# Patient Record
Sex: Male | Born: 1955 | Race: Black or African American | Hispanic: No | Smoking: Former smoker
Health system: Southern US, Community
[De-identification: ages and names within clinical notes are randomized; demographics above are authoritative.]

## PROBLEM LIST (undated history)

## (undated) DIAGNOSIS — I10 Essential (primary) hypertension: Secondary | ICD-10-CM

## (undated) DIAGNOSIS — J449 Chronic obstructive pulmonary disease, unspecified: Secondary | ICD-10-CM

## (undated) DIAGNOSIS — I720 Aneurysm of carotid artery: Secondary | ICD-10-CM

## (undated) DIAGNOSIS — I509 Heart failure, unspecified: Secondary | ICD-10-CM

## (undated) DIAGNOSIS — S0592XA Unspecified injury of left eye and orbit, initial encounter: Secondary | ICD-10-CM

## (undated) DIAGNOSIS — N182 Chronic kidney disease, stage 2 (mild): Secondary | ICD-10-CM

## (undated) DIAGNOSIS — L409 Psoriasis, unspecified: Secondary | ICD-10-CM

## (undated) DIAGNOSIS — R0600 Dyspnea, unspecified: Secondary | ICD-10-CM

## (undated) DIAGNOSIS — G4730 Sleep apnea, unspecified: Secondary | ICD-10-CM

## (undated) DIAGNOSIS — M4807 Spinal stenosis, lumbosacral region: Secondary | ICD-10-CM

## (undated) DIAGNOSIS — R51 Headache: Secondary | ICD-10-CM

## (undated) DIAGNOSIS — N529 Male erectile dysfunction, unspecified: Secondary | ICD-10-CM

## (undated) DIAGNOSIS — E669 Obesity, unspecified: Secondary | ICD-10-CM

## (undated) DIAGNOSIS — N289 Disorder of kidney and ureter, unspecified: Secondary | ICD-10-CM

## (undated) DIAGNOSIS — J45909 Unspecified asthma, uncomplicated: Secondary | ICD-10-CM

## (undated) HISTORY — DX: Male erectile dysfunction, unspecified: N52.9

## (undated) HISTORY — DX: Unspecified injury of left eye and orbit, initial encounter: S05.92XA

## (undated) HISTORY — DX: Headache: R51

## (undated) HISTORY — DX: Chronic kidney disease, stage 2 (mild): N18.2

## (undated) HISTORY — DX: Psoriasis, unspecified: L40.9

## (undated) HISTORY — DX: Dyspnea, unspecified: R06.00

## (undated) HISTORY — DX: Spinal stenosis, lumbosacral region: M48.07

## (undated) HISTORY — DX: Obesity, unspecified: E66.9

## (undated) HISTORY — DX: Chronic obstructive pulmonary disease, unspecified: J44.9

## (undated) HISTORY — DX: Essential (primary) hypertension: I10

---
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
## 2002-08-16 ENCOUNTER — Emergency Department (HOSPITAL_COMMUNITY): Admission: EM | Admit: 2002-08-16 | Discharge: 2002-08-17 | Payer: Self-pay | Admitting: Emergency Medicine

## 2003-12-23 ENCOUNTER — Emergency Department (HOSPITAL_COMMUNITY): Admission: EM | Admit: 2003-12-23 | Discharge: 2003-12-23 | Payer: Self-pay | Admitting: Emergency Medicine

## 2004-06-15 ENCOUNTER — Emergency Department (HOSPITAL_COMMUNITY): Admission: EM | Admit: 2004-06-15 | Discharge: 2004-06-15 | Payer: Self-pay | Admitting: Emergency Medicine

## 2005-12-11 ENCOUNTER — Emergency Department (HOSPITAL_COMMUNITY): Admission: EM | Admit: 2005-12-11 | Discharge: 2005-12-11 | Payer: Self-pay | Admitting: Emergency Medicine

## 2005-12-12 ENCOUNTER — Ambulatory Visit: Payer: Self-pay | Admitting: Orthopedic Surgery

## 2005-12-12 ENCOUNTER — Inpatient Hospital Stay (HOSPITAL_COMMUNITY): Admission: AD | Admit: 2005-12-12 | Discharge: 2005-12-15 | Payer: Self-pay | Admitting: Orthopedic Surgery

## 2005-12-14 ENCOUNTER — Encounter: Payer: Self-pay | Admitting: Orthopedic Surgery

## 2005-12-14 HISTORY — PX: ORIF PATELLA: SHX5033

## 2005-12-15 ENCOUNTER — Ambulatory Visit: Payer: Self-pay | Admitting: Orthopedic Surgery

## 2005-12-23 ENCOUNTER — Ambulatory Visit: Payer: Self-pay | Admitting: Orthopedic Surgery

## 2006-01-20 ENCOUNTER — Ambulatory Visit: Payer: Self-pay | Admitting: Orthopedic Surgery

## 2006-05-01 ENCOUNTER — Ambulatory Visit: Payer: Self-pay | Admitting: Orthopedic Surgery

## 2006-08-14 ENCOUNTER — Ambulatory Visit: Payer: Self-pay | Admitting: Orthopedic Surgery

## 2006-11-17 ENCOUNTER — Ambulatory Visit: Payer: Self-pay | Admitting: Orthopedic Surgery

## 2007-09-07 ENCOUNTER — Ambulatory Visit: Payer: Self-pay | Admitting: Orthopedic Surgery

## 2007-09-07 DIAGNOSIS — M23302 Other meniscus derangements, unspecified lateral meniscus, unspecified knee: Secondary | ICD-10-CM | POA: Insufficient documentation

## 2007-09-07 DIAGNOSIS — M545 Low back pain: Secondary | ICD-10-CM

## 2007-09-16 ENCOUNTER — Ambulatory Visit (HOSPITAL_COMMUNITY): Admission: RE | Admit: 2007-09-16 | Discharge: 2007-09-16 | Payer: Self-pay | Admitting: Orthopedic Surgery

## 2007-09-25 ENCOUNTER — Emergency Department (HOSPITAL_COMMUNITY): Admission: EM | Admit: 2007-09-25 | Discharge: 2007-09-25 | Payer: Self-pay | Admitting: Emergency Medicine

## 2007-09-28 ENCOUNTER — Ambulatory Visit: Payer: Self-pay | Admitting: Orthopedic Surgery

## 2007-11-29 ENCOUNTER — Inpatient Hospital Stay (HOSPITAL_COMMUNITY): Admission: EM | Admit: 2007-11-29 | Discharge: 2007-12-01 | Payer: Self-pay | Admitting: Emergency Medicine

## 2008-09-05 ENCOUNTER — Encounter (INDEPENDENT_AMBULATORY_CARE_PROVIDER_SITE_OTHER): Payer: Self-pay | Admitting: *Deleted

## 2008-09-05 ENCOUNTER — Observation Stay (HOSPITAL_COMMUNITY): Admission: EM | Admit: 2008-09-05 | Discharge: 2008-09-06 | Payer: Self-pay | Admitting: Emergency Medicine

## 2008-09-06 ENCOUNTER — Ambulatory Visit: Payer: Self-pay | Admitting: Gastroenterology

## 2009-09-06 LAB — CONVERTED CEMR LAB
BUN: 17 mg/dL
Creatinine, Ser: 1.5 mg/dL
TSH: 0.84 microintl units/mL

## 2009-09-12 ENCOUNTER — Ambulatory Visit: Payer: Self-pay | Admitting: Specialist

## 2010-01-25 LAB — CONVERTED CEMR LAB
ALT: 18 units/L
AST: 19 units/L
Albumin: 4 g/dL
Alkaline Phosphatase: 68 units/L
BUN: 13 mg/dL
CO2: 24 meq/L
Calcium: 9.4 mg/dL
Chloride: 102 meq/L
Cholesterol: 172 mg/dL
Creatinine, Ser: 1.27 mg/dL
GFR calc non Af Amer: 63 mL/min
Glomerular Filtration Rate, Af Am: 73 mL/min/{1.73_m2}
Glucose, Bld: 95 mg/dL
HDL: 51 mg/dL
LDL Cholesterol: 101 mg/dL
Potassium: 3.7 meq/L
Sodium: 141 meq/L
Total Protein: 6.7 g/dL
Triglycerides: 101 mg/dL

## 2010-01-26 ENCOUNTER — Ambulatory Visit (HOSPITAL_COMMUNITY)
Admission: RE | Admit: 2010-01-26 | Discharge: 2010-01-26 | Payer: Self-pay | Source: Home / Self Care | Admitting: Nurse Practitioner

## 2010-03-06 LAB — CONVERTED CEMR LAB
Basophils Absolute: 0 10*3/uL
Basophils Relative: 1 %
Eosinophils Absolute: 0.3 10*3/uL
Eosinophils Relative: 6 %
HCT: 42.2 %
Hemoglobin: 14.1 g/dL
Lymphocytes Relative: 32 %
Lymphs Abs: 1.8 10*3/uL
MCHC: 33.4 g/dL
MCV: 91 fL
Monocytes Absolute: 0.4 10*3/uL
Monocytes Relative: 7 %
Neutro Abs: 3 10*3/uL
Platelets: 273 10*3/uL
RBC: 4.62 M/uL
RDW: 13.3 %
WBC: 5.6 10*3/uL

## 2010-03-07 DIAGNOSIS — F17200 Nicotine dependence, unspecified, uncomplicated: Secondary | ICD-10-CM | POA: Insufficient documentation

## 2010-03-07 DIAGNOSIS — J4489 Other specified chronic obstructive pulmonary disease: Secondary | ICD-10-CM | POA: Insufficient documentation

## 2010-03-07 DIAGNOSIS — L408 Other psoriasis: Secondary | ICD-10-CM

## 2010-03-07 DIAGNOSIS — N529 Male erectile dysfunction, unspecified: Secondary | ICD-10-CM

## 2010-03-07 DIAGNOSIS — J449 Chronic obstructive pulmonary disease, unspecified: Secondary | ICD-10-CM

## 2010-03-07 LAB — CONVERTED CEMR LAB: Brain Natriuretic Peptide: 195

## 2010-03-16 ENCOUNTER — Encounter (INDEPENDENT_AMBULATORY_CARE_PROVIDER_SITE_OTHER): Payer: Self-pay | Admitting: *Deleted

## 2010-03-16 ENCOUNTER — Ambulatory Visit
Admission: RE | Admit: 2010-03-16 | Discharge: 2010-03-16 | Payer: Self-pay | Source: Home / Self Care | Attending: Cardiology | Admitting: Cardiology

## 2010-03-19 ENCOUNTER — Encounter: Payer: Self-pay | Admitting: Cardiology

## 2010-03-22 ENCOUNTER — Ambulatory Visit (HOSPITAL_COMMUNITY): Admission: RE | Admit: 2010-03-22 | Payer: Self-pay | Source: Home / Self Care | Admitting: Cardiology

## 2010-03-30 ENCOUNTER — Encounter (HOSPITAL_COMMUNITY): Admission: RE | Admit: 2010-03-30 | Payer: Self-pay | Source: Home / Self Care | Admitting: Cardiology

## 2010-04-04 DIAGNOSIS — I720 Aneurysm of carotid artery: Secondary | ICD-10-CM | POA: Insufficient documentation

## 2010-04-11 ENCOUNTER — Encounter: Payer: Self-pay | Admitting: Cardiology

## 2010-04-12 NOTE — Miscellaneous (Signed)
Summary: Appointment rescheduled.   Primary [name]:  Ninfa Linden, FNP-C: [facility]   History of Present Illness: Appointment was rescheduled.  Chart was updated.  PFT  Procedure date:  09/06/2009  Findings:      FVC-2.83 FEV1-1.07 (64%) FEF 25-75%: 15% of predicted  -  Date:  01/25/2010    BUN: 13    Creatinine: 1.27  Date:  09/06/2009    TSH: 0.84    BUN: 17    Creatinine: 1.5   Allergies: No Known Drug Allergies  Past History:  Past Medical History: Hypertension Dyspnea Aneurysm-left internal carotid artery Tobacco abuse-30 pack years Chronic kidney disease-stage II Chronic obstructive pulmonary disease Obesity Psoriasis Left eye trauma with loss of vision Erectile dysfunction Lumbosacral spine disease-chronic low back pain Headache Sinusitis  Past Surgical History: ORIF-left patella fracture -Dr. 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

## 2010-04-12 NOTE — Letter (Signed)
Summary: [name] (Nuc Med Stress)  [facility]  [address], [place] [address]   Phone: [phone]  Fax: [phone]    Nuclear Medicine 1-Day Stress Test Information Sheet  Re:     [name]   DOB:     1955/05/26 MRN:     [id number] Weight:  Appointment Date: Register at: Appointment Time: Referring MD:  _x__Exercise Stress  __Adenosine   __Dobutamine  __Lexiscan  __Persantine   __Thallium  Urgency: ____1 (next day)   ____2 (one week)    ____3 (PRN)  Patient will receive Follow Up call with results: Patient needs follow-up appointment:  Instructions regarding medication:  How to prepare for your stress test: 1. DO NOT eat or dring 6 hours prior to your arrival time. This includes no caffeine (coffee, tea, sodas, chocolate) if you were instructed to take your medications, drink water with it. 2. DO NOT use any tobacco products for at leaset 8 hours prior to arrival. 3. DO NOT wear dresses or any clothing that may have metal clasps or buttons. 4. Wear short sleeve shirts, loose clothing, and comfortalbe walking shoes. 5. DO NOT use lotions, oils or powder on your chest before the test. 6. The test will take approximately 3-4 hours from the time you arrive until completion. 7. To register the day of the test, go to the Short Stay entrance at [facility]. 8. If you must cancel your test, call [phone] as soon as you are aware. 9. DO NOT EAT OR DRINK AFTER MIDNIGHT THE EVENING BEFORE THE PROCEDURE, AND DO NOT TAKE YOUR AM MEDICATIONS THE DAY OF THE PROCEDURE. After you arrive for test:   When you arrive at [facility], you will go to Short Stay to be registered. They will then send you to Radiology to check in. The Nuclear Medicine Tech will get you and start an IV in your arm or hand. A small amount of a radioactive tracer will then be injected into your IV. This tracer will then have to circulate for 30-45 minutes. During  this time you will wait in the waiting room and you will be able to drink something without caffeine. A series of pictures will be taken of your heart follwoing this waiting period. After the 1st set of pictures you will go to the stress lab to get ready for your stress test. During the stress test, another small amount of a radioactive tracer will be injected through your IV. When the stress test is complete, there is a short rest period while your heart rate and blood pressure will be monitored. When this monitoring period is complete you will have another set of pictrues taken. (The same as the 1st set of pictures). These pictures are taken between 15 minutes and 1 hour after the stress test. The time depends on the type of stress test you had. Your doctor will inform you of your test results within 7 days after test.    The possibilities of certain changes are possible during the test. They include abnormal blood pressure and disorders of the heart. Side effects of persantine or adenosine can include flushing, chest pain, shortness of breath, stomach tightness, headache and light-headedness. These side effects usually do not last long and are self-resolving. Every effort will be made to keep you comfortable and to minimize complications by obtaining a medical history and by close observation during the test. Emergency equipment, medications, and trained personnel are available to  deal with any unusual situation which may arise.  Please notify office at least 48 hours in advance if you are unable to keep this appt.

## 2010-04-12 NOTE — Assessment & Plan Note (Addendum)
Summary: ***per Dr.Kathy Jarold Motto for uncontrolled HTN worsening dysn...   Visit Type:  Initial Consult Primary Provider:  Ninfa Linden, FNP-C: [facility]   History of Present Illness: [name] was evaluated in the office today at the kind request of Ms. Ninfa Linden for class 3+ dyspnea on exertion and for what is described as resistant hypertension.  Patient has a long history of high blood pressure, but has never been admitted to hospital for same.  He was recently referred to a neurosurgeon at [facility] for evaluation and treatment of an intracranial aneurysm.  He has been seen by a pulmonologist at [facility].  Prior medical records are being sought.  He has long-standing dyspnea on mild-to-moderate exertion, but has had no resting symptoms.  He typically notes no wheezing.  He does snore at night, but has no chronic sputum production nor has he been admitted to hospital for bronchitis or pneumonia.  Unfortunately, he continues smoke1/3 pack per day of cigarettes with a 40 pack year consumption overall.  Current Medications (verified): 1)  Lisinopril-Hydrochlorothiazide 20-12.5 Mg Tabs (Lisinopril-Hydrochlorothiazide) .... Take 2 Tabs Daily 2)  Amoxicillin-Pot Clavulanate 875-125 Mg Tabs (Amoxicillin-Pot Clavulanate) .... Take 1 Tab Two Times A Day 3)  Etodolac 500 Mg Tabs (Etodolac) .... Take 1 Tab Two Times A Day 4)  Orphenadrine Citrate Cr 100 Mg Xr12h-Tab (Orphenadrine Citrate) .... Take 1 Tab Two Times A Day 5)  Advair Diskus 250-50 Mcg/dose Aepb (Fluticasone-Salmeterol) .... Take 1 Puff Two Times A Day 6)  Ventolin Hfa 108 (90 Base) Mcg/act Aers (Albuterol Sulfate) .... Use As Needed 7)  Amlodipine Besylate 5 Mg Tabs (Amlodipine Besylate) .... Take One Tablet By Mouth Daily  Allergies (verified): No Known Drug Allergies  Comments:  Nurse/Medical Assistant: [facility]s  Past History:  Past Surgical History: Last  updated: 03/07/2010 ORIF-left patella fracture -Dr. 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; seeking disability for back injury Marital-divorced; resides with fianc; no children Tobacco Use - Yes:30 pack years  Alcohol Use - denies excessive use Regular Exercise - no Drug Use - no  Past Medical History: Hypertension Dyspnea Aneurysm-left internal carotid artery Tobacco abuse-40 pack years Chronic kidney disease-stage II Chronic obstructive pulmonary disease Obesity Psoriasis Left eye trauma with loss of vision Erectile dysfunction Lumbosacral spine disease-chronic low back pain Headache Sinusitis  Family History:  Mother-cardiac disease; died following noncardiac surgery Siblings: 2 of 3 brothers are deceased as a result of neoplastic disease; one of 4 sisters died due to trauma from an MVC Positive family history-neoplastic disease; hypertension; cirrhosis  EKG  Procedure date:  03/16/2010  Findings:      Sinus tachycardia at a rate of 102 BPM Borderline left and right atrial enlargement Nondiagnostic inferior Q waves ST-T wave abnormalities consistent with LVH or inferolateral ischemia No previous tracing for comparison.  -  Date:  03/07/2010    BNP: 195   Review of Systems       See HPI.  Vital Signs:  Patient profile:   55 year old male Height:      69 inches Weight:      214 pounds  BMI:     31.72 O2 Sat:      95 % on Room air Pulse rate:   175 / minute BP sitting:   142 / 83  (right arm)  Vitals Entered By:  Dreama Saa, CNA (March 16, 2010 1:54 PM)  O2 Flow:  Room air  Physical Exam  General:  Moderately overweight; well-developed; no acute distress: HEENT-[place]/AT; PERRL; EOM intact; conjunctiva and lids nl:  Neck-No JVD; no carotid bruits: Endocrine-No thyromegaly: Lungs-No tachypnea, clear without rales, rhonchi or wheezes; prolonged expiratory phase; increased A-P diameter CV-normal PMI; normal S1 and S2:;  Abdomen-BS normal; soft and non-tender without masses or organomegaly: MS-No deformities, cyanosis or clubbing: Neurologic-Nl cranial nerves; symmetric strength and tone: Skin- Warm, no sig. lesions: Extremities-Nl distal pulses; no edema    Impression & Recommendations:  Problem # 1:  HYPERTENSION (ICD-401.1) Blood pressure control is marginal with values reported by the patient as having been obtained at home with a wrist cuff.  Amlodipine 5 mg q.d. will be added to his antihypertensive regime.  Problem # 2:  CHRONIC OBSTRUCTIVE PULMONARY DISEASE (ICD-496) Chronic obstructive pulmonary disease is the likely explanation for his exertional dyspnea.  Certainly, cardiac problems, if any, could be contributing.  Echocardiogram and nuclear stress test will be performed.  Her records will be obtained from his previous healthcare providers.  I will reassess this nice gentleman in one month.  In the interim, he will be asked to monitor blood pressure at home.  Other Orders: Nuclear Stress Test (Nuc Stress Test) 2-D Echocardiogram (2D Echo)  Patient Instructions: 1)  Your physician recommends that you schedule a follow-up appointment in: 1 month 2)  Your physician has requested that you have an echocardiogram.  Echocardiography is a painless test that uses sound waves to create images of your heart. It provides your doctor with information about the size and shape of your heart and how well your heart's chambers and valves are working.  This procedure takes approximately one hour.  There are no restrictions for this procedure. 3)  Your physician has requested that you have an exercise stress myoview.  For further information please visit [email or web address]  Please follow instruction sheet, as given. 4)  Your physician has requested that you regularly monitor and record your blood pressure readings at home.  Please use the same machine at the same time of day to check your readings and record them to bring to your follow-up visit. Prescriptions: AMLODIPINE BESYLATE 5 MG TABS (AMLODIPINE BESYLATE) Take one tablet by mouth daily  #30 x 3   Entered by:   Teressa Lower RN   Authorized by:   Kathlen Brunswick, MD, [facility]   Signed by:   Teressa Lower RN on 03/16/2010   Method used:   Electronically to        [facility], [facility] (retail)       [address]       [place], [place]  [address]       Ph: [phone]       Fax: [phone]   RxID:   [id number]   Appended Document: [facility] Neurosurgery Records    Clinical Lists Changes  Problems: Changed problem from ANEURYSM - LICA (ICD-442.81) to ANEURYSM - LICA (ICD-442.81) - 4 mm; supraclinoid; angiography planned. 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 exam; cerebral angio planned Tobacco abuse-40 pack  years Chronic kidney disease-stage II Chronic obstructive pulmonary disease Obesity Psoriasis Left eye trauma with loss of vision Erectile dysfunction Lumbosacral spine disease-chronic low back pain Headache Sinusitis (04/04/2010 18:58) Added new observation of REFERRING MD: Neurosurgery-Dr. Marliss Czar, [facility] (04/04/2010 18:58) Added new observation of PRIMARY MD: Ninfa Linden, FNP-C: [facility] (04/04/2010 18:58)        Past History:  Past Medical History: Hypertension Dyspnea Aneurysm-01/2010: 4 mm left supraclinoid internal carotid  artery; diplopia, nl. 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 exam; cerebral angio planned Tobacco abuse-40 pack years Chronic kidney disease-stage II Chronic obstructive pulmonary disease: Moderate by spirometry in 08/2009; FEV1 of 1.1; nl alpha-1 antitrypsin Obesity-snores Psoriasis Left eye trauma with loss of vision Erectile dysfunction Lumbosacral spine disease-chronic low back pain Headache Sinusitis (04/04/2010 21:27) Added new observation of REFERRING MD: Neurosurgery-Dr. 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 exam; cerebral angio planned Tobacco abuse-40 pack years Chronic kidney disease-stage II Chronic obstructive pulmonary disease: Moderate by spirometry in 08/2009; FEV1 of 1.1; nl alpha-1 antitrypsin Obesity-snores Psoriasis Left eye trauma with loss of vision Erectile dysfunction Lumbosacral spine disease-chronic low back pain Headache Sinusitis

## 2010-04-13 ENCOUNTER — Encounter: Payer: Self-pay | Admitting: Cardiology

## 2010-04-13 ENCOUNTER — Telehealth (INDEPENDENT_AMBULATORY_CARE_PROVIDER_SITE_OTHER): Payer: Self-pay

## 2010-04-13 ENCOUNTER — Encounter (INDEPENDENT_AMBULATORY_CARE_PROVIDER_SITE_OTHER): Payer: Self-pay | Admitting: *Deleted

## 2010-04-14 ENCOUNTER — Emergency Department (HOSPITAL_COMMUNITY)
Admission: EM | Admit: 2010-04-14 | Discharge: 2010-04-14 | Disposition: A | Discharge status: Nursing Home (Includes ICF) | Payer: Medicare Other | Source: Home / Self Care | Attending: Emergency Medicine | Admitting: Emergency Medicine

## 2010-04-14 ENCOUNTER — Inpatient Hospital Stay (HOSPITAL_COMMUNITY)
Admission: EM | Admit: 2010-04-14 | Discharge: 2010-04-17 | DRG: 313 | Disposition: A | Discharge status: Home / Self Care | Payer: Medicare Other | Source: Other Acute Inpatient Hospital | Attending: Cardiology | Admitting: Cardiology

## 2010-04-14 ENCOUNTER — Emergency Department (HOSPITAL_COMMUNITY): Admit: 2010-04-14 | Discharge: 2010-04-14 | Disposition: A | Discharge status: Home / Self Care | Payer: Medicare Other

## 2010-04-14 ENCOUNTER — Inpatient Hospital Stay: Admit: 2010-04-14 | Payer: Self-pay

## 2010-04-14 DIAGNOSIS — F17200 Nicotine dependence, unspecified, uncomplicated: Secondary | ICD-10-CM | POA: Insufficient documentation

## 2010-04-14 DIAGNOSIS — R05 Cough: Secondary | ICD-10-CM | POA: Insufficient documentation

## 2010-04-14 DIAGNOSIS — R059 Cough, unspecified: Secondary | ICD-10-CM | POA: Insufficient documentation

## 2010-04-14 DIAGNOSIS — H5440 Blindness, one eye, unspecified eye: Secondary | ICD-10-CM | POA: Diagnosis present

## 2010-04-14 DIAGNOSIS — J4489 Other specified chronic obstructive pulmonary disease: Secondary | ICD-10-CM | POA: Diagnosis present

## 2010-04-14 DIAGNOSIS — G8929 Other chronic pain: Secondary | ICD-10-CM | POA: Diagnosis present

## 2010-04-14 DIAGNOSIS — L408 Other psoriasis: Secondary | ICD-10-CM | POA: Diagnosis present

## 2010-04-14 DIAGNOSIS — R0609 Other forms of dyspnea: Secondary | ICD-10-CM | POA: Insufficient documentation

## 2010-04-14 DIAGNOSIS — R0602 Shortness of breath: Secondary | ICD-10-CM | POA: Insufficient documentation

## 2010-04-14 DIAGNOSIS — N289 Disorder of kidney and ureter, unspecified: Secondary | ICD-10-CM | POA: Diagnosis present

## 2010-04-14 DIAGNOSIS — I10 Essential (primary) hypertension: Secondary | ICD-10-CM | POA: Insufficient documentation

## 2010-04-14 DIAGNOSIS — M545 Low back pain, unspecified: Secondary | ICD-10-CM | POA: Diagnosis present

## 2010-04-14 DIAGNOSIS — I129 Hypertensive chronic kidney disease with stage 1 through stage 4 chronic kidney disease, or unspecified chronic kidney disease: Secondary | ICD-10-CM | POA: Diagnosis present

## 2010-04-14 DIAGNOSIS — N182 Chronic kidney disease, stage 2 (mild): Secondary | ICD-10-CM | POA: Diagnosis present

## 2010-04-14 DIAGNOSIS — R61 Generalized hyperhidrosis: Secondary | ICD-10-CM | POA: Insufficient documentation

## 2010-04-14 DIAGNOSIS — R0789 Other chest pain: Principal | ICD-10-CM | POA: Diagnosis present

## 2010-04-14 DIAGNOSIS — Z79899 Other long term (current) drug therapy: Secondary | ICD-10-CM

## 2010-04-14 DIAGNOSIS — N529 Male erectile dysfunction, unspecified: Secondary | ICD-10-CM | POA: Diagnosis present

## 2010-04-14 DIAGNOSIS — M79609 Pain in unspecified limb: Secondary | ICD-10-CM | POA: Insufficient documentation

## 2010-04-14 DIAGNOSIS — I720 Aneurysm of carotid artery: Secondary | ICD-10-CM | POA: Diagnosis present

## 2010-04-14 DIAGNOSIS — R079 Chest pain, unspecified: Secondary | ICD-10-CM

## 2010-04-14 DIAGNOSIS — J449 Chronic obstructive pulmonary disease, unspecified: Secondary | ICD-10-CM | POA: Insufficient documentation

## 2010-04-14 DIAGNOSIS — R0989 Other specified symptoms and signs involving the circulatory and respiratory systems: Secondary | ICD-10-CM | POA: Insufficient documentation

## 2010-04-14 LAB — CBC
HCT: 42.1 % (ref 39.0–52.0)
HCT: 41 % (ref 39.0–52.0)
Hemoglobin: 14.8 g/dL (ref 13.0–17.0)
Hemoglobin: 13.9 g/dL (ref 13.0–17.0)
MCH: 30.8 pg (ref 26.0–34.0)
MCH: 30.5 pg (ref 26.0–34.0)
MCHC: 35.2 g/dL (ref 30.0–36.0)
MCHC: 33.9 g/dL (ref 30.0–36.0)
MCV: 87.5 fL (ref 78.0–100.0)
MCV: 90.1 fL (ref 78.0–100.0)
Platelets: 260 10*3/uL (ref 150–400)
RBC: 4.81 MIL/uL (ref 4.22–5.81)
RBC: 4.55 MIL/uL (ref 4.22–5.81)
RDW: 12.7 % (ref 11.5–15.5)
WBC: 6.1 10*3/uL (ref 4.0–10.5)

## 2010-04-14 LAB — BASIC METABOLIC PANEL
BUN: 13 mg/dL (ref 6–23)
BUN: 13 mg/dL (ref 6–23)
CO2: 27 mEq/L (ref 19–32)
Calcium: 8.9 mg/dL (ref 8.4–10.5)
Chloride: 101 mEq/L (ref 96–112)
GFR calc non Af Amer: 44 mL/min — ABNORMAL LOW (ref >60)
GFR calc non Af Amer: 51 mL/min — ABNORMAL LOW (ref >60)
Glucose, Bld: 86 mg/dL (ref 70–99)
Glucose, Bld: 107 mg/dL — ABNORMAL HIGH (ref 70–99)
Potassium: 3.3 mEq/L — ABNORMAL LOW (ref 3.5–5.1)
Potassium: 3.6 mEq/L (ref 3.5–5.1)
Sodium: 137 mEq/L (ref 135–145)
Sodium: 136 mEq/L (ref 135–145)

## 2010-04-14 LAB — POCT CARDIAC MARKERS
CKMB, poc: 2.9 ng/mL (ref 1.0–8.0)
Myoglobin, poc: 134 ng/mL (ref 12–200)
Troponin i, poc: <0.05 ng/mL (ref 0.00–0.09)

## 2010-04-14 LAB — DIFFERENTIAL
Basophils Absolute: 0 10*3/uL (ref 0.0–0.1)
Basophils Relative: 1 % (ref 0–1)
Eosinophils Absolute: 0.3 10*3/uL (ref 0.0–0.7)
Eosinophils Relative: 6 % — ABNORMAL HIGH (ref 0–5)
Lymphocytes Relative: 32 % (ref 12–46)
Lymphs Abs: 2 10*3/uL (ref 0.7–4.0)
Monocytes Absolute: 0.5 10*3/uL (ref 0.1–1.0)
Monocytes Relative: 8 % (ref 3–12)
Neutro Abs: 3.3 10*3/uL (ref 1.7–7.7)
Neutrophils Relative %: 53 % (ref 43–77)

## 2010-04-14 LAB — TROPONIN I: Troponin I: 0.02 ng/mL (ref 0.00–0.06)

## 2010-04-14 LAB — BRAIN NATRIURETIC PEPTIDE: Pro B Natriuretic peptide (BNP): 80 pg/mL (ref 0.0–100.0)

## 2010-04-14 LAB — CK TOTAL AND CKMB (NOT AT ARMC): Relative Index: 2.5 (ref 0.0–2.5)

## 2010-04-15 LAB — LIPID PANEL
Cholesterol: 152 mg/dL (ref 0–200)
HDL: 42 mg/dL (ref >39)
LDL Cholesterol: 91 mg/dL (ref 0–99)

## 2010-04-15 LAB — CARDIAC PANEL(CRET KIN+CKTOT+MB+TROPI)
CK, MB: 3.6 ng/mL (ref 0.3–4.0)
Relative Index: 2.4 (ref 0.0–2.5)
Total CK: 143 U/L (ref 7–232)
Total CK: 131 U/L (ref 7–232)
Troponin I: 0.02 ng/mL (ref 0.00–0.06)

## 2010-04-15 LAB — HEPARIN LEVEL (UNFRACTIONATED)
Heparin Unfractionated: 0.1 IU/mL — ABNORMAL LOW (ref 0.30–0.70)
Heparin Unfractionated: <0.1 IU/mL — ABNORMAL LOW (ref 0.30–0.70)

## 2010-04-15 LAB — TSH: TSH: 0.673 u[IU]/mL (ref 0.350–4.500)

## 2010-04-15 LAB — CBC
Hemoglobin: 12.9 g/dL — ABNORMAL LOW (ref 13.0–17.0)
MCHC: 33.6 g/dL (ref 30.0–36.0)
WBC: 5.6 10*3/uL (ref 4.0–10.5)

## 2010-04-15 LAB — DIFFERENTIAL
Basophils Absolute: 0 10*3/uL (ref 0.0–0.1)
Basophils Relative: 1 % (ref 0–1)
Lymphocytes Relative: 34 % (ref 12–46)
Monocytes Absolute: 0.3 10*3/uL (ref 0.1–1.0)
Neutro Abs: 3 10*3/uL (ref 1.7–7.7)
Neutrophils Relative %: 53 % (ref 43–77)

## 2010-04-16 DIAGNOSIS — R079 Chest pain, unspecified: Secondary | ICD-10-CM

## 2010-04-16 LAB — POCT I-STAT 3, VENOUS BLOOD GAS (G3P V)
O2 Saturation: 53 %
TCO2: 27 mmol/L (ref 0–100)
pCO2, Ven: 42.6 mmHg — ABNORMAL LOW (ref 45.0–50.0)

## 2010-04-16 LAB — DIFFERENTIAL
Lymphocytes Relative: 31 % (ref 12–46)
Monocytes Absolute: 0.5 10*3/uL (ref 0.1–1.0)
Monocytes Relative: 8 % (ref 3–12)
Neutro Abs: 3.4 10*3/uL (ref 1.7–7.7)

## 2010-04-16 LAB — CBC
HCT: 37 % — ABNORMAL LOW (ref 39.0–52.0)
Hemoglobin: 12.3 g/dL — ABNORMAL LOW (ref 13.0–17.0)
MCH: 30 pg (ref 26.0–34.0)
MCHC: 33.2 g/dL (ref 30.0–36.0)
MCV: 90.2 fL (ref 78.0–100.0)

## 2010-04-16 LAB — BASIC METABOLIC PANEL
BUN: 8 mg/dL (ref 6–23)
CO2: 27 mEq/L (ref 19–32)
Chloride: 104 mEq/L (ref 96–112)
Creatinine, Ser: 1.32 mg/dL (ref 0.4–1.5)
Glucose, Bld: 104 mg/dL — ABNORMAL HIGH (ref 70–99)

## 2010-04-16 LAB — POCT ACTIVATED CLOTTING TIME: Activated Clotting Time: 134 seconds

## 2010-04-17 ENCOUNTER — Ambulatory Visit: Payer: Self-pay | Admitting: Cardiology

## 2010-04-17 LAB — BASIC METABOLIC PANEL
BUN: 4 mg/dL — ABNORMAL LOW (ref 6–23)
Chloride: 105 mEq/L (ref 96–112)
GFR calc non Af Amer: >60 mL/min (ref >60)
Potassium: 3.5 mEq/L (ref 3.5–5.1)
Sodium: 139 mEq/L (ref 135–145)

## 2010-04-17 LAB — POCT I-STAT 3, VENOUS BLOOD GAS (G3P V)
Bicarbonate: 26.7 mEq/L — ABNORMAL HIGH (ref 20.0–24.0)
TCO2: 28 mmol/L (ref 0–100)
pH, Ven: 7.43 — ABNORMAL HIGH (ref 7.250–7.300)
pO2, Ven: 32 mmHg (ref 30.0–45.0)

## 2010-04-17 LAB — POCT I-STAT 3, ART BLOOD GAS (G3+)
Acid-Base Excess: 1 mmol/L (ref 0.0–2.0)
Bicarbonate: 24.8 mEq/L — ABNORMAL HIGH (ref 20.0–24.0)
O2 Saturation: 94 %
TCO2: 26 mmol/L (ref 0–100)
pO2, Arterial: 68 mmHg — ABNORMAL LOW (ref 80.0–100.0)

## 2010-04-17 LAB — CBC
Hemoglobin: 13.8 g/dL (ref 13.0–17.0)
MCH: 30.2 pg (ref 26.0–34.0)
MCHC: 33.7 g/dL (ref 30.0–36.0)

## 2010-04-18 NOTE — Letter (Signed)
Summary: [name]  [facility]   Imported By: Faythe Ghee 04/13/2010 11:07:48  _____________________________________________________________________  External Attachment:    Type:   Image     Comment:   External Document

## 2010-04-18 NOTE — Miscellaneous (Signed)
Summary: LABS TSH,A1C,09/05/2008  Clinical Lists Changes  Observations: Added new observation of TSH: 0.555 microintl units/mL (09/05/2008 10:30) Added new observation of HGBA1C: 5.6 % (09/05/2008 10:30)

## 2010-04-23 NOTE — Discharge Summary (Signed)
NAMENEAL, [name]           ACCOUNT NO.:  [id number]  MEDICAL RECORD NO.:  [id number]           PATIENT TYPE:  I  LOCATION:  2005                         FACILITY:  [facility]  PHYSICIAN:  Verne Carrow, MDDATE OF BIRTH:  May 07, 1955  DATE OF ADMISSION:  04/14/2010 DATE OF DISCHARGE:  04/17/2010                              DISCHARGE SUMMARY   PRIMARY CARDIOLOGIST:  Gerrit Friends. Dietrich Pates, MD, [facility].  PRIMARY CARE PROVIDER:  Ninfa Linden, Family Nurse Practitioner in [place].  DISCHARGE DIAGNOSIS:  Chest pain without objective evidence of ischemia.  SECONDARY DIAGNOSES: 1. Hypertension. 2. Tobacco abuse. 3. Acute-on-chronic stage II kidney disease. 4. Chronic obstructive pulmonary disease. 5. Erectile dysfunction. 6. Chronic low back pain. 7. History of left eye trauma and blindness. 8. Psoriasis. 9. 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 10.Status post open reduction and internal fixation for the left     patella fracture.  ALLERGIES:  No known drug allergies.  PROCEDURES:  Left heart cardiac catheterization performed on April 16, 2010, showing normal coronary arteries.  EF of 55% to 60%.  HISTORY OF PRESENT ILLNESS:  This is a 55 year old African American male with the above problem list, who was in his usual state of health until the night prior to admission when he had sudden onset of substernal chest discomfort that was sharp in nature and left-sided lasting several minutes at a time and resolving spontaneously.  On the morning of admission, he had recurrent discomfort which was more moderate in intensity and associated with left arm discomfort prompting to present [facility].  There, he had lateral T-wave inversions which were unchanged from prior ECGs and point-of-care markers were negative.  He was transferred to [facility] for further evaluation.  Of note, his chest pain resolved with sublingual nitrates.  HOSPITAL COURSE:  The patient  ruled out for MI and had no further chest discomfort.  He was noted to have an elevation of his creatinine to 1.62; and secondary to this, we held his lisinopril as well as his hydrochlorothiazide  The patient had no further chest pain and it was felt that he would require cardiac catheterization given long-standing risk factors.  He was hydrated pre-catheterization and diagnostic catheterization was performed on April 16, 2010, showing normal coronary arteries with normal LV function.  His creatinine this morning has remained stable at 1.24.  His D-dimer is normal at less than 0.22. He has been ambulating without difficulty and has had no further chest pain.  Mr. Crocket has been hypertensive throughout his admission.  He was hypertensive on admission despite his home doses of lisinopril and hydrochlorothiazide, which have since been discontinued.  We have initiated carvedilol and Norvasc therapy and these may require outpatient titration.  With his history of renal disease or chronic kidney disease, he would likely benefit from reinitiation of ACE inhibitor in an outpatient setting.  Outpatient setting provided to ability of creatinine.  The patient will be discharged home today in good condition.  DISCHARGE LABS:  Hemoglobin 13.8, hematocrit 41.0, WBC 5.5, platelets 244,000  Sodium 139, potassium 3.5, chloride 105, CO2 27, BUN 4, creatinine 1.24, glucose 92, calcium 8.9, CK 131, MB  3.2, troponin-I 0.02, total cholesterol 152, triglycerides 95, HDL 42, LDL 91.  DISPOSITION:  The patient will be discharged home today in good condition.  FOLLOWUP PLANS AND APPOINTMENTS:  The patient should follow up with primary care provider, Ninfa Linden in [place] in the next 1 to 2 weeks.  DISCHARGE MEDICATIONS: 1. Amlodipine 10 mg daily. 2. Carvedilol 12.5 mg b.i.d. 3. Advair Diskus 250/50, 1 puff b.i.d. 4. Augmentin 875 mg b.i.d. which was started prior to hospitalization      and the patient has completed outpatient course. 5. Etodolac 500 mg b.i.d. 6. Orphenadrine citrate 100 mg b.i.d. 7. Ventolin inhaler 1 to 2 puffs q.4 h. p.r.n.  OUTSTANDING LABORATORY STUDIES:  None.  DURATION OF DISCHARGE/ENCOUNTER:  Sixty minutes including physician time.     Nicolasa Ducking, ANP   ______________________________ Verne Carrow, MD    CB/MEDQ  D:  04/17/2010  T:  04/18/2010  Job:  [id number]  cc:   Ninfa Linden, FNP  Electronically Signed by Nicolasa Ducking ANP on 04/23/2010 12:04:50 PM Electronically Signed by Verne Carrow MD on 04/23/2010 03:40:32 PM

## 2010-04-26 NOTE — Progress Notes (Signed)
**Note De-Identified Renie Stelmach Obfuscation** Summary: Echo and stress test  Phone Note Outgoing Call   Call placed by: Larita Fife Kela Baccari LPN,  April 13, 2010 10:29 AM Summary of Call: [facility]. Pt. is scheduled for OV with Dr. Dietrich Pates on Tuesday 04-17-10 but canceled Echo and stress test (was to reschedule but did not), trying to reach pt. to reschedule test and OV.  Initial call taken by: Larita Fife Alyra Patty LPN,  April 13, 2010 10:33 AM  Follow-up for Phone Call        Echo results received from [facility] and scanned into chart. Awaiting stress test results or for pt. to reschedule. Follow-up by: Larita Fife Lailoni Baquera LPN,  April 13, 2010 10:52 AM  Additional Follow-up for Phone Call Additional follow up Details #1::        Pt. admitted to [facility] hosp on 04-14-2010 with sob.     Appended Document: Echo and stress test sent [facility] desktop a note to r/c stress test

## 2010-05-02 NOTE — Procedures (Signed)
[name], [name]           ACCOUNT NO.:  [id number]  MEDICAL RECORD NO.:  [id number]           PATIENT TYPE:  I  LOCATION:  2005                         FACILITY:  [facility]  PHYSICIAN:  Arturo Morton. Riley Kill, MD, FACCDATE OF BIRTH:  Aug 21, 1955  DATE OF PROCEDURE: DATE OF DISCHARGE:  04/17/2010                           CARDIAC CATHETERIZATION   The patient is a 55 year old gentleman presenting with shortness of breath and chest pain.  The patient was seen in consultation and subsequently set up for right and left heart catheterization.  The patient was agreeable to proceed.  DESCRIPTION OF PROCEDURE:  The procedure was performed from the right femoral artery and right femoral vein.  A 7-French sheath was used for a femoral vein entry and a thermodilution Swan-Ganz catheter was utilized, 5-French sheath was used for the femoral artery and standard Judkins catheters utilized.  There were no major complications.  He was taken to the holding area in satisfactory clinical condition after completion of the procedure.  HEMODYNAMIC DATA: 1. Right atrial pressure 8. 2. RV 32/10. 3. Pulmonary artery 28/13, mean 22. 4. Pulmonary capillary wedge 13. 5. Aortic 142/89, mean 110. 6. LV 145/14. 7. No gradient pullback across aortic valve. 8. Superior vena cava saturation 53%. 9. Pulmonary artery saturation 63%. 10.Aortic saturation 94%. 11.Thermodilution cardiac output 4.7 L per minute. 12.Thermodilution cardiac index 2.2 L per minute per meter squared.  ANGIOGRAPHIC DATA: 1. Ventriculography was done in the RAO projection.  Overall systolic     function appeared preserved, but because of ventricular ectopy it     was difficult to calculate ejection fraction, and it was difficult     to analyze wall motion per se because of the ventricular ectopy,     however, overall systolic function appeared preserved. 2. The coronary arteries were large and patulous. 3. The left main is really  quite large, free of critical disease. 4. The LAD courses to the apex.  There are multiple diagonal branches.     The first of which has some septal branches.  The LAD is an     extremely large caliber vessel coursing down to the apex, neither     the LAD nor the diagonals have significant focal disease.  There     may be some 30% mild plaquing in the distal LAD but given the large     caliber of the vessel, it is not felt to be hemodynamically     significant. 5. The AV circumflex is really quite small supplies small     posterolateral territory. 6. The right coronary artery is a giant coronary vessel supplying a     posterior descending and posterolateral system, all of which are     without significant focal obstruction.  IMPRESSION: 1. Relatively normal right heart pressures with mild increase in     systemic pressure. 2. Preserved overall left ventricular function. 3. No evidence of high-grade coronary artery obstruction. 4. Current right and left heart pressures that are within the normal     range.  DISPOSITION:  The patient was transported back to 2000 in satisfactory clinical condition.     Arturo Morton.  Riley Kill, MD, [facility]     TDS/MEDQ  D:  04/25/2010  T:  04/26/2010  Job:  [id number]  cc:   Rollene Rotunda, MD, [facility] CV Laboratory  Electronically Signed by Shawnie Pons MD [facility] on 05/02/2010 09:10:02 PM

## 2010-05-24 NOTE — H&P (Signed)
[name], [name]           ACCOUNT NO.:  [id number]  MEDICAL RECORD NO.:  [id number]           PATIENT TYPE:  LOCATION:                                 FACILITY:  PHYSICIAN:  Rollene Rotunda, MD, FACCDATE OF BIRTH:  [birth year]  DATE OF ADMISSION: DATE OF DISCHARGE:                             HISTORY & PHYSICAL   PRIMARY CARE PHYSICIAN:  Ninfa Linden, family nurse practitioner.  CARDIOLOGIST:  Gerrit Friends. Dietrich Pates, MD, [facility]  REASON FOR PRESENTATION:  Evaluate the patient for chest pain.  HISTORY OF PRESENT ILLNESS:  The patient is a pleasant 55 year old African American gentleman without a prior cardiac history.  He has had apparently difficult to control hypertension.  He recently saw Dr. Dietrich Pates for evaluation of an abnormal EKG and dyspnea.  He has longstanding smoking and apparent chronic lung disease though he has not seen a lung doctor.  However, it was thought there might be a cardiac etiology to his dyspnea.  Dr. Dietrich Pates did order an echocardiogram and a stress perfusion study, which have not yet been done.  He also started the patient on Norvasc though he has not yet had this prescription filled.  The patient was in his usual state of health which includes dyspnea, walking a short distance on level ground.  However last night, he had chest discomfort.  It was left-sided.  It was sharp.  It lasted for several minutes coming spontaneously and going away spontaneously.  He had some increasing dyspnea with this, but no other associated symptoms. This morning, the discomfort returned in similar fashion and was moderate in intensity.  He had not had this before.  There was some left arm discomfort.  He apparently had when he went to [facility]. His EKG demonstrated chronic lateral T-wave inversions unchanged from previous.  Point of care markers x2 were negative.  However, he did have relief with nitroglycerin sublingual.  He was also given  IV nitroglycerin, morphine, and Vicodin.  He is currently pain free.  Otherwise, he has not been particularly active because of increasing dyspnea.  He was walking a couple of miles per his report daily up until the last few weeks.  He has had no recent fevers, chills, or cough.  He has had no PND or orthopnea.  He denies any palpitations, presyncope, or syncope.  PAST MEDICAL HISTORY: 1. 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 2. Tobacco abuse. 3. Renal insufficiency (the patient was not aware of this). 4. Chronic longstanding lung disease. 5. Erectile dysfunction. 6. Chronic low back pain. 7. Left eye trauma and blindness. 8. Psoriasis.  PAST SURGICAL HISTORY: 1. ORIF, left patellar fracture. 2. Eye surgery.  ALLERGIES:  None.  MEDICATIONS:  (The patient does not recall.  I reviewed these from the office records). 1. Lisinopril HCT 40/25 daily. 2. Albuterol. 3. Advair. 4. Amlodipine 5 mg daily (not yet started).5. Orphenadrine 100 mg b.i.d. 6. Etodolac.  FAMILY HISTORY:  Noncontributory for early coronary artery disease.  SOCIAL HISTORY:  The patient is unemployed.  He  is divorced.  He lives with his girlfriend.  He has been smoking for greater than 30 pack years.  REVIEW OF SYSTEMS:  As stated in the HPI, negative for all other systems.  PHYSICAL EXAMINATION:  GENERAL:  The patient is pleasant, and in no distress. VITAL SIGNS:  Blood pressure 157/105, heart rate 89 and regular, respiratory rate 16, and afebrile. HEENT:  Eyelids unremarkable, slightly disconjugate gaze, opacification of his cornea, right pupil is round and reactive, oral mucosa unremarkable, edentulous with upper and lower dentures. NECK:  No jugular venous distention at 45 degrees, carotid upstroke brisk and symmetric.  No bruits.  No thyromegaly. LYMPHATIC:  No cervical, axillary, or  inguinal adenopathy. LUNGS:  Decreased breath sounds bilaterally without wheezing or crackles. BACK:  No costovertebral angle tenderness. CHEST:  Unremarkable. HEART:  PMI not displaced or sustained.  S1 and S2 within normal limits. No S3, no S4.  No clicks.  No rubs.  No murmurs. ABDOMEN:  Flat.  Positive bowel sounds.  Normal frequency and pitch.  No bruits, rebound, guarding, or midline pulsatile mass.  No hepatomegaly or splenomegaly. SKIN:  There is a psoriatic rash at his umbilicus. NEUROLOGIC:  Oriented to person, place, and time.  Cranial nerves II through XII grossly intact.  Motor grossly intact.  IMAGING STUDIES:  EKG, sinus tachycardia, rate 109, QTc prolonged, anterolateral ST-segment changes consistent with repolarization and unchanged from previous.  LABORATORY DATA:  Sodium 137, potassium 3.3, BUN 13, and creatinine 1.62.  WBC 6.1, hemoglobin 14.8, platelets 260.  Point of care markers negative x2.  BNP 80.  Chest x-ray, emphysema with apical bullous changes.  ASSESSMENT AND PLAN: 1. Chest discomfort.  The patient's chest discomfort is typical,     greater than atypical features.  He has significant cardiovascular     risk factors.  Given this, pretest probability of obstructive     coronary artery disease is at least moderately high.  Therefore,     cardiac catheterization is indicated.  I have discussed with him at     length the risks and benefits and he agrees to proceed.  We will     have to decide whether we do an LV gram based on his creatinine     repeat.  He should also get right heart catheterization to evaluate     his dyspnea. 2. Tobacco.  I discussed this with him.  We will get a stop smoking     consult. 3. Hypertension, this is not well controlled.  I will start Norvasc.     I am going to hold his ACE inhibitor and ARB given his renal     insufficiency in upcoming catheterization.  I am going to start a     low-dose beta blockers.  He is not  wheezing.  I will also use     hydralazine for the interim. 4. Renal insufficiency.  He will be hydrated.  I will hold the ACE and     hydrochlorothiazide as above.  We will follow up chemistries. 5. Risk reduction.  I will check a TSH and lipid profile.     Rollene Rotunda, MD, [facility]     JH/MEDQ  D:  04/14/2010  T:  04/14/2010  Job:  [id number]  cc:   Cameron Ali. Dietrich Pates, MD, [facility]  Electronically Signed by Rollene Rotunda MD [facility] on 05/24/2010 09:54:33 AM

## 2010-06-06 ENCOUNTER — Ambulatory Visit: Payer: Medicare Other | Admitting: Physician Assistant

## 2010-06-12 ENCOUNTER — Encounter: Payer: Self-pay | Admitting: Adult Health

## 2010-06-12 ENCOUNTER — Ambulatory Visit: Payer: Medicare Other | Admitting: Adult Health

## 2010-06-18 LAB — BASIC METABOLIC PANEL
BUN: 12 mg/dL (ref 6–23)
Calcium: 9.2 mg/dL (ref 8.4–10.5)
Chloride: 104 mEq/L (ref 96–112)
Creatinine, Ser: 1.42 mg/dL (ref 0.4–1.5)
GFR calc non Af Amer: 52 mL/min — ABNORMAL LOW (ref >60)
GFR calc non Af Amer: 54 mL/min — ABNORMAL LOW (ref >60)
Glucose, Bld: 102 mg/dL — ABNORMAL HIGH (ref 70–99)
Sodium: 136 mEq/L (ref 135–145)

## 2010-06-18 LAB — URINALYSIS, ROUTINE W REFLEX MICROSCOPIC
Bilirubin Urine: NEGATIVE
Glucose, UA: NEGATIVE mg/dL
Hgb urine dipstick: NEGATIVE
Ketones, ur: NEGATIVE mg/dL
Protein, ur: NEGATIVE mg/dL
pH: 6 (ref 5.0–8.0)

## 2010-06-18 LAB — DIFFERENTIAL
Basophils Absolute: 0 10*3/uL (ref 0.0–0.1)
Basophils Relative: 0 % (ref 0–1)
Lymphocytes Relative: 6 % — ABNORMAL LOW (ref 12–46)
Lymphs Abs: 0.4 10*3/uL — ABNORMAL LOW (ref 0.7–4.0)
Lymphs Abs: 1 10*3/uL (ref 0.7–4.0)
Monocytes Absolute: 0.2 10*3/uL (ref 0.1–1.0)
Monocytes Absolute: 0.3 10*3/uL (ref 0.1–1.0)
Monocytes Relative: 5 % (ref 3–12)
Neutro Abs: 5.1 10*3/uL (ref 1.7–7.7)
Neutro Abs: 5.5 10*3/uL (ref 1.7–7.7)

## 2010-06-18 LAB — LIPID PANEL
HDL: 39 mg/dL — ABNORMAL LOW (ref >39)
Total CHOL/HDL Ratio: 4.6 RATIO
Triglycerides: 72 mg/dL (ref <150)
VLDL: 14 mg/dL (ref 0–40)

## 2010-06-18 LAB — CBC
Hemoglobin: 13.8 g/dL (ref 13.0–17.0)
Hemoglobin: 14.7 g/dL (ref 13.0–17.0)
MCHC: 34.8 g/dL (ref 30.0–36.0)
Platelets: 216 10*3/uL (ref 150–400)
RBC: 4.33 MIL/uL (ref 4.22–5.81)
RDW: 13.3 % (ref 11.5–15.5)
RDW: 13.4 % (ref 11.5–15.5)
WBC: 6.1 10*3/uL (ref 4.0–10.5)
WBC: 6.7 10*3/uL (ref 4.0–10.5)

## 2010-06-18 LAB — HEPATIC FUNCTION PANEL
AST: 21 U/L (ref 0–37)
Alkaline Phosphatase: 61 U/L (ref 39–117)
Bilirubin, Direct: 0.2 mg/dL (ref 0.0–0.3)
Total Bilirubin: 0.7 mg/dL (ref 0.3–1.2)

## 2010-06-18 LAB — CK TOTAL AND CKMB (NOT AT ARMC)
CK, MB: 2.5 ng/mL (ref 0.3–4.0)
Relative Index: 1.3 (ref 0.0–2.5)
Relative Index: 1.4 (ref 0.0–2.5)

## 2010-06-18 LAB — TROPONIN I: Troponin I: 0.03 ng/mL (ref 0.00–0.06)

## 2010-06-18 LAB — CARDIAC PANEL(CRET KIN+CKTOT+MB+TROPI)
CK, MB: 2.5 ng/mL (ref 0.3–4.0)
Relative Index: 1.4 (ref 0.0–2.5)
Total CK: 176 U/L (ref 7–232)
Troponin I: 0.03 ng/mL (ref 0.00–0.06)
Troponin I: 0.04 ng/mL (ref 0.00–0.06)

## 2010-06-18 LAB — GLUCOSE, CAPILLARY: Glucose-Capillary: 75 mg/dL (ref 70–99)

## 2010-06-18 LAB — URINE CULTURE: Colony Count: >=100000

## 2010-06-18 LAB — CULTURE, BLOOD (ROUTINE X 2)
Culture: NO GROWTH
Report Status: 7032010

## 2010-06-18 LAB — MAGNESIUM: Magnesium: 1.9 mg/dL (ref 1.5–2.5)

## 2010-07-24 ENCOUNTER — Inpatient Hospital Stay (HOSPITAL_COMMUNITY)
Admission: EM | Admit: 2010-07-24 | Discharge: 2010-07-25 | DRG: 189 | Disposition: A | Discharge status: Home / Self Care | Payer: Medicare Other | Attending: Internal Medicine | Admitting: Internal Medicine

## 2010-07-24 ENCOUNTER — Emergency Department (HOSPITAL_COMMUNITY): Payer: Medicare Other

## 2010-07-24 DIAGNOSIS — F17200 Nicotine dependence, unspecified, uncomplicated: Secondary | ICD-10-CM | POA: Diagnosis present

## 2010-07-24 DIAGNOSIS — J9600 Acute respiratory failure, unspecified whether with hypoxia or hypercapnia: Principal | ICD-10-CM | POA: Diagnosis present

## 2010-07-24 DIAGNOSIS — N182 Chronic kidney disease, stage 2 (mild): Secondary | ICD-10-CM | POA: Diagnosis present

## 2010-07-24 DIAGNOSIS — I129 Hypertensive chronic kidney disease with stage 1 through stage 4 chronic kidney disease, or unspecified chronic kidney disease: Secondary | ICD-10-CM | POA: Diagnosis present

## 2010-07-24 DIAGNOSIS — J441 Chronic obstructive pulmonary disease with (acute) exacerbation: Secondary | ICD-10-CM | POA: Diagnosis present

## 2010-07-24 LAB — BLOOD GAS, ARTERIAL
Delivery systems: POSITIVE
TCO2: 24.3 mmol/L (ref 0–100)
pCO2 arterial: 46.7 mmHg — ABNORMAL HIGH (ref 35.0–45.0)
pH, Arterial: 7.396 (ref 7.350–7.450)

## 2010-07-24 LAB — DIFFERENTIAL
Basophils Absolute: 0 10*3/uL (ref 0.0–0.1)
Basophils Relative: 1 % (ref 0–1)
Eosinophils Absolute: 0.3 10*3/uL (ref 0.0–0.7)
Eosinophils Relative: 6 % — ABNORMAL HIGH (ref 0–5)
Monocytes Absolute: 0.3 10*3/uL (ref 0.1–1.0)

## 2010-07-24 LAB — CARDIAC PANEL(CRET KIN+CKTOT+MB+TROPI)
CK, MB: 5.3 ng/mL — ABNORMAL HIGH (ref 0.3–4.0)
Relative Index: 4.4 — ABNORMAL HIGH (ref 0.0–2.5)
Relative Index: 3.8 — ABNORMAL HIGH (ref 0.0–2.5)
Troponin I: <0.3 ng/mL (ref <0.30)

## 2010-07-24 LAB — BASIC METABOLIC PANEL
BUN: 9 mg/dL (ref 6–23)
CO2: 30 mEq/L (ref 19–32)
Chloride: 101 mEq/L (ref 96–112)
Creatinine, Ser: 1.13 mg/dL (ref 0.4–1.5)
GFR calc Af Amer: >60 mL/min (ref >60)

## 2010-07-24 LAB — CBC
MCHC: 33.3 g/dL (ref 30.0–36.0)
WBC: 5.4 10*3/uL (ref 4.0–10.5)

## 2010-07-24 NOTE — Group Therapy Note (Signed)
[name], [name]           ACCOUNT NO.:  [id number]   MEDICAL RECORD NO.:  [id number]          PATIENT TYPE:  INP   LOCATION:  A328                          FACILITY:  [facility]   PHYSICIAN:  Edward L. Juanetta Gosling, M.D.DATE OF BIRTH:  07/08/1955   DATE OF PROCEDURE:  DATE OF DISCHARGE:                                 PROGRESS NOTE   The patient of the [facility] Hospitalist Team.   SUBJECTIVE:  [name] says he feels well and would like to be able  to go home.  He has no new complaints.   His exam shows pulse 102, temp is 98.8, respirations 20, blood pressure  111/64, and O2 sats 95% on room air.  His chest is much clear and he  looks comfortable.   ASSESSMENT:  He does seem to be better.   PLAN:  If he is able to be discharged, then I will plan to follow him up  in my office, and he will follow with Ninfa Linden, nurse  practitioner.  My plan then would be to send him home on both Spiriva  and Advair or Symbicort.  With either one there has been evidence  recently of an allergy, so that these two together provide a mortality  benefit in COPD.      Edward L. Juanetta Gosling, M.D.  Electronically Signed     ELH/MEDQ  D:  11/30/2007  T:  11/30/2007  Job:  [id number]

## 2010-07-24 NOTE — H&P (Signed)
[name], [name]           ACCOUNT NO.:  [id number]   MEDICAL RECORD NO.:  [id number]          PATIENT TYPE:  INP   LOCATION:  A328                          FACILITY:  [facility]   PHYSICIAN:  Margaretmary Dys, M.D.DATE OF BIRTH:  06/06/1955   DATE OF ADMISSION:  11/28/2007  DATE OF DISCHARGE:  LH                              HISTORY & PHYSICAL   PRIMARY CARE PHYSICIAN:  The patient is unassigned.   ADMISSION DIAGNOSES:  1. Acute exacerbation of COPD.  2. Probably previously undiagnosed COPD.  3. Poorly controlled hypertension.  4. Poor compliance to medications.  5. Nicotine abuse.   CHIEF COMPLAINT:  Increasing shortness of breath over the past 3 days.   HISTORY OF PRESENT ILLNESS:  [name] is a 55 year old male who has  no prior diagnosis of COPD, but was told by his brother that he probably  needs some inhalers because he has been wheezing and having cough for  several months now.  However, over the past 3 days the patient reports  that his breathing has actually worsened and his sputum changed from  whitish to greenish in color.  He denies any fevers or chills.  He has  no pleuritic chest pain.  The patient was evaluated in the emergency  room and was found to have significantly decreased air entry bilaterally  with wheezing.  The patient has subsequently been admitted.  The patient  does not have any formal diagnosis of COPD.  He continues to smoke about  a pack of cigarettes every 2-3 days.   The patient was also noted to have significantly elevated blood pressure  when he presented to the emergency room.   He denies any weight loss.  He denies any hemoptysis.  He denies any  fevers, chills or leg swelling.   REVIEW OF SYSTEMS:  A 10-point review of systems otherwise negative  except as mentioned in history of present illness.   PAST MEDICAL HISTORY:  1. Hypertension.  2. 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  3. History of avulsion of the  inferior pole of the left patella.   MEDICATIONS:  The patient reports poor compliance to his medication,  but he is supposed to be on:  1. Verapamil e extended release 120 mg p.o. once daily.  2. Prednisone 10 mg p.o. once daily.  3. Lisinopril/hydrochlorothiazide 20/12.5 mg 2 tablets p.o. once      daily.   ALLERGIES:  She denies any known drug allergies.   SOCIAL HISTORY:  The patient is single, has no children and lives by  himself.  He drinks alcohol occasionally.  He smokes about a pack of  cigarettes every 2-3 days.  He is currently employed.   FAMILY HISTORY:  Is positive for hypertension only.   PHYSICAL EXAM:  The patient was conscious, alert, comfortable, was not  in acute distress.  Well oriented in time, place and person.  VITAL SIGNS: Blood pressure was 131/78 with a pulse of 96, respirations  20, temperature 98.2 degrees Fahrenheit.  Oxygen saturation was 95% on  room air.  HEENT EXAM:  Normocephalic, atraumatic.  Oral  mucosa was moist with no  exudates.  NECK: Supple.  No JVD, lymphadenopathy.  LUNGS: Markedly reduced air entry bilaterally with diffuse rhonchi and  wheezing.  HEART: S1,S2 regular.  No murmurs, gallops or rubs.  ABDOMEN: Soft, nontender.  Bowel sounds positive.  No masses palpable.  EXTREMITIES:  Bilateral pitting pedal edema with no calf induration or  tenderness noted.  CNS EXAM:  Was grossly intact with no focal neurological deficits.   LABORATORY/DIAGNOSTIC DATA:  White blood count 6.0, hemoglobin of 14.8,  hematocrit 43.5, platelet count was 236 with 42% neutrophils.  Blood gas  pH was 7.403, pCO2 was 42.2, pO2 was 64.8 with 93% saturation.  This was  on room air at rest.  Sodium 140, potassium 3.6, chloride 106, CO2 is  29, glucose 106, BUN of 13, creatinine was 1.22, calcium is 9.0.  Cardiac enzymes were negative.  Chest x-ray shows evidence of chronic  obstructive pulmonary disease with no acute findings.  12-lead EKG  normal sinus  rhythm.   ASSESSMENT/PLAN:  55 year-old male presenting with increased  shortness of breath especially on activity, increased cough with  purulence.  Physical exam reveals diffuse wheezing and rhonchi.  Symptoms are consistent with likely an acute exacerbation of chronic  obstructive pulmonary disease.  The patient's blood pressure is also  poorly controlled.  Systolic blood pressure was 183/110 when he arrived.   PLAN:  1. We will admit the patient to the medical floor.  We will put him on      telemetry for now.  2. We will initiate IV antibiotics with Levaquin 750 mg once daily.  3. We will continue on Solu-Medrol 125 mg IV every 6 hours for now.  4. I will put the patient on Atrovent and albuterol nebulizers.  5. We will request Pulmonary, Dr. Juanetta Gosling, to evaluate the patient for      long-term followup.  6. I also spent an extended amount of time discussing with the patient      smoking cessation.  7. We will resume the patient's home blood pressure medications.  I      will not make any changes now as his elevated blood pressure may be      due to multiple compliance issues rather than  ineffective      medication or control.  8. Deep vein prophylaxis with Lovenox.  9. Gastrointestinal  prophylaxis with Protonix.      Margaretmary Dys, M.D.  Electronically Signed    AM/MEDQ  D:  11/29/2007  T:  11/29/2007  Job:  [id number]

## 2010-07-24 NOTE — H&P (Signed)
[name], [name]           ACCOUNT NO.:  [id number]   MEDICAL RECORD NO.:  [id number]          PATIENT TYPE:  EMS   LOCATION:  ED                            FACILITY:  [facility]   PHYSICIAN:  Osvaldo Shipper, MD     DATE OF BIRTH:  1955-11-21   DATE OF ADMISSION:  09/05/2008  DATE OF DISCHARGE:  LH                              HISTORY & PHYSICAL   Patient goes to a Dr. Jarold Motto in [place], [place].   ADMISSION DIAGNOSES:  1. Likely viral syndrome.  2. Hypoglycemia, resolved.  3. Fever, unclear etiology, likely viral.  4. History of emphysema.  5. History of hypertension, not on treatment.  6. Abnormal electrocardiogram.  7. Mild Hematochezia   CHIEF COMPLAINT:  Hot, flushed.   HISTORY OF PRESENT ILLNESS:  Patient is a 55 year old African American  male who has a history of emphysema and hypertension, although he is not  taking any medicines because of financial reasons, who was in his usual  state of health until actually last night when he was feeling very hot  and sweaty.  He did not have any other symptoms.  He did not check his  temperature.  He felt well otherwise.  He slept through the night, did  okay.  This morning, he got up and had a sausage biscuit.  He then went  out to work in the garden.  He worked out for about 20 minutes, but it  was not that hot.  He came in back into the house and helped his  girlfriend with his laundry.  Then he suddenly felt hot, flushed.  He  did not feel well.  He was sweating quite a bit, so his fiancee called  EMS.  Apparently, his blood sugar was in the 60's, and blood pressure  was 160/98.  They gave him some orange juice and brought him to the  hospital.  He denies any chest pain, fever, cough, shortness of breath.  He denies any nausea, vomiting, or diarrhea.  He denies any weight loss.  He said he usually remains constipated, and his last bowel movement was  2 days ago.  He mentioned that he strains quite a bit when  he has to go  to the bathroom and has noticed blood when he wipes himself.  He had  mentioned that he has never had a colonoscopy.  He denies any weight  loss.  He says he has been urinating quite a lot and has been drinking a  lot of fluids.  He denies any abdominal pain.  He denies any syncopal  episode.  He denies any lightheadedness or dizziness.  He is complaining  of a psoriatic rash in his hands which seems to be getting worse.   HOME MEDICATIONS:  1. Advair Discus twice daily.  2. Extra Strength Tylenol for joint pain as needed.  3. He was supposed to be on 2 different blood pressure pills but has      not been taking them for 2 months because of financial reasons.  4. He is not on any home O2.   No allergies.  SURGICAL HISTORY:  Surgery for his left knee cap.  Underwent treatment  of left patellar fracture in 2007.   Other medical problems include chronic back pain, emphysema,  hypertension, psoriasis.   SOCIAL HISTORY:  Lives in [place] with his girlfriend.  Currently on  disability.  Smokes cigarettes per day.  Occasional alcohol use.  Last  use was a couple of weeks ago when he had a couple of beers.  No illicit  drug use.   FAMILY HISTORY:  Positive for colon cancer in one of his brothers who  was 55, and he passed away.  He has another brother who has brain  cancer.  There is also a history of stroke and hypertension in the  family.   REVIEW OF SYSTEMS:  GENERAL:  Positive for weakness and malaise.  HEENT:  Unremarkable.  CARDIOVASCULAR:  Unremarkable.  RESPIRATORY:  Unremarkable.  GI:  As in HPI.  GU:  Unremarkable.  NEUROLOGIC:  Unremarkable.  PSYCHIATRIC:  Unremarkable. Other systems unremarkable.  DERMATOLOGICAL:  As in HPI.  Positive for psoriasis.   PHYSICAL EXAMINATION:  VITAL SIGNS:  Temperature recorded here at 5 p.m.  was 100.6.  Blood pressure initially was 151/100.  Last reading was  121/80.  Heart rate initially was 127.  Currently 96.   Respiratory rate  was 26.  Currently 22.  Saturation 97% on room air.  GENERAL:  This is a well-developed, well-nourished, maybe slightly  overweight black male in no distress.  HEENT:  There is no pallor, no icterus.  Oral mucous membranes are  slightly dry.  No oral lesions are noted.  NECK:  Soft and supple.  No thyromegaly is appreciated.  No cervical  lymphadenopathy.  LUNGS:  Clear to auscultation bilaterally.  No wheezes, rales or  rhonchi.  CARDIOVASCULAR:  S1 and S2 is normal.  Regular.  No S3 or S4.  No rubs  or bruits.  No JVD.  No pedal edema.  Peripheral pulses are palpable.  ABDOMEN:  Obese, nontender, nondistended.  Bowel sounds are present.  No  masses are appreciated.  No organomegaly is appreciated.  RECTAL:  Will be done.  MUSCULOSKELETAL:  Unremarkable.  He has a scar on his left knee from  previous surgery.  NEUROLOGIC:  He is alert and oriented x3.  No focal neurological  deficits are present.  SKIN:  Psoriatic rash in both his hands and some in his lower extremity  as well.   LAB DATA:  White count is normal.  His neutrophils are 90%.  Hemoglobin  is 14.7, MCV is 90.  Platelet count is 216.  Glucose is 102.  BUN and  creatinine are normal.  Calcium is 9.2.  Cardiac enzymes are negative.  UA is unremarkable.   Chest x-ray showed emphysematous changes.  Scarring at the bases.  No  acute process is identified.   He had an EKG done which showed a sinus rhythm with a normal axis.  Intervals appeared to be in the normal range.  No definite Q waves are  identified.  He does have some T inversion in the lateral leads and some  evidence of ST depression is also noted, especially in V5, V6.  I think  this is an LV strain pattern.  There is also some evidence for LVH.  No  other changes are noted.  I am trying to obtain old EKGs, which I do not  have at this time.   ASSESSMENT:  This is a 55 year old [place] American male  who has a past  medical history, as stated  earlier, who presents with feeling unwell,  sweaty, hot, found to be hypoglycemic and running a low grade fever.  Etiology for his symptomatology is not very clear.  I think he is having  some viral syndrome, although he denies any sick contacts.  He does have  abnormal EKG, which could be a strain pattern because of high blood  pressure, although his blood pressure did come down eventually in the  hospital.  He admits to having some blood when he wipes himself after a  bowel movement, and he admits to constipation. Along with a family  history of colon cancer, this is concerning and needs further workup.   PLAN:  1. Generalized malaise with hypoglycemia, etiology unclear, likely      viral syndrome.  Will give him IV fluids, monitor him overnight,      recheck his CBGs.  I will give him IV fluids.  At this time, I will      hold off on antibiotics unless he really starts spiking fevers.  2. Untreated hypertension:  He may benefit from a [employer] $4      prescription, but I would like to watch his blood pressure      overnight, since it has come down to 121/80.  3. Mild hematochezia.  A rectal exam will be done after this      dictation.  I will also get stools for occult blood, but this      gentleman eventually needs a colonoscopy.  4. Abnormal electrocardiogram:  I just got another EKG that was done      in 2009, and that shows similar findings to the one from today, so      these changes appear to be old.  We will put him on a baby aspirin.      He may require an outpatient cardiology evaluation.  5. Tobacco abuse:  Counseling was provided.  He will be on a nicotine      patch.   Further management decision will be based on the results of initial  testing and patient's response to treatment.   ADDENDUM: Rectal examination did not reveal any external hemorrhoids.  However he has brown stool which was heme positive.      Osvaldo Shipper, MD  Electronically Signed      GK/MEDQ  D:  09/05/2008  T:  09/05/2008  Job:  [id number]   cc:   Dr. Jarold Motto in [place]

## 2010-07-24 NOTE — Consult Note (Signed)
[name], [name]           ACCOUNT NO.:  [id number]   MEDICAL RECORD NO.:  [id number]          PATIENT TYPE:  INP   LOCATION:  A328                          FACILITY:  [facility]   PHYSICIAN:  Edward L. Juanetta Gosling, M.D.DATE OF BIRTH:  November 11, 1955   DATE OF CONSULTATION:  DATE OF DISCHARGE:                                 CONSULTATION   REASON FOR CONSULTATION:  Chronic obstructive pulmonary disease.   HISTORY:  [name] is a 55 year old who came to the emergency room  complaining of shortness of breath that he said he has had for about 3  days.  He has been somewhat short of breath at home as well saying that  he cannot get up a flight of steps.  He noticed that if he tries to  mount, he gets pretty short of breath.  He has had some cough, but he  has not had any fever or chills.  He has not had any chest pain.  He  says his only medical problem is hypertension.  He obtains primary care  at [facility] with Ninfa Linden, NP.   PAST MEDICAL HISTORY:  Other than his hypertension, he does have back  pain.  He has also had some problem with his knees and had a patellar  fracture.   SOCIAL HISTORY:  He says he smokes about a third of a package of  cigarettes daily, has for many years.  He has not had any industrial  exposures as far as he knows.  His other social history is for that he  is mostly retired.  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  His smoking history is as above.  He does not use alcohol,  does not use any illicit drugs.   FAMILY HISTORY:  Negative for any sort of lung problem at all that he  knows of.  No history of asthma, emphysema, COPD, or lung cancer.   PHYSICAL EXAMINATION:  GENERAL:  He is awake and alert.  He looks fairly  comfortable.  HEENT:  His pupils are reactive.  Nose and throat are clear.  VITAL SIGNS:  Temperature is 98.4, pulse 110, respirations 20, blood  pressure 146/82, and O2 sats 98% on room air.  NECK:.  Supple  without masses.  CHEST:  Rhonchi bilaterally.  Minimal end-expiratory wheezes.  HEART:  Regular without gallop.  ABDOMEN:  Soft.  No masses are felt.  EXTREMITIES:  No edema.  NEUROLOGIC:  CNS is grossly intact.   Chest x-ray shows mild hyperinflation.  BMET shows his potassium is 2.8.  CBC, white count 4700, hemoglobin 13.2, and platelets 206.  Blood gas pH  7.40, pCO2 of 42, and pO2 of 64.   ASSESSMENT:  He has chronic obstructive pulmonary disease exacerbation  and has pretty severe chronic obstructive pulmonary disease in general.   PLAN:  Continue with albuterol.  He is on Atrovent as well and I think I  will switch him to Spiriva.  I am also going to put him on Advair.  He  is on Levaquin, which I think is appropriate.  He is on steroids, which  I think is appropriate.   Thanks for allowing me to see him with you.      Edward L. Juanetta Gosling, M.D.  Electronically Signed     ELH/MEDQ  D:  11/29/2007  T:  11/29/2007  Job:  [id number]   cc:   Olegario Messier L. Jarold Motto, NP  [place], [place]

## 2010-07-24 NOTE — Group Therapy Note (Signed)
[name], [name]           ACCOUNT NO.:  [id number]   MEDICAL RECORD NO.:  [id number]          PATIENT TYPE:  INP   LOCATION:  A328                          FACILITY:  [facility]   PHYSICIAN:  Margaretmary Dys, M.D.DATE OF BIRTH:  Apr 30, 1955   DATE OF PROCEDURE:  11/29/2007  DATE OF DISCHARGE:                                 PROGRESS NOTE   SUBJECTIVE:  The patient feels slightly better today, has less shortness  of breath, less wheezing, continues to cough up some brownish sputum.  He denies any fevers or chills.  No headaches.  He denies any  hemoptysis.   OBJECTIVE:  GENERAL:  Conscious, alert, comfortable, not in acute  distress.  Well oriented to time, place and person.  VITAL SIGNS:  Blood pressure was 134/73 with a pulse of 94, respirations  20, temperature 98.7, oxygen saturation 98% on room air.  HEENT:  Normocephalic, atraumatic.  Oral mucosa was moist with no  exudates.  NECK:  Supple.  No JVD or lymphadenopathy.  LUNGS:  Reduced air entry bilaterally with bilateral diffuse wheezing  and rhonchi.  HEART:  S1, S2 regular.  No S3, S4, gallops or rubs.  ABDOMEN:  Soft, nontender.  Bowel sounds positive.  No masses palpable.  EXTREMITIES:  No edema.  NEUROLOGICAL:  The patient's exam was grossly intact with no focal  neurological deficits.   LABORATORY/DIAGNOSTIC DATA:  White blood cell count 4.7, hemoglobin of  13.2, hematocrit 38.5, platelet count 206 with 92% neutrophils.  Sodium  is 139, potassium 2.8, chloride of 107, CO2 of 23, glucose 140, BUN of  12, creatinine 1.29, calcium 8.7.   ASSESSMENT/PLAN:  1. Acute exacerbation of chronic obstructive pulmonary disease.  The      patient was previously undiagnosed but doing much better, remains      on IV antibiotics, steroid therapy and nebulizers.  2. We appreciate Dr. Juanetta Gosling' input.  He has recommended Advair and      Spiriva.  3. The patient's blood pressure is also much better on resumption of      his  regular home doses.  4. Would discontinue intravenous fluids for now.  5. Continue deep venous thrombosis prophylaxis with Lovenox and      gastrointestinal prophylaxis with Protonix.   DISPOSITION:  The patient will likely go home in the next 24-48 hours.      Margaretmary Dys, M.D.  Electronically Signed    AM/MEDQ  D:  11/29/2007  T:  11/29/2007  Job:  [id number]

## 2010-07-24 NOTE — Discharge Summary (Signed)
[name], [name]           ACCOUNT NO.:  [id number]   MEDICAL RECORD NO.:  [id number]          PATIENT TYPE:  INP   LOCATION:  A328                          FACILITY:  [facility]   PHYSICIAN:  Dorris Singh, DO    DATE OF BIRTH:  August 24, 1955   DATE OF ADMISSION:  11/29/2007  DATE OF DISCHARGE:  09/22/2009LH                               DISCHARGE SUMMARY   ADMISSION DIAGNOSES:  1. Exacerbation of chronic obstructive pulmonary disease.  2. Probably previously undiagnosed chronic obstructive pulmonary      disease.  3. Poorly controlled hypertension.  4. Poor compliance of medication.  5. Nicotine abuse.   DISCHARGE DIAGNOSES:  1. Acute exacerbation of chronic obstructive pulmonary disease,      resolved.  2. Hypertension.  3. Tobacco abuse.   CONSULTATIONS:  Dr. Juanetta Gosling, Pulmonology.   PROCEDURES:  1. Radiology tests done on September 19 include pneumonia Chest which      showed mild COPD, no changes, no acute findings.  2. On September 22, portable chest x-ray showed COPD with minimal      bibasilar atelectasis.   HISTORY OF PRESENT ILLNESS:  His H&P was done by Dr. Sherle Poe.  Please  refer to history of presenting illness.  The patient was admitted with  the above diagnoses with also hypertension, systolic blood pressure  183/110 when he arrived.  The patient was admitted to the medical floor  and put on telemetry.  He was started on IV antibiotics of Levaquin 750  once a day as well as Solu-Medrol.  Dr. Juanetta Gosling was also consulted.  The  patient was placed back on his blood pressure medications which seemed  to be effective and did not have to be changed, so to review his  admitting diagnosis:  1.  Exacerbation of COPD.  The patient was placed  on steroids, nebulizer treatments and Levaquin.  This seemed to improve  his condition greatly.  He did not see pneumonia, so he will not be sent  home on any type of antibiotics at this point in time.  He will not need  breathing treatments as well.  It is recommended that he stop smoking on  several occasions.  The patient is admitted that he will try.  1. Hypertension.  Currently, the patient is on several medications for      his blood pressure.  He seems to be well controlled since he has      been here.  We will recommend that he stays compliant to that as      well.  2. Tobacco abuse.  The patient states that he is interested in      stopping smoking.  I have addressed that as well with him, and have      spoken to him on several occasions.  He will speak to his primary      care physician for any help that he may have for him.   DISCHARGE MEDICATIONS:  1. Prednisone 5 mg p.o. daily.  2. Verapamil 120 mg p.o. daily.  3. Lisinopril HCTZ 20/12.5 mg two p.o. daily.  4. Levaquin 750 mg p.o.  daily times 7 days.  5. Solu-Medrol Dosepak as well to take as directed.   DISCHARGE INSTRUCTIONS:  1. Increase his activity slowly.  2. A low-sodium heart-healthy diet.  3. Follow up with Dr. Eloise Harman in 1-3 days, which we will set up an      appointment.  4. Stop smoking.   PHYSICAL EXAMINATION:  VITAL SIGNS:  The patient was seen and his blood  pressure and everything was stable, and also vitals were stable.  HEART:  Regular rate and rhythm.  LUNGS:  Decreased breath sounds, but clear to auscultation bilaterally.  ABDOMEN:  Soft, nontender, nondistended.  EXTREMITIES:  Positive pulses.   LABORATORY DATA:  White count today was 17.2, platelet count 270.  His  BMP was within normal limits and his condition is stable to be  discharged.   CONDITION:  Stable.   DISPOSITION:  To home.   I spent more than 30 minutes on this discharge.      Dorris Singh, DO  Electronically Signed     CB/MEDQ  D:  12/01/2007  T:  12/01/2007  Job:  [id number]

## 2010-07-24 NOTE — Discharge Summary (Signed)
[name], [name]           ACCOUNT NO.:  [id number]   MEDICAL RECORD NO.:  [id number]          PATIENT TYPE:  OBV   LOCATION:  A326                          FACILITY:  [facility]   PHYSICIAN:  Melissa L. Ladona Ridgel, MD  DATE OF BIRTH:  02-24-1956   DATE OF ADMISSION:  09/05/2008  DATE OF DISCHARGE:  06/29/2010LH                               DISCHARGE SUMMARY   DISCHARGE DIAGNOSES:  1. Fever without a bacterial or infectious source that is      identifiable, a viral source is suspected.  2. Hypertension.  The patient has been off of his blood pressure      medications now for 2 months secondary to inability to purchase      them.  At this time, the patient does not even know what      medications he is suppose to be on.  In hospital, his blood      pressures have been 135/87, at the highest it was 152.  His pulse      has been in the 80s and he is completely asymptomatic without any      flushing.  3. Hypoglycemia that is resolved.  4. Emphysema and chronic obstructive pulmonary disease, currently      compensated.  5. Abnormal EKG.  The patient had previously changes in EKG with no      acute findings during this admission.  Currently, his cardiac      markers have been completely negative.  He has no evidence for any      chest pain.  The feeling was that this patient had been out in the      garden working in the heat and then perhaps there may been a      prodrome of a viral like illness, however, at this time I have no      support for bacterial infection.   MEDICATIONS AT TIME OF DISCHARGE:  1. Lexidus cream 0.05% applied to the skin rash three times daily.  2. Advair 250/50 inhaled twice daily.  3. Spiriva 1 capsule inhaled daily.  4. He does not know what either of the blood pressure medications that      he is supposed to be on.  He does, however, state that he has      access to Dr. Jarold Motto in the morning and I would support a visit      to his physician in the morning  to try to obtain the appropriate      blood pressure medication that he was on.  Back in 2009, he had      been on verapamil and an ACE inhibitor.  So with his ability to      access his physician, I am going to defer to his primary care      physician for his blood pressure medications and recommend that he      have prescriptions that can be ordered from [facility] on the 4      dollar program.   HOSPITAL COURSE:  The patient is a 55 year old gentleman who was  admitted last evening with  sort of a vague elevation in his temperature,  sweating, hypoglycemia - all after being outside in the heat for an  extended period time working in his garden.  The patient does have an  abnormal EKG, but is no different than a previous EKG noted in his  chart.   Overnight, the patient was hydrated.  He was evaluated using serial  cardiac markers which were all within normal limits.  He has no further  complaints of sweating or chest pain.  He never did have chest pain.  His urinalysis is negative.  His chest x-ray shows hyperinflation, but  no evidence for pneumonia.   At this time, the patient is deemed stable for discharge to home to  follow up with his primary care physician to obtain his blood pressure  medications which is at his recommendation.  He said he can access Dr.  Jarold Motto tomorrow because he does not know what medications he is  supposed to be taking.  At this time, I feel that this is a safe plan as  his blood pressures are reasonably controlled.   With regard to his hemoglobin A1c it is actually 5.6, which is pretty  well controlled.  His TSH was 0.555, which is within normal limits and  his lipid panel shows a slightly elevated LDH at 126 with a low HDL at  39, but a total cholesterol of 179.  Currently, blood cultures have no  growth x1 day.  I will request that Dr. Jarold Motto please follow up the  completion on his blood cultures, but again the patient has been  afebrile for me  here, so I suspect there is an element of exposure to  the environment with heat exhaustion as being a primary reason for some  of his symptoms.  So at this time the patient is comfortable with  following up with his primary care physician.   PHYSICAL EXAMINATION:  HEENT:  Pupils are equal, round and reactive to  light.  He has a little bit of a strabismus in the left eye.  He has  anicteric sclerae.  Examination of the nose and septum midline without  discharge.  Examination of the mouth reveals no oral lesions or lip  lesions.  NECK:  Supple.  There is no JVD, no lymph nodes, no carotid bruits.  CHEST:  Decreased, but clear to auscultation.  There is no rhonchi,  rales or wheezes.  CARDIOVASCULAR:  Regular rate rhythm.  Positive S1-S2.  No S3-S4.  No  murmurs, rubs or gallops.  ABDOMEN:  Obese, nontender, nondistended with positive bowel sounds.  No  hepatosplenomegaly, no guarding, no rebound.  EXTREMITIES:  He has no peripheral edema.  He has 2+ pulses.  NEUROLOGIC:  Cranial nerves II-XII are intact with the exception of the  left strabismus.   As stated, clinically he is stable for discharge to home to follow up in  the morning with his primary care physician.  I have provided him a  prescription for the Lexidus, but he needs to speak with Dr. Jarold Motto  regarding his blood pressure medications.  This is a less than 30 minute  discharge.      Melissa L. Ladona Ridgel, MD  Electronically Signed     MLT/MEDQ  D:  09/06/2008  T:  09/06/2008  Job:  [id number]

## 2010-07-24 NOTE — Group Therapy Note (Signed)
NAMEAMUN, STEMM           ACCOUNT NO.:  [id number]   MEDICAL RECORD NO.:  [id number]          PATIENT TYPE:  INP   LOCATION:  A328                          FACILITY:  [facility]   PHYSICIAN:  Edward L. Juanetta Gosling, M.D.DATE OF BIRTH:  [birth year]   DATE OF PROCEDURE:  DATE OF DISCHARGE:  12/01/2007                                 PROGRESS NOTE   PATIENT OF:  [facility]ist Team.   Mr. Dusek states that he is doing okay and has no new complaints.  He feels better.  He is not short of breath.   PHYSICAL EXAMINATION:  GENERAL:  Shows that he is awake and alert.  He  looks comfortable.  VITAL SIGNS:  His temperature is 98.6, pulse 104, respirations 20, blood  pressure 126/73, and O2 sat is 94% on room air.   ASSESSMENT:  He does have chronic obstructive pulmonary disease, but he  does seem a great deal better.   PLAN:  Continue with his current treatment.  I expect he will be able to  go home soon.  Then, I will be glad to follow him at his pulmonary  physician as an outpatient.  Thanks for allowing me to see him with you.      Edward L. Juanetta Gosling, M.D.  Electronically Signed     ELH/MEDQ  D:  12/01/2007  T:  12/02/2007  Job:  [id number]

## 2010-07-25 LAB — BASIC METABOLIC PANEL
CO2: 28 mEq/L (ref 19–32)
Glucose, Bld: 137 mg/dL — ABNORMAL HIGH (ref 70–99)
Potassium: 3.5 mEq/L (ref 3.5–5.1)
Sodium: 137 mEq/L (ref 135–145)

## 2010-07-25 LAB — CARDIAC PANEL(CRET KIN+CKTOT+MB+TROPI)
CK, MB: 4.4 ng/mL — ABNORMAL HIGH (ref 0.3–4.0)
Relative Index: 3.8 — ABNORMAL HIGH (ref 0.0–2.5)

## 2010-07-25 LAB — CBC
HCT: 40.4 % (ref 39.0–52.0)
Hemoglobin: 13.6 g/dL (ref 13.0–17.0)
RBC: 4.51 MIL/uL (ref 4.22–5.81)
WBC: 8.5 10*3/uL (ref 4.0–10.5)

## 2010-07-25 LAB — DIFFERENTIAL
Basophils Absolute: 0 10*3/uL (ref 0.0–0.1)
Lymphocytes Relative: 10 % — ABNORMAL LOW (ref 12–46)
Monocytes Absolute: 0.1 10*3/uL (ref 0.1–1.0)
Neutro Abs: 7.6 10*3/uL (ref 1.7–7.7)

## 2010-07-25 NOTE — Discharge Summary (Signed)
[name], [name]           ACCOUNT NO.:  [id number]  MEDICAL RECORD NO.:  [id number]           PATIENT TYPE:  I  LOCATION:  IC11                          FACILITY:  [facility]  PHYSICIAN:  Wilson Singer, M.D.DATE OF BIRTH:  Sep 25, 1955  DATE OF ADMISSION:  07/24/2010 DATE OF DISCHARGE:  05/16/2012LH                              DISCHARGE SUMMARY   FINAL DISCHARGE DIAGNOSIS:  Exacerbation of chronic obstructive pulmonary disease due to lack of Advair inhaler.  CONDITION ON DISCHARGE:  Stable.  MEDICATIONS ON DISCHARGE: 1. Avelox 400 mg daily for 5 days. 2. Nicotine patch 14 mg daily. 3. Prednisone reducing taper over 9 days. 4. Advair Diskus 250/50, 1 puff twice a day. 5. Albuterol nebulizer as required at home. 6. Aspirin 81 mg daily. 7. Amlodipine 10 mg daily. 8. Carvedilol 12.5 mg b.i.d. 9. Etodolac 500 mg b.i.d. p.r.n. 10.Furosemide 20 mg daily. 11.Lisinopril/HCTZ 20/12.5, 1 tablet daily. 12.Orphenadrine 100 mg 1 tablet daily. 13.Ventolin inhaler 1-2 puffs every 4 hours p.r.n.  HISTORY:  This very pleasant 55 year old man was admitted yesterday with dyspnea and exacerbation of COPD to the extent that he required BiPAP in the emergency room.  Please see initial history and physical examination done by Dr. Salome Arnt.  HOSPITAL PROGRESS:  The patient rapidly improved and on taking his history he tells me that he had run out of his Advair inhaler and not got it refilled.  He has a tendency to decompensate very rapidly when he does not take his Advair inhaler.  He now feels back to his normal self and normal pattern of breathing.  He is not oxygen dependent at home.  PHYSICAL EXAMINATION:  VITAL SIGNS:  Temperature 97.8, blood pressure 138/91, pulse 97, saturation 95% on 2 liters oxygen. GENERAL:  He looks systemically well.  There is no peripheral or central cyanosis.  There is no increased work of breathing.  There is no clubbing. CARDIOVASCULAR:  Heart sounds  are present normal without murmurs. LUNGS:  Lung fields are clear without any wheezing whatsoever.  INVESTIGATION:  Sodium 137, potassium 3.5, bicarbonate 28, BUN 20, creatinine 1.45, hemoglobin 13.6, white blood cell count 8.5, platelets 236,000.  Cardiac panel have been largely negative and unremarkable for evidence of ischemia.  In particular his CPK is not particularly elevated, although his CK-MB is elevated.  His troponin levels have been normal less than 0.3.  I not believe he has any cardiac ischemia or infarction.  DISPOSITION:  The patient is stable to be discharged home now and I have given him prescriptions of his Advair inhaler as well as a reducing course of prednisone.  Also in view of trying to quit smoking, I have given him a prescription of nicotine patch which he thinks will be useful.  I have told him also to go and see a pulmonologist as I think his condition warrants this and we will try and make an appointment for him to see Dr. Juanetta Gosling our local pulmonologist.     Wilson Singer, M.D.     NCG/MEDQ  D:  07/25/2010  T:  07/25/2010  Job:  [id number]  cc:   Ninfa Linden,  FNP Fax: [phone]  Oneal Deputy. Juanetta Gosling, M.D. Fax: [phone]  Gerrit Friends. Dietrich Pates, MD, [facility] [address] [place], [place] [address]  Electronically Signed by Lilly Cove M.D. on 07/25/2010 01:45:23 PM
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
## 2010-07-27 NOTE — Group Therapy Note (Signed)
NAMEERNAN, RUNKLES           ACCOUNT NO.:  [id number]   MEDICAL RECORD NO.:  [id number]          PATIENT TYPE:  INP   LOCATION:  A332                          FACILITY:  [facility]   PHYSICIAN:  Margaretmary Dys, M.D.DATE OF BIRTH:  [birth year]   DATE OF PROCEDURE:  DATE OF DISCHARGE:                                   PROGRESS NOTE   OBJECTIVE:  Referring patient for blood pressure management, patient is  scheduled for surgery today.  VITAL SIGNS: Blood pressure slightly improved to 123/91 with pulse.   We will continue on blood pressure control with hydrochlorothiazide and  Lopressor.   Patient has some room to go with increasing metoprolol. She is  very stable  and we will continue to follow her.      Margaretmary Dys, M.D.  Electronically Signed     AM/MEDQ  D:  12/14/2005  T:  12/15/2005  Job:  [id number]

## 2010-07-27 NOTE — Op Note (Signed)
[name], [name]           ACCOUNT NO.:  [id number]   MEDICAL RECORD NO.:  [id number]          PATIENT TYPE:  INP   LOCATION:  A332                          FACILITY:  [facility]   PHYSICIAN:  Vickki Hearing, M.D.DATE OF BIRTH:  1955/07/10   DATE OF PROCEDURE:  12/14/2005  DATE OF DISCHARGE:  12/15/2005                                 OPERATIVE REPORT   HISTORY:  This is a 55 year old male who fell and injured his left knee,  sustaining a patella fracture which was essentially an inferior pole  avulsion with disruption of the extensor mechanism.  He presented with pain,  swelling, and extensor lag of the left knee.  He unfortunately had  uncontrolled hypertension and was admitted to the hospital to control that  in preparation for surgery.   PREOPERATIVE DIAGNOSIS:  Patella fracture, closed, left knee.   POSTOPERATIVE DIAGNOSIS:  Patella fracture, closed, left knee.   PROCEDURE:  Open treatment of left patella fracture, CPT code 16109, ICD-9  Code 822.0.   SURGEON:  Vickki Hearing, M.D.   ANESTHETIC:  Spinal.   OPERATIVE FINDINGS:  There was an inferior pole fracture of the left  patella.  This was a closed injury.  There was medial and lateral  retinacular disruption.  The intra-articular portion of the knee was normal  including the cartilaginous surfaces.  ACL was visually inspected and was  intact.   The primary indication for surgery was disruption of the extensor mechanism  secondary to closed left patella fracture.   DESCRIPTION OF PROCEDURE:  The patient was identified as [name].  His left leg was marked for surgery, countersigned by the  surgeon.  He was taken to the operating room for spinal anesthetic.  Antibiotics were started on the way back to surgery and were completed prior  to skin incision.  After sterile prep and drape, a timeout procedure was  completed and every one agreed that [name] was having surgery  on his  left leg for patella fracture.   After exsanguination of the limb with a 6-inch Esmarch and elevation of the  tourniquet to 300 mmHg (total tourniquet time 56 minutes), the incision was  made over the patella and carried just proximal to the superior pole and to  the level of the tibial tubercle.  The subcutaneous tissue was divided until  the extensor mechanism was identified.  Medial and lateral subcutaneous  dissection was completed.  The retinacular tear and fracture were  identified.  The wound including the joint was inspected and irrigated.  A  #5 Tycron suture was passed from the tibial tubercle through the patellar  tendon through the inferior evulsion and then through the patella and tied  superiorly, closing the fracture with good apposition of bone.  Soft tissue  and of bone fragments were removed prior to closure of the defect.  A  radiograph was obtained to ensure good reduction.  We then irrigated the  wound again, closed the retinaculum with #1 running Bralon suture, and  flexed the knee to 45 degrees with no tension noted on the fracture site.  At 60 degrees, there was tension noted at the fracture site.   We closed the subcutaneous with 0-Monocryl and 2-0 Monocryl.  We injected 30  cubic centimeters of Marcaine in the joint, closed the skin edges with  staples, applied a sterile dressing and a Cryo/Cuff, released the  tourniquet, and put the patient in the knee immobilizer.   POSTOPERATIVE PLAN:  Bedrest today, physical therapy tomorrow, full  weightbearing and extension with extension brace.  In ten days to two weeks,  we will take the staples out.  We will put him in a hinge brace and start  range of motion exercises, 0-45 for the balance of six weeks.  After that  time, his range of motion can be advanced as tolerated.   CPT code again is 27524, open treatment of closed patella fracture.  ICD-9  Code 822.0 for patella fracture.      Vickki Hearing, M.D.   Electronically Signed     SEH/MEDQ  D:  12/14/2005  T:  12/16/2005  Job:  [id number]

## 2010-07-27 NOTE — Group Therapy Note (Signed)
[name], [name]           ACCOUNT NO.:  [id number]   MEDICAL RECORD NO.:  [id number]          PATIENT TYPE:  INP   LOCATION:  A332                          FACILITY:  [facility]   PHYSICIAN:  Margaretmary Dys, M.D.DATE OF BIRTH:  25-Apr-1955   DATE OF PROCEDURE:  12/15/2005  DATE OF DISCHARGE:                                   PROGRESS NOTE   SUBJECTIVE:  The patient is doing better.  He states his left knee pain  postoperatively is not that bad in terms of the pain.  He has no chest pain  or shortness of breath.  Hospitalist service has followed him for his blood  pressure.   OBJECTIVE:  Conscious, alert, comfortable, not in acute distress.  Pleasant.  VITAL SIGNS:  Blood pressure ranged between 151-145/95-101.  Pulse of 70,  respiration of 20, temperature of 98.5.  HEENT:  Normocephalic, atraumatic.  Oral mucosa was moist with no exudates.  NECK:  Supple.  No JVD.  LUNGS:  Clear equally with good air entry bilaterally.  HEART:  S1, S2 regular.  ABDOMEN:  Soft, nontender.  Bowel sounds positive.  EXTREMITIES:  With surgical changes on the left with a brace.   LAB WORK/DIAGNOSTIC DATA:  Hemoglobin and hematocrit stable.  Potassium is  3.4.   ASSESSMENT AND PLAN:  Poorly-controlled hypertension.  The patient was only  recently started on hydrochlorothiazide, Lopressor in the hospital.  His  blood pressure is still not perfectly controlled yet, but I think still give  it some time for the medications to kick in prior to adjusting them.  I  recommend that the patient continue on those 2 medications at home and  follow up with his primary doctor in the next 2-3 weeks for dose  adjustments.  He will probably go home on hydrochlorothiazide 25 mg once a  day and Lopressor 100 mg p.o. b.i.d.  I have discussed this with Dr.  Romeo Apple, the orthopedic surgeon.  He otherwise remains stable.      Margaretmary Dys, M.D.  Electronically Signed     AM/MEDQ  D:  12/15/2005  T:   12/15/2005  Job:  [id number]

## 2010-07-27 NOTE — H&P (Signed)
[name], [name]           ACCOUNT NO.:  [id number]   MEDICAL RECORD NO.:  [id number]          PATIENT TYPE:  INP   LOCATION:  A332                          FACILITY:  [facility]   PHYSICIAN:  Vickki Hearing, M.D.DATE OF BIRTH:  [birth year]   DATE OF ADMISSION:  12/12/2005  DATE OF DISCHARGE:  LH                                HISTORY & PHYSICAL   CHIEF COMPLAINT:  Left knee pain.   SECONDARY COMPLAINT:  Chronic uncontrolled hypertension.   HISTORY OF PRESENT ILLNESS:  This is a 55 year old male currently  unemployed, who has a history of chronic hypertension, untreated, and  presents with pain, swelling and inability to ambulate or extend or straight-  leg-raise his left lower extremity.  On October 3, this patient fell and  injured his left knee and felt intense pain anteriorly and presented to the  ER with the symptoms as stated.  He complains of 9/10 pain, relieved  modestly with Vicodin.  Pain is constant, improve by moving his foot and  moving his toes.   REVIEW OF SYSTEMS:  This patient has stated that he has no findings under  review of systems x10.   ALLERGIES:  He has no allergies.   PAST MEDICAL HISTORY:  He says he has blood pressure problems, but takes no  medicines.  He is seen by Dr. Shelle Iron for disk problems.   PAST SURGICAL HISTORY:  No previous surgery.   MEDICATIONS:  He currently takes no medications.   FAMILY HISTORY:  Listed his family history as negative.   PRIMARY CARE PHYSICIAN:  He does not have a family physician.   SOCIAL HISTORY:  He is single.  He states that he was laid off.  He smokes 1  pack every 2 days, does not drink caffeine or alcohol.  Highest grade  completed was 12.  His weight is 195, pulse 88, respiratory rate is 18.  His  blood pressure in the emergency room yesterday was 210/150; today it is  162/105.  GENERAL APPEARANCE:  He was well-developed and nourished.  Grooming and  hygiene were normal and there were no gross  deformities.  HEENT:  We see upper and lower dentures.  NECK:  Negative lymph nodes.  Chest:  Clear.  HEART:  Rate and rhythm normal.  Distal pulses intact.  Temperature normal.  No edema, tenderness or varicose veins.  ABDOMEN:  Soft and non-distended.  EXTREMITIES:  Gait:  He ambulates crutches in a knee immobilizer  Examination of the left knee shows he has a defect in the distal pole of the  patella.  He has a negative straight leg raise, which indicates no extension  and extensor mechanism disruption.  We did not flex the knee because of  pain.  The knee was swollen with a joint effusion.  Stability testing was  deferred.  Muscle strength was 0/5 in the extensor mechanism.  His other 3  extremities well-aligned without contracture, subluxation, atrophy or  tremor.  Skin was bruise over the medial side of the left knee, otherwise  normal.  Reflexes on the right were normal.  NEUROLOGIC:  Sensation  intact.  He was oriented x3.  Mood and affect normal.   IMPRESSION:  Patellar fracture, as indicated on x-ray, distal pole; this is  an extensor mechanism disruption which requires surgery.   I strongly recommended that the patient get his health situation in order.  I counseled him about stroke, heart attack and kidney failure from  uncontrolled hypertension.   He will need to be admitted to the hospital.  I have called Dr. Rito Ehrlich for  consult; he recommended hydrochlorothiazide and metoprolol.  I added  potassium.  He will get a full workup in preparation for surgery.  He needs  to have his kneerepaired as soon as we can, once we get his blood pressure  stabilized.   ADMITTING DIAGNOSIS:  Uncontrolled hypertension.   SECONDARY DIAGNOSIS:  Patellar fracture left knee.      Vickki Hearing, M.D.  Electronically Signed     SEH/MEDQ  D:  12/12/2005  T:  12/13/2005  Job:  [id number]

## 2010-07-27 NOTE — Discharge Summary (Signed)
NAMEREECE, FEHNEL           ACCOUNT NO.:  [id number]   MEDICAL RECORD NO.:  [id number]          PATIENT TYPE:  INP   LOCATION:  A332                          FACILITY:  [facility]   PHYSICIAN:  Vickki Hearing, M.D.DATE OF BIRTH:  Feb 09, 1956   DATE OF ADMISSION:  12/12/2005  DATE OF DISCHARGE:  10/07/2007LH                                 DISCHARGE SUMMARY   ADMITTING DIAGNOSES:  1. Closed patella avulsion fracture, left knee.  2. Chronic uncontrolled hypertension.   DISCHARGE DIAGNOSES:  1. Closed patella avulsion fracture, left knee.  2. Chronic uncontrolled hypertension.   OPERATIVE PROCEDURE:  Open treatment closed patella fracture, left knee, on  December 14, 2005, tolerated well without complication.   CONSULTATIONS:  The hospitalist was consulted for the patient's uncontrolled  hypertension prior to surgery.  He was started on hydrochlorothiazide and  metoprolol, and his blood pressure was controlled, and surgery was done on  October 6.   HOSPITAL COURSE:  This patient was admitted on October 4 as stated.  Hospitalist consultation was obtained, and blood pressure was managed with  hydrochlorothiazide and metoprolol.  After this, he had surgery on October  6, and it was well tolerated.  No complications were noted.  On October 7,  he was independent with crutch ambulation, nonweight bearing and was  discharged to home.   HISTORY/CHIEF COMPLAINT:  Left knee pain.  This was a 55 year old male  unemployed with a history of chronic hypertension, untreated, and notable  noncompliance by the patient who presented with pain, swelling and inability  to ambulate or extend his left knee.  His injury was on [place]  secondary to a fall.  He presented to the office and was admitted with  uncontrolled hypertension to be controlled prior to surgery.  Of note, the  patient had a negative straight leg raise which indicated extensor mechanism  disruption.   The only other thing he  complained of was chronic back pain with no previous  surgical treatment.   In the hospital, the patient had several laboratory studies done in  preparation for surgery.  His hemoglobin on December 12, 2005 was 15.3.  His  sodium was 138, potassium 3.5, creatinine 1.4, glucose 133 and BUN was 10.  His cholesterol was 178, and on repeat the lipid profile was 167.  He had  triglycerides which were high at 160, HDL was low at 37.  His total  cholesterol HDL ratio was 4.5, his VLDLs were 32 - normal LDLs 98 - normal.  His TSH level was 1.267.   The patient was discharge on:  1. Vicodin 5 mg 1 q.4h. p.r.n.  2. Hydrochlorothiazide 25 mg once daily.  3. K-Dur 20 mEq once daily.  4. Lopressor 50 mg p.o. q.6h.   FOLLOW-UP APPOINTMENTS:  Follow-up appointment was scheduled with Dr. Jen Mow  or Fonnie Jarvis; the patient has no insurance.   He was told to use a CryoCuff 1 hour 3 times a day for 3 days to keep  swelling down.  He is on a low-salt diet.  No driving or lifting for 6  weeks.  Follow-up visit scheduled  for Monday, October15, in my office.   Disposition was to his home, and overall condition was improved.      Vickki Hearing, M.D.  Electronically Signed     SEH/MEDQ  D:  01/12/2006  T:  01/13/2006  Job:  [id number]

## 2010-07-28 NOTE — H&P (Signed)
[name], [name]           ACCOUNT NO.:  [id number]  MEDICAL RECORD NO.:  [id number]           PATIENT TYPE:  I  LOCATION:  IC11                          FACILITY:  [facility]  PHYSICIAN:  Hillery Aldo, M.D.   DATE OF BIRTH:  02-13-1956  DATE OF ADMISSION:  07/24/2010 DATE OF DISCHARGE:  LH                             HISTORY & PHYSICAL   PRIMARY CARE PROVIDER:  Ninfa Linden, FNP in [place].  CARDIOLOGIST:  Gerrit Friends. Dietrich Pates, MD, [facility]  CHIEF COMPLAINT:  Dyspnea.  HISTORY OF PRESENT ILLNESS:  The patient is a 55 year old male with a past medical history of COPD, who presents to the hospital with progressive dyspnea for the past 24 hours.  The patient denies any associated fever or chills.  He denies any subjective wheezing.  He does note that he has had a cough productive of thick green sputum which is a change over his baseline secretions.  He denies any chest pain.  The patient was in respiratory distress upon arrival to the emergency department and he has been placed on BiPAP and reports improvement in his dyspnea since being on BiPAP.  He denies any prior history of respiratory failure or intubation.  The patient states that he has been taking Advair and ran out of his bronchodilator and used an alternative puffer this morning.  Nevertheless, the patient states that Advair and bronchodilators have not improved his symptoms.  PAST MEDICAL HISTORY: 1. Chronic obstructive pulmonary disease in the setting of ongoing     tobacco abuse. 2. Status post clean cardiac catheterization February 2012 with an     ejection fraction of 55-60%. 3. Hypertension. 4. Stage II chronic kidney disease. 5. Erectile dysfunction. 6. Chronic lower back pain. 7. 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 8. Psoriasis. 9. Questionable history of carotid arterial aneurysm. 10.History of medical nonadherence with current nonadherence to     antihypertensive therapy.   According to pharmacy staff, he has not     had his Coreg, Norvasc, or Lasix filled since February.  PAST SURGICAL HISTORY: 1. Open reduction internal fixation of the left patella fracture. 2. Ophthalmic surgery secondary to left eye trauma.  ALLERGIES:  No known drug allergies.  He is allergic to BEE STINGS.  CURRENT MEDICATIONS: 1. Coreg 12.5 mg p.o. b.i.d. 2. Norvasc 10 mg p.o. daily. 3. Lasix 20 mg p.o. daily. 4. Advair Diskus 250/50 one inhalation b.i.d. 5. Lisinopril/hydrochlorothiazide 20/12.5 mg p.o. daily. 6. Ventolin inhaler 2 puffs q.4 h. p.r.n.  FAMILY HISTORY:  The patient's mother died after cardiac surgery.  The patient's father is alive at 35 and is reportedly healthy.  He has one brother who died at the age of 33 from colon cancer and another brother who died from brain cancer.  Four of his sisters died in MVA.  SOCIAL HISTORY:  The patient is divorced.  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  Currently, lives with a girlfriend.  He continues to smoke approximately one half to one third pack of tobacco daily and has done so  for over 40 years.  He occasionally drinks alcohol.  Denies drug use.  REVIEW OF SYSTEMS:  CONSTITUTIONAL:  No fever or chills.  No weight changes.  Appetite has been normal.  HEENT:  Blindness in the left eye. CARDIOVASCULAR:  No chest pain or dysrhythmia.  RESPIRATORY:  Positive for dyspnea and green sputum with cough.  GI:  No nausea, vomiting, diarrhea, melena or hematochezia.  GU:  No dysuria or hematuria. MUSCULOSKELETAL:  Chronic lower back pain.  A comprehensive 14-point review of systems is otherwise unremarkable.  PHYSICAL EXAMINATION:  VITAL SIGNS:  Temperature 97.6, blood pressure 204/118, 172/105 on recheck, pulse 88, respirations 24, O2 saturation 98% on BiPAP. GENERAL:  Well-developed, well-nourished male in mild respiratory distress. HEENT:   Normocephalic, atraumatic.  Right pupil is round and reactive to light.  Left pupil scar by trauma.  Oropharynx is clear.  NECK:  Supple, no thyromegaly, no lymphadenopathy, no jugular venous distention. CHEST:  Diminished breath sounds bilaterally with expiratory wheezes. GI:  Abdomen soft, nontender, nondistended with normoactive bowel sounds. EXTREMITIES:  No clubbing, edema, or cyanosis. SKIN:  Warm and dry.  Psoriatic lesions and plaques to his trunk noted. NEUROLOGIC:  The patient is alert and oriented x3.  Cranial nerves II through XII grossly intact.  Nonfocal.  DATA REVIEW:  An 12-lead EKG shows normal sinus rhythm with suspected inferior ischemia as evidenced by T-wave inversions in the lateral and inferior leads.  Chest x-ray shows COPD with minimal chronic atelectasis or scarring at the right base with no acute abnormalities.  LABORATORY DATA:  Arterial blood gas shows a pH of 7.396, pCO2 of 46.7, pO2 of 168 on BiPAP.  Sodium is 140, potassium 3.9, chloride 101, bicarb 30, BUN 9, creatinine 1.13, glucose 110, calcium 9.5.  White blood cell count is 5.4, hemoglobin 14.8, hematocrit 44.4, platelets 239.  ASSESSMENT AND PLAN: 1. Acute respiratory failure secondary to chronic obstructive     pulmonary disease exacerbation:  We will admit the patient to the     Step-Down Unit and place him on BiPAP.  We will wean the BiPAP as     tolerated.  The patient will be given Solu-Medrol intravenously,     nebulized bronchodilator therapy, and empiric antibiotics with     Avelox.  We will attempt to obtain a sputum culture. 2. Accelerated hypertension:  It appears to be precipitated by medical     nonadherence.  We will resume his antihypertensive medications and     utilize hydralazine IV p.r.n. until his blood pressure is better     controlled. 3. EKG abnormalities:  The patient's EKG pattern likely represents     ischemia from accelerated hypertension in the setting of      respiratory failure.  We will check an EKG again in the morning and     cycle cardiac markers q.6 h. x3 sets. 4. Stage II chronic kidney disease:  We will monitor the patient's     renal function closely. 5. Chronic lower back pain:  We will provide the patient with pain     medications if needed. 6. Prophylaxis:  We will initiate Lovenox for DVT prophylaxis.  Time spent on admission including face-to-face time equals 1 hour.     Hillery Aldo, M.D.     CR/MEDQ  D:  07/24/2010  T:  07/24/2010  Job:  [id number]  cc:   Ninfa Linden, FNP Fax: [phone]  Gerrit Friends. Dietrich Pates, MD, [facility] [address] [place], [place] [address]  Electronically  Signed by Hillery Aldo M.D. on 07/28/2010 07:13:32 AM

## 2010-08-07 ENCOUNTER — Ambulatory Visit: Payer: Medicare Other | Attending: Nurse Practitioner

## 2010-08-07 DIAGNOSIS — G4733 Obstructive sleep apnea (adult) (pediatric): Secondary | ICD-10-CM | POA: Insufficient documentation

## 2010-08-12 NOTE — Procedures (Signed)
[name], [name]           ACCOUNT NO.:  [id number]  MEDICAL RECORD NO.:  [id number]          PATIENT TYPE:  OUT  LOCATION:  SLEEP LAB                     FACILITY:  [facility]  PHYSICIAN:  Aleiah Mohammed A. Gerilyn [name], M.D. DATE OF BIRTH:  [birth year]  DATE OF STUDY:                           NOCTURNAL POLYSOMNOGRAM  REFERRING PHYSICIAN:  DENISE FISHER  REFERRING PHYSICIAN:  Ninfa Linden, FNP.  INDICATIONS:  A 54-hour-old who presents with witnessed apnea, snoring and fatigue  and this study has been done to evaluate for obstructive sleep apnea syndrome.  INDICATION FOR STUDY:  EPWORTH SLEEPINESS SCORE:  Epworth sleepiness scale two.  BMI 33.  ARCHITECTURAL SUMMARY:  The total recording time of this reading was 379.5 minutes.  Sleep efficiency 80% and sleep efficacy of  5.5 minutes. REM latency 91.0 minutes.  Stage N one 10%, N two 73.1% and N three 4.8% and REM sleep 12.2%.  RESPIRATORY SUMMARY:  Baseline oxygen saturation 96 and lowest saturation 83 in REM sleep.  DIAGNOSTIC'S:  AHI 10 and RDI 12; the events occurred almost exclusively during REM sleep with a REM index of 34.  LIMB MOVEMENT SUMMARY:  PLM index 0.  ELECTROCARDIOGRAPHIC SUMMARY:  Average heart rate is 99, with no significant dysrhythmias observed.  IMPRESSION:  Mild sleep disorder breathing, worse during the rapid eye movement sleep with marked desaturation.  RECOMMENDATIONS:  Given the desaturations, I suggest formal CPAP titration study.  Thanks for this referral.  MEDICATIONS:  Advair, nicotine patch, amlodipine, Coreg, lisinopril, Ventolin.  SLEEP ARCHITECTURE:  RESPIRATORY DATA:  OXYGEN DATA:  CARDIAC DATA:  MOVEMENT-PARASOMNIA:  IMPRESSIONS-RECOMMENDATIONS:     [name], M.D. Electronically Signed 08/13/2010 09:52:48    KAD/MEDQ  D:  08/12/2010 17:14:28  T:  08/12/2010 17:54:48  Job:  [id number]

## 2010-11-09 ENCOUNTER — Ambulatory Visit: Payer: Medicare Other | Attending: Pulmonary Disease | Admitting: Sleep Medicine

## 2010-11-09 DIAGNOSIS — G4733 Obstructive sleep apnea (adult) (pediatric): Secondary | ICD-10-CM | POA: Insufficient documentation

## 2010-11-09 DIAGNOSIS — G4730 Sleep apnea, unspecified: Secondary | ICD-10-CM

## 2010-11-09 DIAGNOSIS — Z6833 Body mass index (BMI) 33.0-33.9, adult: Secondary | ICD-10-CM | POA: Insufficient documentation

## 2010-11-10 NOTE — Progress Notes (Deleted)
Subjective:     Patient ID: [name], male   DOB: [birth year], 55 y.o.   MRN: [id number]  HPI   Review of Systems     Objective:   Physical Exam     Assessment:     ***    Plan:     ***

## 2010-11-12 NOTE — Procedures (Signed)
[name], [name]           ACCOUNT NO.:  [id number]  MEDICAL RECORD NO.:  [id number]          PATIENT TYPE:  OUT  LOCATION:  SLEEP LAB                     FACILITY:  [facility]  PHYSICIAN:  Merisa Julio A. Gerilyn Pilgrim, M.D. DATE OF BIRTH:  April 03, 1955  DATE OF STUDY:  11/09/2010                           NOCTURNAL POLYSOMNOGRAM  REFERRING PHYSICIAN:  Ramon Dredge L. Juanetta Gosling, M.D.  REFERRING PHYSICIAN:  Ninfa Linden, FNP/Edward L. Juanetta Gosling, MD  INDICATION FOR STUDY:  This is a 55 year old who has a previous study showing significant obstructive sleep apnea syndrome.  This is a CPAP titration study.  EPWORTH SLEEPINESS SCORE: 1. BMI 33.  MEDICATIONS:  None.  SLEEP ARCHITECTURE:  This is a CPAP titration recording, the total recording time is 424 minutes.  Sleep efficiency 90%.  Sleep latency 8 minutes.  REM latency 63 minutes.  Stage N1 7.6% and II 45% and III 10% and REM sleep 36%.  RESPIRATORY DATA:  Baseline oxygen saturation 95, lowest saturation 81 during REM sleep.  The patient was placed on positive pressure use and CPAP and titrated between pressures of 5 and 14.  Optimal pressure is 13 with resolution of obstructive events and good tolerance.  LIMB MOVEMENT SUMMARY:  PLM index zero.  ELECTROCARDIOGRAM SUMMARY:  Average heart rate is 90 with occasional PVCs observed.  IMPRESSION:  Obstructive sleep apnea syndrome which responds well to CPAP of 13.     Ayshia Gramlich A. Gerilyn Pilgrim, M.D.    KAD/MEDQ  D:  11/12/2010 21:42:20  T:  11/12/2010 22:00:05  Job:  [id number]

## 2010-12-10 LAB — POCT CARDIAC MARKERS
CKMB, poc: 3.4
Troponin i, poc: <0.05

## 2010-12-10 LAB — DIFFERENTIAL
Basophils Absolute: 0
Basophils Absolute: 0
Basophils Absolute: 0
Basophils Relative: 0
Basophils Relative: 0
Eosinophils Absolute: 0
Eosinophils Relative: 0
Lymphocytes Relative: 43
Lymphocytes Relative: 5 — ABNORMAL LOW
Lymphs Abs: 0.4 — ABNORMAL LOW
Monocytes Absolute: 0.3
Monocytes Absolute: 0.3
Monocytes Absolute: 0.4
Monocytes Relative: 2 — ABNORMAL LOW
Monocytes Relative: 2 — ABNORMAL LOW
Neutro Abs: 15.5 — ABNORMAL HIGH
Neutro Abs: 16.4 — ABNORMAL HIGH
Neutro Abs: 2.5
Neutrophils Relative %: 42 — ABNORMAL LOW
Neutrophils Relative %: 92 — ABNORMAL HIGH
Neutrophils Relative %: 94 — ABNORMAL HIGH
Neutrophils Relative %: 96 — ABNORMAL HIGH

## 2010-12-10 LAB — BASIC METABOLIC PANEL
BUN: 12
CO2: 23
CO2: 26
Calcium: 9
Calcium: 9
Calcium: 9.1
Chloride: 106
Chloride: 107
Creatinine, Ser: 1.22
Creatinine, Ser: 1.29
Creatinine, Ser: 1.32
Creatinine, Ser: 1.39
GFR calc Af Amer: >60
GFR calc Af Amer: >60
GFR calc Af Amer: >60
GFR calc non Af Amer: 53 — ABNORMAL LOW
GFR calc non Af Amer: >60
Glucose, Bld: 106 — ABNORMAL HIGH
Glucose, Bld: 140 — ABNORMAL HIGH
Potassium: 2.8 — ABNORMAL LOW
Sodium: 137
Sodium: 140
Sodium: 140

## 2010-12-10 LAB — BLOOD GAS, ARTERIAL
O2 Saturation: 93.1
Patient temperature: 37
TCO2: 22.8
pH, Arterial: 7.403

## 2010-12-10 LAB — CBC
HCT: 38.5 — ABNORMAL LOW
Hemoglobin: 13.4
Hemoglobin: 13.8
Hemoglobin: 14.8
MCHC: 34.3
MCHC: 34.6
MCV: 92.3
Platelets: 206
RBC: 4.2 — ABNORMAL LOW
RBC: 4.34
RBC: 4.66
RDW: 13.7
RDW: 13.8
WBC: 17.2 — ABNORMAL HIGH
WBC: 4.7

## 2010-12-10 LAB — MAGNESIUM: Magnesium: 1.8

## 2010-12-10 LAB — PHOSPHORUS: Phosphorus: 3.1

## 2011-02-11 ENCOUNTER — Telehealth: Payer: Self-pay

## 2011-02-11 NOTE — Telephone Encounter (Signed)
Called pt on 02/08/2011 and LMOM does he want colonoscopy now or after first of the year.

## 2011-03-06 NOTE — Telephone Encounter (Signed)
Letter mailed to pt to call and schedule appt for colonoscopy.

## 2011-03-26 ENCOUNTER — Ambulatory Visit (INDEPENDENT_AMBULATORY_CARE_PROVIDER_SITE_OTHER): Payer: Medicare Other | Admitting: Orthopedic Surgery

## 2011-03-26 ENCOUNTER — Ambulatory Visit: Payer: Medicare Other | Admitting: Orthopedic Surgery

## 2011-03-26 ENCOUNTER — Encounter: Payer: Self-pay | Admitting: Orthopedic Surgery

## 2011-03-26 VITALS — Ht 70.0 in | Wt 220.0 lb

## 2011-03-26 DIAGNOSIS — M25569 Pain in unspecified knee: Secondary | ICD-10-CM

## 2011-03-26 NOTE — Patient Instructions (Signed)
You have received a steroid shot. 15% of patients experience increased pain at the injection site with in the next 24 hours. This is best treated with ice and tylenol extra strength 2 tabs every 8 hours. If you are still having pain please call the office.   Take vicodin for pain

## 2011-03-27 ENCOUNTER — Other Ambulatory Visit: Payer: Self-pay | Admitting: *Deleted

## 2011-03-27 MED ORDER — HYDROCODONE-ACETAMINOPHEN 5-500 MG PO TABS
2.0000 | ORAL_TABLET | Freq: Four times a day (QID) | ORAL | Status: DC | PRN
Start: 2011-03-27 — End: 2011-05-13

## 2011-04-03 ENCOUNTER — Encounter: Payer: Self-pay | Admitting: Orthopedic Surgery

## 2011-04-03 NOTE — Progress Notes (Signed)
Patient ID: [name], male   DOB: 10-Mar-1956, 56 y.o.   MRN: [id number] Chief complaint severe pain LEFT knee and lower back  This 55 her LEFT American male had a LEFT patellar fracture several years ago was treated with open treatment internal fixation presents now with a one-month history of increasing pain in his lower back and his LEFT knee.  The pain is described as 8/10 throbbing constant and associated with numbness locking swelling increased with standing for long periods and even lying down.  Pain is relieved by Percocet or Vicodin.  Review of systems blurred vision, shortness of breath, wheezing, chills, fatigue, skin rash itching and redness  All other systems reviewed were normal  He is somewhat large male no gross deformities.  Normal pulses and perfusion in the lower and upper extremities with no lymphadenopathy.  Skin normal.  Incision over the LEFT knee normal.  Has no obvious neurologic deficits with normal sensory perception and normal palpation including pressure and pain.  Psych normal.  Gait altered LEFT lower extremity limp.  Inspection reveals tenderness at the inferior pole of patella and along the medial and lateral joint lines.  Range of motion is 118.  Strength is normal.  Knee is stable.  X-rays show large amount of calcification and ossification inferior pole of patella.  Mild degenerative changes as well.  Recommend injection LEFT knee  Vicodin for pain one q.6 #60.  Consult requested by Dr./nurse practitioner Ninfa Linden

## 2011-05-02 ENCOUNTER — Telehealth: Payer: Self-pay

## 2011-05-02 NOTE — Telephone Encounter (Signed)
Pt had been referred by Ninfa Linden, FNP, before Christmas. He wanted to wait until after Christmas sometime to schedule. He said he has family hx of colon cancer ( his brother). He is scheduled for OV on 05/13/2011 @ 11:00 AM with Gerrit Halls, NP. Appt time was faxed to Ninfa Linden.

## 2011-05-09 ENCOUNTER — Telehealth: Payer: Self-pay | Admitting: Orthopedic Surgery

## 2011-05-09 NOTE — Telephone Encounter (Signed)
Okay to refill? 

## 2011-05-09 NOTE — Telephone Encounter (Signed)
Patient has called back about medication refill (Vicodin for pain one q.6 #60); states had pharmacy fax request from [facility], [place]. States has called back there also. Request received? Patient ph# is [phone]

## 2011-05-09 NOTE — Telephone Encounter (Signed)
#  [phone] sent 03/27/11

## 2011-05-12 ENCOUNTER — Encounter: Payer: Self-pay | Admitting: Cardiology

## 2011-05-12 DIAGNOSIS — N182 Chronic kidney disease, stage 2 (mild): Secondary | ICD-10-CM | POA: Insufficient documentation

## 2011-05-12 DIAGNOSIS — Z0189 Encounter for other specified special examinations: Secondary | ICD-10-CM | POA: Insufficient documentation

## 2011-05-13 ENCOUNTER — Ambulatory Visit: Payer: Medicare Other | Admitting: Gastroenterology

## 2011-05-13 ENCOUNTER — Emergency Department (HOSPITAL_COMMUNITY): Payer: Medicare Other

## 2011-05-13 ENCOUNTER — Inpatient Hospital Stay (HOSPITAL_COMMUNITY)
Admission: EM | Admit: 2011-05-13 | Discharge: 2011-05-16 | DRG: 189 | Disposition: A | Discharge status: Home / Self Care | Payer: Medicare Other | Attending: Internal Medicine | Admitting: Internal Medicine

## 2011-05-13 ENCOUNTER — Encounter (HOSPITAL_COMMUNITY): Payer: Self-pay | Admitting: Emergency Medicine

## 2011-05-13 DIAGNOSIS — N182 Chronic kidney disease, stage 2 (mild): Secondary | ICD-10-CM | POA: Diagnosis present

## 2011-05-13 DIAGNOSIS — E669 Obesity, unspecified: Secondary | ICD-10-CM | POA: Diagnosis present

## 2011-05-13 DIAGNOSIS — J441 Chronic obstructive pulmonary disease with (acute) exacerbation: Secondary | ICD-10-CM | POA: Diagnosis present

## 2011-05-13 DIAGNOSIS — F17200 Nicotine dependence, unspecified, uncomplicated: Secondary | ICD-10-CM | POA: Diagnosis present

## 2011-05-13 DIAGNOSIS — J209 Acute bronchitis, unspecified: Secondary | ICD-10-CM | POA: Diagnosis present

## 2011-05-13 DIAGNOSIS — I10 Essential (primary) hypertension: Secondary | ICD-10-CM | POA: Diagnosis present

## 2011-05-13 DIAGNOSIS — G4730 Sleep apnea, unspecified: Secondary | ICD-10-CM | POA: Diagnosis present

## 2011-05-13 DIAGNOSIS — I129 Hypertensive chronic kidney disease with stage 1 through stage 4 chronic kidney disease, or unspecified chronic kidney disease: Secondary | ICD-10-CM | POA: Diagnosis present

## 2011-05-13 DIAGNOSIS — I498 Other specified cardiac arrhythmias: Secondary | ICD-10-CM | POA: Diagnosis present

## 2011-05-13 DIAGNOSIS — E876 Hypokalemia: Secondary | ICD-10-CM | POA: Diagnosis present

## 2011-05-13 DIAGNOSIS — G8929 Other chronic pain: Secondary | ICD-10-CM | POA: Diagnosis present

## 2011-05-13 DIAGNOSIS — M48061 Spinal stenosis, lumbar region without neurogenic claudication: Secondary | ICD-10-CM | POA: Diagnosis present

## 2011-05-13 DIAGNOSIS — J440 Chronic obstructive pulmonary disease with acute lower respiratory infection: Secondary | ICD-10-CM | POA: Diagnosis present

## 2011-05-13 DIAGNOSIS — Z6833 Body mass index (BMI) 33.0-33.9, adult: Secondary | ICD-10-CM

## 2011-05-13 DIAGNOSIS — L408 Other psoriasis: Secondary | ICD-10-CM | POA: Diagnosis present

## 2011-05-13 DIAGNOSIS — Z79899 Other long term (current) drug therapy: Secondary | ICD-10-CM

## 2011-05-13 DIAGNOSIS — J9600 Acute respiratory failure, unspecified whether with hypoxia or hypercapnia: Secondary | ICD-10-CM | POA: Diagnosis present

## 2011-05-13 DIAGNOSIS — IMO0002 Reserved for concepts with insufficient information to code with codable children: Secondary | ICD-10-CM

## 2011-05-13 DIAGNOSIS — J9620 Acute and chronic respiratory failure, unspecified whether with hypoxia or hypercapnia: Principal | ICD-10-CM | POA: Diagnosis present

## 2011-05-13 DIAGNOSIS — R000 Tachycardia, unspecified: Secondary | ICD-10-CM | POA: Diagnosis present

## 2011-05-13 DIAGNOSIS — Z720 Tobacco use: Secondary | ICD-10-CM | POA: Diagnosis present

## 2011-05-13 HISTORY — DX: Aneurysm of carotid artery: I72.0

## 2011-05-13 HISTORY — DX: Sleep apnea, unspecified: G47.30

## 2011-05-13 LAB — MAGNESIUM: Magnesium: 2.2 mg/dL (ref 1.5–2.5)

## 2011-05-13 LAB — DIFFERENTIAL
Basophils Absolute: 0 10*3/uL (ref 0.0–0.1)
Lymphocytes Relative: 31 % (ref 12–46)
Lymphs Abs: 1.1 10*3/uL (ref 0.7–4.0)
Monocytes Absolute: 0.4 10*3/uL (ref 0.1–1.0)
Monocytes Relative: 10 % (ref 3–12)
Neutro Abs: 1.8 10*3/uL (ref 1.7–7.7)

## 2011-05-13 LAB — CBC
HCT: 44.3 % (ref 39.0–52.0)
Hemoglobin: 14.7 g/dL (ref 13.0–17.0)
RBC: 4.91 MIL/uL (ref 4.22–5.81)
RDW: 13.4 % (ref 11.5–15.5)
WBC: 3.6 10*3/uL — ABNORMAL LOW (ref 4.0–10.5)

## 2011-05-13 LAB — URINALYSIS, ROUTINE W REFLEX MICROSCOPIC
Bilirubin Urine: NEGATIVE
Hgb urine dipstick: NEGATIVE
Specific Gravity, Urine: 1.02 (ref 1.005–1.030)
pH: 6 (ref 5.0–8.0)

## 2011-05-13 LAB — BLOOD GAS, ARTERIAL
Drawn by: 21694
O2 Content: 2 L/min
O2 Saturation: 97 %
pO2, Arterial: 93.1 mmHg (ref 80.0–100.0)

## 2011-05-13 LAB — BASIC METABOLIC PANEL
CO2: 28 mEq/L (ref 19–32)
Chloride: 100 mEq/L (ref 96–112)
Creatinine, Ser: 1.32 mg/dL (ref 0.50–1.35)
Glucose, Bld: 99 mg/dL (ref 70–99)

## 2011-05-13 LAB — CARDIAC PANEL(CRET KIN+CKTOT+MB+TROPI)
Relative Index: 2 (ref 0.0–2.5)
Total CK: 242 U/L — ABNORMAL HIGH (ref 7–232)
Troponin I: <0.3 ng/mL (ref <0.30)

## 2011-05-13 LAB — PRO B NATRIURETIC PEPTIDE: Pro B Natriuretic peptide (BNP): 919.8 pg/mL — ABNORMAL HIGH (ref 0–125)

## 2011-05-13 MED ORDER — LEVOFLOXACIN IN D5W 750 MG/150ML IV SOLN
750.0000 mg | INTRAVENOUS | Status: DC
  Administered 2011-05-13 – 2011-05-14 (×2): 750 mg via INTRAVENOUS
  Filled 2011-05-13 (×2): qty 150

## 2011-05-13 MED ORDER — ALBUTEROL SULFATE (5 MG/ML) 0.5% IN NEBU: 5.0000 mg | INHALATION_SOLUTION | RESPIRATORY_TRACT | Status: DC

## 2011-05-13 MED ORDER — ACETAMINOPHEN 650 MG RE SUPP: 650.0000 mg | Freq: Four times a day (QID) | RECTAL | Status: DC | PRN

## 2011-05-13 MED ORDER — GUAIFENESIN ER 600 MG PO TB12
1200.0000 mg | ORAL_TABLET | Freq: Two times a day (BID) | ORAL | Status: DC
  Administered 2011-05-13 – 2011-05-16 (×6): 1200 mg via ORAL
  Filled 2011-05-13 (×6): qty 2

## 2011-05-13 MED ORDER — MORPHINE SULFATE 2 MG/ML IJ SOLN: 2.0000 mg | INTRAMUSCULAR | Status: DC | PRN

## 2011-05-13 MED ORDER — BENAZEPRIL HCL 10 MG PO TABS
20.0000 mg | ORAL_TABLET | Freq: Every day | ORAL | Status: DC
  Administered 2011-05-13 – 2011-05-15 (×3): 20 mg via ORAL
  Filled 2011-05-13 (×3): qty 2

## 2011-05-13 MED ORDER — ALBUTEROL SULFATE (5 MG/ML) 0.5% IN NEBU
5.0000 mg | INHALATION_SOLUTION | RESPIRATORY_TRACT | Status: DC
  Administered 2011-05-13 – 2011-05-15 (×9): 5 mg via RESPIRATORY_TRACT
  Filled 2011-05-13: qty 0.5
  Filled 2011-05-13: qty 1
  Filled 2011-05-13 (×2): qty 0.5
  Filled 2011-05-13 (×3): qty 1
  Filled 2011-05-13 (×2): qty 0.5

## 2011-05-13 MED ORDER — NICOTINE 14 MG/24HR TD PT24
14.0000 mg | MEDICATED_PATCH | TRANSDERMAL | Status: DC
  Administered 2011-05-14 – 2011-05-15 (×2): 14 mg via TRANSDERMAL
  Filled 2011-05-13 (×4): qty 1

## 2011-05-13 MED ORDER — ONDANSETRON HCL 4 MG/2ML IJ SOLN: 4.0000 mg | Freq: Four times a day (QID) | INTRAMUSCULAR | Status: AC | PRN

## 2011-05-13 MED ORDER — ALBUTEROL SULFATE (5 MG/ML) 0.5% IN NEBU
10.0000 mg | INHALATION_SOLUTION | Freq: Once | RESPIRATORY_TRACT | Status: AC
  Administered 2011-05-13: 10 mg via RESPIRATORY_TRACT
  Filled 2011-05-13: qty 2

## 2011-05-13 MED ORDER — METHOTREXATE 2.5 MG PO TABS: 10.0000 mg | ORAL_TABLET | ORAL | Status: DC

## 2011-05-13 MED ORDER — IPRATROPIUM BROMIDE 0.02 % IN SOLN: 0.5000 mg | RESPIRATORY_TRACT | Status: DC

## 2011-05-13 MED ORDER — IPRATROPIUM BROMIDE 0.02 % IN SOLN
0.5000 mg | RESPIRATORY_TRACT | Status: DC
  Administered 2011-05-13 – 2011-05-15 (×9): 0.5 mg via RESPIRATORY_TRACT
  Filled 2011-05-13 (×7): qty 2.5

## 2011-05-13 MED ORDER — IPRATROPIUM BROMIDE 0.02 % IN SOLN
1.0000 mg | Freq: Once | RESPIRATORY_TRACT | Status: AC
  Administered 2011-05-13: 1 mg via RESPIRATORY_TRACT
  Filled 2011-05-13: qty 5

## 2011-05-13 MED ORDER — ALBUTEROL SULFATE (5 MG/ML) 0.5% IN NEBU
2.5000 mg | INHALATION_SOLUTION | Freq: Once | RESPIRATORY_TRACT | Status: AC
  Administered 2011-05-13: 2.5 mg via RESPIRATORY_TRACT
  Filled 2011-05-13: qty 0.5

## 2011-05-13 MED ORDER — VANCOMYCIN HCL 1000 MG IV SOLR
2000.0000 mg | Freq: Once | INTRAVENOUS | Status: AC
  Administered 2011-05-13: 2000 mg via INTRAVENOUS
  Filled 2011-05-13: qty 2000

## 2011-05-13 MED ORDER — ALBUTEROL SULFATE (5 MG/ML) 0.5% IN NEBU: 2.5000 mg | INHALATION_SOLUTION | RESPIRATORY_TRACT | Status: DC | PRN

## 2011-05-13 MED ORDER — HYDROCODONE-ACETAMINOPHEN 5-325 MG PO TABS
1.0000 | ORAL_TABLET | ORAL | Status: DC | PRN
  Administered 2011-05-14: 2 via ORAL
  Administered 2011-05-15: 1 via ORAL
  Filled 2011-05-13: qty 1
  Filled 2011-05-13: qty 2

## 2011-05-13 MED ORDER — POTASSIUM CHLORIDE 20 MEQ/15ML (10%) PO LIQD
40.0000 meq | Freq: Once | ORAL | Status: AC
  Administered 2011-05-13: 40 meq via ORAL
  Filled 2011-05-13: qty 30

## 2011-05-13 MED ORDER — METHYLPREDNISOLONE SODIUM SUCC 125 MG IJ SOLR
125.0000 mg | Freq: Once | INTRAMUSCULAR | Status: AC
  Administered 2011-05-13: 125 mg via INTRAVENOUS
  Filled 2011-05-13: qty 2

## 2011-05-13 MED ORDER — ACETAMINOPHEN 325 MG PO TABS: 650.0000 mg | ORAL_TABLET | Freq: Four times a day (QID) | ORAL | Status: DC | PRN

## 2011-05-13 MED ORDER — VANCOMYCIN HCL IN DEXTROSE 1-5 GM/200ML-% IV SOLN
INTRAVENOUS | Status: AC
  Filled 2011-05-13: qty 400

## 2011-05-13 MED ORDER — SODIUM CHLORIDE 0.9 % IV SOLN: INTRAVENOUS | Status: DC

## 2011-05-13 MED ORDER — ONDANSETRON HCL 4 MG/2ML IJ SOLN: 4.0000 mg | Freq: Three times a day (TID) | INTRAMUSCULAR | Status: DC | PRN

## 2011-05-13 MED ORDER — METHYLPREDNISOLONE SODIUM SUCC 125 MG IJ SOLR
125.0000 mg | Freq: Four times a day (QID) | INTRAMUSCULAR | Status: DC
  Administered 2011-05-13 – 2011-05-14 (×3): 125 mg via INTRAVENOUS
  Filled 2011-05-13 (×3): qty 2

## 2011-05-13 MED ORDER — IPRATROPIUM BROMIDE 0.02 % IN SOLN
0.5000 mg | Freq: Once | RESPIRATORY_TRACT | Status: AC
  Administered 2011-05-13: 0.5 mg via RESPIRATORY_TRACT
  Filled 2011-05-13: qty 2.5

## 2011-05-13 MED ORDER — SODIUM CHLORIDE 0.9 % IV SOLN
INTRAVENOUS | Status: AC
  Administered 2011-05-13: 22:00:00 via INTRAVENOUS

## 2011-05-13 MED ORDER — LEVOFLOXACIN IN D5W 750 MG/150ML IV SOLN
INTRAVENOUS | Status: AC
  Filled 2011-05-13: qty 150

## 2011-05-13 MED ORDER — FOLIC ACID 1 MG PO TABS
1.0000 mg | ORAL_TABLET | Freq: Every day | ORAL | Status: DC
  Administered 2011-05-13 – 2011-05-16 (×4): 1 mg via ORAL
  Filled 2011-05-13 (×4): qty 1

## 2011-05-13 MED ORDER — ENOXAPARIN SODIUM 40 MG/0.4ML ~~LOC~~ SOLN
40.0000 mg | SUBCUTANEOUS | Status: DC
  Administered 2011-05-13 – 2011-05-15 (×3): 40 mg via SUBCUTANEOUS
  Filled 2011-05-13 (×3): qty 0.4

## 2011-05-13 MED ORDER — PANTOPRAZOLE SODIUM 40 MG PO TBEC
40.0000 mg | DELAYED_RELEASE_TABLET | Freq: Every day | ORAL | Status: DC
  Administered 2011-05-13 – 2011-05-15 (×3): 40 mg via ORAL
  Filled 2011-05-13 (×3): qty 1

## 2011-05-13 NOTE — ED Notes (Signed)
Report received from sandra, rn. Stated she has given report to floor nurse and is going to let him eat dinner before taking him up to floor.

## 2011-05-13 NOTE — ED Notes (Signed)
Pt reports cough/congestion for the past few days.  Pt wears o2 at home all the time.  Pt reports sob at home that is not getting any better with his breathing treatments.  Pt denies any fever.

## 2011-05-13 NOTE — H&P (Signed)
PCP:   Ninfa Linden, NP, NP   Chief Complaint:  SOB for 1 week.  HPI: This pleasant 56 year old male with chronic respiratory failure due to COPD, on home O2, comes in with difficulty breathing, cough productive of clear colored phlegm, subjective fever, no hemoptysis, ongoing for a week. No chest pain. At time of evaluation he is quite dyspneic, barely completing sentences. He denies diarrhea or vomiting or dysuria. CXR did not show any obvious infiltrate.  Review of Systems:  Unremarkable except as in hpi.  Past Medical History: Past Medical History  Diagnosis Date  . Hypertension   . Dyspnea   . Chronic kidney disease, stage 2, mildly decreased GFR     Creatinine of 1.31 in 04/2011  . COPD (chronic obstructive pulmonary disease)     moderate by spirometry in 08/2009;FEV1 of 1.1 ;nl alpha -1 antitrypsin   . Obesity   . Psoriasis   . Left eye trauma     with loss of vision  . Erectile dysfunction   . Lumbosacral spinal stenosis     chronic low back pain  . Headache   . Sleep apnea   . Carotid artery aneurysm    Past Surgical History  Procedure Date  . Orif patella 12/14/2005    left fracture Dr.Harrison   . Ophthalmologic surgery 1963    secondary to left eye trauma    Medications: Prior to Admission medications   Medication Sig Start Date End Date Taking? Authorizing Provider  albuterol (VENTOLIN HFA) 108 (90 BASE) MCG/ACT inhaler Inhale 2 puffs into the lungs every 4 (four) hours as needed. For shortness of breath   Yes Historical Provider, MD  benazepril (LOTENSIN) 20 MG tablet Take 20 mg by mouth at bedtime.    Yes Historical Provider, MD  Fluticasone-Salmeterol (ADVAIR DISKUS) 250-50 MCG/DOSE AEPB Inhale 1 puff into the lungs every 12 (twelve) hours.     Yes Historical Provider, MD  folic acid (FOLVITE) 1 MG tablet Take 1 mg by mouth daily.   Yes Historical Provider, MD  ipratropium-albuterol (DUONEB) 0.5-2.5 (3) MG/3ML SOLN Take 3 mLs by nebulization 2 (two)  times daily as needed. For shortness of breath   Yes Historical Provider, MD  methotrexate (RHEUMATREX) 2.5 MG tablet Take 10 mg by mouth once a week. On saturdays Caution:Chemotherapy. Protect from light.   Yes Historical Provider, MD  nicotine (NICODERM CQ - DOSED IN MG/24 HOURS) 14 mg/24hr patch Place 1 patch onto the skin daily.   Yes Historical Provider, MD  predniSONE (DELTASONE) 10 MG tablet Take 10-40 mg by mouth daily as needed. Taper as needed for respiratory as directed Take 4 tablets for 3 days, 3 tablets for 3 days , 2 tablets for 2 days, 1 tablet for 3 days   Yes Historical Provider, MD    Allergies:   Allergies  Allergen Reactions  . Bee Venom Anaphylaxis    Patient has an epi pen    Social History:  reports that he has been smoking.  He has never used smokeless tobacco. He reports that he drinks about .5 ounces of alcohol per week. He reports that he does not use illicit drugs.   Family History: Family History  Problem Relation Age of Onset  . Heart disease    . Arthritis    . Lung disease    . Cancer    . Asthma      Physical Exam: Filed Vitals:   05/13/11 1900 05/13/11 1950 05/13/11 2000 05/13/11 2100  BP: 154/103   138/91  Pulse: 105     Temp:  98.1 F (36.7 C) 98.1 F (36.7 C)   TempSrc:  Oral Oral   Resp:    17  Height:  5\' 9"  (1.753 m)    Weight:  99 kg (218 lb 4.1 oz)    SpO2: 98%      Productive Cough with copious clear phlegm in bed side receiver. Dry oral mucosa. No JVD. No carotid bruits. Tachypneic, Reduced air entry bilaterally. Scant wheezing bilaterally. S1S2. Regular tachycardia. No murmurs. Abdomen- benign. CNS- grossly intact. Extremities- no pedal edema. Peripheral pulses equal.   Labs on Admission:   [facility] 05/13/11 1456  NA 138  K 3.3*  CL 100  CO2 28  GLUCOSE 99  BUN 10  CREATININE 1.32  CALCIUM 9.2  MG --  PHOS --   No results found for this basename: AST:2,ALT:2,ALKPHOS:2,BILITOT:2,PROT:2,ALBUMIN:2 in the last 72  hours No results found for this basename: LIPASE:2,AMYLASE:2 in the last 72 hours  Basename 05/13/11 1456  WBC 3.6*  NEUTROABS 1.8  HGB 14.7  HCT 44.3  MCV 90.2  PLT 238   No results found for this basename: CKTOTAL:3,CKMB:3,CKMBINDEX:3,TROPONINI:3 in the last 72 hours No results found for this basename: TSH,T4TOTAL,FREET3,T3FREE,THYROIDAB in the last 72 hours No results found for this basename: VITAMINB12:2,FOLATE:2,FERRITIN:2,TIBC:2,IRON:2,RETICCTPCT:2 in the last 72 hours  Radiological Exams on Admission: Dg Chest 2 View  05/13/2011  *RADIOLOGY REPORT*  Clinical Data: Shortness of breath, cough  CHEST - 2 VIEW  Comparison: Multiple prior studies, most recently 07/24/2010  Findings: Apparent opacity overlying the right mid lung reflects overlying soft tissues, and is similar to the 2010 study.  Lungs are essentially clear.  No pleural effusion or pneumothorax.  Cardiomediastinal silhouette is within normal limits.  Mild degenerative changes of the visualized thoracolumbar spine.  IMPRESSION: No evidence of acute cardiopulmonary disease.  Original Report Authenticated By: Charline Bills, M.D.    Assessment/Plan Acute exacerbation of COPD,  Possible early PNA in active smoker ?hospitalised in the last 2-3 months . Copious phlegm. Concern for nosocomial organisms. Tenuous respiratory state. May need and is agreeable to vent support if necessary. Plan .COPD (chronic obstructive pulmonary disease) with acute bronchitis- admit sdu. Sputum culture. Vanc/levaquin/steroids/bipap/abg/bronchodilators. .Hypokalemia- ? Poor oral intake. Replenish. .HTN (hypertension), malignant- resume home meds. .Tobacco abuse disorder- smoking cessation counseling given. Nicotine patch. Dvt/gi prophylaxis. Condition critical, time spent 55 minutes.Discussed patient's condition and care plan with his Phineas Douglas by phone, per patient's request.  Conley Canal [phone]. 05/13/2011, 9:55 PM

## 2011-05-13 NOTE — ED Notes (Signed)
Patient transported to X-ray 

## 2011-05-13 NOTE — ED Notes (Signed)
Pt c/o sob, cough and congestion x 2 weeks.

## 2011-05-13 NOTE — ED Notes (Signed)
States his ribs/ chest hurt when coughing, waiting for edp evaluation

## 2011-05-13 NOTE — ED Provider Notes (Signed)
History     CSN: [id number]  Arrival date & time 05/13/11  1257   First MD Initiated Contact with Patient 05/13/11 1456      Chief Complaint  Patient presents with  . Shortness of Breath  . Cough    HPI Pt was seen at 1530.  Per pt, c/o gradual onset and worsening of persistent SOB, wheezing and cough for the past 1 to 2 weeks.  States he has been using his home MDI, nebs and PO prednisone without relief of symptoms.  Denies CP/palpitations, no abd pain, no N/V/D, no fevers, no rash, no back pain.     PMD:  Katy Fitch Past Medical History  Diagnosis Date  . Hypertension   . Dyspnea   . Chronic kidney disease, stage 2, mildly decreased GFR     Creatinine of 1.31 in 04/2011  . COPD (chronic obstructive pulmonary disease)     moderate by spirometry in 08/2009;FEV1 of 1.1 ;nl alpha -1 antitrypsin   . Obesity   . Psoriasis   . Left eye trauma     with loss of vision  . Erectile dysfunction   . Lumbosacral spinal stenosis     chronic low back pain  . Headache   . Sleep apnea   . Carotid artery aneurysm     Past Surgical History  Procedure Date  . Orif patella 12/14/2005    left fracture Dr.Harrison   . Ophthalmologic surgery 1963    secondary to left eye trauma    Family History  Problem Relation Age of Onset  . Heart disease    . Arthritis    . Lung disease    . Cancer    . Asthma      History  Substance Use Topics  . Smoking status: Current Everyday Smoker -- 1.0 packs/day for 20 years    Last Attempt to Quit: 06/10/2010  . Smokeless tobacco: Never Used   Comment: Quit in 06/2010; may have subsequently resumed  . Alcohol Use: 0.5 oz/week    1 drink(s) per week    Review of Systems ROS: Statement: All systems negative except as marked or noted in the HPI; Constitutional: Negative for fever and chills. ; ; Eyes: Negative for eye pain, redness and discharge. ; ; ENMT: Negative for ear pain, hoarseness, nasal congestion, sinus pressure and sore throat. ; ;  Cardiovascular: Negative for chest pain, palpitations, diaphoresis, and peripheral edema. ; ; Respiratory: Positive for cough, wheezing, SOB; negative for stridor. ; ; Gastrointestinal: Negative for nausea, vomiting, diarrhea, abdominal pain, blood in stool, hematemesis, jaundice and rectal bleeding. . ; ; Genitourinary: Negative for dysuria, flank pain and hematuria. ; ; Musculoskeletal: Negative for back pain and neck pain. Negative for swelling and trauma.; ; Skin: Negative for pruritus, rash, abrasions, blisters, bruising and skin lesion.; ; Neuro: Negative for headache, lightheadedness and neck stiffness. Negative for weakness, altered level of consciousness , altered mental status, extremity weakness, paresthesias, involuntary movement, seizure and syncope.     Allergies  Bee venom  Home Medications   Current Outpatient Rx  Name Route Sig Dispense Refill  . ALBUTEROL SULFATE HFA 108 (90 BASE) MCG/ACT IN AERS Inhalation Inhale 2 puffs into the lungs every 4 (four) hours as needed. For shortness of breath    . BENAZEPRIL HCL 20 MG PO TABS Oral Take 20 mg by mouth at bedtime.     Marland Kitchen FLUTICASONE-SALMETEROL 250-50 MCG/DOSE IN AEPB Inhalation Inhale 1 puff into the lungs every 12 (twelve)  hours.      Marland Kitchen FOLIC ACID 1 MG PO TABS Oral Take 1 mg by mouth daily.    . IPRATROPIUM-ALBUTEROL 0.5-2.5 (3) MG/3ML IN SOLN Nebulization Take 3 mLs by nebulization 2 (two) times daily as needed. For shortness of breath    . METHOTREXATE 2.5 MG PO TABS Oral Take 10 mg by mouth once a week. On saturdays Caution:Chemotherapy. Protect from light.    Marland Kitchen NICOTINE 14 MG/24HR TD PT24 Transdermal Place 1 patch onto the skin daily.    Marland Kitchen PREDNISONE 10 MG PO TABS Oral Take 10-40 mg by mouth daily as needed. Taper as needed for respiratory as directed Take 4 tablets for 3 days, 3 tablets for 3 days , 2 tablets for 2 days, 1 tablet for 3 days      BP 170/116  Pulse 115  Temp(Src) 98.4 F (36.9 C) (Oral)  Resp 26  Ht 5\' 9"   (1.753 m)  Wt 219 lb (99.338 kg)  BMI 32.34 kg/m2  SpO2 96%  Physical Exam 1535: Physical examination:  Nursing notes reviewed; Vital signs and O2 SAT reviewed;  Constitutional: Well developed, Well nourished, Uncomfortable appearing; Head:  Normocephalic, atraumatic; Eyes: EOMI, PERRL, No scleral icterus; ENMT: Mouth and pharynx normal, Mucous membranes dry; Neck: Supple, Full range of motion, No lymphadenopathy; Cardiovascular: +tachycardic rate and rhythm, No murmur or gallop; Respiratory: Breath sounds coarse & equal bilaterally with insp/exp wheezes bilat, +audible wheezing, speaking in long phrases, +tachypneic; Chest: Nontender, Movement normal; Abdomen: Soft, Nontender, Nondistended, Normal bowel sounds; Extremities: Pulses normal, No tenderness, No edema, No calf edema or asymmetry.; Neuro: AA&Ox3, Major CN grossly intact. Speech clear, no facial droop. No gross focal motor or sensory deficits in extremities.; Skin: Color normal, Warm, Dry, no rash.   ED Course  Procedures   1540:  Pt wheezing on exam.  IV solumedrol and continuous neb ordered.  1800:  Pt on continuous neb.  States he "still doesn't feel that great."  No longer audibly wheezing, lungs coarse with exp wheezes bilat.  Dx testing d/w pt.  Questions answered.  Verb understanding, agreeable to admit.  T/C to [facility] Dr. Lendell Caprice, case discussed, including:  HPI, pertinent PM/SHx, VS/PE, dx testing, ED course and treatment:  Agreeable to admit, requests to write temporary orders, obtain step down bed.    MDM  MDM Reviewed: previous chart, nursing note and vitals Interpretation: labs and x-ray Total time providing critical care: 30-74 minutes. This excludes time spent performing separately reportable procedures and services. Consults: admitting MD   CRITICAL CARE Performed by: Laray Anger Total critical care time: 35 Critical care time was exclusive of separately billable procedures and treating other  patients. Critical care was necessary to treat or prevent imminent or life-threatening deterioration. Critical care was time spent personally by me on the following activities: development of treatment plan with patient and/or surrogate as well as nursing, discussions with consultants, evaluation of patient's response to treatment, examination of patient, obtaining history from patient or surrogate, ordering and performing treatments and interventions, ordering and review of laboratory studies, ordering and review of radiographic studies, pulse oximetry and re-evaluation of patient's condition.   Results for orders placed during the hospital encounter of 05/13/11  CBC      Component Value Range   WBC 3.6 (*) 4.0 - 10.5 (K/uL)   RBC 4.91  4.22 - 5.81 (MIL/uL)   Hemoglobin 14.7  13.0 - 17.0 (g/dL)   HCT 16.1  09.6 - 04.5 (%)   MCV 90.2  78.0 - 100.0 (fL)   MCH 29.9  26.0 - 34.0 (pg)   MCHC 33.2  30.0 - 36.0 (g/dL)   RDW 16.1  09.6 - 04.5 (%)   Platelets 238  150 - 400 (K/uL)  DIFFERENTIAL      Component Value Range   Neutrophils Relative 50  43 - 77 (%)   Neutro Abs 1.8  1.7 - 7.7 (K/uL)   Lymphocytes Relative 31  12 - 46 (%)   Lymphs Abs 1.1  0.7 - 4.0 (K/uL)   Monocytes Relative 10  3 - 12 (%)   Monocytes Absolute 0.4  0.1 - 1.0 (K/uL)   Eosinophils Relative 8 (*) 0 - 5 (%)   Eosinophils Absolute 0.3  0.0 - 0.7 (K/uL)   Basophils Relative 1  0 - 1 (%)   Basophils Absolute 0.0  0.0 - 0.1 (K/uL)  BASIC METABOLIC PANEL      Component Value Range   Sodium 138  135 - 145 (mEq/L)   Potassium 3.3 (*) 3.5 - 5.1 (mEq/L)   Chloride 100  96 - 112 (mEq/L)   CO2 28  19 - 32 (mEq/L)   Glucose, Bld 99  70 - 99 (mg/dL)   BUN 10  6 - 23 (mg/dL)   Creatinine, Ser 4.09  0.50 - 1.35 (mg/dL)   Calcium 9.2  8.4 - 81.1 (mg/dL)   GFR calc non Af Amer 59 (*) >90 (mL/min)   GFR calc Af Amer 68 (*) >90 (mL/min)   Dg Chest 2 View 05/13/2011  *RADIOLOGY REPORT*  Clinical Data: Shortness of breath, cough   CHEST - 2 VIEW  Comparison: Multiple prior studies, most recently 07/24/2010  Findings: Apparent opacity overlying the right mid lung reflects overlying soft tissues, and is similar to the 2010 study.  Lungs are essentially clear.  No pleural effusion or pneumothorax.  Cardiomediastinal silhouette is within normal limits.  Mild degenerative changes of the visualized thoracolumbar spine.  IMPRESSION: No evidence of acute cardiopulmonary disease.  Original Report Authenticated By: Charline Bills, M.D.             Laray Anger, DO 05/15/11 0015

## 2011-05-14 ENCOUNTER — Encounter (HOSPITAL_COMMUNITY): Payer: Self-pay | Admitting: *Deleted

## 2011-05-14 LAB — URINE CULTURE
Culture  Setup Time: 201303042315
Culture: NO GROWTH

## 2011-05-14 LAB — COMPREHENSIVE METABOLIC PANEL
AST: 22 U/L (ref 0–37)
Albumin: 3.3 g/dL — ABNORMAL LOW (ref 3.5–5.2)
Calcium: 9 mg/dL (ref 8.4–10.5)
Creatinine, Ser: 1.13 mg/dL (ref 0.50–1.35)

## 2011-05-14 LAB — CBC
Hemoglobin: 13.6 g/dL (ref 13.0–17.0)
MCH: 29.6 pg (ref 26.0–34.0)
MCV: 90.4 fL (ref 78.0–100.0)
RBC: 4.59 MIL/uL (ref 4.22–5.81)

## 2011-05-14 LAB — CARDIAC PANEL(CRET KIN+CKTOT+MB+TROPI)
CK, MB: 6 ng/mL — ABNORMAL HIGH (ref 0.3–4.0)
Troponin I: <0.3 ng/mL (ref <0.30)

## 2011-05-14 LAB — TSH: TSH: 0.225 u[IU]/mL — ABNORMAL LOW (ref 0.350–4.500)

## 2011-05-14 LAB — PROTIME-INR: Prothrombin Time: 13.3 seconds (ref 11.6–15.2)

## 2011-05-14 LAB — EXPECTORATED SPUTUM ASSESSMENT W GRAM STAIN, RFLX TO RESP C

## 2011-05-14 MED ORDER — AMLODIPINE BESYLATE 5 MG PO TABS: 5.0000 mg | ORAL_TABLET | Freq: Every day | ORAL | Status: DC

## 2011-05-14 MED ORDER — METHYLPREDNISOLONE SODIUM SUCC 125 MG IJ SOLR
125.0000 mg | Freq: Three times a day (TID) | INTRAMUSCULAR | Status: DC
  Administered 2011-05-14 – 2011-05-15 (×3): 125 mg via INTRAVENOUS
  Filled 2011-05-14 (×3): qty 2

## 2011-05-14 MED ORDER — VANCOMYCIN HCL 1000 MG IV SOLR
1250.0000 mg | Freq: Two times a day (BID) | INTRAVENOUS | Status: DC
  Administered 2011-05-14 – 2011-05-15 (×4): 1250 mg via INTRAVENOUS
  Filled 2011-05-14 (×7): qty 1250

## 2011-05-14 MED ORDER — BUDESONIDE-FORMOTEROL FUMARATE 160-4.5 MCG/ACT IN AERO
2.0000 | INHALATION_SPRAY | Freq: Two times a day (BID) | RESPIRATORY_TRACT | Status: DC
  Administered 2011-05-14 – 2011-05-15 (×3): 2 via RESPIRATORY_TRACT
  Filled 2011-05-14: qty 6

## 2011-05-14 NOTE — Progress Notes (Signed)
Subjective: Pt mentions that he feels better today.  He reports that he is still having productive cough.  Reports that he was around a sick contact recently (his nephew) prior to admission.  No other acute complaints at this time.  Denies any hemoptysis.  Objective: Filed Vitals:   05/14/11 0400 05/14/11 0500 05/14/11 0600 05/14/11 0809  BP: 124/82 137/74 161/102   Pulse: 82 87 87   Temp: 98.2 F (36.8 C)     TempSrc: Axillary     Resp: 17 19 17    Height:      Weight:  100.7 kg (222 lb 0.1 oz)    SpO2: 97% 97% 98% 97%   Weight change:   Intake/Output Summary (Last 24 hours) at 05/14/11 0928 Last data filed at 05/14/11 0600  Gross per 24 hour  Intake   1460 ml  Output    350 ml  Net   1110 ml    General: Alert, awake, oriented x3, in no acute distress.  HEENT: No bruits, no goiter.  Heart: Regular rate and rhythm, without murmurs, rubs, gallops.  Lungs: Prolonged expiratory phase, expiratory wheezes diffusely.  Abdomen: Soft, nontender, nondistended, positive bowel sounds.  Neuro: Grossly intact, nonfocal.   Lab Results:  Basename 05/14/11 0535 05/13/11 2205 05/13/11 1456  NA 135 -- 138  K 3.9 -- 3.3*  CL 101 -- 100  CO2 25 -- 28  GLUCOSE 140* -- 99  BUN 13 -- 10  CREATININE 1.13 -- 1.32  CALCIUM 9.0 -- 9.2  MG -- 2.2 --  PHOS -- 1.9* --    Basename 05/14/11 0535  AST 22  ALT 22  ALKPHOS 67  BILITOT 0.3  PROT 6.9  ALBUMIN 3.3*   No results found for this basename: LIPASE:2,AMYLASE:2 in the last 72 hours  Basename 05/14/11 0535 05/13/11 1456  WBC 2.9* 3.6*  NEUTROABS -- 1.8  HGB 13.6 14.7  HCT 41.5 44.3  MCV 90.4 90.2  PLT 217 238    Basename 05/14/11 0535 05/13/11 2156  CKTOTAL 237* 242*  CKMB 5.5* 4.8*  CKMBINDEX -- --  TROPONINI <0.30 <0.30   No components found with this basename: POCBNP:3 No results found for this basename: DDIMER:2 in the last 72 hours No results found for this basename: HGBA1C:2 in the last 72 hours No results found  for this basename: CHOL:2,HDL:2,LDLCALC:2,TRIG:2,CHOLHDL:2,LDLDIRECT:2 in the last 72 hours No results found for this basename: TSH,T4TOTAL,FREET3,T3FREE,THYROIDAB in the last 72 hours No results found for this basename: VITAMINB12:2,FOLATE:2,FERRITIN:2,TIBC:2,IRON:2,RETICCTPCT:2 in the last 72 hours  Micro Results: Recent Results (from the past 240 hour(s))  MRSA PCR SCREENING     Status: Normal   Collection Time   05/13/11  7:53 PM      Component Value Range Status Comment   MRSA by PCR NEGATIVE  NEGATIVE  Final   CULTURE, SPUTUM-ASSESSMENT     Status: Normal (Preliminary result)   Collection Time   05/13/11 11:34 PM      Component Value Range Status Comment   Specimen Description Expect. Sput   Final    Special Requests NONE   Final    Sputum evaluation     Final    Value: THIS SPECIMEN IS ACCEPTABLE. RESPIRATORY CULTURE REPORT TO FOLLOW.   Report Status PENDING   Incomplete   CULTURE, RESPIRATORY     Status: Normal (Preliminary result)   Collection Time   05/14/11  2:00 AM      Component Value Range Status Comment   Specimen Description SPUTUM EXPECTORATED  Final    Special Requests PENDING   Incomplete    Gram Stain PENDING   Incomplete    Culture PENDING   Incomplete    Report Status PENDING   Incomplete     Studies/Results: Dg Chest 2 View  05/13/2011  *RADIOLOGY REPORT*  Clinical Data: Shortness of breath, cough  CHEST - 2 VIEW  Comparison: Multiple prior studies, most recently 07/24/2010  Findings: Apparent opacity overlying the right mid lung reflects overlying soft tissues, and is similar to the 2010 study.  Lungs are essentially clear.  No pleural effusion or pneumothorax.  Cardiomediastinal silhouette is within normal limits.  Mild degenerative changes of the visualized thoracolumbar spine.  IMPRESSION: No evidence of acute cardiopulmonary disease.  Original Report Authenticated By: Charline Bills, M.D.    Medications: I have reviewed the patient's current  medications.   Patient Active Hospital Problem List: COPD (chronic obstructive pulmonary disease) with acute bronchitis (05/13/2011) - At this point will decrease patient's current IV steroid regimen from q 6hrs to q 8 hrs - add symbicort - continue albuterol and atrovent - continue Levaquin  - Monitor vitals and clinical progression.  Hypokalemia (05/13/2011) Has resolved and was thought to be secondary to poor oral intake.  HTN (hypertension), malignant (05/13/2011) Pt is currently on benazepril.  Given his history of COPD I would avoid B blockers.  Should his coughing persist or get worse despite current regimen I would consider taking patient off of benazepril.  At this juncture given that his last two reading have been elevated I will plan on starting amlodipine and continue to monitor the patient's blood pressures.  Tobacco abuse disorder (05/13/2011) Recommend tobacco cessation.     LOS: 1 day   Penny Pia M.D.  [facility] 05/14/2011, 9:28 AM

## 2011-05-14 NOTE — Progress Notes (Signed)
Patient wanted bipap off he stated to the nurse he couldn't tolerated the machine anymore

## 2011-05-14 NOTE — Consult Note (Signed)
ANTIBIOTIC CONSULT NOTE - INITIAL  Pharmacy Consult for Vancomycin Indication: rule out pneumonia  Allergies  Allergen Reactions  . Bee Venom Anaphylaxis    Patient has an epi pen    Patient Measurements: Height: 5\' 9"  (175.3 cm) Weight: 222 lb 0.1 oz (100.7 kg) IBW/kg (Calculated) : 70.7   Vital Signs: Temp: 98.2 F (36.8 C) (03/05 0400) Temp src: Axillary (03/05 0400) BP: 161/102 mmHg (03/05 0600) Pulse Rate: 87  (03/05 0600) Intake/Output from previous day: 03/04 0701 - 03/05 0700 In: 1460 [P.O.:240; I.V.:570; IV Piggyback:650] Out: 350 [Urine:350] Intake/Output from this shift:    Labs:  Basename 05/14/11 0535 05/13/11 1456  WBC 2.9* 3.6*  HGB 13.6 14.7  PLT 217 238  LABCREA -- --  CREATININE 1.13 1.32   Estimated Creatinine Clearance: 85.4 ml/min (by C-G formula based on Cr of 1.13). No results found for this basename: VANCOTROUGH:2,VANCOPEAK:2,VANCORANDOM:2,GENTTROUGH:2,GENTPEAK:2,GENTRANDOM:2,TOBRATROUGH:2,TOBRAPEAK:2,TOBRARND:2,AMIKACINPEAK:2,AMIKACINTROU:2,AMIKACIN:2, in the last 72 hours   Microbiology: Recent Results (from the past 720 hour(s))  MRSA PCR SCREENING     Status: Normal   Collection Time   05/13/11  7:53 PM      Component Value Range Status Comment   MRSA by PCR NEGATIVE  NEGATIVE  Final   CULTURE, SPUTUM-ASSESSMENT     Status: Normal (Preliminary result)   Collection Time   05/13/11 11:34 PM      Component Value Range Status Comment   Specimen Description Expect. Sput   Final    Special Requests NONE   Final    Sputum evaluation     Final    Value: THIS SPECIMEN IS ACCEPTABLE. RESPIRATORY CULTURE REPORT TO FOLLOW.   Report Status PENDING   Incomplete   CULTURE, RESPIRATORY     Status: Normal (Preliminary result)   Collection Time   05/14/11  2:00 AM      Component Value Range Status Comment   Specimen Description SPUTUM EXPECTORATED   Final    Special Requests PENDING   Incomplete    Gram Stain PENDING   Incomplete    Culture PENDING    Incomplete    Report Status PENDING   Incomplete    Medical History: Past Medical History  Diagnosis Date  . Hypertension   . Dyspnea   . Chronic kidney disease, stage 2, mildly decreased GFR     Creatinine of 1.31 in 04/2011  . COPD (chronic obstructive pulmonary disease)     moderate by spirometry in 08/2009;FEV1 of 1.1 ;nl alpha -1 antitrypsin   . Obesity   . Psoriasis   . Left eye trauma     with loss of vision  . Erectile dysfunction   . Lumbosacral spinal stenosis     chronic low back pain  . Headache   . Sleep apnea   . Carotid artery aneurysm     Medications:  Scheduled:    . sodium chloride   Intravenous STAT  . albuterol  10 mg Nebulization Once  . albuterol  2.5 mg Nebulization Once  . albuterol  5 mg Nebulization Q4H  . benazepril  20 mg Oral QHS  . enoxaparin  40 mg Subcutaneous Q24H  . folic acid  1 mg Oral Daily  . guaiFENesin  1,200 mg Oral BID  . ipratropium  0.5 mg Nebulization Once  . ipratropium  0.5 mg Nebulization Q4H  . ipratropium  1 mg Nebulization Once  . levofloxacin (LEVAQUIN) IV  750 mg Intravenous Q24H  . methotrexate  10 mg Oral Weekly  . methylPREDNISolone (SOLU-MEDROL)  injection  125 mg Intravenous Once  . methylPREDNISolone (SOLU-MEDROL) injection  125 mg Intravenous Q6H  . nicotine  14 mg Transdermal Q24H  . pantoprazole  40 mg Oral Q1200  . potassium chloride  40 mEq Oral Once  . vancomycin  1,250 mg Intravenous Q12H  . vancomycin  2,000 mg Intravenous Once  . DISCONTD: sodium chloride   Intravenous STAT  . DISCONTD: albuterol  5 mg Nebulization Q4H  . DISCONTD: albuterol  5 mg Nebulization Q4H  . DISCONTD: ipratropium  0.5 mg Nebulization Q4H   Assessment: Good renal fxn (ClCr > 80)  Goal of Therapy:  Vancomycin trough level 15-20 mcg/ml  Plan: Vancomycin 1250mg  iv q12hrs Check trough at steady state.  Margo Aye, Andrea Colglazier A 05/14/2011,7:41 AM

## 2011-05-15 DIAGNOSIS — R000 Tachycardia, unspecified: Secondary | ICD-10-CM | POA: Diagnosis present

## 2011-05-15 DIAGNOSIS — J9600 Acute respiratory failure, unspecified whether with hypoxia or hypercapnia: Secondary | ICD-10-CM | POA: Diagnosis present

## 2011-05-15 LAB — VANCOMYCIN, TROUGH: Vancomycin Tr: 14.6 ug/mL (ref 10.0–20.0)

## 2011-05-15 MED ORDER — IPRATROPIUM BROMIDE 0.02 % IN SOLN
0.5000 mg | Freq: Four times a day (QID) | RESPIRATORY_TRACT | Status: DC
  Administered 2011-05-15 – 2011-05-16 (×3): 0.5 mg via RESPIRATORY_TRACT
  Filled 2011-05-15 (×3): qty 2.5

## 2011-05-15 MED ORDER — LEVALBUTEROL HCL 0.63 MG/3ML IN NEBU
0.6300 mg | INHALATION_SOLUTION | RESPIRATORY_TRACT | Status: DC
  Administered 2011-05-15 – 2011-05-16 (×3): 0.63 mg via RESPIRATORY_TRACT
  Filled 2011-05-15 (×4): qty 3

## 2011-05-15 MED ORDER — AMLODIPINE BESYLATE 5 MG PO TABS
10.0000 mg | ORAL_TABLET | Freq: Every day | ORAL | Status: DC
  Administered 2011-05-15 – 2011-05-16 (×2): 10 mg via ORAL
  Filled 2011-05-15 (×2): qty 2

## 2011-05-15 MED ORDER — LEVOFLOXACIN 250 MG PO TABS
750.0000 mg | ORAL_TABLET | Freq: Every day | ORAL | Status: DC
  Administered 2011-05-15 – 2011-05-16 (×2): 750 mg via ORAL
  Filled 2011-05-15 (×2): qty 3

## 2011-05-15 MED ORDER — CLONIDINE HCL 0.1 MG PO TABS
0.1000 mg | ORAL_TABLET | Freq: Four times a day (QID) | ORAL | Status: DC | PRN
Start: 2011-05-15 — End: 2011-05-16
  Administered 2011-05-16: 0.1 mg via ORAL
  Filled 2011-05-15: qty 1

## 2011-05-15 MED ORDER — PREDNISONE 20 MG PO TABS
60.0000 mg | ORAL_TABLET | Freq: Two times a day (BID) | ORAL | Status: DC
Start: 2011-05-15 — End: 2011-05-16
  Administered 2011-05-15 – 2011-05-16 (×3): 60 mg via ORAL
  Filled 2011-05-15 (×3): qty 3

## 2011-05-15 NOTE — Progress Notes (Signed)
ANTIBIOTIC CONSULT NOTE -   Pharmacy Consult for Vancomycin Indication: rule out pneumonia  Allergies  Allergen Reactions  . Bee Venom Anaphylaxis    Patient has an epi pen    Patient Measurements: Height: 5\' 9"  (175.3 cm) Weight: 223 lb 12.3 oz (101.5 kg) IBW/kg (Calculated) : 70.7   Vital Signs: Temp: 98.4 F (36.9 C) (03/06 0800) Temp src: Oral (03/06 0800) BP: 160/104 mmHg (03/06 0954) Pulse Rate: 118  (03/06 0800) Intake/Output from previous day: 03/05 0701 - 03/06 0700 In: 1955 [P.O.:480; IV Piggyback:650] Out: 2600 [Urine:2600] Intake/Output from this shift: Total I/O In: 360 [P.O.:360] Out: 150 [Urine:150]  Labs:  Basename 05/14/11 0535 05/13/11 1456  WBC 2.9* 3.6*  HGB 13.6 14.7  PLT 217 238  LABCREA -- --  CREATININE 1.13 1.32   Estimated Creatinine Clearance: 85.7 ml/min (by C-G formula based on Cr of 1.13).  Basename 05/15/11 0936  VANCOTROUGH 14.6  VANCOPEAK --  Drue Dun --  GENTTROUGH --  GENTPEAK --  GENTRANDOM --  TOBRATROUGH --  TOBRAPEAK --  TOBRARND --  AMIKACINPEAK --  AMIKACINTROU --  AMIKACIN --     Microbiology: Recent Results (from the past 720 hour(s))  MRSA PCR SCREENING     Status: Normal   Collection Time   05/13/11  7:53 PM      Component Value Range Status Comment   MRSA by PCR NEGATIVE  NEGATIVE  Final   URINE CULTURE     Status: Normal   Collection Time   05/13/11 10:52 PM      Component Value Range Status Comment   Specimen Description URINE, CLEAN CATCH   Final    Special Requests NONE   Final    Culture  Setup Time [id number]   Final    Colony Count NO GROWTH   Final    Culture NO GROWTH   Final    Report Status 05/14/2011 FINAL   Final   CULTURE, SPUTUM-ASSESSMENT     Status: Normal   Collection Time   05/13/11 11:34 PM      Component Value Range Status Comment   Specimen Description SPUTUM EXPECTORATED   Final    Special Requests NONE   Final    Sputum evaluation     Final    Value: THIS SPECIMEN IS  ACCEPTABLE. RESPIRATORY CULTURE REPORT TO FOLLOW.     Performed at [facility]   Report Status 05/14/2011 FINAL   Final   CULTURE, RESPIRATORY     Status: Normal (Preliminary result)   Collection Time   05/13/11 11:34 PM      Component Value Range Status Comment   Specimen Description SPUTUM EXPECTORATED   Final    Special Requests NONE   Final    Gram Stain     Final    Value: NO WBC SEEN     FEW SQUAMOUS EPITHELIAL CELLS PRESENT     FEW GRAM POSITIVE RODS     RARE GRAM NEGATIVE RODS   Culture NORMAL OROPHARYNGEAL FLORA   Final    Report Status PENDING   Incomplete    Medical History: Past Medical History  Diagnosis Date  . Hypertension   . Dyspnea   . Chronic kidney disease, stage 2, mildly decreased GFR     Creatinine of 1.31 in 04/2011  . COPD (chronic obstructive pulmonary disease)     moderate by spirometry in 08/2009;FEV1 of 1.1 ;nl alpha -1 antitrypsin   . Obesity   . Psoriasis   .  Left eye trauma     with loss of vision  . Erectile dysfunction   . Lumbosacral spinal stenosis     chronic low back pain  . Headache   . Sleep apnea   . Carotid artery aneurysm     Medications:  Scheduled:     . amLODipine  10 mg Oral Daily  . benazepril  20 mg Oral QHS  . budesonide-formoterol  2 puff Inhalation BID  . enoxaparin  40 mg Subcutaneous Q24H  . folic acid  1 mg Oral Daily  . guaiFENesin  1,200 mg Oral BID  . ipratropium  0.5 mg Nebulization QID  . levalbuterol  0.63 mg Nebulization Q4H  . levofloxacin  750 mg Oral Daily  . nicotine  14 mg Transdermal Q24H  . pantoprazole  40 mg Oral Q1200  . predniSONE  60 mg Oral BID  . vancomycin  1,250 mg Intravenous Q12H  . DISCONTD: albuterol  5 mg Nebulization Q4H  . DISCONTD: amLODipine  5 mg Oral Daily  . DISCONTD: ipratropium  0.5 mg Nebulization Q4H  . DISCONTD: levofloxacin (LEVAQUIN) IV  750 mg Intravenous Q24H  . DISCONTD: methotrexate  10 mg Oral Weekly  . DISCONTD: methylPREDNISolone (SOLU-MEDROL)  injection  125 mg Intravenous Q8H   Assessment: Good renal fxn (ClCr > 80) Vancomycin trough at goal.  Goal of Therapy:  Vancomycin trough level 15-20 mcg/ml  Plan: Continue Vancomycin 1250mg  iv q12hrs.    Caryl Asp 05/15/2011,12:03 PM

## 2011-05-15 NOTE — Progress Notes (Signed)
Patient refused his 2000 treatment tonight he states"he is getting to many of  Them and it is making him worse I explained that he is on xopenex now and it would help him he still refused. Patient is also taking his nasal cannula out and doesent keep on as well.

## 2011-05-15 NOTE — Progress Notes (Signed)
Utilization review completed.  

## 2011-05-15 NOTE — Progress Notes (Signed)
Patient refused bipap states he cannot tolerate it pt has been 95% room air

## 2011-05-15 NOTE — Progress Notes (Signed)
Chart reviewed.  Subjective: Feels a little better. Worried about his dogs at home. Cough with breathing treatments only. Nonproductive. At home, wears oxygen at night or as needed only.  Objective: Vital signs in last 24 hours: Filed Vitals:   05/15/11 0500 05/15/11 0700 05/15/11 0800 05/15/11 0807  BP: 188/107 159/105 159/99   Pulse: 126 121 118   Temp:   98.4 F (36.9 C)   TempSrc:   Oral   Resp: 18 16 16    Height:      Weight:      SpO2: 95% 96% 94% 95%   Weight change: 2.162 kg (4 lb 12.3 oz)  Intake/Output Summary (Last 24 hours) at 05/15/11 0912 Last data filed at 05/15/11 0800  Gross per 24 hour  Intake   2315 ml  Output   2150 ml  Net    165 ml   Telemetry: Sinus tachycardia rate 120  Physical Exam: General: Appears dyspneic with talking. Able to speak in short sentences. Lungs: Diminished throughout. Poor air movement. No wheezes rhonchi or rales. Cardiovascular regular, Rapid, no murmurs gallops rubs Abdomen soft nontender nondistended Extremities trace ankle edema  Lab Results: Basic Metabolic Panel:  Lab 05/14/11 1610 05/13/11 2205 05/13/11 1456  NA 135 -- 138  K 3.9 -- 3.3*  CL 101 -- 100  CO2 25 -- 28  GLUCOSE 140* -- 99  BUN 13 -- 10  CREATININE 1.13 -- 1.32  CALCIUM 9.0 -- 9.2  MG -- 2.2 --  PHOS -- 1.9* --   Liver Function Tests:  Lab 05/14/11 0535  AST 22  ALT 22  ALKPHOS 67  BILITOT 0.3  PROT 6.9  ALBUMIN 3.3*   No results found for this basename: LIPASE:2,AMYLASE:2 in the last 168 hours No results found for this basename: AMMONIA:2 in the last 168 hours CBC:  Lab 05/14/11 0535 05/13/11 1456  WBC 2.9* 3.6*  NEUTROABS -- 1.8  HGB 13.6 14.7  HCT 41.5 44.3  MCV 90.4 90.2  PLT 217 238   Cardiac Enzymes:  Lab 05/14/11 1353 05/14/11 0535 05/13/11 2156  CKTOTAL 225 237* 242*  CKMB 6.0* 5.5* 4.8*  CKMBINDEX -- -- --  TROPONINI <0.30 <0.30 <0.30   BNP:  Lab 05/13/11 2157  PROBNP 919.8*   D-Dimer: No results found for  this basename: DDIMER:2 in the last 168 hours CBG: No results found for this basename: GLUCAP:6 in the last 168 hours Hemoglobin A1C: No results found for this basename: HGBA1C in the last 168 hours Fasting Lipid Panel: No results found for this basename: CHOL,HDL,LDLCALC,TRIG,CHOLHDL,LDLDIRECT in the last 960 hours Thyroid Function Tests:  Lab 05/13/11 2205  TSH 0.225*  T4TOTAL --  FREET4 --  T3FREE --  THYROIDAB --   Coagulation:  Lab 05/14/11 0535 05/13/11 2205  LABPROT 13.3 13.7  INR 0.99 1.03   Anemia Panel: No results found for this basename: VITAMINB12,FOLATE,FERRITIN,TIBC,IRON,RETICCTPCT in the last 168 hours Urine Drug Screen: Drugs of Abuse  No results found for this basename: labopia, cocainscrnur, labbenz, amphetmu, thcu, labbarb    Alcohol Level: No results found for this basename: ETH:2 in the last 168 hours Urinalysis:  Lab 05/13/11 2252  COLORURINE YELLOW  LABSPEC 1.020  PHURINE 6.0  GLUCOSEU NEGATIVE  HGBUR NEGATIVE  BILIRUBINUR NEGATIVE  KETONESUR TRACE*  PROTEINUR NEGATIVE  UROBILINOGEN 0.2  NITRITE NEGATIVE  LEUKOCYTESUR NEGATIVE    Micro Results: Recent Results (from the past 240 hour(s))  MRSA PCR SCREENING     Status: Normal   Collection Time  05/13/11  7:53 PM      Component Value Range Status Comment   MRSA by PCR NEGATIVE  NEGATIVE  Final   URINE CULTURE     Status: Normal   Collection Time   05/13/11 10:52 PM      Component Value Range Status Comment   Specimen Description URINE, CLEAN CATCH   Final    Special Requests NONE   Final    Culture  Setup Time [id number]   Final    Colony Count NO GROWTH   Final    Culture NO GROWTH   Final    Report Status 05/14/2011 FINAL   Final   CULTURE, SPUTUM-ASSESSMENT     Status: Normal   Collection Time   05/13/11 11:34 PM      Component Value Range Status Comment   Specimen Description SPUTUM EXPECTORATED   Final    Special Requests NONE   Final    Sputum evaluation     Final    Value:  THIS SPECIMEN IS ACCEPTABLE. RESPIRATORY CULTURE REPORT TO FOLLOW.     Performed at [facility]   Report Status 05/14/2011 FINAL   Final   CULTURE, RESPIRATORY     Status: Normal (Preliminary result)   Collection Time   05/13/11 11:34 PM      Component Value Range Status Comment   Specimen Description SPUTUM EXPECTORATED   Final    Special Requests NONE   Final    Gram Stain     Final    Value: NO WBC SEEN     FEW SQUAMOUS EPITHELIAL CELLS PRESENT     FEW GRAM POSITIVE RODS     RARE GRAM NEGATIVE RODS   Culture PENDING   Incomplete    Report Status PENDING   Incomplete    Studies/Results: Dg Chest 2 View  05/13/2011  *RADIOLOGY REPORT*  Clinical Data: Shortness of breath, cough  CHEST - 2 VIEW  Comparison: Multiple prior studies, most recently 07/24/2010  Findings: Apparent opacity overlying the right mid lung reflects overlying soft tissues, and is similar to the 2010 study.  Lungs are essentially clear.  No pleural effusion or pneumothorax.  Cardiomediastinal silhouette is within normal limits.  Mild degenerative changes of the visualized thoracolumbar spine.  IMPRESSION: No evidence of acute cardiopulmonary disease.  Original Report Authenticated By: Charline Bills, M.D.   Scheduled Meds:   . sodium chloride   Intravenous STAT  . amLODipine  10 mg Oral Daily  . benazepril  20 mg Oral QHS  . budesonide-formoterol  2 puff Inhalation BID  . enoxaparin  40 mg Subcutaneous Q24H  . folic acid  1 mg Oral Daily  . guaiFENesin  1,200 mg Oral BID  . ipratropium  0.5 mg Nebulization QID  . levalbuterol  0.63 mg Nebulization Q4H  . levofloxacin  750 mg Oral Daily  . nicotine  14 mg Transdermal Q24H  . pantoprazole  40 mg Oral Q1200  . predniSONE  60 mg Oral BID  . vancomycin  1,250 mg Intravenous Q12H  . DISCONTD: albuterol  5 mg Nebulization Q4H  . DISCONTD: amLODipine  5 mg Oral Daily  . DISCONTD: ipratropium  0.5 mg Nebulization Q4H  . DISCONTD: levofloxacin (LEVAQUIN) IV   750 mg Intravenous Q24H  . DISCONTD: methotrexate  10 mg Oral Weekly  . DISCONTD: methylPREDNISolone (SOLU-MEDROL) injection  125 mg Intravenous Q6H  . DISCONTD: methylPREDNISolone (SOLU-MEDROL) injection  125 mg Intravenous Q8H   Continuous Infusions:  PRN Meds:.acetaminophen, HYDROcodone-acetaminophen,  morphine, ondansetron (ZOFRAN) IV, DISCONTD: acetaminophen, DISCONTD: albuterol Assessment/Plan: Principal Problem:  *Acute respiratory failure Active Problems:  COPD (chronic obstructive pulmonary disease) with acute bronchitis  HTN (hypertension), malignant  Sinus tachycardia  Tobacco abuse disorder  Still appears quite dyspneic. Patient is on 5 mg of albuterol every 4 hours. He is quite tachycardic. I will change to Xopenex 0.63 mg every 4 hours. Change Atrovent to 4 times a day. Change Solu-Medrol to prednisone and decrease dose. Blood pressure is quite high as well. Increase Norvasc. Change Levaquin to by mouth. Patient is still too dyspneic to consider discharge. Continue step down for now.   LOS: 2 days   Gredmarie Delange L 05/15/2011, 9:12 AM

## 2011-05-16 LAB — CULTURE, RESPIRATORY W GRAM STAIN: Culture: NORMAL

## 2011-05-16 MED ORDER — PREDNISONE 10 MG PO TABS: ORAL_TABLET | ORAL | Status: DC

## 2011-05-16 MED ORDER — BENAZEPRIL HCL 20 MG PO TABS: 40.0000 mg | ORAL_TABLET | Freq: Every day | ORAL | Status: DC

## 2011-05-16 MED ORDER — LEVOFLOXACIN 750 MG PO TABS: 750.0000 mg | ORAL_TABLET | Freq: Every day | ORAL | Status: AC

## 2011-05-16 NOTE — Discharge Instructions (Signed)
Chronic Obstructive Pulmonary Disease Exacerbation Chronic obstructive pulmonary disease (COPD) is a condition that limits airflow. COPD may include chronic bronchitis, pulmonary emphysema, or both. A COPD exacerbation means that your COPD has gotten worse. Without treatment, this can be a life-threatening problem. COPD exacerbation requires immediate medical care. CAUSES  COPD exacerbation can be caused by:  Exposure to smoke.   Exposure to air pollution, chemical fumes, or dust.   Respiratory infections.   Genetics, particularly alpha 1-antitrypsin deficiency.   A condition in which the body's immune system attacks itself (autoimmunity).  SYMPTOMS   Increased coughing.   Increased wheezing.   Increased shortness of breath.   Swelling due to a buildup of fluid (peripheral edema) related to heart strain.   Rapid breathing.   Chest enlargement (barrel chest).   Chest tightness.  DIAGNOSIS  There is no single test that can diagnosis COPD exacerbation. Your history, physical exam, and other tests will help your caregiver make a diagnosis. Tests may include a chest X-ray, pulmonary function tests, spirometry, basic lab tests, and an arterial blood gas test. TREATMENT  Severe problems may require a stay in the hospital. Depending on the cause of your problems, the following may be prescribed:  Antibiotic medicines.   Bronchodilators (inhaled or tablets).   Cortisone medicines (inhaled or tablets).   Supplemental oxygen therapy.   Pulmonary rehabilitation. This is a broad program that may involve exercise, nutrition counseling, breathing techniques, and further education about your condition.  It is important to use good technique with inhaled medicines. Spacer devices may be needed to help improve drug delivery. HOME CARE INSTRUCTIONS   Do not smoke. Quitting smoking is very important to prevent worsening of COPD.   Avoid exposure to all substances that irritate the airway,  especially tobacco smoke.   If prescribed, take your antibiotics as directed. Finish them even if you start to feel better.   Only take over-the-counter or prescription medicines as directed by your caregiver.   Drink enough fluids to keep your urine clear or pale yellow. This can help thin bronchial secretions.   Use a cool mist vaporizer. This makes it easier to clear your chest when you cough.   If you have a home nebulizer and oxygen, continue to use them as directed.   Maintain all necessary vaccinations to prevent infections.   Exercise regularly.   Eat a healthy diet.   Keep all follow-up appointments as directed by your caregiver.  SEEK IMMEDIATE MEDICAL CARE IF:  You have extreme shortness of breath.   You have severe chest pain or blood in your sputum.   You have a high fever, weakness, repeated vomiting, or fainting.   You feel confused.  MAKE SURE YOU:   Understand these instructions.   Will watch your condition.   Will get help right away if you are not doing well or get worse.  Document Released: 12/23/2006 Document Revised: 02/14/2011 Document Reviewed: 10/23/2010 [employer] Patient Information 2012 [employer]. 

## 2011-05-16 NOTE — Progress Notes (Signed)
Discharge instructions reviewed with patient. Medication reconciliation list discussed, questions answered. Prescriptions for Levaquin and Prednisone given to patient. Patient did not verbalize any questions or concerns. Discharged to home.

## 2011-05-16 NOTE — Progress Notes (Signed)
CARE MANAGEMENT NOTE 05/16/2011  Patient:  [name], [name]   Account Number:  [id number]  Date Initiated:  05/16/2011  Documentation initiated by:  Rosemary Holms  Subjective/Objective Assessment:   Pt admitted with SOB/ COPD. PTA lives with a CNA that works for [employer]. Also has Education officer, museum. Has O2 at home     Action/Plan:   DC to home with the same above arrangements   Anticipated DC Date:  05/16/2011   Anticipated DC Plan:  HOME W HOME HEALTH SERVICES      DC Planning Services  CM consult      Choice offered to / List presented to:          [facility] arranged  HH-1 RN  HH-4 NURSE'S AIDE      HH agency  [facility]  [facility]   Status of service:  Completed, signed off Medicare Important Message given?   (If response is "NO", the following Medicare IM given date fields will be blank) Date Medicare IM given:   Date Additional Medicare IM given:    Discharge Disposition:  HOME W HOME HEALTH SERVICES  Per UR Regulation:    Comments:  05/16/11 1215 [facility] Leanord Hawking RN BSN CM
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
## 2011-05-20 ENCOUNTER — Ambulatory Visit: Payer: Medicare Other | Admitting: Gastroenterology

## 2011-06-04 ENCOUNTER — Telehealth: Payer: Self-pay | Admitting: Orthopedic Surgery

## 2011-06-04 NOTE — Telephone Encounter (Signed)
Return if symptoms worsen or fail to improve. 

## 2011-06-04 NOTE — Telephone Encounter (Signed)
Advised the patient,scheduled an appointment

## 2011-06-04 NOTE — Telephone Encounter (Signed)
Okay to refill vicodin?

## 2011-06-04 NOTE — Telephone Encounter (Signed)
[name] called to ask why his pain medicine was not refilled.  Please call him at [phone]

## 2011-06-26 ENCOUNTER — Encounter: Payer: Self-pay | Admitting: Orthopedic Surgery

## 2011-06-26 ENCOUNTER — Ambulatory Visit (INDEPENDENT_AMBULATORY_CARE_PROVIDER_SITE_OTHER): Payer: Medicare Other | Admitting: Orthopedic Surgery

## 2011-06-26 VITALS — BP 120/88 | Ht 69.0 in | Wt 223.0 lb

## 2011-06-26 DIAGNOSIS — S82009A Unspecified fracture of unspecified patella, initial encounter for closed fracture: Secondary | ICD-10-CM

## 2011-06-26 DIAGNOSIS — M7652 Patellar tendinitis, left knee: Secondary | ICD-10-CM | POA: Insufficient documentation

## 2011-06-26 DIAGNOSIS — M7650 Patellar tendinitis, unspecified knee: Secondary | ICD-10-CM

## 2011-06-26 DIAGNOSIS — M25569 Pain in unspecified knee: Secondary | ICD-10-CM

## 2011-06-26 MED ORDER — HYDROCODONE-ACETAMINOPHEN 5-325 MG PO TABS: 1.0000 | ORAL_TABLET | ORAL | Status: DC | PRN

## 2011-06-26 NOTE — Progress Notes (Signed)
Patient ID: DAISON BRAXTON, male   DOB: 07/18/1955, 56 y.o.   MRN: [id number] Chief Complaint  Patient presents with  . Follow-up    recheck left knee, patient reports still hurting   Status post patella inferior pole fracture with repair about 6 years ago and presented back with pain in the inferior pole of his patella. No weakness and giving out. No catching or locking.  Exam shows he walks independently. He has a knee sleeve. He would like something else done for his knee at this time because of continued pain. Pain at night.  There is tenderness at the inferior pole of the patella. There is heterotopic bone, which is also tender. He is normal. Straight leg raise. No weakness. He has some mild mediolateral joint line pain.  No effusion is noted. His quad strength is normal. The incision healed nicely.  X-ray from January of this year shows a large heterotopic bone formation at the inferior pole of the patella and into the patellar tendon.  Neurovascular exam is intact.  Diagnosis pain LEFT knee With patellar tendinitis and heterotopic bone Status post patella fracture and internal repair  Brace with economy hinged brace Vicodin for pain  Return in 3 months reevaluate. Discussed possible surgical procedure to remove the heterotopic bone. re repair the tendon if necessary

## 2011-06-26 NOTE — Patient Instructions (Signed)
Brace  Pain medications

## 2011-09-25 ENCOUNTER — Ambulatory Visit (INDEPENDENT_AMBULATORY_CARE_PROVIDER_SITE_OTHER): Payer: Medicare Other | Admitting: Orthopedic Surgery

## 2011-09-25 ENCOUNTER — Encounter: Payer: Self-pay | Admitting: Orthopedic Surgery

## 2011-09-25 ENCOUNTER — Ambulatory Visit: Payer: Medicare Other | Admitting: Orthopedic Surgery

## 2011-09-25 VITALS — BP 160/100 | Ht 69.0 in | Wt 227.0 lb

## 2011-09-25 DIAGNOSIS — M2390 Unspecified internal derangement of unspecified knee: Secondary | ICD-10-CM

## 2011-09-25 DIAGNOSIS — M25569 Pain in unspecified knee: Secondary | ICD-10-CM

## 2011-09-25 MED ORDER — HYDROCODONE-ACETAMINOPHEN 5-325 MG PO TABS: 1.0000 | ORAL_TABLET | ORAL | Status: AC | PRN

## 2011-09-25 NOTE — Progress Notes (Signed)
Patient ID: [name], male   DOB: [birth year], 56 y.o.   MRN: [id number] Chief Complaint  Patient presents with  . Follow-up    3 month follow up Left knee    BP 160/100  Ht 5\' 9"  (1.753 m)  Wt 227 lb (102.967 kg)  BMI 33.52 kg/m2  LEFT knee pain, LEFT knee locking.  This patient had a distal pole patella fracture, avulsion, treated with internal fixation and repair did well for several years presented back with tibial tubercle pain now has locking of the LEFT knee. Despite oral anti-inflammatories, Vicodin for pain and a knee brace for over 3 months.  Examination shows no joint effusion. Well-healed anterior incision tenderness over the tibial tubercle. The medial joint line with a positive McMurray sign for medial meniscal tear. Ligaments are stable. Straight leg raises. Intact pulses good temperatures normal. Normal sensation in the limb.  X-rays were done prior.  Locking LEFT knee.  MRI LEFT knee.  Continue brace N. Norco 5 mg for pain, which is controlling his pain

## 2011-09-25 NOTE — Patient Instructions (Addendum)
MRI LEFT KNEE : LOCKING

## 2011-09-25 NOTE — Addendum Note (Signed)
Addended by: Adella Hare B on: 09/25/2011 10:07 AM   Modules accepted: Orders

## 2011-10-01 ENCOUNTER — Ambulatory Visit (HOSPITAL_COMMUNITY): Admission: RE | Admit: 2011-10-01 | Payer: Medicare Other | Source: Ambulatory Visit

## 2011-10-31 ENCOUNTER — Telehealth: Payer: Self-pay | Admitting: Radiology

## 2011-10-31 NOTE — Telephone Encounter (Signed)
I called the patient to give him his MRI appointment at [facility] on 11-05-11 at 9:45. Patient has Medicare, no precert is needed. Patient will follow up in the office for his results.

## 2011-11-05 ENCOUNTER — Ambulatory Visit (HOSPITAL_COMMUNITY)
Admission: RE | Admit: 2011-11-05 | Discharge: 2011-11-05 | Disposition: A | Discharge status: Home / Self Care | Payer: Medicare Other | Source: Ambulatory Visit | Attending: Orthopedic Surgery | Admitting: Orthopedic Surgery

## 2011-11-05 ENCOUNTER — Other Ambulatory Visit: Payer: Self-pay | Admitting: Orthopedic Surgery

## 2011-11-05 DIAGNOSIS — M25569 Pain in unspecified knee: Secondary | ICD-10-CM | POA: Insufficient documentation

## 2011-11-05 DIAGNOSIS — M2390 Unspecified internal derangement of unspecified knee: Secondary | ICD-10-CM

## 2011-11-05 DIAGNOSIS — M7650 Patellar tendinitis, unspecified knee: Secondary | ICD-10-CM | POA: Insufficient documentation

## 2011-11-05 DIAGNOSIS — M2240 Chondromalacia patellae, unspecified knee: Secondary | ICD-10-CM | POA: Insufficient documentation

## 2011-11-12 ENCOUNTER — Encounter: Payer: Self-pay | Admitting: Orthopedic Surgery

## 2011-11-12 ENCOUNTER — Ambulatory Visit: Payer: Medicare Other | Admitting: Orthopedic Surgery

## 2011-11-20 ENCOUNTER — Ambulatory Visit (INDEPENDENT_AMBULATORY_CARE_PROVIDER_SITE_OTHER): Payer: Medicare Other | Admitting: Orthopedic Surgery

## 2011-11-20 ENCOUNTER — Encounter: Payer: Self-pay | Admitting: Orthopedic Surgery

## 2011-11-20 VITALS — BP 180/110 | Ht 69.0 in | Wt 227.0 lb

## 2011-11-20 DIAGNOSIS — S82009A Unspecified fracture of unspecified patella, initial encounter for closed fracture: Secondary | ICD-10-CM

## 2011-11-20 DIAGNOSIS — M7650 Patellar tendinitis, unspecified knee: Secondary | ICD-10-CM

## 2011-11-20 DIAGNOSIS — M7652 Patellar tendinitis, left knee: Secondary | ICD-10-CM

## 2011-11-20 MED ORDER — HYDROCODONE-ACETAMINOPHEN 5-325 MG PO TABS: 1.0000 | ORAL_TABLET | ORAL | Status: AC | PRN

## 2011-11-20 NOTE — Patient Instructions (Addendum)
Take hydrocodone (vicodin)  Wear brace

## 2011-11-20 NOTE — Progress Notes (Signed)
Patient ID: [name], male   DOB: 25-Dec-1955, 56 y.o.   MRN: [id number] Chief Complaint  Patient presents with  . Results    MRI RESULTS LEFT KNEE    Status post LEFT patella fracture, treated with internal suture fixation.  Several years later, developed tendinitis and heterotopic bone, and the tendon.  Currently pain is controlled with Vicodin.  Review of systems is negative.  Exam shows adequate knee flexion, tenderness in the patellar tendon. He remained stable. Strength is normal. Skin is intact. Pulses, normal temperatures normal. Sensation is normal.  RIGHT knee heterotopic bone, patellar tendinitis.  The patient does not have surgery at this time.  His pain is controlled with Norco 5 mg that will continue  Followup with [place] as needed  This is a post imaging followup visit   I have viewed the image and the report, I agree with the report

## 2011-12-06 ENCOUNTER — Encounter (HOSPITAL_COMMUNITY): Payer: Self-pay | Admitting: *Deleted

## 2011-12-06 ENCOUNTER — Inpatient Hospital Stay (HOSPITAL_COMMUNITY)
Admission: EM | Admit: 2011-12-06 | Discharge: 2011-12-08 | DRG: 191 | Disposition: A | Discharge status: Home Health | Payer: Medicare Other | Attending: Internal Medicine | Admitting: Internal Medicine

## 2011-12-06 ENCOUNTER — Emergency Department (HOSPITAL_COMMUNITY): Payer: Medicare Other

## 2011-12-06 DIAGNOSIS — G8929 Other chronic pain: Secondary | ICD-10-CM | POA: Diagnosis present

## 2011-12-06 DIAGNOSIS — Z79899 Other long term (current) drug therapy: Secondary | ICD-10-CM

## 2011-12-06 DIAGNOSIS — J209 Acute bronchitis, unspecified: Principal | ICD-10-CM | POA: Diagnosis present

## 2011-12-06 DIAGNOSIS — T380X5A Adverse effect of glucocorticoids and synthetic analogues, initial encounter: Secondary | ICD-10-CM | POA: Diagnosis not present

## 2011-12-06 DIAGNOSIS — Z6835 Body mass index (BMI) 35.0-35.9, adult: Secondary | ICD-10-CM

## 2011-12-06 DIAGNOSIS — R000 Tachycardia, unspecified: Secondary | ICD-10-CM

## 2011-12-06 DIAGNOSIS — L408 Other psoriasis: Secondary | ICD-10-CM | POA: Diagnosis present

## 2011-12-06 DIAGNOSIS — M545 Low back pain: Secondary | ICD-10-CM

## 2011-12-06 DIAGNOSIS — R739 Hyperglycemia, unspecified: Secondary | ICD-10-CM | POA: Diagnosis not present

## 2011-12-06 DIAGNOSIS — Z9109 Other allergy status, other than to drugs and biological substances: Secondary | ICD-10-CM | POA: Diagnosis present

## 2011-12-06 DIAGNOSIS — M23302 Other meniscus derangements, unspecified lateral meniscus, unspecified knee: Secondary | ICD-10-CM

## 2011-12-06 DIAGNOSIS — T50905A Adverse effect of unspecified drugs, medicaments and biological substances, initial encounter: Secondary | ICD-10-CM | POA: Diagnosis not present

## 2011-12-06 DIAGNOSIS — Z720 Tobacco use: Secondary | ICD-10-CM | POA: Diagnosis present

## 2011-12-06 DIAGNOSIS — M2390 Unspecified internal derangement of unspecified knee: Secondary | ICD-10-CM

## 2011-12-06 DIAGNOSIS — N529 Male erectile dysfunction, unspecified: Secondary | ICD-10-CM

## 2011-12-06 DIAGNOSIS — N182 Chronic kidney disease, stage 2 (mild): Secondary | ICD-10-CM | POA: Diagnosis present

## 2011-12-06 DIAGNOSIS — G4730 Sleep apnea, unspecified: Secondary | ICD-10-CM | POA: Diagnosis present

## 2011-12-06 DIAGNOSIS — I10 Essential (primary) hypertension: Secondary | ICD-10-CM | POA: Diagnosis present

## 2011-12-06 DIAGNOSIS — S82009A Unspecified fracture of unspecified patella, initial encounter for closed fracture: Secondary | ICD-10-CM

## 2011-12-06 DIAGNOSIS — IMO0002 Reserved for concepts with insufficient information to code with codable children: Secondary | ICD-10-CM

## 2011-12-06 DIAGNOSIS — J9600 Acute respiratory failure, unspecified whether with hypoxia or hypercapnia: Secondary | ICD-10-CM

## 2011-12-06 DIAGNOSIS — J440 Chronic obstructive pulmonary disease with acute lower respiratory infection: Principal | ICD-10-CM | POA: Diagnosis present

## 2011-12-06 DIAGNOSIS — M25569 Pain in unspecified knee: Secondary | ICD-10-CM

## 2011-12-06 DIAGNOSIS — M48061 Spinal stenosis, lumbar region without neurogenic claudication: Secondary | ICD-10-CM | POA: Diagnosis present

## 2011-12-06 DIAGNOSIS — E669 Obesity, unspecified: Secondary | ICD-10-CM | POA: Diagnosis present

## 2011-12-06 DIAGNOSIS — I129 Hypertensive chronic kidney disease with stage 1 through stage 4 chronic kidney disease, or unspecified chronic kidney disease: Secondary | ICD-10-CM | POA: Diagnosis present

## 2011-12-06 DIAGNOSIS — I720 Aneurysm of carotid artery: Secondary | ICD-10-CM

## 2011-12-06 DIAGNOSIS — N289 Disorder of kidney and ureter, unspecified: Secondary | ICD-10-CM | POA: Diagnosis not present

## 2011-12-06 DIAGNOSIS — Z9981 Dependence on supplemental oxygen: Secondary | ICD-10-CM

## 2011-12-06 DIAGNOSIS — R7309 Other abnormal glucose: Secondary | ICD-10-CM | POA: Diagnosis not present

## 2011-12-06 DIAGNOSIS — M7652 Patellar tendinitis, left knee: Secondary | ICD-10-CM

## 2011-12-06 DIAGNOSIS — Z87891 Personal history of nicotine dependence: Secondary | ICD-10-CM

## 2011-12-06 DIAGNOSIS — F17200 Nicotine dependence, unspecified, uncomplicated: Secondary | ICD-10-CM

## 2011-12-06 DIAGNOSIS — J441 Chronic obstructive pulmonary disease with (acute) exacerbation: Secondary | ICD-10-CM | POA: Diagnosis present

## 2011-12-06 DIAGNOSIS — Z0189 Encounter for other specified special examinations: Secondary | ICD-10-CM

## 2011-12-06 DIAGNOSIS — J449 Chronic obstructive pulmonary disease, unspecified: Secondary | ICD-10-CM

## 2011-12-06 LAB — CBC
Hemoglobin: 15.8 g/dL (ref 13.0–17.0)
Platelets: 256 10*3/uL (ref 150–400)
RBC: 5.12 MIL/uL (ref 4.22–5.81)
WBC: 5.9 10*3/uL (ref 4.0–10.5)

## 2011-12-06 LAB — BASIC METABOLIC PANEL
CO2: 26 mEq/L (ref 19–32)
Calcium: 9.7 mg/dL (ref 8.4–10.5)
GFR calc non Af Amer: 58 mL/min — ABNORMAL LOW (ref >90)
Potassium: 3.6 mEq/L (ref 3.5–5.1)
Sodium: 138 mEq/L (ref 135–145)

## 2011-12-06 LAB — POCT I-STAT TROPONIN I: Troponin i, poc: 0.03 ng/mL (ref 0.00–0.08)

## 2011-12-06 MED ORDER — IPRATROPIUM BROMIDE 0.02 % IN SOLN
0.5000 mg | Freq: Once | RESPIRATORY_TRACT | Status: AC
  Administered 2011-12-06: 0.5 mg via RESPIRATORY_TRACT
  Filled 2011-12-06 (×2): qty 2.5

## 2011-12-06 MED ORDER — ALBUTEROL SULFATE (5 MG/ML) 0.5% IN NEBU
5.0000 mg | INHALATION_SOLUTION | Freq: Once | RESPIRATORY_TRACT | Status: AC
  Administered 2011-12-06: 5 mg via RESPIRATORY_TRACT
  Filled 2011-12-06 (×3): qty 0.5

## 2011-12-06 MED ORDER — ALBUTEROL SULFATE (5 MG/ML) 0.5% IN NEBU
5.0000 mg | INHALATION_SOLUTION | Freq: Once | RESPIRATORY_TRACT | Status: AC
  Administered 2011-12-06: 5 mg via RESPIRATORY_TRACT

## 2011-12-06 NOTE — ED Notes (Signed)
Admitting MD here to see pt.  Will give breathing treatment once he is done seeing pt

## 2011-12-06 NOTE — H&P (Signed)
[name] is an 56 y.o. male.    PCP: Ninfa Linden, NP  His pulmonologist is Dr. Juanetta Gosling  Chief Complaint: Shortness of breath for 2 days  HPI: This is a 56 year old, African American male, with a past medical history of COPD, hypertension, psoriasis, who was in his usual state of health about 2 days ago, when he started developing shortness of breath with wheezing. He had cough with greenish expectoration. Denies any blood in the expectorant. He been feeling congested in the chest, but denied any chest pain. Denies any fever or chills. No nausea, vomiting. Denies any sick contacts. Denies any leg swelling. He tried taking inhaler at home, but only had partial relief. He also uses oxygen at home chronically. He was put on a prolonged steroid taper over a month by his pulmonologist and he took the last dose yesterday. After receiving breathing treatments in the ED, he is feeling a little better. He is also concerned about exposure to mold in his house.   Home Medications: Prior to Admission medications   Medication Sig Start Date End Date Taking? Authorizing Provider  albuterol (VENTOLIN HFA) 108 (90 BASE) MCG/ACT inhaler Inhale 2 puffs into the lungs every 4 (four) hours as needed. For shortness of breath    Historical Provider, MD  benazepril (LOTENSIN) 20 MG tablet Take 2 tablets (40 mg total) by mouth at bedtime. 05/16/11   Christiane Ha, MD  Fluticasone-Salmeterol (ADVAIR DISKUS) 250-50 MCG/DOSE AEPB Inhale 1 puff into the lungs every 12 (twelve) hours.      Historical Provider, MD  folic acid (FOLVITE) 1 MG tablet Take 1 mg by mouth daily.    Historical Provider, MD  ipratropium-albuterol (DUONEB) 0.5-2.5 (3) MG/3ML SOLN Take 3 mLs by nebulization 2 (two) times daily as needed. For shortness of breath    Historical Provider, MD  methotrexate (RHEUMATREX) 2.5 MG tablet Take 10 mg by mouth once a week. On saturdays Caution:Chemotherapy. Protect from light.    Historical  Provider, MD    Allergies:  Allergies  Allergen Reactions  . Bee Venom Anaphylaxis    Patient has an epi pen    Past Medical History: Past Medical History  Diagnosis Date  . Hypertension   . Dyspnea   . Chronic kidney disease, stage 2, mildly decreased GFR     Creatinine of 1.31 in 04/2011  . COPD (chronic obstructive pulmonary disease)     moderate by spirometry in 08/2009;FEV1 of 1.1 ;nl alpha -1 antitrypsin   . Obesity   . Psoriasis   . Left eye trauma     with loss of vision  . Erectile dysfunction   . Lumbosacral spinal stenosis     chronic low back pain  . Headache   . Sleep apnea   . Carotid artery aneurysm     Past Surgical History  Procedure Date  . Orif patella 12/14/2005    left fracture Dr.Harrison   . Ophthalmologic surgery 1963    secondary to left eye trauma    Social History:  reports that he quit smoking about 17 months ago. He has never used smokeless tobacco. He reports that he drinks about .5 ounces of alcohol per week. He reports that he does not use illicit drugs.  Family History:  Family History  Problem Relation Age of Onset  . Heart disease    . Arthritis    . Lung disease    . Cancer    . Asthma  Review of Systems - History obtained from the patient General ROS: positive for  - fatigue Psychological ROS: negative Ophthalmic ROS: He is blind in his left eye ENT ROS: negative Allergy and Immunology ROS: negative Hematological and Lymphatic ROS: negative Endocrine ROS: negative Respiratory ROS: as in hpi Cardiovascular ROS: no chest pain or dyspnea on exertion Gastrointestinal ROS: no abdominal pain, change in bowel habits, or black or bloody stools Genito-Urinary ROS: no dysuria, trouble voiding, or hematuria Musculoskeletal ROS: negative Neurological ROS: no TIA or stroke symptoms Dermatological ROS: negative  Physical Examination Blood pressure 166/108, pulse 114, temperature 98.7 F (37.1 C), temperature source Oral,  resp. rate 24, height 5\' 9"  (1.753 m), weight 108.863 kg (240 lb), SpO2 94.00%.  General appearance: alert, cooperative, appears stated age and no distress Head: Normocephalic, without obvious abnormality, atraumatic Eyes: conjunctivae/corneas clear. PERRL, EOM's intact.  Throat: lips, mucosa, and tongue normal; teeth and gums normal Neck: no adenopathy, no carotid bruit, no JVD, supple, symmetrical, trachea midline and thyroid not enlarged, symmetric, no tenderness/mass/nodules Back: symmetric, no curvature. ROM normal. No CVA tenderness. Resp: end expiratory wheezing bilaterally.rhonchi is present. No definite crackles. Cardio: regular rate and rhythm, S1, S2 normal, no murmur, click, rub or gallop GI: soft, non-tender; bowel sounds normal; no masses,  no organomegaly Extremities: extremities normal, atraumatic, no cyanosis or edema Pulses: 2+ and symmetric Skin: Skin color, texture, turgor normal. No rashes or lesions Lymph nodes: Cervical, supraclavicular, and axillary nodes normal. Neurologic: He is alert and oriented x3. Cranial nerves are intact. No focal neurological deficits are present.  Laboratory Data: Results for orders placed during the hospital encounter of 12/06/11 (from the past 48 hour(s))  BASIC METABOLIC PANEL     Status: Abnormal   Collection Time   12/06/11  9:50 PM      Component Value Range Comment   Sodium 138  135 - 145 mEq/L    Potassium 3.6  3.5 - 5.1 mEq/L    Chloride 100  96 - 112 mEq/L    CO2 26  19 - 32 mEq/L    Glucose, Bld 106 (*) 70 - 99 mg/dL    BUN 13  6 - 23 mg/dL    Creatinine, Ser 8.29  0.50 - 1.35 mg/dL    Calcium 9.7  8.4 - 56.2 mg/dL    GFR calc non Af Amer 58 (*) >90 mL/min    GFR calc Af Amer 68 (*) >90 mL/min   CBC     Status: Normal   Collection Time   12/06/11  9:50 PM      Component Value Range Comment   WBC 5.9  4.0 - 10.5 K/uL    RBC 5.12  4.22 - 5.81 MIL/uL    Hemoglobin 15.8  13.0 - 17.0 g/dL    HCT 13.0  86.5 - 78.4 %    MCV  90.4  78.0 - 100.0 fL    MCH 30.9  26.0 - 34.0 pg    MCHC 34.1  30.0 - 36.0 g/dL    RDW 69.6  29.5 - 28.4 %    Platelets 256  150 - 400 K/uL   POCT I-STAT TROPONIN I     Status: Normal   Collection Time   12/06/11 10:25 PM      Component Value Range Comment   Troponin i, poc 0.03  0.00 - 0.08 ng/mL    Comment 3              Radiology Reports: Dg  Chest Port 1 View  12/06/2011  *RADIOLOGY REPORT*  Clinical Data: Shortness of breath.  PORTABLE CHEST - 1 VIEW  Comparison: 05/13/2011  Findings: There is marked lucency in the upper lungs suggesting emphysematous changes.  Prominent lung markings in the lower chest are unchanged.  No evidence for airspace disease.  Mild fullness in the right paratracheal region is unchanged.  The heart size is stable.  IMPRESSION: Emphysematous changes without acute chest findings.   Original Report Authenticated By: Richarda Overlie, M.D.     Electrocardiogram: Sinus tachycardia at 107 beats per minute. Normal axis. Intervals are normal. No significant Q waves. No concerning ST or T-wave changes. Some old changes noted in leads V5, V6.  Assessment/Plan  Active Problems:  COPD (chronic obstructive pulmonary disease) with acute bronchitis  Tobacco abuse disorder  Accelerated hypertension   #1 COPD with acute exacerbation: He appears patient has severe COPD. He could be steroid dependent. He will need to be followed up by his pulmonologist when he is discharged. He'll be treated with intravenous steroids, antibiotics, nebulizer treatments. He's able to speak full sentences. He appears to be stable to go to a telemetry floor. Oxygen will be provided.  #2 accelerated hypertension: Hydralazine intravenously will be provided as needed. Blood pressures will beitored closely. His home antihypertension regimen will be reinitiated.  #3 tobacco abuse: He's been counseled to quit smoking. Nicotine patch will be utilized.  DVT, prophylaxis with enoxaparin.  He is a full  code.  Further management decisions will depend on results of further testing and patient's response to treatment.  [facility]  [facility] Pager [phone]  12/06/2011, 11:08 PM

## 2011-12-06 NOTE — ED Notes (Signed)
Pt is here for sob which has been increasing for 2 days and is unrelieved by albuterol.  Pt had 125mg  solumedrol en route by ems and is speaking in short sentences on arrival.

## 2011-12-06 NOTE — ED Notes (Signed)
Report given to RN on unit. Pt being admitted to room 327.

## 2011-12-06 NOTE — ED Provider Notes (Signed)
History   This chart was scribed for [name] Lennert, MD by Toya Smothers. The patient was seen in room APA04/APA04. Patient's care was started at 2126.  CSN: [id number]  Arrival date & time 12/06/11  2126   First MD Initiated Contact with Patient 12/06/11 2203      Chief Complaint  Patient presents with  . Shortness of Breath   Patient is a 56 y.o. male presenting with shortness of breath. The history is provided by the patient. No language interpreter was used.  Shortness of Breath  The current episode started yesterday. The onset was sudden. The problem occurs continuously. The problem has been gradually worsening. The problem is moderate. Nothing relieves the symptoms. The symptoms are aggravated by smoke exposure. Associated symptoms include cough, shortness of breath and wheezing. Pertinent negatives include no chest pain and no rhinorrhea. It is unknown what precipitates the cough. The cough is productive. There was no intake of a foreign body. He has had intermittent steroid use. He has had prior hospitalizations. He has had prior ICU admissions. Past medical history comments: COPD, 84yr h/o everday smoking. He has been less active. There were no sick contacts.    [name] is a 56 y.o. male with a h/o COPD who presents to the Emergency Department complaining of 2 day of sudden onset moderate constant SOB. Pt endorses associate mild productive cough and wheezing. SOB is aggravated with exertion and alleviated by nothing. He denotes that he has not been taking his breathing treatments regularly in the past 2 weeks. Prior to arrival symptoms were treated with Advair breathing treatment (x2 today) and an Albulterol with no relief. Pt also lists the daily use of [place] oxygen at home. Pt denies fever, chills, emesis, nausea, rash, and cough.   Past Medical History  Diagnosis Date  . Hypertension   . Dyspnea   . Chronic kidney disease, stage 2, mildly decreased GFR     Creatinine of  1.31 in 04/2011  . COPD (chronic obstructive pulmonary disease)     moderate by spirometry in 08/2009;FEV1 of 1.1 ;nl alpha -1 antitrypsin   . Obesity   . Psoriasis   . Left eye trauma     with loss of vision  . Erectile dysfunction   . Lumbosacral spinal stenosis     chronic low back pain  . Headache   . Sleep apnea   . Carotid artery aneurysm    Past Surgical History  Procedure Date  . Orif patella 12/14/2005    left fracture Dr.Harrison   . Ophthalmologic surgery 1963    secondary to left eye trauma   Family History  Problem Relation Age of Onset  . Heart disease    . Arthritis    . Lung disease    . Cancer    . Asthma     History  Substance Use Topics  . Smoking status: Former Smoker -- 1.0 packs/day for 20 years    Quit date: 06/10/2010  . Smokeless tobacco: Never Used   Comment: Quit in 06/2010; may have subsequently resumed  . Alcohol Use: 0.5 oz/week    1 drink(s) per week    Review of Systems  Constitutional: Negative for fatigue.  HENT: Negative for congestion, rhinorrhea, sinus pressure and ear discharge.   Eyes: Negative for discharge.  Respiratory: Positive for cough, shortness of breath and wheezing.   Cardiovascular: Negative for chest pain.  Gastrointestinal: Negative for abdominal pain and diarrhea.  Genitourinary: Negative for frequency  and hematuria.  Musculoskeletal: Negative for back pain.  Skin: Negative for rash.  Neurological: Negative for seizures and headaches.  Hematological: Negative.   Psychiatric/Behavioral: Negative for hallucinations.    Allergies  Bee venom  Home Medications   Current Outpatient Rx  Name Route Sig Dispense Refill  . ALBUTEROL SULFATE HFA 108 (90 BASE) MCG/ACT IN AERS Inhalation Inhale 2 puffs into the lungs every 4 (four) hours as needed. For shortness of breath    . BENAZEPRIL HCL 20 MG PO TABS Oral Take 2 tablets (40 mg total) by mouth at bedtime.    [name] FLUTICASONE-SALMETEROL 250-50 MCG/DOSE IN AEPB  Inhalation Inhale 1 puff into the lungs every 12 (twelve) hours.      [name] FOLIC ACID 1 MG PO TABS Oral Take 1 mg by mouth daily.    . IPRATROPIUM-ALBUTEROL 0.5-2.5 (3) MG/3ML IN SOLN Nebulization Take 3 mLs by nebulization 2 (two) times daily as needed. For shortness of breath    . METHOTREXATE 2.5 MG PO TABS Oral Take 10 mg by mouth once a week. On saturdays Caution:Chemotherapy. Protect from light.     BP 166/108  Pulse 114  Temp 98.7 F (37.1 C) (Oral)  Resp 24  Ht 5\' 9"  (1.753 m)  Wt 240 lb (108.863 kg)  BMI 35.44 kg/m2  SpO2 96%  Physical Exam  Constitutional: He is oriented to person, place, and time. He appears well-developed.  HENT:  Head: Normocephalic and atraumatic.  Eyes: Conjunctivae normal and EOM are normal. No scleral icterus.  Neck: Neck supple. No thyromegaly present.  Cardiovascular: Normal rate and regular rhythm.  Exam reveals no gallop and no friction rub.   No murmur heard. Pulmonary/Chest: No stridor. He has no wheezes. He has no rales. He exhibits no tenderness.       Mild respiratory wheezing bilaterally.  Abdominal: He exhibits no distension. There is no tenderness. There is no rebound.  Musculoskeletal: Normal range of motion. He exhibits no edema.  Lymphadenopathy:    He has no cervical adenopathy.  Neurological: He is oriented to person, place, and time. Coordination normal.  Skin: No rash noted. No erythema.  Psychiatric: He has a normal mood and affect. His behavior is normal.    ED Course  Procedures (including critical care time) DIAGNOSTIC STUDIES: Oxygen Saturation is 97% on [place], normal by my interpretation.    COORDINATION OF CARE: 22:01- Ordered DG Chest Port 1 View 1 time imaging. 22:03- Ordered Basic metabolic panel Once. 22:05- Evaluated Pt. Pt is awake, alert, and oriented. 22:15- Ordered albuterol (PROVENTIL) (5 MG/ML) 0.5% nebulizer solution 5 mg Once. 22:26- Rechecked Pt. Pt is feeling better after treatment.   Labs Reviewed    BASIC METABOLIC PANEL  CBC   Dg Chest Port 1 View  12/06/2011  *RADIOLOGY REPORT*  Clinical Data: Shortness of breath.  PORTABLE CHEST - 1 VIEW  Comparison: 05/13/2011  Findings: There is marked lucency in the upper lungs suggesting emphysematous changes.  Prominent lung markings in the lower chest are unchanged.  No evidence for airspace disease.  Mild fullness in the right paratracheal region is unchanged.  The heart size is stable.  IMPRESSION: Emphysematous changes without acute chest findings.   Original Report Authenticated By: Richarda Overlie, M.D.    No diagnosis found.  Date: 12/06/2011  Rate:107  Rhythm: sinus tachycardia  QRS Axis: normal  Intervals: normal  ST/T Wave abnormalities: nonspecific ST changes  Conduction Disutrbances:none  Narrative Interpretation:   Old EKG Reviewed: none available  MDM     The chart was scribed for me under my direct supervision.  I personally performed the history, physical, and medical decision making and all procedures in the evaluation of this patient.[name] Lennert, MD 12/06/11 2259

## 2011-12-07 DIAGNOSIS — R739 Hyperglycemia, unspecified: Secondary | ICD-10-CM | POA: Diagnosis not present

## 2011-12-07 DIAGNOSIS — T50904A Poisoning by unspecified drugs, medicaments and biological substances, undetermined, initial encounter: Secondary | ICD-10-CM

## 2011-12-07 DIAGNOSIS — R7309 Other abnormal glucose: Secondary | ICD-10-CM

## 2011-12-07 DIAGNOSIS — I10 Essential (primary) hypertension: Secondary | ICD-10-CM

## 2011-12-07 DIAGNOSIS — T50905A Adverse effect of unspecified drugs, medicaments and biological substances, initial encounter: Secondary | ICD-10-CM | POA: Diagnosis not present

## 2011-12-07 DIAGNOSIS — N289 Disorder of kidney and ureter, unspecified: Secondary | ICD-10-CM | POA: Diagnosis not present

## 2011-12-07 DIAGNOSIS — J440 Chronic obstructive pulmonary disease with acute lower respiratory infection: Principal | ICD-10-CM

## 2011-12-07 LAB — GLUCOSE, CAPILLARY: Glucose-Capillary: 148 mg/dL — ABNORMAL HIGH (ref 70–99)

## 2011-12-07 LAB — COMPREHENSIVE METABOLIC PANEL
ALT: 18 U/L (ref 0–53)
BUN: 19 mg/dL (ref 6–23)
CO2: 25 mEq/L (ref 19–32)
Calcium: 9.5 mg/dL (ref 8.4–10.5)
GFR calc Af Amer: 52 mL/min — ABNORMAL LOW (ref >90)
GFR calc non Af Amer: 45 mL/min — ABNORMAL LOW (ref >90)
Glucose, Bld: 153 mg/dL — ABNORMAL HIGH (ref 70–99)
Total Protein: 7.1 g/dL (ref 6.0–8.3)

## 2011-12-07 LAB — CBC
HCT: 43 % (ref 39.0–52.0)
Hemoglobin: 14.5 g/dL (ref 13.0–17.0)
MCHC: 33.7 g/dL (ref 30.0–36.0)
RDW: 12.3 % (ref 11.5–15.5)
WBC: 3.8 10*3/uL — ABNORMAL LOW (ref 4.0–10.5)

## 2011-12-07 MED ORDER — FLUTICASONE-SALMETEROL 250-50 MCG/DOSE IN AEPB
1.0000 | INHALATION_SPRAY | Freq: Two times a day (BID) | RESPIRATORY_TRACT | Status: DC
  Administered 2011-12-07 – 2011-12-08 (×3): 1 via RESPIRATORY_TRACT
  Filled 2011-12-07: qty 14

## 2011-12-07 MED ORDER — SODIUM CHLORIDE 0.45 % IV SOLN
INTRAVENOUS | Status: DC
  Administered 2011-12-07 (×2): via INTRAVENOUS

## 2011-12-07 MED ORDER — HYDRALAZINE HCL 20 MG/ML IJ SOLN: 10.0000 mg | Freq: Four times a day (QID) | INTRAMUSCULAR | Status: DC | PRN

## 2011-12-07 MED ORDER — LEVOFLOXACIN IN D5W 500 MG/100ML IV SOLN
500.0000 mg | INTRAVENOUS | Status: DC
  Administered 2011-12-07 (×2): 500 mg via INTRAVENOUS
  Filled 2011-12-07 (×3): qty 100

## 2011-12-07 MED ORDER — ONDANSETRON HCL 4 MG PO TABS: 4.0000 mg | ORAL_TABLET | Freq: Four times a day (QID) | ORAL | Status: DC | PRN

## 2011-12-07 MED ORDER — METHYLPREDNISOLONE SODIUM SUCC 125 MG IJ SOLR
60.0000 mg | Freq: Four times a day (QID) | INTRAMUSCULAR | Status: DC
  Administered 2011-12-07 – 2011-12-08 (×6): 60 mg via INTRAVENOUS
  Filled 2011-12-07 (×6): qty 2

## 2011-12-07 MED ORDER — NICOTINE 14 MG/24HR TD PT24
14.0000 mg | MEDICATED_PATCH | Freq: Every day | TRANSDERMAL | Status: DC
  Administered 2011-12-07 – 2011-12-08 (×3): 14 mg via TRANSDERMAL
  Filled 2011-12-07 (×3): qty 1

## 2011-12-07 MED ORDER — LEVOFLOXACIN IN D5W 500 MG/100ML IV SOLN
INTRAVENOUS | Status: AC
  Filled 2011-12-07: qty 100

## 2011-12-07 MED ORDER — SODIUM CHLORIDE 0.9 % IV SOLN: 250.0000 mL | INTRAVENOUS | Status: DC | PRN

## 2011-12-07 MED ORDER — BENZONATATE 100 MG PO CAPS
100.0000 mg | ORAL_CAPSULE | Freq: Three times a day (TID) | ORAL | Status: DC
  Administered 2011-12-07 – 2011-12-08 (×4): 100 mg via ORAL
  Filled 2011-12-07 (×4): qty 1

## 2011-12-07 MED ORDER — IPRATROPIUM BROMIDE 0.02 % IN SOLN
0.5000 mg | RESPIRATORY_TRACT | Status: DC
  Administered 2011-12-07 – 2011-12-08 (×8): 0.5 mg via RESPIRATORY_TRACT
  Filled 2011-12-07 (×8): qty 2.5

## 2011-12-07 MED ORDER — INSULIN ASPART 100 UNIT/ML ~~LOC~~ SOLN: 0.0000 [IU] | Freq: Every day | SUBCUTANEOUS | Status: DC

## 2011-12-07 MED ORDER — LORATADINE 10 MG PO TABS
10.0000 mg | ORAL_TABLET | Freq: Every day | ORAL | Status: DC
  Administered 2011-12-07: 10 mg via ORAL
  Filled 2011-12-07: qty 1

## 2011-12-07 MED ORDER — INSULIN ASPART 100 UNIT/ML ~~LOC~~ SOLN
0.0000 [IU] | Freq: Three times a day (TID) | SUBCUTANEOUS | Status: DC
  Administered 2011-12-07 – 2011-12-08 (×3): 3 [IU] via SUBCUTANEOUS

## 2011-12-07 MED ORDER — SODIUM CHLORIDE 0.9 % IJ SOLN
3.0000 mL | Freq: Two times a day (BID) | INTRAMUSCULAR | Status: DC
  Administered 2011-12-07 (×2): 3 mL via INTRAVENOUS
  Filled 2011-12-07 (×3): qty 3

## 2011-12-07 MED ORDER — FOLIC ACID 1 MG PO TABS
1.0000 mg | ORAL_TABLET | Freq: Every day | ORAL | Status: DC
Start: 2011-12-07 — End: 2011-12-08
  Administered 2011-12-07 – 2011-12-08 (×2): 1 mg via ORAL
  Filled 2011-12-07 (×2): qty 1

## 2011-12-07 MED ORDER — GUAIFENESIN ER 600 MG PO TB12
600.0000 mg | ORAL_TABLET | Freq: Two times a day (BID) | ORAL | Status: DC
  Administered 2011-12-07 – 2011-12-08 (×4): 600 mg via ORAL
  Filled 2011-12-07 (×4): qty 1

## 2011-12-07 MED ORDER — METHOTREXATE 2.5 MG PO TABS
10.0000 mg | ORAL_TABLET | ORAL | Status: DC
  Filled 2011-12-07: qty 4

## 2011-12-07 MED ORDER — ALBUTEROL SULFATE (5 MG/ML) 0.5% IN NEBU: 2.5000 mg | INHALATION_SOLUTION | RESPIRATORY_TRACT | Status: DC | PRN

## 2011-12-07 MED ORDER — ALBUTEROL SULFATE (5 MG/ML) 0.5% IN NEBU
2.5000 mg | INHALATION_SOLUTION | RESPIRATORY_TRACT | Status: DC
  Administered 2011-12-07 – 2011-12-08 (×8): 2.5 mg via RESPIRATORY_TRACT
  Filled 2011-12-07 (×8): qty 0.5

## 2011-12-07 MED ORDER — ENOXAPARIN SODIUM 40 MG/0.4ML ~~LOC~~ SOLN
40.0000 mg | SUBCUTANEOUS | Status: DC
  Administered 2011-12-07 – 2011-12-08 (×2): 40 mg via SUBCUTANEOUS
  Filled 2011-12-07 (×2): qty 0.4

## 2011-12-07 MED ORDER — SODIUM CHLORIDE 0.9 % IJ SOLN
3.0000 mL | Freq: Two times a day (BID) | INTRAMUSCULAR | Status: DC
  Administered 2011-12-07: 3 mL via INTRAVENOUS

## 2011-12-07 MED ORDER — ACETAMINOPHEN 325 MG PO TABS: 650.0000 mg | ORAL_TABLET | Freq: Four times a day (QID) | ORAL | Status: DC | PRN

## 2011-12-07 MED ORDER — ONDANSETRON HCL 4 MG/2ML IJ SOLN: 4.0000 mg | Freq: Four times a day (QID) | INTRAMUSCULAR | Status: DC | PRN

## 2011-12-07 MED ORDER — ACETAMINOPHEN 650 MG RE SUPP: 650.0000 mg | Freq: Four times a day (QID) | RECTAL | Status: DC | PRN

## 2011-12-07 MED ORDER — INSULIN GLARGINE 100 UNIT/ML ~~LOC~~ SOLN
15.0000 [IU] | Freq: Every day | SUBCUTANEOUS | Status: DC
  Administered 2011-12-07: 15 [IU] via SUBCUTANEOUS

## 2011-12-07 MED ORDER — SODIUM CHLORIDE 0.9 % IJ SOLN
3.0000 mL | INTRAMUSCULAR | Status: DC | PRN
  Filled 2011-12-07: qty 3

## 2011-12-07 MED ORDER — BENAZEPRIL HCL 10 MG PO TABS
40.0000 mg | ORAL_TABLET | Freq: Every day | ORAL | Status: DC
  Administered 2011-12-07 (×2): 40 mg via ORAL
  Filled 2011-12-07 (×2): qty 4

## 2011-12-07 NOTE — ED Provider Notes (Addendum)
Subjective: The patient says that his chest feels less tight. He has a cough which is intermittently productive and nonproductive. He has had green sputum. He wonders if newly found mold his house cause his flare up.  Objective: Vital signs in last 24 hours: Filed Vitals:   12/07/11 0013 12/07/11 0329 12/07/11 0551 12/07/11 0704  BP:   148/95   Pulse:   95   Temp:   97.8 F (36.6 C)   TempSrc:   Oral   Resp:   22   Height:      Weight:      SpO2: 96% 96% 96% 94%    Intake/Output Summary (Last 24 hours) at 12/07/11 1023 Last data filed at 12/07/11 0800  Gross per 24 hour  Intake    240 ml  Output      0 ml  Net    240 ml    Weight change:    Lab Results: Basic Metabolic Panel:  Basename 12/07/11 0553 12/06/11 2150  NA 138 138  K 3.5 3.6  CL 100 100  CO2 25 26  GLUCOSE 153* 106*  BUN 19 13  CREATININE 1.64* 1.32  CALCIUM 9.5 9.7  MG -- --  PHOS -- --   Liver Function Tests:  Basename 12/07/11 0553  AST 18  ALT 18  ALKPHOS 70  BILITOT 0.4  PROT 7.1  ALBUMIN 3.6   No results found for this basename: LIPASE:2,AMYLASE:2 in the last 72 hours No results found for this basename: AMMONIA:2 in the last 72 hours CBC:  Basename 12/07/11 0553 12/06/11 2150  WBC 3.8* 5.9  NEUTROABS -- --  HGB 14.5 15.8  HCT 43.0 46.3  MCV 90.5 90.4  PLT 237 256   Cardiac Enzymes: No results found for this basename: CKTOTAL:3,CKMB:3,CKMBINDEX:3,TROPONINI:3 in the last 72 hours BNP: No results found for this basename: PROBNP:3 in the last 72 hours D-Dimer: No results found for this basename: DDIMER:2 in the last 72 hours CBG: No results found for this basename: GLUCAP:6 in the last 72 hours Hemoglobin A1C: No results found for this basename: HGBA1C in the last 72 hours Fasting Lipid Panel: No results found for this basename: CHOL,HDL,LDLCALC,TRIG,CHOLHDL,LDLDIRECT in the last 72 hours Thyroid Function Tests: No results found for this basename:  TSH,T4TOTAL,FREET4,T3FREE,THYROIDAB in the last 72 hours Anemia Panel: No results found for this basename: VITAMINB12,FOLATE,FERRITIN,TIBC,IRON,RETICCTPCT in the last 72 hours Coagulation: No results found for this basename: LABPROT:2,INR:2 in the last 72 hours Urine Drug Screen: Drugs of Abuse  No results found for this basename: labopia,  cocainscrnur,  labbenz,  amphetmu,  thcu,  labbarb    Alcohol Level: No results found for this basename: ETH:2 in the last 72 hours Urinalysis: No results found for this basename: COLORURINE:2,APPERANCEUR:2,LABSPEC:2,PHURINE:2,GLUCOSEU:2,HGBUR:2,BILIRUBINUR:2,KETONESUR:2,PROTEINUR:2,UROBILINOGEN:2,NITRITE:2,LEUKOCYTESUR:2 in the last 72 hours Misc. Labs:   Micro: No results found for this or any previous visit (from the past 240 hour(s)).  Studies/Results: Dg Chest Port 1 View  12/06/2011  *RADIOLOGY REPORT*  Clinical Data: Shortness of breath.  PORTABLE CHEST - 1 VIEW  Comparison: 05/13/2011  Findings: There is marked lucency in the upper lungs suggesting emphysematous changes.  Prominent lung markings in the lower chest are unchanged.  No evidence for airspace disease.  Mild fullness in the right paratracheal region is unchanged.  The heart size is stable.  IMPRESSION: Emphysematous changes without acute chest findings.   Original Report Authenticated By: [name], M.D.     Medications:  Scheduled:   . albuterol  2.5 mg Nebulization  Q4H  . albuterol  5 mg Nebulization Once  . albuterol  5 mg Nebulization Once  . benazepril  40 mg Oral QHS  . benzonatate  100 mg Oral TID  . enoxaparin (LOVENOX) injection  40 mg Subcutaneous Q24H  . Fluticasone-Salmeterol  1 puff Inhalation Q12H  . folic acid  1 mg Oral Daily  . guaiFENesin  600 mg Oral BID  . insulin aspart  0-20 Units Subcutaneous TID WC  . insulin aspart  0-5 Units Subcutaneous QHS  . insulin glargine  15 Units Subcutaneous QHS  . ipratropium  0.5 mg Nebulization Once  . ipratropium   0.5 mg Nebulization Q4H  . levofloxacin (LEVAQUIN) IV  500 mg Intravenous Q24H  . loratadine  10 mg Oral QHS  . methotrexate  10 mg Oral Q Sat-1800  . methylPREDNISolone (SOLU-MEDROL) injection  60 mg Intravenous Q6H  . nicotine  14 mg Transdermal Daily  . sodium chloride  3 mL Intravenous Q12H  . DISCONTD: sodium chloride  3 mL Intravenous Q12H   Continuous:   . sodium chloride     ZOX:[id number], acetaminophen, albuterol, hydrALAZINE, ondansetron (ZOFRAN) IV, ondansetron, DISCONTD: sodium chloride, DISCONTD: sodium chloride  Assessment: Principal Problem:  *COPD (chronic obstructive pulmonary disease) with acute bronchitis Active Problems:  Chronic kidney disease, stage 2, mildly decreased GFR  Accelerated hypertension  Acute renal insufficiency  Hyperglycemia, drug-induced  HTN (hypertension), malignant  Tobacco abuse disorder    1. COPD with acute bronchitis. We'll continue Levaquin, Solu-Medrol, and bronchodilator therapy with Advair, Atrovent nebulizer and albuterol nebulizers. Because of a possible allergic component, we'll add Claritin.  History of tobacco abuse. The patient stopped smoking over one year ago, but he still maintains nicotine replacement therapy. We'll continue nicotine patch as ordered.  Malignant hypertension. He is on benazepril chronically. His blood pressure is better compared to admission blood pressure.  Acute renal insufficiency superimposed on stage II chronic kidney disease. His creatinine will be monitored closely while on ACE inhibitor therapy.  Steroid-induced hyperglycemia. We'll add insulin.  Psoriasis. Per the recommendation by pharmacy, we'll hold methotrexate.  Mild leukopenia. This will be monitored.   Plan: 1. Will add gentle IV fluids. 2. We'll monitor his blood pressure and renal function over the next 24 hours. If his blood pressure remains uncontrolled, will consider adding another agent such as Toprol or Norvasc. We'll  consider decreasing the dose of benazepril or stopping it if his creatinine increases. 3. Add sliding scale NovoLog and Lantus. 4.Tessalon Perles and Claritin added for symptomatic relief.     LOS: 1 day   [name] 12/07/2011, 10:23 AM

## 2011-12-08 DIAGNOSIS — J209 Acute bronchitis, unspecified: Secondary | ICD-10-CM

## 2011-12-08 DIAGNOSIS — Z9109 Other allergy status, other than to drugs and biological substances: Secondary | ICD-10-CM | POA: Diagnosis present

## 2011-12-08 DIAGNOSIS — J441 Chronic obstructive pulmonary disease with (acute) exacerbation: Secondary | ICD-10-CM

## 2011-12-08 LAB — GLUCOSE, CAPILLARY: Glucose-Capillary: 104 mg/dL — ABNORMAL HIGH (ref 70–99)

## 2011-12-08 LAB — CBC
HCT: 42.8 % (ref 39.0–52.0)
MCH: 30.8 pg (ref 26.0–34.0)
MCHC: 34.1 g/dL (ref 30.0–36.0)
MCV: 90.3 fL (ref 78.0–100.0)
Platelets: 308 10*3/uL (ref 150–400)
RDW: 12.5 % (ref 11.5–15.5)

## 2011-12-08 LAB — BASIC METABOLIC PANEL
BUN: 21 mg/dL (ref 6–23)
CO2: 26 mEq/L (ref 19–32)
Calcium: 9.4 mg/dL (ref 8.4–10.5)
Chloride: 101 mEq/L (ref 96–112)
Creatinine, Ser: 1.3 mg/dL (ref 0.50–1.35)

## 2011-12-08 MED ORDER — PREDNISONE 10 MG PO TABS: ORAL_TABLET | ORAL | Status: DC

## 2011-12-08 MED ORDER — CETIRIZINE HCL 10 MG PO TABS: 10.0000 mg | ORAL_TABLET | Freq: Every day | ORAL | Status: DC

## 2011-12-08 MED ORDER — LEVOFLOXACIN 500 MG PO TABS: 500.0000 mg | ORAL_TABLET | Freq: Every day | ORAL | Status: DC

## 2011-12-08 NOTE — Discharge Summary (Signed)
Chart reviewed.  Physician Discharge Summary  Patient ID: ISAC LINCKS MRN: [id number] DOB/AGE: 04/28/1955 56 y.o.  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      benazepril 20 MG tablet   Commonly known as: LOTENSIN   Take 2 tablets (40 mg total) by mouth at bedtime.      cetirizine 10 MG tablet   Commonly known as: ZYRTEC   Take 1 tablet (10 mg total) by mouth daily.      folic acid 1 MG tablet   Commonly known as: FOLVITE   Take 1 mg by mouth daily.      ipratropium-albuterol 0.5-2.5 (3) MG/3ML Soln   Commonly known as: DUONEB   Take 3 mLs by nebulization 2 (two) times daily as needed. For shortness of breath      levofloxacin 500 MG tablet   Commonly known as: LEVAQUIN   Take 1 tablet (500 mg total) by mouth daily.      methotrexate 2.5 MG tablet   Take 7.5 mg by mouth once a week. On Saturday      nicotine 21 mg/24hr patch   Commonly known as: NICODERM CQ - dosed in mg/24 hours   Place 1 patch onto the skin daily.      predniSONE 10 MG tablet   Commonly known as: DELTASONE   4 tablets daily for 2 days then decrease by 1 tablet every 2 days until off.      VENTOLIN HFA 108 (90 BASE) MCG/ACT inhaler   Generic drug: albuterol   Inhale 2 puffs into the lungs every 4 (four) hours as needed. For shortness of breath            Discharge Orders    Future Orders Please Complete By Expires   Diet - low sodium heart healthy      Activity as tolerated - No restrictions         Follow-up Information    Follow up with HAWKINS,EDWARD L, MD. In 1 week.   Contact information:   [address] [place] [address] [phone]          Disposition: 01-Home or Self Care  Discharged Condition: stable  Consults:  none  Labs:   Results for orders placed during the hospital encounter of 12/06/11 (from the past 48 hour(s))  BASIC METABOLIC PANEL     Status: Abnormal   Collection Time   12/06/11  9:50 PM      Component Value Range Comment   Sodium 138  135 - 145 mEq/L    Potassium 3.6  3.5 - 5.1 mEq/L    Chloride 100  96 - 112 mEq/L    CO2 26  19 - 32 mEq/L    Glucose, Bld 106 (*) 70 - 99 mg/dL    BUN 13  6 - 23 mg/dL    Creatinine, Ser 2.13  0.50 - 1.35 mg/dL    Calcium 9.7  8.4 - 08.6 mg/dL  GFR calc non Af Amer 58 (*) >90 mL/min    GFR calc Af Amer 68 (*) >90 mL/min   CBC     Status: Normal   Collection Time   12/06/11  9:50 PM      Component Value Range Comment   WBC 5.9  4.0 - 10.5 K/uL    RBC 5.12  4.22 - 5.81 MIL/uL    Hemoglobin 15.8  13.0 - 17.0 g/dL    HCT 16.1  09.6 - 04.5 %    MCV 90.4  78.0 - 100.0 fL    MCH 30.9  26.0 - 34.0 pg    MCHC 34.1  30.0 - 36.0 g/dL    RDW 40.9  81.1 - 91.4 %    Platelets 256  150 - 400 K/uL   POCT I-STAT TROPONIN I     Status: Normal   Collection Time   12/06/11 10:25 PM      Component Value Range Comment   Troponin i, poc 0.03  0.00 - 0.08 ng/mL    Comment 3            COMPREHENSIVE METABOLIC PANEL     Status: Abnormal   Collection Time   12/07/11  5:53 AM      Component Value Range Comment   Sodium 138  135 - 145 mEq/L    Potassium 3.5  3.5 - 5.1 mEq/L    Chloride 100  96 - 112 mEq/L    CO2 25  19 - 32 mEq/L    Glucose, Bld 153 (*) 70 - 99 mg/dL    BUN 19  6 - 23 mg/dL    Creatinine, Ser 7.82 (*) 0.50 - 1.35 mg/dL    Calcium 9.5  8.4 - 95.6 mg/dL    Total Protein 7.1  6.0 - 8.3 g/dL    Albumin 3.6  3.5 - 5.2 g/dL    AST 18  0 - 37 U/L    ALT 18  0 - 53 U/L    Alkaline  Phosphatase 70  39 - 117 U/L    Total Bilirubin 0.4  0.3 - 1.2 mg/dL    GFR calc non Af Amer 45 (*) >90 mL/min    GFR calc Af Amer 52 (*) >90 mL/min   CBC     Status: Abnormal   Collection Time   12/07/11  5:53 AM      Component Value Range Comment   WBC 3.8 (*) 4.0 - 10.5 K/uL    RBC 4.75  4.22 - 5.81 MIL/uL    Hemoglobin 14.5  13.0 - 17.0 g/dL    HCT 21.3  08.6 - 57.8 %    MCV 90.5  78.0 - 100.0 fL    MCH 30.5  26.0 - 34.0 pg    MCHC 33.7  30.0 - 36.0 g/dL    RDW 46.9  62.9 - 52.8 %    Platelets 237  150 - 400 K/uL   GLUCOSE, CAPILLARY     Status: Abnormal   Collection Time   12/07/11 11:46 AM      Component Value Range Comment   Glucose-Capillary 148 (*) 70 - 99 mg/dL    Comment 1 Documented in Chart      Comment 2 Notify RN     GLUCOSE, CAPILLARY     Status: Abnormal   Collection Time   12/07/11  4:18 PM      Component Value Range Comment   Glucose-Capillary 142 (*) 70 - 99  mg/dL    Comment 1 Documented in Chart      Comment 2 Notify RN     GLUCOSE, CAPILLARY     Status: Abnormal   Collection Time   12/07/11  9:07 PM      Component Value Range Comment   Glucose-Capillary 149 (*) 70 - 99 mg/dL   BASIC METABOLIC PANEL     Status: Abnormal   Collection Time   12/08/11  6:25 AM      Component Value Range Comment   Sodium 140  135 - 145 mEq/L    Potassium 3.6  3.5 - 5.1 mEq/L    Chloride 101  96 - 112 mEq/L    CO2 26  19 - 32 mEq/L    Glucose, Bld 135 (*) 70 - 99 mg/dL    BUN 21  6 - 23 mg/dL    Creatinine, Ser 2.95  0.50 - 1.35 mg/dL    Calcium 9.4  8.4 - 28.4 mg/dL    GFR calc non Af Amer 59 (*) >90 mL/min    GFR calc Af Amer 69 (*) >90 mL/min   CBC     Status: Abnormal   Collection Time   12/08/11  6:25 AM      Component Value Range Comment   WBC 17.0 (*) 4.0 - 10.5 K/uL    RBC 4.74  4.22 - 5.81 MIL/uL    Hemoglobin 14.6  13.0 - 17.0 g/dL    HCT 13.2  44.0 - 10.2 %    MCV 90.3  78.0 - 100.0 fL    MCH 30.8  26.0 - 34.0 pg    MCHC 34.1  30.0 - 36.0 g/dL    RDW  72.5  36.6 - 44.0 %    Platelets 308  150 - 400 K/uL   GLUCOSE, CAPILLARY     Status: Abnormal   Collection Time   12/08/11  7:51 AM      Component Value Range Comment   Glucose-Capillary 125 (*) 70 - 99 mg/dL    Comment 1 Notify RN      Comment 2 Documented in Chart       Diagnostics:  Dg Chest Port 1 View  12/06/2011  *RADIOLOGY REPORT*  Clinical Data: Shortness of breath.  PORTABLE CHEST - 1 VIEW  Comparison: 05/13/2011  Findings: There is marked lucency in the upper lungs suggesting emphysematous changes.  Prominent lung markings in the lower chest are unchanged.  No evidence for airspace disease.  Mild fullness in the right paratracheal region is unchanged.  The heart size is stable.  IMPRESSION: Emphysematous changes without acute chest findings.   Original Report Authenticated By: Richarda Overlie, M.D.    EKG: Sinus tachycardia Nonspecific ST and T wave abnormality  Full Code   Hospital Course: See H&P for complete admission details. Mr. Moehring is a 56 year old black male with a history of COPD who presents with cough and shortness of breath. He recently moved into a new house. He noted that his shortness of breath always worsened while at home. He has noticed a lot of mold in the house and attributes his symptoms to the mold. He did have increase in sputum production. He had recently come off a steroid burst. In the emergency room, patient was noted to have tachycardia, tachypnea. He was in no distress. His lung exam revealed bilateral wheezing and rhonchi. Chest x-ray showed nothing acute. Patient was admitted and started on steroids, bronchodilators, antibiotics. He also was started on antihistamines.  By the time of discharge, his lungs are clear, he's feeling back to baseline. He plans on getting a cleaning company to assist with the eradication of the mold. He is followed by Dr. Juanetta Gosling as an outpatient for his lung disease and plans on following up with him. Total time on the day of  discharge greater than 30 minutes.  Discharge Exam:  Blood pressure 143/81, pulse 111, temperature 98.2 F (36.8 C), temperature source Oral, resp. rate 20, height 5\' 9"  (1.753 m), weight 108.863 kg (240 lb), SpO2 96.00%.  Gen:  Comfortable, talkative Lungs:  Diminished throughout without WRR. Breathing nonlabored CV RRR without MGR Ext no CCE  Signed: Kimberlly Norgard L 12/08/2011, 9:10 AM

## 2011-12-08 NOTE — Progress Notes (Signed)
Pt discharge home with instructions, prescriptions, and care notes. Pt verbalized understanding and was transferred via w/c with staff in stable condition. No further complaints or concerns voiced at this time. Spoke with Chasity of [facility] and she states that the pt was put in the system there and that one of their nurses would be calling him at home. I will call him to notify him of the situation.

## 2011-12-12 NOTE — Progress Notes (Signed)
UR chart review completed.  

## 2012-01-27 ENCOUNTER — Telehealth: Payer: Self-pay | Admitting: Orthopedic Surgery

## 2012-01-27 NOTE — Telephone Encounter (Signed)
Patient called about left knee, states still hurting.  Had been managing with medication as per last office note 11/20/11.  States possible surgery discussed.  Schedule follow up appointment or other advice?   Patient ph# [phone].

## 2012-01-27 NOTE — Telephone Encounter (Signed)
He decided to use pain medication vs surgery   Does he just need a new prescription  When you find out let me know verbally

## 2012-01-28 ENCOUNTER — Other Ambulatory Visit: Payer: Self-pay | Admitting: Orthopedic Surgery

## 2012-01-28 DIAGNOSIS — M25569 Pain in unspecified knee: Secondary | ICD-10-CM

## 2012-01-28 MED ORDER — HYDROCODONE-ACETAMINOPHEN 5-325 MG PO TABS: 1.0000 | ORAL_TABLET | Freq: Four times a day (QID) | ORAL | Status: DC | PRN

## 2012-01-28 NOTE — Telephone Encounter (Signed)
Patient given this information per phone 01/27/12; states will call pharmacy.

## 2012-02-11 ENCOUNTER — Telehealth: Payer: Self-pay | Admitting: Orthopedic Surgery

## 2012-02-11 NOTE — Telephone Encounter (Signed)
Declined no changes

## 2012-02-11 NOTE — Telephone Encounter (Signed)
Patient aware - advised of providers reply.

## 2012-02-17 ENCOUNTER — Other Ambulatory Visit: Payer: Self-pay | Admitting: Orthopedic Surgery

## 2012-02-17 DIAGNOSIS — M25569 Pain in unspecified knee: Secondary | ICD-10-CM

## 2012-02-17 MED ORDER — HYDROCODONE-ACETAMINOPHEN 5-325 MG PO TABS: 1.0000 | ORAL_TABLET | Freq: Four times a day (QID) | ORAL | Status: DC | PRN

## 2012-02-29 ENCOUNTER — Encounter (HOSPITAL_COMMUNITY): Payer: Self-pay | Admitting: *Deleted

## 2012-02-29 ENCOUNTER — Inpatient Hospital Stay (HOSPITAL_COMMUNITY)
Admission: EM | Admit: 2012-02-29 | Discharge: 2012-03-12 | DRG: 870 | Disposition: A | Discharge status: Skilled Nursing Facility | Payer: Medicare Other | Attending: Pulmonary Disease | Admitting: Pulmonary Disease

## 2012-02-29 DIAGNOSIS — R6520 Severe sepsis without septic shock: Secondary | ICD-10-CM | POA: Diagnosis present

## 2012-02-29 DIAGNOSIS — N183 Chronic kidney disease, stage 3 unspecified: Secondary | ICD-10-CM | POA: Diagnosis present

## 2012-02-29 DIAGNOSIS — L409 Psoriasis, unspecified: Secondary | ICD-10-CM

## 2012-02-29 DIAGNOSIS — R000 Tachycardia, unspecified: Secondary | ICD-10-CM | POA: Diagnosis present

## 2012-02-29 DIAGNOSIS — G4730 Sleep apnea, unspecified: Secondary | ICD-10-CM | POA: Diagnosis present

## 2012-02-29 DIAGNOSIS — A419 Sepsis, unspecified organism: Principal | ICD-10-CM | POA: Diagnosis present

## 2012-02-29 DIAGNOSIS — E669 Obesity, unspecified: Secondary | ICD-10-CM | POA: Diagnosis present

## 2012-02-29 DIAGNOSIS — I498 Other specified cardiac arrhythmias: Secondary | ICD-10-CM | POA: Diagnosis present

## 2012-02-29 DIAGNOSIS — N289 Disorder of kidney and ureter, unspecified: Secondary | ICD-10-CM | POA: Diagnosis present

## 2012-02-29 DIAGNOSIS — I129 Hypertensive chronic kidney disease with stage 1 through stage 4 chronic kidney disease, or unspecified chronic kidney disease: Secondary | ICD-10-CM | POA: Diagnosis present

## 2012-02-29 DIAGNOSIS — Z79899 Other long term (current) drug therapy: Secondary | ICD-10-CM

## 2012-02-29 DIAGNOSIS — J9600 Acute respiratory failure, unspecified whether with hypoxia or hypercapnia: Secondary | ICD-10-CM | POA: Diagnosis present

## 2012-02-29 DIAGNOSIS — J4489 Other specified chronic obstructive pulmonary disease: Secondary | ICD-10-CM | POA: Diagnosis present

## 2012-02-29 DIAGNOSIS — J449 Chronic obstructive pulmonary disease, unspecified: Secondary | ICD-10-CM

## 2012-02-29 DIAGNOSIS — R739 Hyperglycemia, unspecified: Secondary | ICD-10-CM

## 2012-02-29 DIAGNOSIS — M48061 Spinal stenosis, lumbar region without neurogenic claudication: Secondary | ICD-10-CM | POA: Diagnosis present

## 2012-02-29 DIAGNOSIS — J9690 Respiratory failure, unspecified, unspecified whether with hypoxia or hypercapnia: Secondary | ICD-10-CM

## 2012-02-29 DIAGNOSIS — N179 Acute kidney failure, unspecified: Secondary | ICD-10-CM

## 2012-02-29 DIAGNOSIS — J189 Pneumonia, unspecified organism: Secondary | ICD-10-CM | POA: Diagnosis present

## 2012-02-29 DIAGNOSIS — J101 Influenza due to other identified influenza virus with other respiratory manifestations: Secondary | ICD-10-CM | POA: Diagnosis present

## 2012-02-29 DIAGNOSIS — N189 Chronic kidney disease, unspecified: Secondary | ICD-10-CM | POA: Diagnosis present

## 2012-02-29 DIAGNOSIS — N182 Chronic kidney disease, stage 2 (mild): Secondary | ICD-10-CM | POA: Diagnosis present

## 2012-02-29 DIAGNOSIS — N170 Acute kidney failure with tubular necrosis: Secondary | ICD-10-CM | POA: Diagnosis present

## 2012-02-29 DIAGNOSIS — L408 Other psoriasis: Secondary | ICD-10-CM | POA: Diagnosis present

## 2012-02-29 DIAGNOSIS — J441 Chronic obstructive pulmonary disease with (acute) exacerbation: Secondary | ICD-10-CM | POA: Diagnosis present

## 2012-02-29 DIAGNOSIS — F1910 Other psychoactive substance abuse, uncomplicated: Secondary | ICD-10-CM | POA: Diagnosis present

## 2012-02-29 DIAGNOSIS — Z87891 Personal history of nicotine dependence: Secondary | ICD-10-CM

## 2012-02-29 DIAGNOSIS — I10 Essential (primary) hypertension: Secondary | ICD-10-CM

## 2012-02-29 MED ORDER — ALBUTEROL SULFATE (5 MG/ML) 0.5% IN NEBU
2.5000 mg | INHALATION_SOLUTION | Freq: Once | RESPIRATORY_TRACT | Status: AC
  Administered 2012-03-01: 2.5 mg via RESPIRATORY_TRACT
  Filled 2012-02-29: qty 0.5

## 2012-02-29 MED ORDER — ETOMIDATE 2 MG/ML IV SOLN
INTRAVENOUS | Status: AC
  Administered 2012-02-29: 20 mg
  Filled 2012-02-29: qty 20

## 2012-02-29 MED ORDER — PROPOFOL 10 MG/ML IV EMUL
INTRAVENOUS | Status: AC
  Filled 2012-02-29: qty 100

## 2012-02-29 MED ORDER — LIDOCAINE HCL (CARDIAC) 20 MG/ML IV SOLN
INTRAVENOUS | Status: AC
  Filled 2012-02-29: qty 5

## 2012-02-29 MED ORDER — LEVOFLOXACIN IN D5W 750 MG/150ML IV SOLN
750.0000 mg | Freq: Once | INTRAVENOUS | Status: AC
  Administered 2012-03-01: 750 mg via INTRAVENOUS
  Filled 2012-02-29: qty 150

## 2012-02-29 MED ORDER — ROCURONIUM BROMIDE 50 MG/5ML IV SOLN
INTRAVENOUS | Status: AC
  Administered 2012-02-29: 100 mg
  Filled 2012-02-29: qty 2

## 2012-02-29 MED ORDER — SUCCINYLCHOLINE CHLORIDE 20 MG/ML IJ SOLN
INTRAMUSCULAR | Status: AC
  Filled 2012-02-29: qty 1

## 2012-02-29 MED ORDER — IPRATROPIUM BROMIDE 0.02 % IN SOLN
0.5000 mg | Freq: Once | RESPIRATORY_TRACT | Status: AC
  Administered 2012-03-01: 0.5 mg via RESPIRATORY_TRACT
  Filled 2012-02-29: qty 2.5

## 2012-02-29 NOTE — ED Notes (Signed)
Pt arrived from home via ems d/t respiratory distress. Pt was given 2 albuterol 1 Atrovent and 125 prior to arrival. Pt took two breathing tx during the day before ems.

## 2012-02-29 NOTE — ED Provider Notes (Signed)
History     CSN: [id number]  Arrival date & time 02/29/12  2339   First MD Initiated Contact with Patient 02/29/12 2339    Chief Complaint  Patient presents with  . Respiratory Distress    (Consider location/radiation/quality/duration/timing/severity/associated sxs/prior treatment) HPI Level 5 caveat due to respiratory arrest [name] is a 56 y.o. male with a h/o DM, CRI, HTN, COPD, Spinal stenosis, sleep apnea,  brought in by ambulance, who presents to the Emergency Department complaining of shortness of breath that has been present for 3 days. Per EMS patient was short of breath and tachycardic at the home. They gave albuterol, solumedrol and placed on CPAP.Upon arrival patient was unresponsive, in acute respiratory distress.  PCP Dr. Jarold Motto Pulmonologist Dr. Juanetta Gosling  Past Medical History  Diagnosis Date  . Hypertension   . Dyspnea   . Chronic kidney disease, stage 2, mildly decreased GFR     Creatinine of 1.31 in 04/2011  . COPD (chronic obstructive pulmonary disease)     moderate by spirometry in 08/2009;FEV1 of 1.1 ;nl alpha -1 antitrypsin   . Obesity   . Psoriasis   . Left eye trauma     with loss of vision  . Erectile dysfunction   . Lumbosacral spinal stenosis     chronic low back pain  . Headache   . Sleep apnea   . Carotid artery aneurysm     Past Surgical History  Procedure Date  . Orif patella 12/14/2005    left fracture Dr.Harrison   . Ophthalmologic surgery 1963    secondary to left eye trauma    Family History  Problem Relation Age of Onset  . Heart disease    . Arthritis    . Lung disease    . Cancer    . Asthma      History  Substance Use Topics  . Smoking status: Former Smoker -- 1.0 packs/day for 20 years    Quit date: 06/10/2010  . Smokeless tobacco: Never Used     Comment: Quit in 06/2010; may have subsequently resumed  . Alcohol Use: 0.5 oz/week    1 drink(s) per week      Review of Systems  Unable to perform  ROS: Other    Allergies  Bee venom  Home Medications   Current Outpatient Rx  Name  Route  Sig  Dispense  Refill  . ALBUTEROL SULFATE HFA 108 (90 BASE) MCG/ACT IN AERS   Inhalation   Inhale 2 puffs into the lungs every 4 (four) hours as needed. For shortness of breath         . BENAZEPRIL HCL 20 MG PO TABS   Oral   Take 2 tablets (40 mg total) by mouth at bedtime.         Marland Kitchen CETIRIZINE HCL 10 MG PO TABS   Oral   Take 1 tablet (10 mg total) by mouth daily.         Marland Kitchen FLUTICASONE-SALMETEROL 250-50 MCG/DOSE IN AEPB   Inhalation   Inhale 1 puff into the lungs every 12 (twelve) hours.           Marland Kitchen FOLIC ACID 1 MG PO TABS   Oral   Take 1 mg by mouth daily.         Marland Kitchen HYDROCODONE-ACETAMINOPHEN 5-325 MG PO TABS   Oral   Take 1 tablet by mouth every 6 (six) hours as needed for pain.   60 tablet   5   .  IPRATROPIUM-ALBUTEROL 0.5-2.5 (3) MG/3ML IN SOLN   Nebulization   Take 3 mLs by nebulization 2 (two) times daily as needed. For shortness of breath         . LEVOFLOXACIN 500 MG PO TABS   Oral   Take 1 tablet (500 mg total) by mouth daily.   3 tablet   0   . METHOTREXATE SODIUM 2.5 MG PO TABS   Oral   Take 7.5 mg by mouth once a week. On Saturday         . NICOTINE 21 MG/24HR TD PT24   Transdermal   Place 1 patch onto the skin daily.         Marland Kitchen PREDNISONE 10 MG PO TABS      4 tablets daily for 2 days then decrease by 1 tablet every 2 days until off.   20 tablet   0     There were no vitals taken for this visit.  Physical Exam  Nursing note and vitals reviewed. Constitutional:       Obese, unresponsive man, in respiratory distress.  HENT:  Head: Normocephalic and atraumatic.  Right Ear: External ear normal.  Left Ear: External ear normal.  Eyes: Right eye exhibits no discharge. Left eye exhibits no discharge.       Left eye irregularity with pupil irregularity  Neck: Neck supple.  Cardiovascular:       tachycardia  Pulmonary/Chest: He has  wheezes. He exhibits no tenderness.       Moving very little air  Abdominal: Soft. Bowel sounds are normal. There is no tenderness. There is no rebound.  Musculoskeletal: He exhibits no tenderness.       Baseline ROM, no obvious new focal weakness.  Neurological:       Unresponsive. GLASGOW COMA SCORE =  3 ( 1+1+1)  Eyes Open: spontaneously 4, to voice 3, to pain 2, none 1 Speech: nml 5, disoriented 4, inapprop. 3, incoherent 2, none 1 Motor: nml 6, localizes 5, withdraws 4, flexor 3, ext. 2, none 1  Skin: No rash noted.       Psoriasis with patches to back, abdomen, arms, legs, buttock    ED Course  Procedures (including critical care time)  INTUBATION Performed by: Annamarie Dawley. Required items: required blood products, implants, devices, and special equipment available Patient identity confirmed: provided demographic data and hospital-assigned identification number Time out: Immediately prior to procedure a "time out" was called to verify the correct patient, procedure, equipment, support staff and site/side marked as required. Indications: Respiratory arrest Intubation method: #4 MAC Preoxygenation: BVM Sedatives: 10 mg Etomidate Paralytic: 100 mg Rocuronium Tube Size: 8 cuffed Post-procedure assessment: chest rise and ETCO2 monitor Breath sounds: equal and absent over the epigastrium Tube secured with: ETT holder Chest x-ray interpreted by radiologist and me. Chest x-ray findings: endotracheal tube in appropriate position Patient tolerated the procedure well with no immediate complications.    Results for orders placed during the hospital encounter of 02/29/12  CBC WITH DIFFERENTIAL      Component Value Range   WBC 10.6 (*) 4.0 - 10.5 K/uL   RBC 4.57  4.22 - 5.81 MIL/uL   Hemoglobin 14.2  13.0 - 17.0 g/dL   HCT 11.9  14.7 - 82.9 %   MCV 93.0  78.0 - 100.0 fL   MCH 31.1  26.0 - 34.0 pg   MCHC 33.4  30.0 - 36.0 g/dL   RDW 56.2  13.0 - 86.5 %   Platelets 255  150  - 400 K/uL   Neutrophils Relative 70  43 - 77 %   Lymphocytes Relative 24  12 - 46 %   Monocytes Relative 5  3 - 12 %   Eosinophils Relative 1  0 - 5 %   Basophils Relative 0  0 - 1 %   Neutro Abs 7.5  1.7 - 7.7 K/uL   Lymphs Abs 2.5  0.7 - 4.0 K/uL   Monocytes Absolute 0.5  0.1 - 1.0 K/uL   Eosinophils Absolute 0.1  0.0 - 0.7 K/uL   Basophils Absolute 0.0  0.0 - 0.1 K/uL   RBC Morphology STOMATOCYTES     WBC Morphology TOXIC GRANULATION    COMPREHENSIVE METABOLIC PANEL      Component Value Range   Sodium 135  135 - 145 mEq/L   Potassium 3.5  3.5 - 5.1 mEq/L   Chloride 97  96 - 112 mEq/L   CO2 25  19 - 32 mEq/L   Glucose, Bld 283 (*) 70 - 99 mg/dL   BUN 16  6 - 23 mg/dL   Creatinine, Ser 1.61 (*) 0.50 - 1.35 mg/dL   Calcium 8.1 (*) 8.4 - 10.5 mg/dL   Total Protein 6.8  6.0 - 8.3 g/dL   Albumin 3.4 (*) 3.5 - 5.2 g/dL   AST 51 (*) 0 - 37 U/L   ALT 36  0 - 53 U/L   Alkaline Phosphatase 64  39 - 117 U/L   Total Bilirubin 0.3  0.3 - 1.2 mg/dL   GFR calc non Af Amer 33 (*) >90 mL/min   GFR calc Af Amer 38 (*) >90 mL/min  TROPONIN I      Component Value Range   Troponin I <0.30  <0.30 ng/mL  PRO B NATRIURETIC PEPTIDE      Component Value Range   Pro B Natriuretic peptide (BNP) 1408.0 (*) 0 - 125 pg/mL  BLOOD GAS, ARTERIAL      Component Value Range   FIO2 100.00     Delivery systems VENTILATOR     Mode PRESSURE REGULATED VOLUME CONTROL     VT 600     Rate 14     Peep/cpap 5.0     pH, Arterial 7.218 (*) 7.350 - 7.450   pCO2 arterial 61.9 (*) 35.0 - 45.0 mmHg   pO2, Arterial 490.0 (*) 80.0 - 100.0 mmHg   Bicarbonate 24.3 (*) 20.0 - 24.0 mEq/L   TCO2 22.4  0 - 100 mmol/L   Acid-base deficit 4.2 (*) 0.0 - 2.0 mmol/L   O2 Saturation 99.6     Patient temperature 98.6     Drawn by COLLECTED BY RT     Sample type ARTERIAL     Allens test (pass/fail) PASS  PASS  PROTIME-INR      Component Value Range   Prothrombin Time 13.2  11.6 - 15.2 seconds   INR 1.01  0.00 - 1.49   URINE RAPID DRUG SCREEN (HOSP PERFORMED)      Component Value Range   Opiates NONE DETECTED  NONE DETECTED   Cocaine POSITIVE (*) NONE DETECTED   Benzodiazepines NONE DETECTED  NONE DETECTED   Amphetamines NONE DETECTED  NONE DETECTED   Tetrahydrocannabinol NONE DETECTED  NONE DETECTED   Barbiturates NONE DETECTED  NONE DETECTED  ETHANOL      Component Value Range   Alcohol, Ethyl (B) <11  0 - 11 mg/dL  GLUCOSE, CAPILLARY      Component Value Range  Glucose-Capillary 272 (*) 70 - 99 mg/dL  URINALYSIS, ROUTINE W REFLEX MICROSCOPIC      Component Value Range   Color, Urine YELLOW  YELLOW   APPearance CLEAR  CLEAR   Specific Gravity, Urine 1.025  1.005 - 1.030   pH 7.0  5.0 - 8.0   Glucose, UA 500 (*) NEGATIVE mg/dL   Hgb urine dipstick LARGE (*) NEGATIVE   Bilirubin Urine NEGATIVE  NEGATIVE   Ketones, ur NEGATIVE  NEGATIVE mg/dL   Protein, ur >161 (*) NEGATIVE mg/dL   Urobilinogen, UA 0.2  0.0 - 1.0 mg/dL   Nitrite NEGATIVE  NEGATIVE   Leukocytes, UA NEGATIVE  NEGATIVE  URINE MICROSCOPIC-ADD ON      Component Value Range   Squamous Epithelial / LPF FEW (*) RARE   WBC, UA 0-2  <3 WBC/hpf   RBC / HPF TOO NUMEROUS TO COUNT  <3 RBC/hpf   Bacteria, UA FEW (*) RARE   Urine-Other AMORPHOUS URATES/PHOSPHATES      Dg Chest Portable 1 View  03/01/2012  *RADIOLOGY REPORT*  Clinical Data: Respiratory distress  PORTABLE CHEST - 1 VIEW  Comparison: 12/06/2011  Findings: Endotracheal tube tip 4 cm proximal to the carina. Cardiomegaly is similar to prior.  Mediastinal prominence has increased. There is evidence of underlying aortic pathology with ectasia and tortuosity.  Mild increased perihilar/ interstitial markings superimposed on chronic peribronchial thickening.  No pleural effusion or pneumothorax.  No acute osseous finding.  IMPRESSION: Mediastinal prominence. If there is clinical concern for acute aortic pathology, CT can be obtained.  Endotracheal tube tip 4 cm proximal to the  carina.  Mild increased perihilar markings may reflect a mild edema or bronchitis superimposed on chronic bronchitic change.  Discussed via telephone with Dr. Colon Branch at 01:20 am on 12/31/2011.   Original Report Authenticated By: Jearld Lesch, M.D.     Date: 02/29/2012  2342  Rate: 156  Rhythm: sinus tachycardia  QRS Axis: normal  Intervals: normal  ST/T Wave abnormalities: ST depressions inferiorly and ST depressions laterally  Conduction Disutrbances:none  Narrative Interpretation:   Old EKG Reviewed: none available and no significant changes c/w 12/06/11 except rate is faster 2345 Dr. Manus Gunning [place] of heart shows motion in all four chambers. 0100 For the tachycardia he was given adenocard 6 mg without affect, cardizem 20 mg without affect. Lopressor 5 mg resulted in HR slowing to 117 from 159 and BP improving.  0110 Patient placed on propofol drip to maintain sedation.  2:20 AM:  T/C to Dr. Phillips Odor, hospitalist, case discussed, including:  HPI, pertinent PM/SHx, VS/PE, dx testing, ED course and treatment.  Agreeable to admission. She will see patient in the ER..   MDM  Patient with a h/o DM, HTN, COPD presented in respiratory arrest. He had been short of  breath for three days prior to arrival. Labs with creatinine of 2.12 which is his baseline. UDS is positive for cocaine.Chest xray without acute findings.Spoke with Dr. Phillips Odor, hospitalist who will admit the patient to the ICU.Pt stable in ED with no significant deterioration in condition.The patient appears reasonably stabilized for admission considering the current resources, flow, and capabilities available in the ED at this time, and I doubt any other [facility] requiring further screening and/or treatment in the ED prior to admission.  MDM Reviewed: nursing note and vitals Interpretation: labs, ECG and x-ray  CRITICAL CARE Performed by: Annamarie Dawley. Total critical care time: 50 Critical care time was exclusive of separately billable  procedures and treating  other patients. Critical care was necessary to treat or prevent imminent or life-threatening deterioration. Critical care was time spent personally by me on the following activities: development of treatment plan with patient and/or surrogate as well as nursing, discussions with consultants, evaluation of patient's response to treatment, examination of patient, obtaining history from patient or surrogate, ordering and performing treatments and interventions, ordering and review of laboratory studies, ordering and review of radiographic studies, pulse oximetry and re-evaluation of patient's condition.         Nicoletta Dress. Colon Branch, MD 03/01/12 [phone]

## 2012-03-01 ENCOUNTER — Inpatient Hospital Stay (HOSPITAL_COMMUNITY): Payer: Medicare Other

## 2012-03-01 ENCOUNTER — Encounter (HOSPITAL_COMMUNITY): Payer: Self-pay | Admitting: *Deleted

## 2012-03-01 ENCOUNTER — Emergency Department (HOSPITAL_COMMUNITY): Payer: Medicare Other

## 2012-03-01 DIAGNOSIS — I10 Essential (primary) hypertension: Secondary | ICD-10-CM

## 2012-03-01 DIAGNOSIS — F1910 Other psychoactive substance abuse, uncomplicated: Secondary | ICD-10-CM | POA: Diagnosis present

## 2012-03-01 DIAGNOSIS — J9600 Acute respiratory failure, unspecified whether with hypoxia or hypercapnia: Secondary | ICD-10-CM | POA: Diagnosis present

## 2012-03-01 LAB — BLOOD GAS, ARTERIAL
Acid-base deficit: 1.8 mmol/L (ref 0.0–2.0)
Acid-base deficit: 4.2 mmol/L — ABNORMAL HIGH (ref 0.0–2.0)
Bicarbonate: 24.9 mEq/L — ABNORMAL HIGH (ref 20.0–24.0)
FIO2: 100 %
MECHVT: 550 mL
MECHVT: 600 mL
PEEP: 5 cmH2O
PEEP: 5 cmH2O
Patient temperature: 37
Patient temperature: 98.6
Pressure control: 35 cmH2O
RATE: 14 resp/min
RATE: 20 resp/min
TCO2: 22.4 mmol/L (ref 0–100)
TCO2: 22.9 mmol/L (ref 0–100)
pCO2 arterial: 52 mmHg — ABNORMAL HIGH (ref 35.0–45.0)
pCO2 arterial: 61.5 mmHg (ref 35.0–45.0)
pCO2 arterial: 61.9 mmHg (ref 35.0–45.0)
pCO2 arterial: 64.4 mmHg (ref 35.0–45.0)
pH, Arterial: 7.218 — ABNORMAL LOW (ref 7.350–7.450)
pH, Arterial: 7.23 — ABNORMAL LOW (ref 7.350–7.450)
pH, Arterial: 7.296 — ABNORMAL LOW (ref 7.350–7.450)
pO2, Arterial: 79.7 mmHg — ABNORMAL LOW (ref 80.0–100.0)

## 2012-03-01 LAB — CBC WITH DIFFERENTIAL/PLATELET
Basophils Absolute: 0 10*3/uL (ref 0.0–0.1)
Eosinophils Absolute: 0.1 10*3/uL (ref 0.0–0.7)
HCT: 42.5 % (ref 39.0–52.0)
Lymphs Abs: 2.5 10*3/uL (ref 0.7–4.0)
MCHC: 33.4 g/dL (ref 30.0–36.0)
MCV: 93 fL (ref 78.0–100.0)
Monocytes Absolute: 0.5 10*3/uL (ref 0.1–1.0)
Monocytes Relative: 5 % (ref 3–12)
Neutro Abs: 7.5 10*3/uL (ref 1.7–7.7)
Platelets: 255 10*3/uL (ref 150–400)
RDW: 13.7 % (ref 11.5–15.5)
WBC: 10.6 10*3/uL — ABNORMAL HIGH (ref 4.0–10.5)

## 2012-03-01 LAB — RAPID URINE DRUG SCREEN, HOSP PERFORMED
Barbiturates: NOT DETECTED
Benzodiazepines: NOT DETECTED
Cocaine: POSITIVE — AB
Opiates: NOT DETECTED

## 2012-03-01 LAB — BASIC METABOLIC PANEL
BUN: 17 mg/dL (ref 6–23)
Chloride: 104 mEq/L (ref 96–112)
Creatinine, Ser: 2.06 mg/dL — ABNORMAL HIGH (ref 0.50–1.35)
GFR calc Af Amer: 39 mL/min — ABNORMAL LOW (ref >90)
GFR calc non Af Amer: 34 mL/min — ABNORMAL LOW (ref >90)

## 2012-03-01 LAB — URINALYSIS, ROUTINE W REFLEX MICROSCOPIC
Glucose, UA: 500 mg/dL — AB
Leukocytes, UA: NEGATIVE
Protein, ur: >300 mg/dL — AB
Specific Gravity, Urine: 1.025 (ref 1.005–1.030)

## 2012-03-01 LAB — URINE MICROSCOPIC-ADD ON

## 2012-03-01 LAB — COMPREHENSIVE METABOLIC PANEL
Albumin: 3.4 g/dL — ABNORMAL LOW (ref 3.5–5.2)
Alkaline Phosphatase: 64 U/L (ref 39–117)
BUN: 16 mg/dL (ref 6–23)
Calcium: 8.1 mg/dL — ABNORMAL LOW (ref 8.4–10.5)
Creatinine, Ser: 2.12 mg/dL — ABNORMAL HIGH (ref 0.50–1.35)
Glucose, Bld: 283 mg/dL — ABNORMAL HIGH (ref 70–99)
Total Protein: 6.8 g/dL (ref 6.0–8.3)

## 2012-03-01 LAB — PROTIME-INR: INR: 1.01 (ref 0.00–1.49)

## 2012-03-01 LAB — CBC
HCT: 40 % (ref 39.0–52.0)
Hemoglobin: 13.1 g/dL (ref 13.0–17.0)
MCH: 30.4 pg (ref 26.0–34.0)
MCV: 92.8 fL (ref 78.0–100.0)
Platelets: 218 10*3/uL (ref 150–400)
RBC: 4.31 MIL/uL (ref 4.22–5.81)
RDW: 13.8 % (ref 11.5–15.5)

## 2012-03-01 LAB — GLUCOSE, CAPILLARY: Glucose-Capillary: 137 mg/dL — ABNORMAL HIGH (ref 70–99)

## 2012-03-01 LAB — TROPONIN I
Troponin I: 0.34 ng/mL (ref <0.30)
Troponin I: <0.3 ng/mL (ref <0.30)

## 2012-03-01 MED ORDER — FOLIC ACID 5 MG/ML IJ SOLN
1.0000 mg | Freq: Every day | INTRAMUSCULAR | Status: DC
  Administered 2012-03-01 – 2012-03-11 (×10): 1 mg via INTRAVENOUS
  Filled 2012-03-01 (×18): qty 0.2

## 2012-03-01 MED ORDER — ENOXAPARIN SODIUM 40 MG/0.4ML ~~LOC~~ SOLN
40.0000 mg | SUBCUTANEOUS | Status: DC
  Administered 2012-03-01 – 2012-03-03 (×3): 40 mg via SUBCUTANEOUS
  Filled 2012-03-01 (×3): qty 0.4

## 2012-03-01 MED ORDER — LEVALBUTEROL HCL 0.63 MG/3ML IN NEBU
0.6300 mg | INHALATION_SOLUTION | RESPIRATORY_TRACT | Status: DC | PRN
  Administered 2012-03-01: 0.63 mg via RESPIRATORY_TRACT
  Filled 2012-03-01 (×2): qty 3

## 2012-03-01 MED ORDER — THIAMINE HCL 100 MG/ML IJ SOLN
100.0000 mg | Freq: Every day | INTRAMUSCULAR | Status: DC
  Administered 2012-03-01 – 2012-03-11 (×11): 100 mg via INTRAVENOUS
  Filled 2012-03-01 (×11): qty 2

## 2012-03-01 MED ORDER — ALBUTEROL SULFATE (5 MG/ML) 0.5% IN NEBU
2.5000 mg | INHALATION_SOLUTION | RESPIRATORY_TRACT | Status: DC
  Administered 2012-03-01 (×2): 2.5 mg via RESPIRATORY_TRACT
  Filled 2012-03-01 (×3): qty 0.5

## 2012-03-01 MED ORDER — LIDOCAINE HCL (CARDIAC) 20 MG/ML IV SOLN
INTRAVENOUS | Status: AC
  Filled 2012-03-01: qty 5

## 2012-03-01 MED ORDER — ASPIRIN 300 MG RE SUPP: 300.0000 mg | RECTAL | Status: AC

## 2012-03-01 MED ORDER — PROPOFOL 10 MG/ML IV EMUL
5.0000 ug/kg/min | Freq: Once | INTRAVENOUS | Status: AC
  Administered 2012-03-01: 40 ug/kg/min via INTRAVENOUS

## 2012-03-01 MED ORDER — METHYLPREDNISOLONE SODIUM SUCC 125 MG IJ SOLR
INTRAMUSCULAR | Status: AC
  Filled 2012-03-01: qty 2

## 2012-03-01 MED ORDER — PROPOFOL 10 MG/ML IV EMUL
5.0000 ug/kg/min | INTRAVENOUS | Status: DC
  Administered 2012-03-01: 40 ug/kg/min via INTRAVENOUS
  Administered 2012-03-01: 50 ug/kg/min via INTRAVENOUS
  Administered 2012-03-02: 20 ug/kg/min via INTRAVENOUS
  Administered 2012-03-02: 30 ug/kg/min via INTRAVENOUS
  Administered 2012-03-03: 25.116 ug/kg/min via INTRAVENOUS
  Administered 2012-03-03 (×2): 25 ug/kg/min via INTRAVENOUS
  Administered 2012-03-03: 10 ug/kg/min via INTRAVENOUS
  Administered 2012-03-04: 7.752 ug/kg/min via INTRAVENOUS
  Administered 2012-03-04: 5 ug/kg/min via INTRAVENOUS
  Administered 2012-03-04: 25.116 ug/kg/min via INTRAVENOUS
  Administered 2012-03-05: 23.256 ug/kg/min via INTRAVENOUS
  Administered 2012-03-05: 5 ug/kg/min via INTRAVENOUS
  Administered 2012-03-05: 15 ug/kg/min via INTRAVENOUS
  Administered 2012-03-05: 5 ug/kg/min via INTRAVENOUS
  Administered 2012-03-05: 23.256 ug/kg/min via INTRAVENOUS
  Administered 2012-03-06: 30 ug/kg/min via INTRAVENOUS
  Administered 2012-03-06: 15 ug/kg/min via INTRAVENOUS
  Administered 2012-03-06: 20 ug/kg/min via INTRAVENOUS
  Administered 2012-03-07: 30.078 ug/kg/min via INTRAVENOUS
  Administered 2012-03-07: 30 ug/kg/min via INTRAVENOUS
  Filled 2012-03-01 (×23): qty 100

## 2012-03-01 MED ORDER — METHYLPREDNISOLONE SODIUM SUCC 125 MG IJ SOLR
125.0000 mg | Freq: Four times a day (QID) | INTRAMUSCULAR | Status: DC
  Administered 2012-03-01 – 2012-03-10 (×35): 125 mg via INTRAVENOUS
  Filled 2012-03-01 (×35): qty 2

## 2012-03-01 MED ORDER — ALBUTEROL SULFATE (5 MG/ML) 0.5% IN NEBU
INHALATION_SOLUTION | RESPIRATORY_TRACT | Status: AC
  Administered 2012-03-01: 01:00:00
  Filled 2012-03-01: qty 2

## 2012-03-01 MED ORDER — LORAZEPAM 2 MG/ML IJ SOLN
2.0000 mg | INTRAMUSCULAR | Status: DC | PRN
  Administered 2012-03-01 – 2012-03-05 (×5): 2 mg via INTRAVENOUS
  Filled 2012-03-01 (×5): qty 1

## 2012-03-01 MED ORDER — METOPROLOL TARTRATE 1 MG/ML IV SOLN: 10.0000 mg | Freq: Once | INTRAVENOUS | Status: DC

## 2012-03-01 MED ORDER — LEVALBUTEROL HCL 0.63 MG/3ML IN NEBU
0.6300 mg | INHALATION_SOLUTION | RESPIRATORY_TRACT | Status: DC
  Administered 2012-03-01 – 2012-03-12 (×60): 0.63 mg via RESPIRATORY_TRACT
  Filled 2012-03-01 (×61): qty 3

## 2012-03-01 MED ORDER — IPRATROPIUM BROMIDE 0.02 % IN SOLN
0.5000 mg | RESPIRATORY_TRACT | Status: DC
  Administered 2012-03-01 – 2012-03-12 (×63): 0.5 mg via RESPIRATORY_TRACT
  Filled 2012-03-01 (×64): qty 2.5

## 2012-03-01 MED ORDER — ROCURONIUM BROMIDE 50 MG/5ML IV SOLN
0.6000 mg/kg | Freq: Once | INTRAVENOUS | Status: AC
  Administered 2012-03-01: 64.5 mg via INTRAVENOUS
  Filled 2012-03-01: qty 6.45

## 2012-03-01 MED ORDER — METOPROLOL TARTRATE 1 MG/ML IV SOLN
INTRAVENOUS | Status: AC
  Administered 2012-03-01: 5 mg
  Filled 2012-03-01: qty 10

## 2012-03-01 MED ORDER — PROPOFOL 10 MG/ML IV EMUL
INTRAVENOUS | Status: AC
  Filled 2012-03-01: qty 100

## 2012-03-01 MED ORDER — FENTANYL CITRATE 0.05 MG/ML IJ SOLN
INTRAMUSCULAR | Status: AC
  Filled 2012-03-01: qty 50

## 2012-03-01 MED ORDER — IPRATROPIUM BROMIDE 0.02 % IN SOLN
RESPIRATORY_TRACT | Status: AC
  Administered 2012-03-01: 0.5 mg
  Filled 2012-03-01: qty 2.5

## 2012-03-01 MED ORDER — CLONIDINE HCL 0.2 MG PO TABS
0.2000 mg | ORAL_TABLET | Freq: Two times a day (BID) | ORAL | Status: DC
  Administered 2012-03-01 – 2012-03-02 (×3): 0.2 mg via ORAL
  Filled 2012-03-01 (×3): qty 1

## 2012-03-01 MED ORDER — DEXTROSE 5 % IV SOLN
1.0000 g | INTRAVENOUS | Status: DC
  Administered 2012-03-01 – 2012-03-12 (×12): 1 g via INTRAVENOUS
  Filled 2012-03-01 (×13): qty 10

## 2012-03-01 MED ORDER — ALBUTEROL SULFATE HFA 108 (90 BASE) MCG/ACT IN AERS: 4.0000 | INHALATION_SPRAY | RESPIRATORY_TRACT | Status: DC

## 2012-03-01 MED ORDER — ADENOSINE 6 MG/2ML IV SOLN
INTRAVENOUS | Status: AC
  Filled 2012-03-01: qty 2

## 2012-03-01 MED ORDER — BIOTENE DRY MOUTH MT LIQD
15.0000 mL | Freq: Four times a day (QID) | OROMUCOSAL | Status: DC
  Administered 2012-03-01 – 2012-03-12 (×42): 15 mL via OROMUCOSAL

## 2012-03-01 MED ORDER — ASPIRIN 81 MG PO CHEW
324.0000 mg | CHEWABLE_TABLET | ORAL | Status: AC
  Administered 2012-03-01: 324 mg via ORAL
  Filled 2012-03-01: qty 4

## 2012-03-01 MED ORDER — SUCCINYLCHOLINE CHLORIDE 20 MG/ML IJ SOLN
INTRAMUSCULAR | Status: AC
  Filled 2012-03-01: qty 1

## 2012-03-01 MED ORDER — METOPROLOL TARTRATE 1 MG/ML IV SOLN
5.0000 mg | Freq: Once | INTRAVENOUS | Status: AC
  Administered 2012-03-01: 5 mg via INTRAVENOUS

## 2012-03-01 MED ORDER — SODIUM CHLORIDE 0.9 % IV SOLN
10.0000 ug/h | INTRAVENOUS | Status: DC
  Administered 2012-03-01: 10 ug/h via INTRAVENOUS
  Administered 2012-03-03: 50 ug/h via INTRAVENOUS
  Administered 2012-03-04: 25 ug/h via INTRAVENOUS
  Filled 2012-03-01 (×4): qty 50

## 2012-03-01 MED ORDER — PROPOFOL 10 MG/ML IV EMUL
5.0000 ug/kg/min | Freq: Once | INTRAVENOUS | Status: DC
  Administered 2012-03-01: 5 ug/kg/min via INTRAVENOUS

## 2012-03-01 MED ORDER — ROCURONIUM BROMIDE 50 MG/5ML IV SOLN
INTRAVENOUS | Status: AC
  Filled 2012-03-01: qty 2

## 2012-03-01 MED ORDER — LEVALBUTEROL HCL 0.63 MG/3ML IN NEBU
0.6300 mg | INHALATION_SOLUTION | Freq: Every day | RESPIRATORY_TRACT | Status: DC
  Administered 2012-03-01: 0.63 mg via RESPIRATORY_TRACT

## 2012-03-01 MED ORDER — ETOMIDATE 2 MG/ML IV SOLN
INTRAVENOUS | Status: AC
  Filled 2012-03-01: qty 20

## 2012-03-01 MED ORDER — SODIUM CHLORIDE 0.9 % IV SOLN: 250.0000 mL | INTRAVENOUS | Status: DC | PRN

## 2012-03-01 MED ORDER — PANTOPRAZOLE SODIUM 40 MG IV SOLR
40.0000 mg | Freq: Every day | INTRAVENOUS | Status: DC
  Administered 2012-03-01 – 2012-03-10 (×10): 40 mg via INTRAVENOUS
  Filled 2012-03-01 (×9): qty 40

## 2012-03-01 MED ORDER — IPRATROPIUM BROMIDE HFA 17 MCG/ACT IN AERS: 4.0000 | INHALATION_SPRAY | RESPIRATORY_TRACT | Status: DC

## 2012-03-01 MED ORDER — METHYLPREDNISOLONE SODIUM SUCC 125 MG IJ SOLR
60.0000 mg | Freq: Four times a day (QID) | INTRAMUSCULAR | Status: DC
  Administered 2012-03-01 (×2): 60 mg via INTRAVENOUS
  Filled 2012-03-01 (×4): qty 2

## 2012-03-01 MED ORDER — DEXTROSE 5 % IV SOLN
INTRAVENOUS | Status: AC
  Filled 2012-03-01: qty 500

## 2012-03-01 MED ORDER — CHLORHEXIDINE GLUCONATE 0.12 % MT SOLN
15.0000 mL | Freq: Two times a day (BID) | OROMUCOSAL | Status: DC
  Administered 2012-03-01 – 2012-03-07 (×14): 15 mL via OROMUCOSAL
  Filled 2012-03-01 (×14): qty 15

## 2012-03-01 MED ORDER — FENTANYL CITRATE 0.05 MG/ML IJ SOLN
25.0000 ug | INTRAMUSCULAR | Status: DC | PRN
  Administered 2012-03-01: 25 ug via INTRAVENOUS
  Filled 2012-03-01 (×2): qty 2

## 2012-03-01 MED ORDER — DILTIAZEM HCL 25 MG/5ML IV SOLN
INTRAVENOUS | Status: AC
  Filled 2012-03-01: qty 5

## 2012-03-01 MED ORDER — ALBUTEROL SULFATE (5 MG/ML) 0.5% IN NEBU
INHALATION_SOLUTION | RESPIRATORY_TRACT | Status: AC
  Administered 2012-03-01: 2.5 mg
  Filled 2012-03-01: qty 0.5

## 2012-03-01 MED ORDER — SODIUM CHLORIDE 0.9 % IV SOLN
Freq: Once | INTRAVENOUS | Status: AC
  Administered 2012-03-01: 01:00:00 via INTRAVENOUS

## 2012-03-01 MED ORDER — AZITHROMYCIN 500 MG IV SOLR
500.0000 mg | INTRAVENOUS | Status: DC
  Administered 2012-03-01 – 2012-03-12 (×12): 500 mg via INTRAVENOUS
  Filled 2012-03-01 (×13): qty 500

## 2012-03-01 MED ORDER — SODIUM CHLORIDE 0.9 % IV SOLN
INTRAVENOUS | Status: DC
  Administered 2012-03-01: 20 mL/h via INTRAVENOUS
  Administered 2012-03-05 – 2012-03-11 (×2): 10 mL/h via INTRAVENOUS

## 2012-03-01 MED FILL — Medication: Qty: 1 | Status: AC

## 2012-03-01 NOTE — Progress Notes (Signed)
Changed I time to 0.46 from 0.60 , pt has a very long exp phase , I to E is now 1:5.6 . This should reduce his peak airway pressure to 36 mean 9. Gas still pending at 9 pm.

## 2012-03-01 NOTE — ED Notes (Signed)
Diprivan started at 5

## 2012-03-01 NOTE — Progress Notes (Signed)
Patient now resting quietly. VS WNL.

## 2012-03-01 NOTE — Progress Notes (Signed)
Subjective: He was admitted during the night last night with acute respiratory failure. He has what looks like perhaps some volume overload and his BNP was moderately elevated and he has long-standing severe COPD with what I think is chronic respiratory failure. He also has problems with chronic renal failure and hypertension  Objective: Vital signs in last 24 hours: Temp:  [97.8 F (36.6 C)-98.8 F (37.1 C)] 98.8 F (37.1 C) (12/22 0857) Pulse Rate:  [100-152] 109  (12/22 0600) Resp:  [14-37] 35  (12/22 0600) BP: (74-222)/(56-138) 128/87 mmHg (12/22 0600) SpO2:  [97 %-100 %] 100 % (12/22 0805) FiO2 (%):  [39.9 %-100 %] 40 % (12/22 0816) Weight:  [107.5 kg (236 lb 15.9 oz)] 107.5 kg (236 lb 15.9 oz) (12/22 0427) Weight change:  Last BM Date: 02/29/12  Intake/Output from previous day: 12/21 0701 - 12/22 0700 In: 322.6 [I.V.:22.6; IV Piggyback:300] Out: -   PHYSICAL EXAM General appearance: Intubated and sedated Resp: rhonchi bilaterally Cardio: regular rate and rhythm, S1, S2 normal, no murmur, click, rub or gallop GI: soft, non-tender; bowel sounds normal; no masses,  no organomegaly Extremities: extremities normal, atraumatic, no cyanosis or edema  Lab Results:    Basic Metabolic Panel:  Basename 03/01/12 0512 02/29/12 2347  NA 138 135  K 4.0 3.5  CL 104 97  CO2 24 25  GLUCOSE 136* 283*  BUN 17 16  CREATININE 2.06* 2.12*  CALCIUM 7.6* 8.1*  MG -- --  PHOS -- --   Liver Function Tests:  [facility] 02/29/12 2347  AST 51*  ALT 36  ALKPHOS 64  BILITOT 0.3  PROT 6.8  ALBUMIN 3.4*   No results found for this basename: LIPASE:2,AMYLASE:2 in the last 72 hours No results found for this basename: AMMONIA:2 in the last 72 hours CBC:  Basename 03/01/12 0512 02/29/12 2347  WBC 8.6 10.6*  NEUTROABS -- 7.5  HGB 13.1 14.2  HCT 40.0 42.5  MCV 92.8 93.0  PLT 218 255   Cardiac Enzymes:  Basename 03/01/12 0512 02/29/12 2347  CKTOTAL -- --  CKMB -- --  CKMBINDEX  -- --  TROPONINI 0.32* <0.30   BNP:  Basename 02/29/12 2347  PROBNP 1408.0*   D-Dimer: No results found for this basename: DDIMER:2 in the last 72 hours CBG:  Basename 02/29/12 2353  GLUCAP 272*   Hemoglobin A1C: No results found for this basename: HGBA1C in the last 72 hours Fasting Lipid Panel: No results found for this basename: CHOL,HDL,LDLCALC,TRIG,CHOLHDL,LDLDIRECT in the last 72 hours Thyroid Function Tests: No results found for this basename: TSH,T4TOTAL,FREET4,T3FREE,THYROIDAB in the last 72 hours Anemia Panel: No results found for this basename: VITAMINB12,FOLATE,FERRITIN,TIBC,IRON,RETICCTPCT in the last 72 hours Coagulation:  Basename 02/29/12 2347  LABPROT 13.2  INR 1.01   Urine Drug Screen: Drugs of Abuse     Component Value Date/Time   LABOPIA NONE DETECTED 03/01/2012 0019   COCAINSCRNUR POSITIVE* 03/01/2012 0019   LABBENZ NONE DETECTED 03/01/2012 0019   AMPHETMU NONE DETECTED 03/01/2012 0019   THCU NONE DETECTED 03/01/2012 0019   LABBARB NONE DETECTED 03/01/2012 0019    Alcohol Level:  Basename 02/29/12 2347  ETH <11   Urinalysis:  Basename 02/29/12 2353  COLORURINE YELLOW  LABSPEC 1.025  PHURINE 7.0  GLUCOSEU 500*  HGBUR LARGE*  BILIRUBINUR NEGATIVE  KETONESUR NEGATIVE  PROTEINUR >300*  UROBILINOGEN 0.2  NITRITE NEGATIVE  LEUKOCYTESUR NEGATIVE   Misc. Labs:  ABGS  Basename 03/01/12 0510  PHART 7.296*  PO2ART 199.0*  TCO2 22.4  HCO3 24.6*  CULTURES Recent Results (from the past 240 hour(s))  CULTURE, BLOOD (ROUTINE X 2)     Status: Normal (Preliminary result)   Collection Time   02/29/12 11:47 PM      Component Value Range Status Comment   Specimen Description BLOOD LEFT HAND   Final    Special Requests     Final    Value: BOTTLES DRAWN AEROBIC AND ANAEROBIC AEB 6CC ANA 4CC   Culture PENDING   Incomplete    Report Status PENDING   Incomplete   MRSA PCR SCREENING     Status: Abnormal   Collection Time   03/01/12  4:31  AM      Component Value Range Status Comment   MRSA by PCR INVALID RESULTS, SPECIMEN SENT FOR CULTURE (*) NEGATIVE Final   CULTURE, BLOOD (ROUTINE X 2)     Status: Normal (Preliminary result)   Collection Time   03/01/12  5:12 AM      Component Value Range Status Comment   Specimen Description BLOOD LEFT HAND   Final    Special Requests     Final    Value: BOTTLES DRAWN AEROBIC AND ANAEROBIC AEB 5CC ANA 6CC   Culture PENDING   Incomplete    Report Status PENDING   Incomplete    Studies/Results: Dg Chest Portable 1 View  03/01/2012  *RADIOLOGY REPORT*  Clinical Data: Respiratory distress  PORTABLE CHEST - 1 VIEW  Comparison: 12/06/2011  Findings: Endotracheal tube tip 4 cm proximal to the carina. Cardiomegaly is similar to prior.  Mediastinal prominence has increased. There is evidence of underlying aortic pathology with ectasia and tortuosity.  Mild increased perihilar/ interstitial markings superimposed on chronic peribronchial thickening.  No pleural effusion or pneumothorax.  No acute osseous finding.  IMPRESSION: Mediastinal prominence. If there is clinical concern for acute aortic pathology, CT can be obtained.  Endotracheal tube tip 4 cm proximal to the carina.  Mild increased perihilar markings may reflect a mild edema or bronchitis superimposed on chronic bronchitic change.  Discussed via telephone with Dr. Colon Branch at 01:20 am on 12/31/2011.   Original Report Authenticated By: Jearld Lesch, M.D.     Medications:  Prior to Admission:  Prescriptions prior to admission  Medication Sig Dispense Refill  . albuterol (VENTOLIN HFA) 108 (90 BASE) MCG/ACT inhaler Inhale 2 puffs into the lungs every 4 (four) hours as needed. For shortness of breath      . benazepril (LOTENSIN) 20 MG tablet Take 2 tablets (40 mg total) by mouth at bedtime.      . cetirizine (ZYRTEC) 10 MG tablet Take 1 tablet (10 mg total) by mouth daily.      . Fluticasone-Salmeterol (ADVAIR DISKUS) 250-50 MCG/DOSE AEPB  Inhale 1 puff into the lungs every 12 (twelve) hours.        . folic acid (FOLVITE) 1 MG tablet Take 1 mg by mouth daily.      Marland Kitchen HYDROcodone-acetaminophen (NORCO) 5-325 MG per tablet Take 1 tablet by mouth every 6 (six) hours as needed for pain.  60 tablet  5  . ipratropium-albuterol (DUONEB) 0.5-2.5 (3) MG/3ML SOLN Take 3 mLs by nebulization 2 (two) times daily as needed. For shortness of breath      . levofloxacin (LEVAQUIN) 500 MG tablet Take 1 tablet (500 mg total) by mouth daily.  3 tablet  0  . methotrexate 2.5 MG tablet Take 7.5 mg by mouth once a week. On Saturday      . nicotine (NICODERM CQ -  DOSED IN MG/24 HOURS) 21 mg/24hr patch Place 1 patch onto the skin daily.      . predniSONE (DELTASONE) 10 MG tablet 4 tablets daily for 2 days then decrease by 1 tablet every 2 days until off.  20 tablet  0   Scheduled:   . adenosine      . albuterol  2.5 mg Nebulization Q4H  . antiseptic oral rinse  15 mL Mouth Rinse QID  . azithromycin  500 mg Intravenous Q24H  . cefTRIAXone (ROCEPHIN)  IV  1 g Intravenous Q24H  . chlorhexidine  15 mL Mouth/Throat BID  . diltiazem      . enoxaparin (LOVENOX) injection  40 mg Subcutaneous Q24H  . folic acid  1 mg Intravenous Daily  . ipratropium  0.5 mg Nebulization Q4H  . lidocaine (cardiac) 100 mg/15ml      . methylPREDNISolone (SOLU-MEDROL) injection  60 mg Intravenous Q6H  . methylPREDNISolone sodium succinate      . pantoprazole (PROTONIX) IV  40 mg Intravenous QHS  . propofol      . succinylcholine      . thiamine  100 mg Intravenous Daily    Assesment: He has acute respiratory failure and may have some chronic respiratory failure as well. He has COPD which is severe. He has a history of substance abuse and was positive for cocaine on admission. He has hypertension. Principal Problem:  *Respiratory failure, acute Active Problems:  CHRONIC OBSTRUCTIVE PULMONARY DISEASE  Chronic kidney disease, stage 2, mildly decreased GFR  COPD exacerbation   Sinus tachycardia  Substance abuse    Plan: He has sinus tachycardia. I'm concerned that his blood pressure is going to be a problem soon so I'm going to put him on something for that. He may need something for pain. He is not ready to attempt extubation.    LOS: 1 day   Olanda Downie L 03/01/2012, 9:22 AM

## 2012-03-01 NOTE — Progress Notes (Signed)
Patient using accessory muscle. BP not decreasing after 0.2 clonidine. Patient RASS score of -2. HR 120's, O2 Sat 93% BP 167/113 RR 31. Sedation increased to attempt to increase compliance with vent. RT to give PRN breathing tx.

## 2012-03-01 NOTE — Progress Notes (Signed)
Called Dr. Ouida Sills to get order for BP. Order given for 0.2mg  PO Clonidine BID.

## 2012-03-01 NOTE — Progress Notes (Signed)
Patients RASS score -3. Anything less than -3 patient is working hard against vent.

## 2012-03-01 NOTE — ED Provider Notes (Signed)
EMERGENCY DEPARTMENT [place] CARDIAC EXAM "Study: Limited Ultrasound of the heart and pericardium"  INDICATIONS:Tachycardia Multiple views of the heart and pericardium are obtained with a multi-frequency probe.  PERFORMED JY:[id number]  IMAGES ARCHIVED?: No  FINDINGS: No pericardial effusion, Hyperdynamic contractility and Tamponade physiology absent  LIMITATIONS:  Body habitus and Emergent procedure  VIEWS USED: Subcostal 4 chamber and Apical 4 chamber   INTERPRETATION: Cardiac activity present, Pericardial effusioin absent, Cardiac tamponade absent and Increased contractility  COMMENT:  No effusion, No right heart strain   Glynn Octave, MD 03/01/12 1319

## 2012-03-01 NOTE — Progress Notes (Signed)
CPOT score 3. Patient's RR increased to 30's, HR in 120's, BP elevated SBP >150. Fentanyl given for CPOT score along with VS indicators of pain.

## 2012-03-01 NOTE — ED Notes (Signed)
Cardizem 20 mg IV push with no effect.

## 2012-03-01 NOTE — ED Notes (Signed)
Pt occasionally wakes up and is able to nod to questions.

## 2012-03-01 NOTE — ED Notes (Signed)
Family at bedside. Family states they do not need anything at this time.

## 2012-03-01 NOTE — H&P (Signed)
[facility] History and Physical  GRAVES NIPP ZOX:[id number] DOB: 02-14-1956 DOA: 02/29/2012  Referring physician: Eloise Levels PCP: Rulon Sera, MD Specialists: Pulmonology, [facility] Gosling and [facility] (outpatient)    Chief Complaint: Dyspnea, Respirtory Failure  HPI: [name] is a 56 y.o. male with PMH significant for Chronic obstructive pulmonary disease in the setting of ongoing tobacco abuse, status post clean cardiac catheterization February 2012 with an ejection fraction of 55-60%, Hypertension and Stage II chronic kidney disease who also has a history of medical nonadherence according to prior records and his spouse. The patient had a three day history of cough, worsening edema in his LE, shortness of breath and congestion-EMS responded to his house earlier yesterday for a call from his home for shortness of breath-EMS gave him nebulizers which helped and he refused to come to the hospital at that time. This evening his wife could not get him by phone while she was at work and EMS responded to find him severely dyspneic, wheezing, hypoxic and the patient was intubated upon arrival to the ED. He also had tachycardia in the 160's that did not respond to adenosine. BNP is elevated.  In the ED he was stabilized including intubation, given adenosine w/o response, HR came down with intubation and propofol sedation, was given nebs, solumedrol and started on empiric antibiotics for severe COPD exacerbation. CXR showed signs of mild pulmonary edema but no specific infilitrate. His urine drug screen was positive for cocaine. Wife refers to the fact that he "hid things from her" but she really knew there was ongoing substance abuse including smoking crack cocaine, cigarettes and alcohol. Unclear when his last use was-patient's spouse said he may have been using today, but she has been at work and is not sure.  He is now intubated presumably for COPD exacerbation vs. CHF exacerbation  (or both). Hospitalist service requested to admit to [facility] ICU for stabilization and further evaluation and management. Patient has been followed previously by Dr. Juanetta Gosling per his spouse.  Review of Systems: Unable to obtain from patient who is critically ill on ventilator.  Past Medical History  Diagnosis Date  . Hypertension   . Dyspnea   . Chronic kidney disease, stage 2, mildly decreased GFR     Creatinine of 1.31 in 04/2011  . COPD (chronic obstructive pulmonary disease)     moderate by spirometry in 08/2009;FEV1 of 1.1 ;nl alpha -1 antitrypsin   . Obesity   . Psoriasis   . Left eye trauma     with loss of vision  . Erectile dysfunction   . Lumbosacral spinal stenosis     chronic low back pain  . Headache   . Sleep apnea   . Carotid artery aneurysm    Past Surgical History  Procedure Date  . Orif patella 12/14/2005    left fracture Dr.Harrison   . Ophthalmologic surgery 1963    secondary to left eye trauma   Social History:  reports that he quit smoking about 20 months ago. He has never used smokeless tobacco. He reports that he drinks about .5 ounces of alcohol per week. He reports that he does not use illicit drugs. Lives with his "Spouse" although she does say they are not legally married but have lived together for 7 years and he lists her as spouse on all documents and primary contact. He has no children.  Allergies  Allergen Reactions  . Bee Venom Anaphylaxis    Patient has an epi pen  Family History  Problem Relation Age of Onset  . Heart disease    . Arthritis    . Lung disease    . Cancer    . Asthma      Prior to Admission medications   Medication Sig Start Date End Date Taking? Authorizing Provider  albuterol (VENTOLIN HFA) 108 (90 BASE) MCG/ACT inhaler Inhale 2 puffs into the lungs every 4 (four) hours as needed. For shortness of breath    Historical Provider, MD  benazepril (LOTENSIN) 20 MG tablet Take 2 tablets (40 mg total) by mouth at bedtime.  05/16/11   Christiane Ha, MD  cetirizine (ZYRTEC) 10 MG tablet Take 1 tablet (10 mg total) by mouth daily. 12/08/11   Christiane Ha, MD  Fluticasone-Salmeterol (ADVAIR DISKUS) 250-50 MCG/DOSE AEPB Inhale 1 puff into the lungs every 12 (twelve) hours.      Historical Provider, MD  folic acid (FOLVITE) 1 MG tablet Take 1 mg by mouth daily.    Historical Provider, MD  HYDROcodone-acetaminophen (NORCO) 5-325 MG per tablet Take 1 tablet by mouth every 6 (six) hours as needed for pain. 02/17/12   Vickki Hearing, MD  ipratropium-albuterol (DUONEB) 0.5-2.5 (3) MG/3ML SOLN Take 3 mLs by nebulization 2 (two) times daily as needed. For shortness of breath    Historical Provider, MD  levofloxacin (LEVAQUIN) 500 MG tablet Take 1 tablet (500 mg total) by mouth daily. 12/08/11   Christiane Ha, MD  methotrexate 2.5 MG tablet Take 7.5 mg by mouth once a week. On Saturday    Historical Provider, MD  nicotine (NICODERM CQ - DOSED IN MG/24 HOURS) 21 mg/24hr patch Place 1 patch onto the skin daily.    Historical Provider, MD  predniSONE (DELTASONE) 10 MG tablet 4 tablets daily for 2 days then decrease by 1 tablet every 2 days until off. 12/08/11   Christiane Ha, MD   Physical Exam: Filed Vitals:   03/01/12 0145 03/01/12 0158 03/01/12 0215 03/01/12 0254  BP: 100/82 135/92 158/110 110/81  Pulse: 109  112 100  Resp: 19 20 23 22   Height:      SpO2: 99%  100% 100%     General:  Critically ill appearing, intubated, sedated on vent  Eyes: left eye injury-blindness, pupils reactive, injected conjunctiva  ENT: ETT taped in place, nares clear  Neck: soft, no adenolathy or masses  Cardiovascular: mild tachycardia, regular no mrg  Respiratory: no wheezing, air flow sounds normal with vent, some scattered rhonchi in bases  Abdomen: soft, NT +BS  Skin: trace edema in LE  Musculoskeletal: normal  Psychiatric: unable to assess on sedation  Neurologic: unable to assess on sedation, nothing  focal prior to intubation in ED  Labs on Admission:  Basic Metabolic Panel:  Lab 02/29/12 1610  NA 135  K 3.5  CL 97  CO2 25  GLUCOSE 283*  BUN 16  CREATININE 2.12*  CALCIUM 8.1*  MG --  PHOS --   Liver Function Tests:  Lab 02/29/12 2347  AST 51*  ALT 36  ALKPHOS 64  BILITOT 0.3  PROT 6.8  ALBUMIN 3.4*   CBC:  Lab 02/29/12 2347  WBC 10.6*  NEUTROABS 7.5  HGB 14.2  HCT 42.5  MCV 93.0  PLT 255   Cardiac Enzymes:  Lab 02/29/12 2347  CKTOTAL --  CKMB --  CKMBINDEX --  TROPONINI <0.30    BNP (last 3 results)  Basename 02/29/12 2347 05/13/11 2157  PROBNP 1408.0* 919.8*  CBG:  Lab 02/29/12 2353  GLUCAP 272*    Radiological Exams on Admission: Dg Chest Portable 1 View  03/01/2012  *RADIOLOGY REPORT*  Clinical Data: Respiratory distress  PORTABLE CHEST - 1 VIEW  Comparison: 12/06/2011  Findings: Endotracheal tube tip 4 cm proximal to the carina. Cardiomegaly is similar to prior.  Mediastinal prominence has increased. There is evidence of underlying aortic pathology with ectasia and tortuosity.  Mild increased perihilar/ interstitial markings superimposed on chronic peribronchial thickening.  No pleural effusion or pneumothorax.  No acute osseous finding.  IMPRESSION: Mediastinal prominence. If there is clinical concern for acute aortic pathology, CT can be obtained.  Endotracheal tube tip 4 cm proximal to the carina.  Mild increased perihilar markings may reflect a mild edema or bronchitis superimposed on chronic bronchitic change.  Discussed via telephone with Dr. Colon Branch at 01:20 am on 12/31/2011.   Original Report Authenticated By: Jearld Lesch, M.D.     EKG: Independently reviewed. Sinus Tachy. TWI inferior leads  Assessment/Plan Principal Problem:  *Respiratory failure, acute Active Problems:  CHRONIC OBSTRUCTIVE PULMONARY DISEASE  Chronic kidney disease, stage 2, mildly decreased GFR  COPD exacerbation  Sinus tachycardia  Substance  abuse   1. Ventilator Dependent Respiratory Failure, probably related to COPD exacerbation and also component of CHF given high BNP and medication non-adherence. Substance abuse also likely to be a contributing factor to severity of presentation-cannot exclude worsening cardiomyopathy from cocaine use or coronary ischemia or cocaine induced pulmonary injury . Troponin negative, EKG sinus tachy with SE TWI early ischemia suggestive of cocaine intoxication- resolved quickly with sedation. May have also been from WOB/ heart strain, he has been having worsening edema over the past few days with possible CHF.   Admit to ICU, Juanetta Gosling primary? Per spouse- will confirm with AM team   Adult Vent stabilization orders-per CCM  Sedation protocol- has required high levels of sedation and restraint  Lasix when BP can tolerate  Rocephin/Azithromycin empirically  Solumedrol 60mg  q6- slow taper-he has been on steroids for psoriasis for several years intermittently-high risk for AI  Bronchodilators scheduled  Repeat CXR q AM  Wean as tolerated  Monitor renal function, currently at his baseline.  2D echo to eval EF, suspect CHF, cardiomyopathy.  Thiamine.Folate, may need CIWA and benzodiazapines.  Will use labetalol or CCB for BP/Rate control and avoid B Blockers given Pos UDS.   Code Status: Full Code Family Communication: Discussed care with his spouse at bedside Disposition Plan: Critically ill, will be determined.  Time spent: 70 minutes of Critical Care Time.  [facility] Pager [phone]  If 7PM-7AM, please contact night-coverage [email or web address] Password [facility] 03/01/2012, 3:26 AM

## 2012-03-01 NOTE — ED Notes (Signed)
Adenosine 6 mg iv per md orders administered, no effect, MD at bedside.

## 2012-03-01 NOTE — Progress Notes (Signed)
ANTIBIOTIC CONSULT NOTE  Pharmacy Consult for Antibiotic Monitoring Indication: Ventilator Dependent Respiratory Failure  Allergies  Allergen Reactions  . Bee Venom Anaphylaxis    Patient has an epi pen   Patient Measurements: Height: 6' (182.9 cm) Weight: 236 lb 15.9 oz (107.5 kg) IBW/kg (Calculated) : 77.6   Vital Signs: Temp: 97.8 F (36.6 C) (12/22 0452) Temp src: Axillary (12/22 0452) BP: 128/87 mmHg (12/22 0600) Pulse Rate: 109  (12/22 0600) Intake/Output from previous day: 12/21 0701 - 12/22 0700 In: 322.6 [I.V.:22.6; IV Piggyback:300] Out: -  Intake/Output from this shift:   Labs:  [facility] 03/01/12 0512 02/29/12 2347  WBC 8.6 10.6*  HGB 13.1 14.2  PLT 218 255  LABCREA -- --  CREATININE 2.06* 2.12*   Estimated Creatinine Clearance: 50.1 ml/min (by C-G formula based on Cr of 2.06). No results found for this basename: VANCOTROUGH:2,VANCOPEAK:2,VANCORANDOM:2,GENTTROUGH:2,GENTPEAK:2,GENTRANDOM:2,TOBRATROUGH:2,TOBRAPEAK:2,TOBRARND:2,AMIKACINPEAK:2,AMIKACINTROU:2,AMIKACIN:2, in the last 72 hours   Microbiology: Recent Results (from the past 720 hour(s))  CULTURE, BLOOD (ROUTINE X 2)     Status: Normal (Preliminary result)   Collection Time   02/29/12 11:47 PM      Component Value Range Status Comment   Specimen Description BLOOD LEFT HAND   Final    Special Requests     Final    Value: BOTTLES DRAWN AEROBIC AND ANAEROBIC AEB 6CC ANA 4CC   Culture PENDING   Incomplete    Report Status PENDING   Incomplete   CULTURE, BLOOD (ROUTINE X 2)     Status: Normal (Preliminary result)   Collection Time   03/01/12  5:12 AM      Component Value Range Status Comment   Specimen Description BLOOD LEFT HAND   Final    Special Requests     Final    Value: BOTTLES DRAWN AEROBIC AND ANAEROBIC AEB 5CC ANA 6CC   Culture PENDING   Incomplete    Report Status PENDING   Incomplete    Medical History: Past Medical History  Diagnosis Date  . Hypertension   . Dyspnea   .  Chronic kidney disease, stage 2, mildly decreased GFR     Creatinine of 1.31 in 04/2011  . COPD (chronic obstructive pulmonary disease)     moderate by spirometry in 08/2009;FEV1 of 1.1 ;nl alpha -1 antitrypsin   . Obesity   . Psoriasis   . Left eye trauma     with loss of vision  . Erectile dysfunction   . Lumbosacral spinal stenosis     chronic low back pain  . Headache   . Sleep apnea   . Carotid artery aneurysm    Medications:  Scheduled:    . [COMPLETED] sodium chloride   Intravenous Once  . adenosine      . [COMPLETED] albuterol  2.5 mg Nebulization Once  . albuterol  2.5 mg Nebulization Q4H  . [COMPLETED] albuterol      . [COMPLETED] albuterol      . antiseptic oral rinse  15 mL Mouth Rinse QID  . [COMPLETED] aspirin  324 mg Oral NOW   Or  . [COMPLETED] aspirin  300 mg Rectal NOW  . azithromycin  500 mg Intravenous Q24H  . cefTRIAXone (ROCEPHIN)  IV  1 g Intravenous Q24H  . chlorhexidine  15 mL Mouth/Throat BID  . diltiazem      . enoxaparin (LOVENOX) injection  40 mg Subcutaneous Q24H  . [COMPLETED] etomidate      . folic acid  1 mg Intravenous Daily  . [COMPLETED] ipratropium      . [  COMPLETED] ipratropium  0.5 mg Nebulization Once  . ipratropium  0.5 mg Nebulization Q4H  . [COMPLETED] levofloxacin (LEVAQUIN) IV  750 mg Intravenous Once  . lidocaine (cardiac) 100 mg/26ml      . methylPREDNISolone (SOLU-MEDROL) injection  60 mg Intravenous Q6H  . methylPREDNISolone sodium succinate      . [COMPLETED] metoprolol      . [COMPLETED] metoprolol  5 mg Intravenous Once  . pantoprazole (PROTONIX) IV  40 mg Intravenous QHS  . [COMPLETED] propofol      . [COMPLETED] propofol  5-70 mcg/kg/min Intravenous Once  . propofol      . [COMPLETED] rocuronium      . succinylcholine      . thiamine  100 mg Intravenous Daily  . [DISCONTINUED] albuterol  4 puff Inhalation Q4H  . [DISCONTINUED] ipratropium  4 puff Inhalation Q4H  . [DISCONTINUED] metoprolol  10 mg Intravenous Once   . [COMPLETED] propofol  5-70 mcg/kg/min Intravenous Once   Assessment: Okay for Protocol Estimated Creatinine Clearance: 50.1 ml/min (by C-G formula based on Cr of 2.06).  Goal of Therapy:  Eradicate infection.  Plan:  Currently on Azithromycin and Rocephin which do not require adjustments based on renal function or other pharmacokinetic parameters.  Monitor peripherally for a few days in case antibiotic regimen changes.   Lamonte Richer R 03/01/2012,7:28 AM

## 2012-03-01 NOTE — Progress Notes (Signed)
Wake up assessment not performed. Criteria not met. Patient intubated < 24 hr.

## 2012-03-01 NOTE — Progress Notes (Signed)
eLink Physician-Brief Progress Note Patient Name: [name] DOB: 10-Oct-1955 MRN: [id number]  Date of Service  03/01/2012   HPI/Events of Note   Resp acidosis on ABG Remains tachycardic & asynchronous  eICU Interventions  Rocuronium x 1 dose Try to avoid continuous paralysis here (on steroids)   Intervention Category Major Interventions: Acid-Base disturbance - evaluation and management  Anushri Casalino V. 03/01/2012, 9:43 PM

## 2012-03-01 NOTE — Progress Notes (Signed)
eLink Physician-Brief Progress Note Patient Name: [name] DOB: [birth year] MRN: [id number]  Date of Service  03/01/2012   HPI/Events of Note   Vent alarms with 'double clutching' pattern COPD\ CXR reviewed  eICU Interventions  Changed to PC 30/5, RR 20 Ct propofol, add fent gtt ABG in 30 m   Intervention Category Major Interventions: Respiratory failure - evaluation and management  ALVA,RAKESH V. 03/01/2012, 6:23 PM

## 2012-03-02 ENCOUNTER — Inpatient Hospital Stay (HOSPITAL_COMMUNITY): Payer: Medicare Other

## 2012-03-02 ENCOUNTER — Encounter (HOSPITAL_COMMUNITY): Payer: Medicare Other

## 2012-03-02 DIAGNOSIS — J101 Influenza due to other identified influenza virus with other respiratory manifestations: Secondary | ICD-10-CM | POA: Diagnosis present

## 2012-03-02 DIAGNOSIS — N179 Acute kidney failure, unspecified: Secondary | ICD-10-CM | POA: Diagnosis present

## 2012-03-02 DIAGNOSIS — R0602 Shortness of breath: Secondary | ICD-10-CM

## 2012-03-02 LAB — BLOOD GAS, ARTERIAL
Acid-base deficit: 3.7 mmol/L — ABNORMAL HIGH (ref 0.0–2.0)
Bicarbonate: 22.8 mEq/L (ref 20.0–24.0)
FIO2: 0.4 %
MECHVT: 500 mL
MECHVT: 500 mL
PEEP: 5 cmH2O
Patient temperature: 37
RATE: 20 resp/min
TCO2: 21.2 mmol/L (ref 0–100)
pCO2 arterial: 55.5 mmHg — ABNORMAL HIGH (ref 35.0–45.0)
pCO2 arterial: 56.8 mmHg — ABNORMAL HIGH (ref 35.0–45.0)
pH, Arterial: 7.227 — ABNORMAL LOW (ref 7.350–7.450)
pH, Arterial: 7.232 — ABNORMAL LOW (ref 7.350–7.450)
pO2, Arterial: 94.6 mmHg (ref 80.0–100.0)

## 2012-03-02 LAB — CBC WITH DIFFERENTIAL/PLATELET
Basophils Absolute: 0 10*3/uL (ref 0.0–0.1)
Basophils Absolute: 0 10*3/uL (ref 0.0–0.1)
Eosinophils Absolute: 0 10*3/uL (ref 0.0–0.7)
Eosinophils Relative: 0 % (ref 0–5)
Eosinophils Relative: 0 % (ref 0–5)
HCT: 41.6 % (ref 39.0–52.0)
HCT: 37 % — ABNORMAL LOW (ref 39.0–52.0)
Hemoglobin: 13.4 g/dL (ref 13.0–17.0)
Lymphocytes Relative: 3 % — ABNORMAL LOW (ref 12–46)
Lymphocytes Relative: 4 % — ABNORMAL LOW (ref 12–46)
Lymphs Abs: 0.3 10*3/uL — ABNORMAL LOW (ref 0.7–4.0)
MCH: 30.9 pg (ref 26.0–34.0)
MCV: 95 fL (ref 78.0–100.0)
MCV: 93.7 fL (ref 78.0–100.0)
Monocytes Absolute: 0.6 10*3/uL (ref 0.1–1.0)
Monocytes Absolute: 0.5 10*3/uL (ref 0.1–1.0)
Neutro Abs: 8.7 10*3/uL — ABNORMAL HIGH (ref 1.7–7.7)
RBC: 4.38 MIL/uL (ref 4.22–5.81)
RDW: 14.6 % (ref 11.5–15.5)
RDW: 14.5 % (ref 11.5–15.5)
WBC: 9.6 10*3/uL (ref 4.0–10.5)
WBC: 8.3 10*3/uL (ref 4.0–10.5)

## 2012-03-02 LAB — CREATININE, URINE, RANDOM: Creatinine, Urine: 180.61 mg/dL

## 2012-03-02 LAB — BASIC METABOLIC PANEL
BUN: 28 mg/dL — ABNORMAL HIGH (ref 6–23)
Chloride: 99 mEq/L (ref 96–112)
Creatinine, Ser: 3.67 mg/dL — ABNORMAL HIGH (ref 0.50–1.35)
GFR calc Af Amer: 20 mL/min — ABNORMAL LOW (ref >90)
GFR calc non Af Amer: 17 mL/min — ABNORMAL LOW (ref >90)

## 2012-03-02 LAB — GLUCOSE, CAPILLARY: Glucose-Capillary: 120 mg/dL — ABNORMAL HIGH (ref 70–99)

## 2012-03-02 LAB — SODIUM, URINE, RANDOM: Sodium, Ur: 26 mEq/L

## 2012-03-02 MED ORDER — SODIUM CHLORIDE 0.9 % IJ SOLN
10.0000 mL | INTRAMUSCULAR | Status: DC | PRN
  Administered 2012-03-12: 10 mL

## 2012-03-02 MED ORDER — OSELTAMIVIR PHOSPHATE 6 MG/ML PO SUSR
ORAL | Status: AC
  Filled 2012-03-02: qty 1

## 2012-03-02 MED ORDER — METOPROLOL TARTRATE 1 MG/ML IV SOLN
2.5000 mg | Freq: Four times a day (QID) | INTRAVENOUS | Status: DC
  Administered 2012-03-02 – 2012-03-10 (×28): 2.5 mg via INTRAVENOUS
  Filled 2012-03-02 (×29): qty 5

## 2012-03-02 MED ORDER — SODIUM CHLORIDE 0.9 % IJ SOLN
10.0000 mL | Freq: Two times a day (BID) | INTRAMUSCULAR | Status: DC
  Administered 2012-03-02 – 2012-03-06 (×5): 10 mL
  Administered 2012-03-06: 20 mL

## 2012-03-02 MED ORDER — SODIUM CHLORIDE 0.9 % IV SOLN
INTRAVENOUS | Status: DC
  Administered 2012-03-02: 18:00:00 via INTRAVENOUS
  Administered 2012-03-03: 1000 mL via INTRAVENOUS
  Administered 2012-03-03 – 2012-03-05 (×5): via INTRAVENOUS
  Administered 2012-03-05 – 2012-03-06 (×2): 1000 mL via INTRAVENOUS
  Administered 2012-03-07 – 2012-03-08 (×3): via INTRAVENOUS

## 2012-03-02 MED ORDER — OSELTAMIVIR PHOSPHATE 6 MG/ML PO SUSR
75.0000 mg | Freq: Two times a day (BID) | ORAL | Status: AC
  Administered 2012-03-02 – 2012-03-06 (×10): 75 mg
  Filled 2012-03-02 (×12): qty 12.5

## 2012-03-02 MED ORDER — FUROSEMIDE 10 MG/ML IJ SOLN
40.0000 mg | Freq: Two times a day (BID) | INTRAMUSCULAR | Status: DC
  Administered 2012-03-02: 40 mg via INTRAVENOUS
  Filled 2012-03-02 (×2): qty 4

## 2012-03-02 NOTE — Consult Note (Signed)
RAYMIR FROMMELT MRN: [id number] DOB/AGE: [birth year] 56 y.o. Primary Care Physician:PATTERSON, KATHY, FNP Admit date: 02/29/2012 Chief Complaint:  Chief Complaint  Patient presents with  . Respiratory Distress   HPI:  Pt is 56 year old male  With past medical history of COPD who was brought to ER with c/o of respiratory distress. HPI dates back to Saturday when EMS was called  to his house for shortness of breath-EMS gave him nebulizers which helped and he refused to come to the hospital . Later on yesterday  his wife could not get him by phone while she was at work and EMS responded to find him severely dyspneic, wheezing, hypoxic.EMS brought him to ER where  patient was intubated upon arrival .  Pt is currently intubated and sedated.   Past Medical History  Diagnosis Date  . Hypertension   . Dyspnea   . Chronic kidney disease, stage 2, mildly decreased GFR     Creatinine of 1.31 in 04/2011  . COPD (chronic obstructive pulmonary disease)     moderate by spirometry in 08/2009;FEV1 of 1.1 ;nl alpha -1 antitrypsin   . Obesity   . Psoriasis   . Left eye trauma     with loss of vision  . Erectile dysfunction   . Lumbosacral spinal stenosis     chronic low back pain  . Headache   . Sleep apnea   . Carotid artery aneurysm         Family History  Problem Relation Age of Onset  . Heart disease    . Arthritis    . Lung disease    . Cancer    . Asthma      Social History:  reports that he quit smoking about 20 months ago. He has never used smokeless tobacco. He reports that he drinks about .5 ounces of alcohol per week. He reports that he does not use illicit drugs.   Allergies:  Allergies  Allergen Reactions  . Bee Venom Anaphylaxis    Patient has an epi pen    Medications Prior to Admission  Medication Sig Dispense Refill  . albuterol (VENTOLIN HFA) 108 (90 BASE) MCG/ACT inhaler Inhale 2 puffs into the lungs every 4 (four) hours as needed. For shortness of breath       . cetirizine (ZYRTEC) 10 MG tablet Take 1 tablet (10 mg total) by mouth daily.      . cloNIDine (CATAPRES) 0.2 MG tablet Take 0.2 mg by mouth 2 (two) times daily.      . Fluticasone-Salmeterol (ADVAIR DISKUS) 250-50 MCG/DOSE AEPB Inhale 1 puff into the lungs every 12 (twelve) hours.        Marland Kitchen HYDROcodone-acetaminophen (NORCO) 5-325 MG per tablet Take 1 tablet by mouth every 6 (six) hours as needed for pain.  60 tablet  5  . ipratropium-albuterol (DUONEB) 0.5-2.5 (3) MG/3ML SOLN Take 3 mLs by nebulization 2 (two) times daily as needed. For shortness of breath      . lisinopril (PRINIVIL,ZESTRIL) 40 MG tablet Take 40 mg by mouth daily.      . methotrexate (RHEUMATREX) 2.5 MG tablet Take 10 mg by mouth once a week.      . predniSONE (DELTASONE) 10 MG tablet Take 10 mg by mouth daily.      . ranitidine (ZANTAC) 300 MG tablet Take 300 mg by mouth at bedtime.      . Vitamin D, Ergocalciferol, (DRISDOL) 50000 UNITS CAPS Take 50,000 Units by mouth 2 (two) times a  week.           ZOX:WRUEAV to get as pt is intubated     . antiseptic oral rinse  15 mL Mouth Rinse QID  . azithromycin  500 mg Intravenous Q24H  . cefTRIAXone (ROCEPHIN)  IV  1 g Intravenous Q24H  . chlorhexidine  15 mL Mouth/Throat BID  . cloNIDine  0.2 mg Oral BID  . enoxaparin (LOVENOX) injection  40 mg Subcutaneous Q24H  . folic acid  1 mg Intravenous Daily  . ipratropium  0.5 mg Nebulization Q4H  . levalbuterol  0.63 mg Nebulization Q4H  . methylPREDNISolone (SOLU-MEDROL) injection  125 mg Intravenous Q6H  . metoprolol  2.5 mg Intravenous Q6H  . oseltamivir  75 mg Per Tube BID  . pantoprazole (PROTONIX) IV  40 mg Intravenous QHS  . sodium chloride  10-40 mL Intracatheter Q12H  . thiamine  100 mg Intravenous Daily        Physical Exam: Vital signs in last 24 hours: Temp:  [97.9 F (36.6 C)-99.7 F (37.6 C)] 97.9 F (36.6 C) (12/23 1213) Pulse Rate:  [101-148] 104  (12/23 1230) Resp:  [11-32] 20  (12/23  1230) BP: (72-178)/(44-115) 72/46 mmHg (12/23 1230) SpO2:  [91 %-100 %] 97 % (12/23 1230) FiO2 (%):  [39.6 %-99.6 %] 40.1 % (12/23 1230) Weight change:  Last BM Date: 02/29/12  Intake/Output from previous day: 12/22 0701 - 12/23 0700 In: 1210.9 [I.V.:910.9; IV Piggyback:300] Out: 1225 [Urine:1225] Total I/O In: 113.8 [I.V.:113.8] Out: 60 [Emesis/NG output:60]   Physical Exam: General- pt is intubated, sedated Resp-b/l  Rhonchi, ET tube in situ CVS- S1S2 regular in rate and rhythm GIT- BS+, soft, NT, ND EXT- NO LE Edema, Cyanosis     Lab Results: CBC  Basename 03/02/12 1104 03/02/12 0427  WBC 8.3 9.6  HGB 12.2* 13.4  HCT 37.0* 41.6  PLT 196 235    BMET  Basename 03/02/12 0427 03/01/12 0512  NA 138 138  K 4.5 4.0  CL 99 104  CO2 23 24  GLUCOSE 144* 136*  BUN 28* 17  CREATININE 3.67* 2.06*  CALCIUM 8.5 7.6*    Creat trend 2013  1.3==>2.1==>3.67 2012  1.2--1.6 2011  1.3--1.5 2010  1.3--1.4 2009  1.2--1.4    Results for AMERICO, VALLERY (MRN [id number]) as of 03/02/2012 15:33  Ref. Range 03/02/2012 05:39  pH, Arterial Latest Range: 7.350-7.450  7.227 (L)  pCO2 arterial Latest Range: 35.0-45.0 mmHg 56.8 (H)  pO2, Arterial Latest Range: 80.0-100.0 mmHg 94.6  Bicarbonate Latest Range: 20.0-24.0 mEq/L 22.8  TCO2 Latest Range: 0-100 mmol/L 21.2     MICRO Recent Results (from the past 240 hour(s))  CULTURE, BLOOD (ROUTINE X 2)     Status: Normal (Preliminary result)   Collection Time   02/29/12 11:47 PM      Component Value Range Status Comment   Specimen Description BLOOD LEFT HAND   Final    Special Requests     Final    Value: BOTTLES DRAWN AEROBIC AND ANAEROBIC AEB 6CC ANA 4CC   Culture NO GROWTH 1 DAY   Final    Report Status PENDING   Incomplete   MRSA PCR SCREENING     Status: Abnormal   Collection Time   03/01/12  4:31 AM      Component Value Range Status Comment   MRSA by PCR INVALID RESULTS, SPECIMEN SENT FOR CULTURE (*) NEGATIVE  Final   MRSA CULTURE     Status: Normal (Preliminary result)  Collection Time   03/01/12  4:31 AM      Component Value Range Status Comment   Specimen Description NOSE   Final    Special Requests NOSE   Final    Culture NO SUSPICIOUS COLONIES, CONTINUING TO HOLD   Final    Report Status PENDING   Incomplete   CULTURE, BLOOD (ROUTINE X 2)     Status: Normal (Preliminary result)   Collection Time   03/01/12  5:12 AM      Component Value Range Status Comment   Specimen Description BLOOD LEFT HAND   Final    Special Requests     Final    Value: BOTTLES DRAWN AEROBIC AND ANAEROBIC AEB 5CC ANA 6CC   Culture NO GROWTH 1 DAY   Final    Report Status PENDING   Incomplete       Lab Results  Component Value Date   CALCIUM 8.5 03/02/2012   PHOS 1.9* 05/13/2011    Results for SABIEN, UMLAND (MRN [id number]) as of 03/02/2012 14:50  Ref. Range 03/01/2012 21:50  Influenza A By PCR Latest Range: NEGATIVE  POSITIVE (A)  Influenza B By PCR Latest Range: NEGATIVE  NEGATIVE  H1N1 flu by pcr Latest Range: NOT DETECTED  DETECTED (A)    Results for TAMON, PARKERSON (MRN [id number]) as of 03/02/2012 15:33  Ref. Range 03/01/2012 00:19  COCAINE Latest Range: NONE DETECTED  POSITIVE (A)     Impression: 1)Renal  AKi on CKD AKI secondary to Prerenal / ATN AKI secondary to sepsis + Hypotension + ACE ( as outpt) AKi worsening CKD stage 3 . CKD since 2009  CKD secondary to HTN    2)HTN  BP low  Medication- On Central Acting Sympatholytics- Clonidine   3)Anemia HGb at goal (9--11)   4)ID- admitted with Influenza On tamiflu  5)Resp- Admitted with resp failure intubated  6)FEN  Normokalemic NOrmonatremic   7)Acid base Resp acidosis- intubated  9) Substance abuse- Cocaine +, Hx of ETOH and Tobacco as well    Plan:  Agree with stopping ace Will d/c clonidine as BP low Will ask for FENA Will ask for renal U/s Will start on IVF Will start on lasix Will follow  bmet       Krisalyn Yankowski S 03/02/2012, 2:45 PM

## 2012-03-02 NOTE — Progress Notes (Signed)
H1N1 positive  On droplet precautions; we will initiate Tamiflu.

## 2012-03-02 NOTE — Progress Notes (Signed)
Dr. Delford Field notified about pt's left ureactive pupil, history reviewed  and pt assessed. Pt has history of trauma and surgery to the left eye. Pt moves all extremes per command. Continue to monitor pt.

## 2012-03-02 NOTE — Progress Notes (Signed)
INITIAL NUTRITION ASSESSMENT  DOCUMENTATION CODES Per approved criteria  -Obesity Unspecified   INTERVENTION:  If pt unable to wean and advance diet recommend: Initiate Osmolite 1.2  @ 20 ml/hr via OG tube and increase by 10 ml every 4 hours to goal rate of 40 ml/hr. 60 ml Prostat TID.  At goal rate, tube feeding regimen will provide 1752 kcal, 156 grams of protein, and 787 ml of H2O.    NUTRITION DIAGNOSIS: Inadequate oral intake related to inability to eat as evidenced by NPO status.   Goal: Enteral nutrition to provide 60-70% of estimated calorie needs (22-25 kcals/kg ideal body weight) and >/= 90% of estimated protein needs, based on ASPEN guidelines for permissive underfeeding in critically ill obese individuals.   Monitor:  Monitor nutrition support measures  Reason for Assessment: Vent status  56 y.o. male  Admitting Dx: Respiratory failure, acute  ASSESSMENT: Patient is currently intubated on ventilator support.  MV: 10 ml Temp:Temp (24hrs), Avg:98.7 F (37.1 C), Min:97.5 F (36.4 C), Max:99.7 F (37.6 C)  Propofol:12.9 ml/hr; caloric load 340 kcal lipids  Height: Ht Readings from Last 1 Encounters:  03/02/12 6' (1.829 m)    Weight: Wt Readings from Last 1 Encounters:  03/01/12 236 lb 15.9 oz (107.5 kg)    Ideal Body Weight: 178# (80.9kg)  % Ideal Body Weight: 133%  Wt Readings from Last 10 Encounters:  03/01/12 236 lb 15.9 oz (107.5 kg)  12/06/11 240 lb (108.863 kg)  11/20/11 227 lb (102.967 kg)  09/25/11 227 lb (102.967 kg)  06/26/11 223 lb (101.152 kg)  05/16/11 223 lb 8.7 oz (101.4 kg)  03/26/11 220 lb (99.791 kg)  03/16/10 214 lb (97.07 kg)    BMI:  Body mass index is 32.14 kg/(m^2). Obesity Class I  Estimated Nutritional Needs: Kcal: 2041 kcal Protein: 162 grams Fluid: 1 ml/kcal  Skin: no issues noted  Diet Order: NPO  EDUCATION NEEDS: -No education needs identified at this time   Intake/Output Summary (Last 24 hours) at  03/02/12 1707 Last data filed at 03/02/12 1600  Gross per 24 hour  Intake  898.9 ml  Output    510 ml  Net  388.9 ml    Last BM: 02/29/12  Labs:   Lab 03/02/12 0427 03/01/12 0512 02/29/12 2347  NA 138 138 135  K 4.5 4.0 3.5  CL 99 104 97  CO2 23 24 25   BUN 28* 17 16  CREATININE 3.67* 2.06* 2.12*  CALCIUM 8.5 7.6* 8.1*  MG -- -- --  PHOS -- -- --  GLUCOSE 144* 136* 283*    CBG (last 3)   Basename 03/02/12 1622 03/02/12 1131 03/02/12 0742  GLUCAP 120* 127* 162*    Scheduled Meds:   . antiseptic oral rinse  15 mL Mouth Rinse QID  . azithromycin  500 mg Intravenous Q24H  . cefTRIAXone (ROCEPHIN)  IV  1 g Intravenous Q24H  . chlorhexidine  15 mL Mouth/Throat BID  . enoxaparin (LOVENOX) injection  40 mg Subcutaneous Q24H  . folic acid  1 mg Intravenous Daily  . furosemide  40 mg Intravenous BID  . ipratropium  0.5 mg Nebulization Q4H  . levalbuterol  0.63 mg Nebulization Q4H  . methylPREDNISolone (SOLU-MEDROL) injection  125 mg Intravenous Q6H  . metoprolol  2.5 mg Intravenous Q6H  . oseltamivir  75 mg Per Tube BID  . pantoprazole (PROTONIX) IV  40 mg Intravenous QHS  . sodium chloride  10-40 mL Intracatheter Q12H  . thiamine  100  mg Intravenous Daily    Continuous Infusions:   . sodium chloride 20 mL/hr (03/01/12 0500)  . sodium chloride 125 mL/hr at 03/02/12 1600  . fentaNYL infusion INTRAVENOUS 25 mcg/hr (03/02/12 1600)  . propofol 20 mcg/kg/min (03/02/12 1600)    Past Medical History  Diagnosis Date  . Hypertension   . Dyspnea   . Chronic kidney disease, stage 2, mildly decreased GFR     Creatinine of 1.31 in 04/2011  . COPD (chronic obstructive pulmonary disease)     moderate by spirometry in 08/2009;FEV1 of 1.1 ;nl alpha -1 antitrypsin   . Obesity   . Psoriasis   . Left eye trauma     with loss of vision  . Erectile dysfunction   . Lumbosacral spinal stenosis     chronic low back pain  . Headache   . Sleep apnea   . Carotid artery aneurysm      Past Surgical History  Procedure Date  . Orif patella 12/14/2005    left fracture Dr.Harrison   . Ophthalmologic surgery 1963    secondary to left eye trauma    [phone]

## 2012-03-02 NOTE — Care Management Note (Signed)
    Page 1 of 1   03/12/2012     9:25:12 AM   CARE MANAGEMENT NOTE 03/12/2012  Patient:  [name], [name]   Account Number:  [id number]  Date Initiated:  03/02/2012  Documentation initiated by:  Sharrie Rothman  Subjective/Objective Assessment:   Pt admitted from home with respiratory failure. Pt currently on ventilator. Pt does have a wife.     Action/Plan:   CM will continue to follow once pt is off ventilator and able to talk. Will attempt to discuss home needs with wife.   Anticipated DC Date:  03/07/2012   Anticipated DC Plan:  HOME/SELF CARE  In-house referral  Clinical Social Worker      DC Planning Services  CM consult      Choice offered to / List presented to:             Status of service:  Completed, signed off Medicare Important Message given?  YES (If response is "NO", the following Medicare IM given date fields will be blank) Date Medicare IM given:  03/12/2012 Date Additional Medicare IM given:    Discharge Disposition:  SKILLED NURSING FACILITY  Per UR Regulation:    If discussed at Long Length of Stay Meetings, dates discussed:   03/10/2012    Comments:  03/12/12 0925 Arlyss Queen, RN BSN CM Pt discharged to [facility] in [place]. CSW will arrange discharge to facility.  03/09/12 1430 Arlyss Queen, RN BSN CM Pt off ventilator and moved to telemetry. PT consult ordered. Will continue to follow for [facility] needs. Pt stated that he does have O2 and neb machine for home use.  03/02/12 1445 Arlyss Queen, RN BSN CM

## 2012-03-02 NOTE — Progress Notes (Signed)
Subjective: He was admitted with respiratory failure. He is positive for H1 N 1 influenza. He is on Tamiflu. He remains intubated on the ventilator. He is responsive.  Objective: Vital signs in last 24 hours: Temp:  [98.2 F (36.8 C)-99.7 F (37.6 C)] 98.2 F (36.8 C) (12/23 0753) Pulse Rate:  [108-140] 127  (12/23 0645) Resp:  [17-35] 20  (12/23 0645) BP: (89-187)/(54-127) 120/77 mmHg (12/23 0630) SpO2:  [91 %-100 %] 99 % (12/23 0730) FiO2 (%):  [39.6 %-99.6 %] 40.2 % (12/23 0802) Weight change:  Last BM Date: 02/29/12  Intake/Output from previous day: 12/22 0701 - 12/23 0700 In: 1209 [I.V.:909; IV Piggyback:300] Out: 1225 [Urine:1225]  PHYSICAL EXAM General appearance: moderate distress Resp: rhonchi bilaterally Cardio: regular rate and rhythm, S1, S2 normal, no murmur, click, rub or gallop GI: soft, non-tender; bowel sounds normal; no masses,  no organomegaly Extremities: extremities normal, atraumatic, no cyanosis or edema  Lab Results:    Basic Metabolic Panel:  Basename 03/02/12 0427 03/01/12 0512  NA 138 138  K 4.5 4.0  CL 99 104  CO2 23 24  GLUCOSE 144* 136*  BUN 28* 17  CREATININE 3.67* 2.06*  CALCIUM 8.5 7.6*  MG -- --  PHOS -- --   Liver Function Tests:  [facility] 02/29/12 2347  AST 51*  ALT 36  ALKPHOS 64  BILITOT 0.3  PROT 6.8  ALBUMIN 3.4*   No results found for this basename: LIPASE:2,AMYLASE:2 in the last 72 hours No results found for this basename: AMMONIA:2 in the last 72 hours CBC:  Basename 03/02/12 0427 03/01/12 0512 02/29/12 2347  WBC 9.6 8.6 --  NEUTROABS 8.7* -- 7.5  HGB 13.4 13.1 --  HCT 41.6 40.0 --  MCV 95.0 92.8 --  PLT 235 218 --   Cardiac Enzymes:  Basename 03/01/12 1712 03/01/12 1014 03/01/12 0512  CKTOTAL -- -- --  CKMB -- -- --  CKMBINDEX -- -- --  TROPONINI <0.30 0.34* 0.32*   BNP:  Basename 02/29/12 2347  PROBNP 1408.0*   D-Dimer: No results found for this basename: DDIMER:2 in the last 72  hours CBG:  Basename 03/02/12 0742 03/01/12 2017 02/29/12 2353  GLUCAP 162* 137* 272*   Hemoglobin A1C: No results found for this basename: HGBA1C in the last 72 hours Fasting Lipid Panel: No results found for this basename: CHOL,HDL,LDLCALC,TRIG,CHOLHDL,LDLDIRECT in the last 72 hours Thyroid Function Tests: No results found for this basename: TSH,T4TOTAL,FREET4,T3FREE,THYROIDAB in the last 72 hours Anemia Panel: No results found for this basename: VITAMINB12,FOLATE,FERRITIN,TIBC,IRON,RETICCTPCT in the last 72 hours Coagulation:  Basename 02/29/12 2347  LABPROT 13.2  INR 1.01   Urine Drug Screen: Drugs of Abuse     Component Value Date/Time   LABOPIA NONE DETECTED 03/01/2012 0019   COCAINSCRNUR POSITIVE* 03/01/2012 0019   LABBENZ NONE DETECTED 03/01/2012 0019   AMPHETMU NONE DETECTED 03/01/2012 0019   THCU NONE DETECTED 03/01/2012 0019   LABBARB NONE DETECTED 03/01/2012 0019    Alcohol Level:  Basename 02/29/12 2347  ETH <11   Urinalysis:  Basename 02/29/12 2353  COLORURINE YELLOW  LABSPEC 1.025  PHURINE 7.0  GLUCOSEU 500*  HGBUR LARGE*  BILIRUBINUR NEGATIVE  KETONESUR NEGATIVE  PROTEINUR >300*  UROBILINOGEN 0.2  NITRITE NEGATIVE  LEUKOCYTESUR NEGATIVE   Misc. Labs:  ABGS  Basename 03/02/12 0539  PHART 7.227*  PO2ART 94.6  TCO2 21.2  HCO3 22.8   CULTURES Recent Results (from the past 240 hour(s))  CULTURE, BLOOD (ROUTINE X 2)     Status:  Normal (Preliminary result)   Collection Time   02/29/12 11:47 PM      Component Value Range Status Comment   Specimen Description BLOOD LEFT HAND   Final    Special Requests     Final    Value: BOTTLES DRAWN AEROBIC AND ANAEROBIC AEB 6CC ANA 4CC   Culture NO GROWTH <24 HRS   Final    Report Status PENDING   Incomplete   MRSA PCR SCREENING     Status: Abnormal   Collection Time   03/01/12  4:31 AM      Component Value Range Status Comment   MRSA by PCR INVALID RESULTS, SPECIMEN SENT FOR CULTURE (*) NEGATIVE  Final   CULTURE, BLOOD (ROUTINE X 2)     Status: Normal (Preliminary result)   Collection Time   03/01/12  5:12 AM      Component Value Range Status Comment   Specimen Description BLOOD LEFT HAND   Final    Special Requests     Final    Value: BOTTLES DRAWN AEROBIC AND ANAEROBIC AEB 5CC ANA 6CC   Culture NO GROWTH <24 HRS   Final    Report Status PENDING   Incomplete    Studies/Results: Portable Chest Xray In Am  03/02/2012  *RADIOLOGY REPORT*  Clinical Data: Ventilator protocol.  COPD.  PORTABLE CHEST - 1 VIEW  Comparison: 03/01/2012.  Findings:  The support apparatus is stable.  The heart and lungs are unchanged.  Underlying emphysematous changes with bibasilar scarring atelectasis.  No edema or pneumothorax.  IMPRESSION: 1.  Stable support apparatus. 2.  Stable appearance of the heart and lungs.   Original Report Authenticated By: Rudie Meyer, M.D.    Dg Chest Portable 1 View  03/01/2012  *RADIOLOGY REPORT*  Clinical Data: Respiratory distress  PORTABLE CHEST - 1 VIEW  Comparison: 12/06/2011  Findings: Endotracheal tube tip 4 cm proximal to the carina. Cardiomegaly is similar to prior.  Mediastinal prominence has increased. There is evidence of underlying aortic pathology with ectasia and tortuosity.  Mild increased perihilar/ interstitial markings superimposed on chronic peribronchial thickening.  No pleural effusion or pneumothorax.  No acute osseous finding.  IMPRESSION: Mediastinal prominence. If there is clinical concern for acute aortic pathology, CT can be obtained.  Endotracheal tube tip 4 cm proximal to the carina.  Mild increased perihilar markings may reflect a mild edema or bronchitis superimposed on chronic bronchitic change.  Discussed via telephone with Dr. Colon Branch at 01:20 am on 12/31/2011.   Original Report Authenticated By: Jearld Lesch, M.D.    Dg Chest Port 1v Same Day  03/01/2012  *RADIOLOGY REPORT*  Clinical Data: Hypoxia, intubated  PORTABLE CHEST - 1 VIEW SAME DAY   Comparison: 03/01/2012  Findings: Endotracheal tube is appropriately positioned. Nasogastric tube terminates below the level of the diaphragms but the tip is not included on the film. Lungs are slightly better aerated with improvement in presumed left lower lobe atelectasis. No new focal pulmonary opacity. Aorta is ectatic and unfolded. Heart size mildly enlarged.  IMPRESSION: Appropriate endotracheal tube position, improved aeration.   Original Report Authenticated By: Christiana Pellant, M.D.     Medications:  Prior to Admission:  Prescriptions prior to admission  Medication Sig Dispense Refill  . albuterol (VENTOLIN HFA) 108 (90 BASE) MCG/ACT inhaler Inhale 2 puffs into the lungs every 4 (four) hours as needed. For shortness of breath      . benazepril (LOTENSIN) 20 MG tablet Take 2 tablets (40 mg total) by  mouth at bedtime.      . cetirizine (ZYRTEC) 10 MG tablet Take 1 tablet (10 mg total) by mouth daily.      . Fluticasone-Salmeterol (ADVAIR DISKUS) 250-50 MCG/DOSE AEPB Inhale 1 puff into the lungs every 12 (twelve) hours.        . folic acid (FOLVITE) 1 MG tablet Take 1 mg by mouth daily.      Marland Kitchen HYDROcodone-acetaminophen (NORCO) 5-325 MG per tablet Take 1 tablet by mouth every 6 (six) hours as needed for pain.  60 tablet  5  . ipratropium-albuterol (DUONEB) 0.5-2.5 (3) MG/3ML SOLN Take 3 mLs by nebulization 2 (two) times daily as needed. For shortness of breath      . levofloxacin (LEVAQUIN) 500 MG tablet Take 1 tablet (500 mg total) by mouth daily.  3 tablet  0  . methotrexate 2.5 MG tablet Take 7.5 mg by mouth once a week. On Saturday      . nicotine (NICODERM CQ - DOSED IN MG/24 HOURS) 21 mg/24hr patch Place 1 patch onto the skin daily.      . predniSONE (DELTASONE) 10 MG tablet 4 tablets daily for 2 days then decrease by 1 tablet every 2 days until off.  20 tablet  0   Scheduled:   . antiseptic oral rinse  15 mL Mouth Rinse QID  . azithromycin  500 mg Intravenous Q24H  . cefTRIAXone  (ROCEPHIN)  IV  1 g Intravenous Q24H  . chlorhexidine  15 mL Mouth/Throat BID  . cloNIDine  0.2 mg Oral BID  . enoxaparin (LOVENOX) injection  40 mg Subcutaneous Q24H  . etomidate      . folic acid  1 mg Intravenous Daily  . ipratropium  0.5 mg Nebulization Q4H  . levalbuterol  0.63 mg Nebulization Q4H  . lidocaine (cardiac) 100 mg/36ml      . methylPREDNISolone (SOLU-MEDROL) injection  125 mg Intravenous Q6H  . oseltamivir  75 mg Per Tube BID  . pantoprazole (PROTONIX) IV  40 mg Intravenous QHS  . rocuronium      . succinylcholine      . thiamine  100 mg Intravenous Daily   Continuous:   . sodium chloride 20 mL/hr (03/01/12 0500)  . fentaNYL infusion INTRAVENOUS 10 mcg/hr (03/02/12 0645)  . propofol 10 mcg/kg/min (03/02/12 0645)   WUJ:WJXBJY chloride, fentaNYL, levalbuterol, LORazepam  Assesment: He has COPD. We are attempting to wean him. He has sinus tachycardia and despite his COPD I will put him on low-dose intravenous metoprolol. He does have influenza as well his renal function is much worse and I'm going to ask for a nephrology consult Principal Problem:  *Respiratory failure, acute Active Problems:  CHRONIC OBSTRUCTIVE PULMONARY DISEASE  Chronic kidney disease, stage 2, mildly decreased GFR  COPD exacerbation  Sinus tachycardia  Substance abuse    Plan: Continue with current treatments add metoprolol ask for consult with nephrology. I'm not sure he is ready to come off the ventilator    LOS: 2 days   Verland Sprinkle L 03/02/2012, 8:14 AM

## 2012-03-02 NOTE — Progress Notes (Signed)
*  PRELIMINARY RESULTS* Echocardiogram 2D Echocardiogram has been performed.  [name] 03/02/2012, 4:19 PM

## 2012-03-03 ENCOUNTER — Inpatient Hospital Stay (HOSPITAL_COMMUNITY): Payer: Medicare Other

## 2012-03-03 LAB — GLUCOSE, CAPILLARY
Glucose-Capillary: 159 mg/dL — ABNORMAL HIGH (ref 70–99)
Glucose-Capillary: 120 mg/dL — ABNORMAL HIGH (ref 70–99)
Glucose-Capillary: 111 mg/dL — ABNORMAL HIGH (ref 70–99)

## 2012-03-03 LAB — MRSA CULTURE

## 2012-03-03 LAB — BASIC METABOLIC PANEL
BUN: 53 mg/dL — ABNORMAL HIGH (ref 6–23)
Calcium: 8.1 mg/dL — ABNORMAL LOW (ref 8.4–10.5)
GFR calc non Af Amer: 10 mL/min — ABNORMAL LOW (ref >90)
Glucose, Bld: 130 mg/dL — ABNORMAL HIGH (ref 70–99)
Sodium: 134 mEq/L — ABNORMAL LOW (ref 135–145)

## 2012-03-03 MED ORDER — ENOXAPARIN SODIUM 30 MG/0.3ML ~~LOC~~ SOLN
30.0000 mg | SUBCUTANEOUS | Status: DC
  Administered 2012-03-04 – 2012-03-10 (×7): 30 mg via SUBCUTANEOUS
  Filled 2012-03-03 (×7): qty 0.3

## 2012-03-03 MED ORDER — VANCOMYCIN HCL 10 G IV SOLR
2000.0000 mg | Freq: Once | INTRAVENOUS | Status: AC
  Administered 2012-03-03: 2000 mg via INTRAVENOUS
  Filled 2012-03-03 (×2): qty 2000

## 2012-03-03 MED ORDER — FUROSEMIDE 10 MG/ML IJ SOLN
120.0000 mg | Freq: Two times a day (BID) | INTRAVENOUS | Status: DC
  Administered 2012-03-03 – 2012-03-09 (×13): 120 mg via INTRAVENOUS
  Filled 2012-03-03 (×15): qty 12

## 2012-03-03 NOTE — Progress Notes (Signed)
ABG RESULTS NOT CROSSING OVER RESULTS ARE:7.200,CO2 57.4,P02 81.3,SAT 95.1,HCO3 21.6

## 2012-03-03 NOTE — Progress Notes (Signed)
ANTIBIOTIC CONSULT NOTE - INITIAL  Pharmacy Consult for Vancomycin Indication: pneumonia  Allergies  Allergen Reactions  . Bee Venom Anaphylaxis    Patient has an epi pen   Patient Measurements: Height: 6' (182.9 cm) Weight: 236 lb 15.9 oz (107.5 kg) IBW/kg (Calculated) : 77.6   Vital Signs: Temp: 97.6 F (36.4 C) (12/24 0522) Temp src: Axillary (12/24 0522) BP: 113/80 mmHg (12/24 0730) Pulse Rate: 103  (12/24 0730) Intake/Output from previous day: 12/23 0701 - 12/24 0700 In: 2200.1 [I.V.:2196.1; IV Piggyback:4] Out: 810 [Urine:750; Emesis/NG output:60] Intake/Output from this shift: Total I/O In: -  Out: 50 [Urine:50]  Labs:  [facility] 03/03/12 0249 03/02/12 1628 03/02/12 1104 03/02/12 0427 03/01/12 0512  WBC -- -- 8.3 9.6 8.6  HGB -- -- 12.2* 13.4 13.1  PLT -- -- 196 235 218  LABCREA -- 180.61 -- -- --  CREATININE 5.87* -- -- 3.67* 2.06*   Estimated Creatinine Clearance: 17.6 ml/min (by C-G formula based on Cr of 5.87). No results found for this basename: VANCOTROUGH:2,VANCOPEAK:2,VANCORANDOM:2,GENTTROUGH:2,GENTPEAK:2,GENTRANDOM:2,TOBRATROUGH:2,TOBRAPEAK:2,TOBRARND:2,AMIKACINPEAK:2,AMIKACINTROU:2,AMIKACIN:2, in the last 72 hours   Microbiology: Recent Results (from the past 720 hour(s))  CULTURE, BLOOD (ROUTINE X 2)     Status: Normal (Preliminary result)   Collection Time   02/29/12 11:47 PM      Component Value Range Status Comment   Specimen Description BLOOD LEFT HAND   Final    Special Requests     Final    Value: BOTTLES DRAWN AEROBIC AND ANAEROBIC AEB 6CC ANA 4CC   Culture NO GROWTH 1 DAY   Final    Report Status PENDING   Incomplete   MRSA PCR SCREENING     Status: Abnormal   Collection Time   03/01/12  4:31 AM      Component Value Range Status Comment   MRSA by PCR INVALID RESULTS, SPECIMEN SENT FOR CULTURE (*) NEGATIVE Final   MRSA CULTURE     Status: Normal   Collection Time   03/01/12  4:31 AM      Component Value Range Status Comment   Specimen Description NOSE   Final    Special Requests NOSE   Final    Culture NOMRSA   Final    Report Status 03/03/2012 FINAL   Final   CULTURE, BLOOD (ROUTINE X 2)     Status: Normal (Preliminary result)   Collection Time   03/01/12  5:12 AM      Component Value Range Status Comment   Specimen Description BLOOD LEFT HAND   Final    Special Requests     Final    Value: BOTTLES DRAWN AEROBIC AND ANAEROBIC AEB 5CC ANA 6CC   Culture NO GROWTH 1 DAY   Final    Report Status PENDING   Incomplete     Medical History: Past Medical History  Diagnosis Date  . Hypertension   . Dyspnea   . Chronic kidney disease, stage 2, mildly decreased GFR     Creatinine of 1.31 in 04/2011  . COPD (chronic obstructive pulmonary disease)     moderate by spirometry in 08/2009;FEV1 of 1.1 ;nl alpha -1 antitrypsin   . Obesity   . Psoriasis   . Left eye trauma     with loss of vision  . Erectile dysfunction   . Lumbosacral spinal stenosis     chronic low back pain  . Headache   . Sleep apnea   . Carotid artery aneurysm     Medications:  Scheduled:    .  antiseptic oral rinse  15 mL Mouth Rinse QID  . azithromycin  500 mg Intravenous Q24H  . cefTRIAXone (ROCEPHIN)  IV  1 g Intravenous Q24H  . chlorhexidine  15 mL Mouth/Throat BID  . enoxaparin (LOVENOX) injection  40 mg Subcutaneous Q24H  . [EXPIRED] etomidate      . folic acid  1 mg Intravenous Daily  . furosemide  120 mg Intravenous BID  . ipratropium  0.5 mg Nebulization Q4H  . levalbuterol  0.63 mg Nebulization Q4H  . [EXPIRED] lidocaine (cardiac) 100 mg/44ml      . methylPREDNISolone (SOLU-MEDROL) injection  125 mg Intravenous Q6H  . metoprolol  2.5 mg Intravenous Q6H  . oseltamivir  75 mg Per Tube BID  . pantoprazole (PROTONIX) IV  40 mg Intravenous QHS  . [EXPIRED] rocuronium      . sodium chloride  10-40 mL Intracatheter Q12H  . [EXPIRED] succinylcholine      . thiamine  100 mg Intravenous Daily  . vancomycin  2,000 mg Intravenous  Once  . [DISCONTINUED] cloNIDine  0.2 mg Oral BID  . [DISCONTINUED] furosemide  40 mg Intravenous BID   Assessment: 56yo morbidly obese male with poor renal fxn.  Intubated in ICU with respiratory failure.  Estimated Creatinine Clearance: 17.6 ml/min (by C-G formula based on Cr of 5.87).  Goal of Therapy:  Vancomycin trough level 15-20 mcg/ml  Plan: Vancomycin 2 grams today x 1 dose then 1500mg  IV q48hrs Check trough at steady state Monitor labs, renal fxn, and cultures per protocol Duration of therapy per MD  Valrie Hart A 03/03/2012,8:37 AM

## 2012-03-03 NOTE — Progress Notes (Signed)
Subjective: He is overall about the same. He had an episode of worsening difficulty with his respirations etc. last night but his blood gas is approximately the same as it was yesterday. He is still acidotic. His heart rate is better. When he is well sedated he seems to do pretty well as far as his heart rate blood pressure and his respiratory situation but seems to run into problems of his sedation sleeps a little bit. His renal function is worse  Objective: Vital signs in last 24 hours: Temp:  [97.5 F (36.4 C)-98.6 F (37 C)] 97.6 F (36.4 C) (12/24 0522) Pulse Rate:  [95-148] 103  (12/24 0730) Resp:  [9-34] 20  (12/24 0730) BP: (72-177)/(41-109) 113/80 mmHg (12/24 0730) SpO2:  [94 %-100 %] 99 % (12/24 0730) FiO2 (%):  [39.7 %-43.5 %] 40 % (12/24 0730) Weight:  [107.5 kg (236 lb 15.9 oz)] 107.5 kg (236 lb 15.9 oz) (12/24 0621) Weight change:  Last BM Date: 02/29/12  Intake/Output from previous day: 12/23 0701 - 12/24 0700 In: 2200.1 [I.V.:2196.1; IV Piggyback:4] Out: 810 [Urine:750; Emesis/NG output:60]  PHYSICAL EXAM General appearance: Intubated and sedated. During his wakeup assessment he does move his extremities and follow commands Resp: rhonchi bilaterally Cardio: regular rate and rhythm, S1, S2 normal, no murmur, click, rub or gallop GI: soft, non-tender; bowel sounds normal; no masses,  no organomegaly Extremities: He has some periorbital edema  Lab Results:    Basic Metabolic Panel:  Basename 03/03/12 0249 03/02/12 0427  NA 134* 138  K 4.3 4.5  CL 96 99  CO2 23 23  GLUCOSE 130* 144*  BUN 53* 28*  CREATININE 5.87* 3.67*  CALCIUM 8.1* 8.5  MG -- --  PHOS -- --   Liver Function Tests:  [facility] 02/29/12 2347  AST 51*  ALT 36  ALKPHOS 64  BILITOT 0.3  PROT 6.8  ALBUMIN 3.4*   No results found for this basename: LIPASE:2,AMYLASE:2 in the last 72 hours No results found for this basename: AMMONIA:2 in the last 72 hours CBC:  Basename 03/02/12 1104  03/02/12 0427  WBC 8.3 9.6  NEUTROABS 7.5 8.7*  HGB 12.2* 13.4  HCT 37.0* 41.6  MCV 93.7 95.0  PLT 196 235   Cardiac Enzymes:  Basename 03/01/12 1712 03/01/12 1014 03/01/12 0512  CKTOTAL -- -- --  CKMB -- -- --  CKMBINDEX -- -- --  TROPONINI <0.30 0.34* 0.32*   BNP:  Basename 02/29/12 2347  PROBNP 1408.0*   D-Dimer: No results found for this basename: DDIMER:2 in the last 72 hours CBG:  Basename 03/03/12 0731 03/03/12 0520 03/02/12 2326 03/02/12 1950 03/02/12 1622 03/02/12 1131  GLUCAP 159* 141* 138* 128* 120* 127*   Hemoglobin A1C: No results found for this basename: HGBA1C in the last 72 hours Fasting Lipid Panel: No results found for this basename: CHOL,HDL,LDLCALC,TRIG,CHOLHDL,LDLDIRECT in the last 72 hours Thyroid Function Tests: No results found for this basename: TSH,T4TOTAL,FREET4,T3FREE,THYROIDAB in the last 72 hours Anemia Panel: No results found for this basename: VITAMINB12,FOLATE,FERRITIN,TIBC,IRON,RETICCTPCT in the last 72 hours Coagulation:  Basename 02/29/12 2347  LABPROT 13.2  INR 1.01   Urine Drug Screen: Drugs of Abuse     Component Value Date/Time   LABOPIA NONE DETECTED 03/01/2012 0019   COCAINSCRNUR POSITIVE* 03/01/2012 0019   LABBENZ NONE DETECTED 03/01/2012 0019   AMPHETMU NONE DETECTED 03/01/2012 0019   THCU NONE DETECTED 03/01/2012 0019   LABBARB NONE DETECTED 03/01/2012 0019    Alcohol Level:  Basename 02/29/12 2347  ETH <  11   Urinalysis:  Basename 02/29/12 2353  COLORURINE YELLOW  LABSPEC 1.025  PHURINE 7.0  GLUCOSEU 500*  HGBUR LARGE*  BILIRUBINUR NEGATIVE  KETONESUR NEGATIVE  PROTEINUR >300*  UROBILINOGEN 0.2  NITRITE NEGATIVE  LEUKOCYTESUR NEGATIVE   Misc. Labs:  ABGS  Basename 03/03/12 0408  PHART CRITICAL RESULT CALLED TO, READ BACK BY AND VERIFIED WITH:  PO2ART 81.3  TCO2 20.3  HCO3 21.6   CULTURES Recent Results (from the past 240 hour(s))  CULTURE, BLOOD (ROUTINE X 2)     Status: Normal  (Preliminary result)   Collection Time   02/29/12 11:47 PM      Component Value Range Status Comment   Specimen Description BLOOD LEFT HAND   Final    Special Requests     Final    Value: BOTTLES DRAWN AEROBIC AND ANAEROBIC AEB 6CC ANA 4CC   Culture NO GROWTH 1 DAY   Final    Report Status PENDING   Incomplete   MRSA PCR SCREENING     Status: Abnormal   Collection Time   03/01/12  4:31 AM      Component Value Range Status Comment   MRSA by PCR INVALID RESULTS, SPECIMEN SENT FOR CULTURE (*) NEGATIVE Final   MRSA CULTURE     Status: Normal   Collection Time   03/01/12  4:31 AM      Component Value Range Status Comment   Specimen Description NOSE   Final    Special Requests NOSE   Final    Culture NOMRSA   Final    Report Status 03/03/2012 FINAL   Final   CULTURE, BLOOD (ROUTINE X 2)     Status: Normal (Preliminary result)   Collection Time   03/01/12  5:12 AM      Component Value Range Status Comment   Specimen Description BLOOD LEFT HAND   Final    Special Requests     Final    Value: BOTTLES DRAWN AEROBIC AND ANAEROBIC AEB 5CC ANA 6CC   Culture NO GROWTH 1 DAY   Final    Report Status PENDING   Incomplete    Studies/Results: US Renal  03/02/2012  *RADIOLOGY REPORT*  Clinical Data: Elevated BUN and creatinine  RENAL/URINARY TRACT ULTRASOUND COMPLETE  Comparison:  None.  Findings:  Right Kidney:  No hydronephrosis is seen.  The right kidney measures 11.4 cm sagittally.  The echogenicity of the renal parenchyma is increased consistent with chronic renal medical disease.  There is cortical thinning present and multiple renal cysts are noted the largest in the midportion of 3.7 x 3.2 x 2.9 cm.  Left Kidney:  No hydronephrosis is seen.  The left kidney measures 11.5 cm sagittally and also is echogenic consistent with chronic renal medical disease.  Multiple cysts are present the largest in the upper pole of 3.1 x 2.5 x 2.6 cm.  Bladder:  The urinary bladder is decompressed with Foley  catheter.  IMPRESSION:  1.  No hydronephrosis. 2.  Echogenic renal parenchyma consistent with chronic renal medical disease. 3.  Bilateral renal cysts.   Original Report Authenticated By: Dwyane Dee, M.D.    Dg Chest Port 1 View  03/03/2012  *RADIOLOGY REPORT*  Clinical Data: Worsening respiratory condition.  PORTABLE CHEST - 1 VIEW  Comparison: Chest radiograph performed 03/02/2012  Findings: The patient's endotracheal tube is seen ending approximately 5 cm above the carina.  A left PICC is noted ending likely about the proximal SVC.  An enteric tube is  noted extending below the diaphragm.  The lungs are well-aerated.  Mildly worsened right basilar airspace opacity is seen, and there is mild retrocardiac opacity, raising concern for mild pneumonia.  No definite pleural effusion or pneumothorax is seen.  The cardiomediastinal silhouette is within normal limits.  No acute osseous abnormalities are seen.  IMPRESSION: Mildly worsened right basilar airspace opacity, and mild retrocardiac opacity, raising concern for mild pneumonia, though atelectasis could have a similar appearance.   Original Report Authenticated By: Tonia Ghent, M.D.    Portable Chest Xray In Am  03/02/2012  *RADIOLOGY REPORT*  Clinical Data: Ventilator protocol.  COPD.  PORTABLE CHEST - 1 VIEW  Comparison: 03/01/2012.  Findings:  The support apparatus is stable.  The heart and lungs are unchanged.  Underlying emphysematous changes with bibasilar scarring atelectasis.  No edema or pneumothorax.  IMPRESSION: 1.  Stable support apparatus. 2.  Stable appearance of the heart and lungs.   Original Report Authenticated By: Rudie Meyer, M.D.    Dg Chest Port 1v Same Day  03/02/2012  *RADIOLOGY REPORT*  Clinical Data: PICC placement  PORTABLE CHEST - 1 VIEW SAME DAY  Comparison: 03/02/2012  Findings: Left arm PICC is in place the tip in the SVC.  NG tube in the stomach.  Endotracheal tube in good position and unchanged.  Underlying emphysema is  present.  Mild atelectasis in the lung bases.  IMPRESSION: Left PICC tip in the SVC.  No other changes.   Original Report Authenticated By: Janeece Riggers, M.D.    Dg Chest Port 1v Same Day  03/01/2012  *RADIOLOGY REPORT*  Clinical Data: Hypoxia, intubated  PORTABLE CHEST - 1 VIEW SAME DAY  Comparison: 03/01/2012  Findings: Endotracheal tube is appropriately positioned. Nasogastric tube terminates below the level of the diaphragms but the tip is not included on the film. Lungs are slightly better aerated with improvement in presumed left lower lobe atelectasis. No new focal pulmonary opacity. Aorta is ectatic and unfolded. Heart size mildly enlarged.  IMPRESSION: Appropriate endotracheal tube position, improved aeration.   Original Report Authenticated By: Christiana Pellant, M.D.     Medications:  Prior to Admission:  Prescriptions prior to admission  Medication Sig Dispense Refill  . albuterol (VENTOLIN HFA) 108 (90 BASE) MCG/ACT inhaler Inhale 2 puffs into the lungs every 4 (four) hours as needed. For shortness of breath      . cetirizine (ZYRTEC) 10 MG tablet Take 1 tablet (10 mg total) by mouth daily.      . cloNIDine (CATAPRES) 0.2 MG tablet Take 0.2 mg by mouth 2 (two) times daily.      . Fluticasone-Salmeterol (ADVAIR DISKUS) 250-50 MCG/DOSE AEPB Inhale 1 puff into the lungs every 12 (twelve) hours.        Marland Kitchen HYDROcodone-acetaminophen (NORCO) 5-325 MG per tablet Take 1 tablet by mouth every 6 (six) hours as needed for pain.  60 tablet  5  . ipratropium-albuterol (DUONEB) 0.5-2.5 (3) MG/3ML SOLN Take 3 mLs by nebulization 2 (two) times daily as needed. For shortness of breath      . lisinopril (PRINIVIL,ZESTRIL) 40 MG tablet Take 40 mg by mouth daily.      . methotrexate (RHEUMATREX) 2.5 MG tablet Take 10 mg by mouth once a week.      . predniSONE (DELTASONE) 10 MG tablet Take 10 mg by mouth daily.      . ranitidine (ZANTAC) 300 MG tablet Take 300 mg by mouth at bedtime.      . Vitamin D,  Ergocalciferol, (DRISDOL) 50000 UNITS CAPS Take 50,000 Units by mouth 2 (two) times a week.       Scheduled:   . antiseptic oral rinse  15 mL Mouth Rinse QID  . azithromycin  500 mg Intravenous Q24H  . cefTRIAXone (ROCEPHIN)  IV  1 g Intravenous Q24H  . chlorhexidine  15 mL Mouth/Throat BID  . enoxaparin (LOVENOX) injection  40 mg Subcutaneous Q24H  . folic acid  1 mg Intravenous Daily  . furosemide  120 mg Intravenous BID  . ipratropium  0.5 mg Nebulization Q4H  . levalbuterol  0.63 mg Nebulization Q4H  . methylPREDNISolone (SOLU-MEDROL) injection  125 mg Intravenous Q6H  . metoprolol  2.5 mg Intravenous Q6H  . oseltamivir  75 mg Per Tube BID  . pantoprazole (PROTONIX) IV  40 mg Intravenous QHS  . sodium chloride  10-40 mL Intracatheter Q12H  . thiamine  100 mg Intravenous Daily   Continuous:   . sodium chloride 20 mL/hr (03/01/12 0500)  . sodium chloride 125 mL/hr at 03/03/12 0154  . fentaNYL infusion INTRAVENOUS 50 mcg/hr (03/03/12 0700)  . propofol 10 mcg/kg/min (03/03/12 0730)   ZOX:WRUEAV chloride, fentaNYL, levalbuterol, LORazepam, sodium chloride  Assesment: He has acute respiratory failure. He has pneumonia. He has influenza and because of that I'm going to add vancomycin because staphylococcal is been a frequent organism in patients who have had influenza and to have pneumonia Principal Problem:  *Respiratory failure, acute Active Problems:  CHRONIC OBSTRUCTIVE PULMONARY DISEASE  Chronic kidney disease, stage 2, mildly decreased GFR  COPD exacerbation  Sinus tachycardia  Substance abuse  Acute on chronic renal failure  Influenza A (H1N1)    Plan: Add vancomycin    LOS: 3 days   Anne Boltz L 03/03/2012, 8:22 AM

## 2012-03-03 NOTE — Progress Notes (Signed)
UR Chart Review Completed  

## 2012-03-03 NOTE — Progress Notes (Signed)
Subjective: Interval History: none.  Objective: Vital signs in last 24 hours: Temp:  [97.5 F (36.4 C)-98.6 F (37 C)] 97.6 F (36.4 C) (12/24 0522) Pulse Rate:  [95-148] 103  (12/24 0730) Resp:  [9-34] 20  (12/24 0730) BP: (72-177)/(41-109) 113/80 mmHg (12/24 0730) SpO2:  [94 %-100 %] 99 % (12/24 0730) FiO2 (%):  [39.7 %-99.6 %] 40 % (12/24 0730) Weight:  [107.5 kg (236 lb 15.9 oz)] 107.5 kg (236 lb 15.9 oz) (12/24 1610) Weight change:   Intake/Output from previous day: 12/23 0701 - 12/24 0700 In: 854.1 [I.V.:850.1; IV Piggyback:4] Out: 810 [Urine:750; Emesis/NG output:60] Intake/Output this shift: Generally patient intubated and sedated. Presently patient with some puffiness of his face. Chest decreased breath sound bilaterally Heart regular rate and rhythm Abdomen soft nontender positive bowel sound  Extremities 1+ edema.  Lab Results:  [facility] 03/02/12 1104 03/02/12 0427  WBC 8.3 9.6  HGB 12.2* 13.4  HCT 37.0* 41.6  PLT 196 235   BMET:  Basename 03/03/12 0249 03/02/12 0427  NA 134* 138  K 4.3 4.5  CL 96 99  CO2 23 23  GLUCOSE 130* 144*  BUN 53* 28*  CREATININE 5.87* 3.67*  CALCIUM 8.1* 8.5   No results found for this basename: PTH:2 in the last 72 hours Iron Studies: No results found for this basename: IRON,TIBC,TRANSFERRIN,FERRITIN in the last 72 hours  Studies/Results: US Renal  03/02/2012  *RADIOLOGY REPORT*  Clinical Data: Elevated BUN and creatinine  RENAL/URINARY TRACT ULTRASOUND COMPLETE  Comparison:  None.  Findings:  Right Kidney:  No hydronephrosis is seen.  The right kidney measures 11.4 cm sagittally.  The echogenicity of the renal parenchyma is increased consistent with chronic renal medical disease.  There is cortical thinning present and multiple renal cysts are noted the largest in the midportion of 3.7 x 3.2 x 2.9 cm.  Left Kidney:  No hydronephrosis is seen.  The left kidney measures 11.5 cm sagittally and also is echogenic consistent with  chronic renal medical disease.  Multiple cysts are present the largest in the upper pole of 3.1 x 2.5 x 2.6 cm.  Bladder:  The urinary bladder is decompressed with Foley catheter.  IMPRESSION:  1.  No hydronephrosis. 2.  Echogenic renal parenchyma consistent with chronic renal medical disease. 3.  Bilateral renal cysts.   Original Report Authenticated By: Dwyane Dee, M.D.    Dg Chest Port 1 View  03/03/2012  *RADIOLOGY REPORT*  Clinical Data: Worsening respiratory condition.  PORTABLE CHEST - 1 VIEW  Comparison: Chest radiograph performed 03/02/2012  Findings: The patient's endotracheal tube is seen ending approximately 5 cm above the carina.  A left PICC is noted ending likely about the proximal SVC.  An enteric tube is noted extending below the diaphragm.  The lungs are well-aerated.  Mildly worsened right basilar airspace opacity is seen, and there is mild retrocardiac opacity, raising concern for mild pneumonia.  No definite pleural effusion or pneumothorax is seen.  The cardiomediastinal silhouette is within normal limits.  No acute osseous abnormalities are seen.  IMPRESSION: Mildly worsened right basilar airspace opacity, and mild retrocardiac opacity, raising concern for mild pneumonia, though atelectasis could have a similar appearance.   Original Report Authenticated By: Tonia Ghent, M.D.    Portable Chest Xray In Am  03/02/2012  *RADIOLOGY REPORT*  Clinical Data: Ventilator protocol.  COPD.  PORTABLE CHEST - 1 VIEW  Comparison: 03/01/2012.  Findings:  The support apparatus is stable.  The heart and lungs are unchanged.  Underlying emphysematous changes with bibasilar scarring atelectasis.  No edema or pneumothorax.  IMPRESSION: 1.  Stable support apparatus. 2.  Stable appearance of the heart and lungs.   Original Report Authenticated By: Rudie Meyer, M.D.    Dg Chest Port 1v Same Day  03/02/2012  *RADIOLOGY REPORT*  Clinical Data: PICC placement  PORTABLE CHEST - 1 VIEW SAME DAY   Comparison: 03/02/2012  Findings: Left arm PICC is in place the tip in the SVC.  NG tube in the stomach.  Endotracheal tube in good position and unchanged.  Underlying emphysema is present.  Mild atelectasis in the lung bases.  IMPRESSION: Left PICC tip in the SVC.  No other changes.   Original Report Authenticated By: Janeece Riggers, M.D.    Dg Chest Port 1v Same Day  03/01/2012  *RADIOLOGY REPORT*  Clinical Data: Hypoxia, intubated  PORTABLE CHEST - 1 VIEW SAME DAY  Comparison: 03/01/2012  Findings: Endotracheal tube is appropriately positioned. Nasogastric tube terminates below the level of the diaphragms but the tip is not included on the film. Lungs are slightly better aerated with improvement in presumed left lower lobe atelectasis. No new focal pulmonary opacity. Aorta is ectatic and unfolded. Heart size mildly enlarged.  IMPRESSION: Appropriate endotracheal tube position, improved aeration.   Original Report Authenticated By: Christiana Pellant, M.D.     I have reviewed the patient's current medications.  Assessment/Plan: Problem #1 acute kidney injury superimposed on chronic his BUN and creatinine is increasing. Presently patient is none oliguric. Ultrasound of his kidneys showed normal size kidneys with increased echogenicity. There is no hydro-. Problem #2 respiratory failure patient intubated and sedated. Problem #3 hypertension blood pressure seems to be improving Problem #4 COPD Problem #5 respiratory acidosis Problem #6 history of psoriatiasis Plan: Increase his Lasix to 120 mg IV twice a day We'll follow his basic metabolic panel in the morning.    LOS: 3 days   Randa Riss S 03/03/2012,7:52 AM

## 2012-03-03 NOTE — Progress Notes (Signed)
Patient has had all nebulizer treatments tonight, bbs are very tight with wheezes sats95% suction small amounts of white clear secretions.

## 2012-03-04 ENCOUNTER — Inpatient Hospital Stay (HOSPITAL_COMMUNITY): Payer: Medicare Other

## 2012-03-04 LAB — CBC
MCH: 30.9 pg (ref 26.0–34.0)
MCHC: 33.5 g/dL (ref 30.0–36.0)
MCV: 92.4 fL (ref 78.0–100.0)
Platelets: 234 10*3/uL (ref 150–400)
RDW: 14.6 % (ref 11.5–15.5)

## 2012-03-04 LAB — GLUCOSE, CAPILLARY
Glucose-Capillary: 116 mg/dL — ABNORMAL HIGH (ref 70–99)
Glucose-Capillary: 126 mg/dL — ABNORMAL HIGH (ref 70–99)
Glucose-Capillary: 143 mg/dL — ABNORMAL HIGH (ref 70–99)

## 2012-03-04 LAB — BLOOD GAS, ARTERIAL
Bicarbonate: 23.4 mEq/L (ref 20.0–24.0)
TCO2: 21.4 mmol/L (ref 0–100)
pCO2 arterial: 47.1 mmHg — ABNORMAL HIGH (ref 35.0–45.0)
pH, Arterial: 7.318 — ABNORMAL LOW (ref 7.350–7.450)

## 2012-03-04 LAB — BASIC METABOLIC PANEL
CO2: 23 mEq/L (ref 19–32)
Calcium: 7.7 mg/dL — ABNORMAL LOW (ref 8.4–10.5)
Creatinine, Ser: 6.2 mg/dL — ABNORMAL HIGH (ref 0.50–1.35)
Glucose, Bld: 141 mg/dL — ABNORMAL HIGH (ref 70–99)

## 2012-03-04 MED ORDER — ACETAMINOPHEN 500 MG PO TABS: 500.0000 mg | ORAL_TABLET | Freq: Four times a day (QID) | ORAL | Status: DC | PRN

## 2012-03-04 MED ORDER — ACETAMINOPHEN 500 MG PO TABS
ORAL_TABLET | ORAL | Status: AC
  Administered 2012-03-04: 500 mg
  Filled 2012-03-04: qty 1

## 2012-03-04 MED ORDER — VANCOMYCIN HCL 10 G IV SOLR
1500.0000 mg | INTRAVENOUS | Status: DC
  Administered 2012-03-05 – 2012-03-09 (×3): 1500 mg via INTRAVENOUS
  Filled 2012-03-04 (×3): qty 1500

## 2012-03-04 MED ORDER — ACETAMINOPHEN 160 MG/5ML PO SOLN: 500.0000 mg | Freq: Four times a day (QID) | ORAL | Status: DC | PRN

## 2012-03-04 MED ORDER — OSMOLITE 1.2 CAL PO LIQD
1000.0000 mL | ORAL | Status: DC
  Administered 2012-03-04 – 2012-03-07 (×2): 1000 mL
  Filled 2012-03-04 (×4): qty 1000

## 2012-03-04 NOTE — Plan of Care (Signed)
Problem: Phase I Progression Outcomes Goal: Patient tolerating nututrition at goal Outcome: Progressing Pt started on Tf Goal: Patient tolerating weaning plan Outcome: Not Progressing Pt did not tolerate wean on dayshift Goal: Tracheostomy by Vent Day 14 Outcome: Not Applicable Date Met:  03/04/12 On vent day 4

## 2012-03-04 NOTE — Progress Notes (Signed)
Pt coughing a lot and fighting the vent, diaphoretic, vent suctioned, subglottic line checked, RR 32, HR 132, T 100.2, CBG 143, RT called and came to bedside to assess, bolus of propofol given and rate increased, fentanyl rate increased, pt wiped down w/ cool wash rag, pt repositioned--> @ 2023 pt is now resting more comfortably and not fighting vent nor coughing, will medicate for fever and will cont to monitor

## 2012-03-04 NOTE — Progress Notes (Signed)
Subjective: He remains intubated and on the ventilator. He is responsive. There are no new complaints.  Objective: Vital signs in last 24 hours: Temp:  [97.3 F (36.3 C)-98 F (36.7 C)] 97.3 F (36.3 C) (12/25 0400) Pulse Rate:  [82-109] 99  (12/25 0752) Resp:  [15-20] 20  (12/25 0752) BP: (112-157)/(75-105) 152/96 mmHg (12/25 0700) SpO2:  [95 %-100 %] 96 % (12/25 0710) FiO2 (%):  [39.7 %-40.4 %] 40.1 % (12/25 0808) Weight:  [105 kg (231 lb 7.7 oz)] 105 kg (231 lb 7.7 oz) (12/25 0500) Weight change: -2.5 kg (-5 lb 8.2 oz) Last BM Date: 03/01/12  Intake/Output from previous day: 12/24 0701 - 12/25 0700 In: 3905.7 [I.V.:3421.7; IV Piggyback:484] Out: 4750 [Urine:4750]  PHYSICAL EXAM General appearance: alert, cooperative, mild distress and Intubated Resp: rhonchi bilaterally Cardio: regular rate and rhythm, S1, S2 normal, no murmur, click, rub or gallop GI: soft, non-tender; bowel sounds normal; no masses,  no organomegaly Extremities: extremities normal, atraumatic, no cyanosis or edema  Lab Results:    Basic Metabolic Panel:  Basename 03/04/12 0528 03/03/12 0249  NA 138 134*  K 3.9 4.3  CL 97 96  CO2 23 23  GLUCOSE 141* 130*  BUN 76* 53*  CREATININE 6.20* 5.87*  CALCIUM 7.7* 8.1*  MG -- --  PHOS -- --   Liver Function Tests: No results found for this basename: AST:2,ALT:2,ALKPHOS:2,BILITOT:2,PROT:2,ALBUMIN:2 in the last 72 hours No results found for this basename: LIPASE:2,AMYLASE:2 in the last 72 hours No results found for this basename: AMMONIA:2 in the last 72 hours CBC:  Basename 03/04/12 0528 03/02/12 1104 03/02/12 0427  WBC 17.2* 8.3 --  NEUTROABS -- 7.5 8.7*  HGB 13.4 12.2* --  HCT 40.0 37.0* --  MCV 92.4 93.7 --  PLT 234 196 --   Cardiac Enzymes:  Basename 03/01/12 1712 03/01/12 1014  CKTOTAL -- --  CKMB -- --  CKMBINDEX -- --  TROPONINI <0.30 0.34*   BNP: No results found for this basename: PROBNP:3 in the last 72 hours D-Dimer: No  results found for this basename: DDIMER:2 in the last 72 hours CBG:  Basename 03/04/12 0015 03/03/12 2005 03/03/12 1558 03/03/12 1127 03/03/12 0731 03/03/12 0520  GLUCAP 116* 111* 120* 107* 159* 141*   Hemoglobin A1C: No results found for this basename: HGBA1C in the last 72 hours Fasting Lipid Panel: No results found for this basename: CHOL,HDL,LDLCALC,TRIG,CHOLHDL,LDLDIRECT in the last 72 hours Thyroid Function Tests: No results found for this basename: TSH,T4TOTAL,FREET4,T3FREE,THYROIDAB in the last 72 hours Anemia Panel: No results found for this basename: VITAMINB12,FOLATE,FERRITIN,TIBC,IRON,RETICCTPCT in the last 72 hours Coagulation: No results found for this basename: LABPROT:2,INR:2 in the last 72 hours Urine Drug Screen: Drugs of Abuse     Component Value Date/Time   LABOPIA NONE DETECTED 03/01/2012 0019   COCAINSCRNUR POSITIVE* 03/01/2012 0019   LABBENZ NONE DETECTED 03/01/2012 0019   AMPHETMU NONE DETECTED 03/01/2012 0019   THCU NONE DETECTED 03/01/2012 0019   LABBARB NONE DETECTED 03/01/2012 0019    Alcohol Level: No results found for this basename: ETH:2 in the last 72 hours Urinalysis: No results found for this basename: COLORURINE:2,APPERANCEUR:2,LABSPEC:2,PHURINE:2,GLUCOSEU:2,HGBUR:2,BILIRUBINUR:2,KETONESUR:2,PROTEINUR:2,UROBILINOGEN:2,NITRITE:2,LEUKOCYTESUR:2 in the last 72 hours Misc. Labs:  ABGS  Basename 03/04/12 0430  PHART 7.318*  PO2ART 97.2  TCO2 21.4  HCO3 23.4   CULTURES Recent Results (from the past 240 hour(s))  CULTURE, BLOOD (ROUTINE X 2)     Status: Normal (Preliminary result)   Collection Time   02/29/12 11:47 PM  Component Value Range Status Comment   Specimen Description BLOOD LEFT HAND   Final    Special Requests     Final    Value: BOTTLES DRAWN AEROBIC AND ANAEROBIC AEB 6CC ANA 4CC   Culture NO GROWTH 2 DAYS   Final    Report Status PENDING   Incomplete   MRSA PCR SCREENING     Status: Abnormal   Collection Time    03/01/12  4:31 AM      Component Value Range Status Comment   MRSA by PCR INVALID RESULTS, SPECIMEN SENT FOR CULTURE (*) NEGATIVE Final   MRSA CULTURE     Status: Normal   Collection Time   03/01/12  4:31 AM      Component Value Range Status Comment   Specimen Description NOSE   Final    Special Requests NOSE   Final    Culture NOMRSA   Final    Report Status 03/03/2012 FINAL   Final   CULTURE, BLOOD (ROUTINE X 2)     Status: Normal (Preliminary result)   Collection Time   03/01/12  5:12 AM      Component Value Range Status Comment   Specimen Description BLOOD LEFT HAND   Final    Special Requests     Final    Value: BOTTLES DRAWN AEROBIC AND ANAEROBIC AEB 5CC ANA 6CC   Culture NO GROWTH 2 DAYS   Final    Report Status PENDING   Incomplete    Studies/Results: US Renal  03/02/2012  *RADIOLOGY REPORT*  Clinical Data: Elevated BUN and creatinine  RENAL/URINARY TRACT ULTRASOUND COMPLETE  Comparison:  None.  Findings:  Right Kidney:  No hydronephrosis is seen.  The right kidney measures 11.4 cm sagittally.  The echogenicity of the renal parenchyma is increased consistent with chronic renal medical disease.  There is cortical thinning present and multiple renal cysts are noted the largest in the midportion of 3.7 x 3.2 x 2.9 cm.  Left Kidney:  No hydronephrosis is seen.  The left kidney measures 11.5 cm sagittally and also is echogenic consistent with chronic renal medical disease.  Multiple cysts are present the largest in the upper pole of 3.1 x 2.5 x 2.6 cm.  Bladder:  The urinary bladder is decompressed with Foley catheter.  IMPRESSION:  1.  No hydronephrosis. 2.  Echogenic renal parenchyma consistent with chronic renal medical disease. 3.  Bilateral renal cysts.   Original Report Authenticated By: Dwyane Dee, M.D.    Dg Chest Port 1 View  03/04/2012  *RADIOLOGY REPORT*  Clinical Data: Respiratory failure  PORTABLE CHEST - 1 VIEW  Comparison: Portable chest x-ray of 03/03/2012  Findings:  The tip of the endotracheal tube is approximately 4.6 cm above the carina.  Aeration of the lungs has improved with decreasing basilar atelectasis.  Cardiomegaly is stable.  The left central venous line coils in the mid SVC.  IMPRESSION:  1.  Improved aeration. 2.  Endotracheal tip 4.6 cm above the carina. 3.  Left central venous line coils in the mid upper SVC.   Original Report Authenticated By: Dwyane Dee, M.D.    Dg Chest Port 1 View  03/03/2012  *RADIOLOGY REPORT*  Clinical Data: Worsening respiratory condition.  PORTABLE CHEST - 1 VIEW  Comparison: Chest radiograph performed 03/02/2012  Findings: The patient's endotracheal tube is seen ending approximately 5 cm above the carina.  A left PICC is noted ending likely about the proximal SVC.  An enteric tube is noted  extending below the diaphragm.  The lungs are well-aerated.  Mildly worsened right basilar airspace opacity is seen, and there is mild retrocardiac opacity, raising concern for mild pneumonia.  No definite pleural effusion or pneumothorax is seen.  The cardiomediastinal silhouette is within normal limits.  No acute osseous abnormalities are seen.  IMPRESSION: Mildly worsened right basilar airspace opacity, and mild retrocardiac opacity, raising concern for mild pneumonia, though atelectasis could have a similar appearance.   Original Report Authenticated By: Tonia Ghent, M.D.    Dg Chest Port 1v Same Day  03/02/2012  *RADIOLOGY REPORT*  Clinical Data: PICC placement  PORTABLE CHEST - 1 VIEW SAME DAY  Comparison: 03/02/2012  Findings: Left arm PICC is in place the tip in the SVC.  NG tube in the stomach.  Endotracheal tube in good position and unchanged.  Underlying emphysema is present.  Mild atelectasis in the lung bases.  IMPRESSION: Left PICC tip in the SVC.  No other changes.   Original Report Authenticated By: Janeece Riggers, M.D.     Medications:  Prior to Admission:  Prescriptions prior to admission  Medication Sig Dispense Refill   . albuterol (VENTOLIN HFA) 108 (90 BASE) MCG/ACT inhaler Inhale 2 puffs into the lungs every 4 (four) hours as needed. For shortness of breath      . cetirizine (ZYRTEC) 10 MG tablet Take 1 tablet (10 mg total) by mouth daily.      . cloNIDine (CATAPRES) 0.2 MG tablet Take 0.2 mg by mouth 2 (two) times daily.      . Fluticasone-Salmeterol (ADVAIR DISKUS) 250-50 MCG/DOSE AEPB Inhale 1 puff into the lungs every 12 (twelve) hours.        Marland Kitchen HYDROcodone-acetaminophen (NORCO) 5-325 MG per tablet Take 1 tablet by mouth every 6 (six) hours as needed for pain.  60 tablet  5  . ipratropium-albuterol (DUONEB) 0.5-2.5 (3) MG/3ML SOLN Take 3 mLs by nebulization 2 (two) times daily as needed. For shortness of breath      . lisinopril (PRINIVIL,ZESTRIL) 40 MG tablet Take 40 mg by mouth daily.      . methotrexate (RHEUMATREX) 2.5 MG tablet Take 10 mg by mouth once a week.      . predniSONE (DELTASONE) 10 MG tablet Take 10 mg by mouth daily.      . ranitidine (ZANTAC) 300 MG tablet Take 300 mg by mouth at bedtime.      . Vitamin D, Ergocalciferol, (DRISDOL) 50000 UNITS CAPS Take 50,000 Units by mouth 2 (two) times a week.       Scheduled:   . antiseptic oral rinse  15 mL Mouth Rinse QID  . azithromycin  500 mg Intravenous Q24H  . cefTRIAXone (ROCEPHIN)  IV  1 g Intravenous Q24H  . chlorhexidine  15 mL Mouth/Throat BID  . enoxaparin (LOVENOX) injection  30 mg Subcutaneous Q24H  . folic acid  1 mg Intravenous Daily  . furosemide  120 mg Intravenous BID  . ipratropium  0.5 mg Nebulization Q4H  . levalbuterol  0.63 mg Nebulization Q4H  . methylPREDNISolone (SOLU-MEDROL) injection  125 mg Intravenous Q6H  . metoprolol  2.5 mg Intravenous Q6H  . oseltamivir  75 mg Per Tube BID  . pantoprazole (PROTONIX) IV  40 mg Intravenous QHS  . sodium chloride  10-40 mL Intracatheter Q12H  . thiamine  100 mg Intravenous Daily   Continuous:   . sodium chloride 20 mL/hr (03/01/12 0500)  . sodium chloride 125 mL/hr at  03/04/12 0932  .  feeding supplement (OSMOLITE 1.2 CAL)    . fentaNYL infusion INTRAVENOUS 25 mcg/hr (03/04/12 0934)  . propofol 5 mcg/kg/min (03/04/12 0934)   AOZ:HYQMVH chloride, fentaNYL, levalbuterol, LORazepam, sodium chloride  Assesment: He has acute respiratory failure. He has severe COPD. He has chronic kidney disease with acute worsening. He has blood pressure problems and has had tachycardia which are improved. Principal Problem:  *Respiratory failure, acute Active Problems:  CHRONIC OBSTRUCTIVE PULMONARY DISEASE  Chronic kidney disease, stage 2, mildly decreased GFR  COPD exacerbation  Sinus tachycardia  Substance abuse  Acute on chronic renal failure  Influenza A (H1N1)    Plan: He tried weaning again today and was unable to do so. He is better however. He is on 3 antibiotics for pneumonia and he is on Tamiflu    LOS: 4 days   Shreya Lacasse L 03/04/2012, 9:48 AM

## 2012-03-04 NOTE — Progress Notes (Signed)
Pt did not tolerate weaning attempt this morning, will try again tomorow. Renal function worsening, per Dr Wolfgang Phoenix,  pt may need dialysis within the next few days. Pt easily arousable and calm on 50% sedation. Able to nod head to answer questions, and  Denies pain. Reatha Armour, has called twice to ask about pt. She will visit after work.

## 2012-03-04 NOTE — Progress Notes (Signed)
Called into patient room by nursing staff;patient aggaitated and coughing; nurses had suction patient and patient still aggiaitated. Patient was checked over and repositioned; saturation were lower and turned up to 50%. Patient still remained around 93% and was taken off ventilator and bagged  To check for plugging;all clear and placed back on ventilator; Patient sedation and pain medication was turned up;will continue to monitor.

## 2012-03-04 NOTE — Progress Notes (Signed)
AZARIAS CHIOU  MRN: [id number]  DOB/AGE: 11-Apr-1955 56 y.o.  Primary Care Physician:PATTERSON, KATHY, FNP  Admit date: 02/29/2012  Chief Complaint:  Chief Complaint  Patient presents with  . Respiratory Distress    S-Pt presented on  02/29/2012 with  Chief Complaint  Patient presents with  . Respiratory Distress   Pt intubated but opens eyes  Meds    . antiseptic oral rinse  15 mL Mouth Rinse QID  . azithromycin  500 mg Intravenous Q24H  . cefTRIAXone (ROCEPHIN)  IV  1 g Intravenous Q24H  . chlorhexidine  15 mL Mouth/Throat BID  . enoxaparin (LOVENOX) injection  30 mg Subcutaneous Q24H  . folic acid  1 mg Intravenous Daily  . furosemide  120 mg Intravenous BID  . ipratropium  0.5 mg Nebulization Q4H  . levalbuterol  0.63 mg Nebulization Q4H  . methylPREDNISolone (SOLU-MEDROL) injection  125 mg Intravenous Q6H  . metoprolol  2.5 mg Intravenous Q6H  . oseltamivir  75 mg Per Tube BID  . pantoprazole (PROTONIX) IV  40 mg Intravenous QHS  . sodium chloride  10-40 mL Intracatheter Q12H  . thiamine  100 mg Intravenous Daily  . vancomycin  1,500 mg Intravenous Q48H     Physical Exam: Vital signs in last 24 hours: Temp:  [97.3 F (36.3 C)-98.1 F (36.7 C)] 98.1 F (36.7 C) (12/25 1119) Pulse Rate:  [82-109] 93  (12/25 1200) Resp:  [15-20] 20  (12/25 1200) BP: (112-157)/(74-105) 125/74 mmHg (12/25 1200) SpO2:  [94 %-100 %] 96 % (12/25 1200) FiO2 (%):  [39.7 %-100 %] 40.2 % (12/25 1200) Weight:  [231 lb 7.7 oz (105 kg)] 231 lb 7.7 oz (105 kg) (12/25 0500) Weight change: -5 lb 8.2 oz (-2.5 kg) Last BM Date: 03/01/12  Intake/Output from previous day: 12/24 0701 - 12/25 0700 In: 3905.7 [I.V.:3421.7; IV Piggyback:484] Out: 4750 [Urine:4750] Total I/O In: 491.5 [I.V.:429.5; IV Piggyback:62] Out: 1000 [Urine:1000]   Physical Exam: General- pt is awake,opens eyes Resp- intubated B/L Breath sounds + CVS- S1S2 regular in rate and rhythm GIT- BS+, soft, NT,  ND EXT- Trace LE Edema, NO  Cyanosis   Lab Results: CBC  Basename 03/04/12 0528 03/02/12 1104  WBC 17.2* 8.3  HGB 13.4 12.2*  HCT 40.0 37.0*  PLT 234 196    BMET  Basename 03/04/12 0528 03/03/12 0249  NA 138 134*  K 3.9 4.3  CL 97 96  CO2 23 23  GLUCOSE 141* 130*  BUN 76* 53*  CREATININE 6.20* 5.87*  CALCIUM 7.7* 8.1*   Creat trend  2013 1.3==>2.1==>3.67 ==>5.87==>6.20 2012 1.2--1.6  2011 1.3--1.5  2010 1.3--1.4  2009 1.2--1.4   MICRO Recent Results (from the past 240 hour(s))  CULTURE, BLOOD (ROUTINE X 2)     Status: Normal (Preliminary result)   Collection Time   02/29/12 11:47 PM      Component Value Range Status Comment   Specimen Description BLOOD LEFT HAND   Final    Special Requests     Final    Value: BOTTLES DRAWN AEROBIC AND ANAEROBIC AEB 6CC ANA 4CC   Culture NO GROWTH 3 DAYS   Final    Report Status PENDING   Incomplete   MRSA PCR SCREENING     Status: Abnormal   Collection Time   03/01/12  4:31 AM      Component Value Range Status Comment   MRSA by PCR INVALID RESULTS, SPECIMEN SENT FOR CULTURE (*) NEGATIVE Final   MRSA CULTURE  Status: Normal   Collection Time   03/01/12  4:31 AM      Component Value Range Status Comment   Specimen Description NOSE   Final    Special Requests NOSE   Final    Culture NOMRSA   Final    Report Status 03/03/2012 FINAL   Final   CULTURE, BLOOD (ROUTINE X 2)     Status: Normal (Preliminary result)   Collection Time   03/01/12  5:12 AM      Component Value Range Status Comment   Specimen Description BLOOD LEFT HAND   Final    Special Requests     Final    Value: BOTTLES DRAWN AEROBIC AND ANAEROBIC AEB 5CC ANA 6CC   Culture NO GROWTH 3 DAYS   Final    Report Status PENDING   Incomplete        Impression: 1)Renal  AKi on CKD  AKI secondary to Prerenal / ATN  AKI secondary to sepsis + Hypotension + ACE ( as outpt)  Most Likely NON Oliguric ATN AKi worsening  NO need of renal replacement therapy today  -but may need sooner. CKD stage 3 .  CKD since 2009  CKD secondary to HTN   2)HTN BP at goal  3)Anemia HGb at goal (9--11)   4)ID- admitted with Influenza /sepsis On tamiflu  + ABX  5)Resp- Admitted with resp failure  intubated   6)FEN  Normokalemic  NOrmonatremic   7)Acid base  Resp acidosis- much better\ Ph 7.21==>7.31 CO2 61==>47  8) Substance abuse-  Cocaine +, Hx of ETOH and Tobacco as well        Plan:  Will continue current tx and plan Will follow bmet      Shanae Luo S 03/04/2012, 12:34 PM

## 2012-03-05 ENCOUNTER — Inpatient Hospital Stay (HOSPITAL_COMMUNITY): Payer: Medicare Other

## 2012-03-05 DIAGNOSIS — J189 Pneumonia, unspecified organism: Secondary | ICD-10-CM | POA: Diagnosis present

## 2012-03-05 LAB — BLOOD GAS, ARTERIAL
Bicarbonate: 21.6 mEq/L (ref 20.0–24.0)
Bicarbonate: 22.5 mEq/L (ref 20.0–24.0)
Drawn by: 22223
PEEP: 5 cmH2O
PEEP: 5 cmH2O
Patient temperature: 37
Patient temperature: 37
RATE: 20 resp/min
pH, Arterial: 7.2 — ABNORMAL LOW (ref 7.350–7.450)
pH, Arterial: 7.287 — ABNORMAL LOW (ref 7.350–7.450)
pO2, Arterial: 128 mmHg — ABNORMAL HIGH (ref 80.0–100.0)

## 2012-03-05 LAB — CBC WITH DIFFERENTIAL/PLATELET
Basophils Absolute: 0 10*3/uL (ref 0.0–0.1)
Basophils Relative: 0 % (ref 0–1)
HCT: 33.8 % — ABNORMAL LOW (ref 39.0–52.0)
Lymphocytes Relative: 3 % — ABNORMAL LOW (ref 12–46)
Monocytes Absolute: 0.2 10*3/uL (ref 0.1–1.0)
Neutro Abs: 7.6 10*3/uL (ref 1.7–7.7)
Neutrophils Relative %: 95 % — ABNORMAL HIGH (ref 43–77)
Platelets: 172 10*3/uL (ref 150–400)
RDW: 15.1 % (ref 11.5–15.5)
WBC: 8 10*3/uL (ref 4.0–10.5)

## 2012-03-05 LAB — BASIC METABOLIC PANEL
Chloride: 100 mEq/L (ref 96–112)
Creatinine, Ser: 6.07 mg/dL — ABNORMAL HIGH (ref 0.50–1.35)
GFR calc Af Amer: 11 mL/min — ABNORMAL LOW (ref >90)
GFR calc non Af Amer: 9 mL/min — ABNORMAL LOW (ref >90)

## 2012-03-05 LAB — GLUCOSE, CAPILLARY
Glucose-Capillary: 99 mg/dL (ref 70–99)
Glucose-Capillary: 113 mg/dL — ABNORMAL HIGH (ref 70–99)

## 2012-03-05 MED ORDER — METOPROLOL TARTRATE 1 MG/ML IV SOLN: 2.5000 mg | Freq: Once | INTRAVENOUS | Status: AC | PRN

## 2012-03-05 MED ORDER — METOPROLOL TARTRATE 1 MG/ML IV SOLN
2.5000 mg | Freq: Once | INTRAVENOUS | Status: AC
  Administered 2012-03-05: 2.5 mg via INTRAVENOUS

## 2012-03-05 NOTE — Progress Notes (Signed)
Patient became anxious, diaphoretic, gagging, and abdominal breathing at 0720. Sedation had been cut off at 0630. Inline suctioned for scant amount of thin sputum. Oral suctioned, with small amount of thin secretions.Tube feed residuals checked, equaled 10 cc. Patient reassured. O2 sats remain in the high 90's. RR high 20's. Propofol resumed at 15 ml/hr, fentanyl at 5 mcg/hr.

## 2012-03-05 NOTE — Progress Notes (Signed)
[name]  MRN: [id number]  DOB/AGE: 1955-03-25 56 y.o.  Primary Care Physician:PATTERSON, [name]  Admit date: 02/29/2012  Chief Complaint:  Chief Complaint  Patient presents with  . Respiratory Distress    [name]-Pt presented on  02/29/2012 with  Chief Complaint  Patient presents with  . Respiratory Distress   Pt intubated but opens eyes  Meds    . antiseptic oral rinse  15 mL Mouth Rinse QID  . azithromycin  500 mg Intravenous Q24H  . cefTRIAXone (ROCEPHIN)  IV  1 g Intravenous Q24H  . chlorhexidine  15 mL Mouth/Throat BID  . enoxaparin (LOVENOX) injection  30 mg Subcutaneous Q24H  . folic acid  1 mg Intravenous Daily  . furosemide  120 mg Intravenous BID  . ipratropium  0.5 mg Nebulization Q4H  . levalbuterol  0.63 mg Nebulization Q4H  . methylPREDNISolone (SOLU-MEDROL) injection  125 mg Intravenous Q6H  . metoprolol  2.5 mg Intravenous Q6H  . oseltamivir  75 mg Per Tube BID  . pantoprazole (PROTONIX) IV  40 mg Intravenous QHS  . sodium chloride  10-40 mL Intracatheter Q12H  . thiamine  100 mg Intravenous Daily  . vancomycin  1,500 mg Intravenous Q48H     Physical Exam: Vital signs in last 24 hours: Temp:  [98.1 F (36.7 C)-100.2 F (37.9 C)] 98.1 F (36.7 C) (12/26 0826) Pulse Rate:  [78-114] 97  (12/26 0826) Resp:  [13-21] 20  (12/26 0826) BP: (108-165)/(68-107) 115/89 mmHg (12/26 0800) SpO2:  [92 %-98 %] 97 % (12/26 1124) FiO2 (%):  [39.7 %-50.4 %] 40 % (12/26 1124) Weight:  [238 lb 8.6 oz (108.2 kg)] 238 lb 8.6 oz (108.2 kg) (12/26 0500) Weight change: 7 lb 0.9 oz (3.2 kg) Last BM Date: 03/01/12  Intake/Output from previous day: 12/25 0701 - 12/26 0700 In: 2906.6 [I.V.:2322.6; NG/GT:150; IV Piggyback:434] Out: 3805 [Urine:3750; Emesis/NG output:55] Total I/O In: 197 [I.V.:135; IV Piggyback:62] Out: -    Physical Exam: General- pt is awake,opens eyes Resp- intubated B/L Breath sounds + CVS- S1S2 regular in rate and rhythm GIT- BS+,  soft, NT, ND EXT- Trace LE Edema, NO  Cyanosis   Lab Results: CBC  Basename 03/05/12 0329 03/04/12 0528  WBC 8.0 17.2*  HGB 11.5* 13.4  HCT 33.8* 40.0  PLT 172 234    BMET  Basename 03/05/12 0329 03/04/12 0528  NA 140 138  K 3.9 3.9  CL 100 97  CO2 23 23  GLUCOSE 172* 141*  BUN 96* 76*  CREATININE 6.07* 6.20*  CALCIUM 7.2* 7.7*   Creat trend  2013 1.3==>2.1==>3.67 ==>5.87==>6.20==>6.0 2012 1.2--1.6  2011 1.3--1.5  2010 1.3--1.4  2009 1.2--1.4   MICRO Recent Results (from the past 240 hour([name]))  CULTURE, BLOOD (ROUTINE X 2)     Status: Normal (Preliminary result)   Collection Time   02/29/12 11:47 PM      Component Value Range Status Comment   Specimen Description BLOOD LEFT HAND   Final    Special Requests     Final    Value: BOTTLES DRAWN AEROBIC AND ANAEROBIC AEB 6CC ANA 4CC   Culture NO GROWTH 4 DAYS   Final    Report Status PENDING   Incomplete   MRSA PCR SCREENING     Status: Abnormal   Collection Time   03/01/12  4:31 AM      Component Value Range Status Comment   MRSA by PCR INVALID RESULTS, SPECIMEN SENT FOR CULTURE (*) NEGATIVE Final  MRSA CULTURE     Status: Normal   Collection Time   03/01/12  4:31 AM      Component Value Range Status Comment   Specimen Description NOSE   Final    Special Requests NOSE   Final    Culture [facility]   Final    Report Status 03/03/2012 FINAL   Final   CULTURE, BLOOD (ROUTINE X 2)     Status: Normal (Preliminary result)   Collection Time   03/01/12  5:12 AM      Component Value Range Status Comment   Specimen Description BLOOD LEFT HAND   Final    Special Requests     Final    Value: BOTTLES DRAWN AEROBIC AND ANAEROBIC AEB 5CC ANA 6CC   Culture NO GROWTH 4 DAYS   Final    Report Status PENDING   Incomplete        Impression: 1)Renal  AKi on CKD  AKI secondary to Prerenal / ATN  AKI secondary to sepsis + Hypotension + ACE ( as outpt)  Most Likely NON Oliguric ATN ATN now plateau  NO need of renal  replacement therapy - most likely will not need if creat keeps on improving  CKD stage 3 .  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

## 2012-03-05 NOTE — Progress Notes (Signed)
NUTRITION FOLLOW UP  Intervention:   Osmolite 1.2 @ 30 ml/hr provides:864kcal,40 gr protein, 590 ml water which meets 42% energy, 25% protein needs via TF.   Nutrition Dx:   Inadequate oral intake related to inability to eat as evidenced by NPO status.  Goal:   Enteral nutrition to provide 60-70% of estimated calorie needs (22-25 kcals/kg ideal body weight) and >/= 90% of estimated protein needs, based on ASPEN guidelines for permissive underfeeding in critically ill obese individuals. Goal not met.  Monitor:   Monitor vent status and TF rate and tolerance  Assessment:   Patient continues intubated on ventilator support. TF started and pt is tolerating per RN. Residuals 10 ml.   MV: 8.6 ml  Temp:Temp (24hrs), Avg:99.2 F (37.3 C), Min:98.1 F (36.7 C), Max:100.2 F (37.9 C)  Propofol: 9.7 ml/hr caloric load-256 kcal lipids  Height: Ht Readings from Last 1 Encounters:  03/02/12 6' (1.829 m)    Weight Status:   Wt Readings from Last 1 Encounters:  03/05/12 238 lb 8.6 oz (108.2 kg)    Re-estimated needs:  Kcal: 2052 Protein: 162 gr Fluid: 1 ml/kcal  Skin: no issues noted  Diet Order: NPO   Intake/Output Summary (Last 24 hours) at 03/05/12 1250 Last data filed at 03/05/12 1200  Gross per 24 hour  Intake 3147.75 ml  Output   4205 ml  Net -1057.25 ml    Last BM: 03/01/12   Labs:   Lab 03/05/12 0329 03/04/12 0528 03/03/12 0249  NA 140 138 134*  K 3.9 3.9 4.3  CL 100 97 96  CO2 23 23 23   BUN 96* 76* 53*  CREATININE 6.07* 6.20* 5.87*  CALCIUM 7.2* 7.7* 8.1*  MG -- -- --  PHOS -- -- --  GLUCOSE 172* 141* 130*    CBG (last 3)   Basename 03/05/12 0824 03/05/12 0402 03/04/12 2337  GLUCAP 113* 99 126*    Scheduled Meds:   . antiseptic oral rinse  15 mL Mouth Rinse QID  . azithromycin  500 mg Intravenous Q24H  . cefTRIAXone (ROCEPHIN)  IV  1 g Intravenous Q24H  . chlorhexidine  15 mL Mouth/Throat BID  . enoxaparin (LOVENOX) injection  30 mg  Subcutaneous Q24H  . folic acid  1 mg Intravenous Daily  . furosemide  120 mg Intravenous BID  . ipratropium  0.5 mg Nebulization Q4H  . levalbuterol  0.63 mg Nebulization Q4H  . methylPREDNISolone (SOLU-MEDROL) injection  125 mg Intravenous Q6H  . metoprolol  2.5 mg Intravenous Q6H  . oseltamivir  75 mg Per Tube BID  . pantoprazole (PROTONIX) IV  40 mg Intravenous QHS  . sodium chloride  10-40 mL Intracatheter Q12H  . thiamine  100 mg Intravenous Daily  . vancomycin  1,500 mg Intravenous Q48H    Continuous Infusions:   . sodium chloride 10 mL/hr at 03/05/12 1100  . sodium chloride 125 mL/hr at 03/05/12 0800  . feeding supplement (OSMOLITE 1.2 CAL) 1,000 mL (03/04/12 1046)  . fentaNYL infusion INTRAVENOUS 5 mcg/hr (03/05/12 1200)  . propofol 15 mcg/kg/min (03/05/12 1200)    #[phone]

## 2012-03-05 NOTE — Progress Notes (Signed)
Subjective: He remains intubated and sedated. Attempt was made earlier today to see if he could be weaned but he was not able to. He is back on full sedation and ventilator support.  Objective: Vital signs in last 24 hours: Temp:  [98.1 F (36.7 C)-100.2 F (37.9 C)] 98.1 F (36.7 C) (12/26 0826) Pulse Rate:  [78-114] 97  (12/26 0826) Resp:  [13-21] 20  (12/26 0826) BP: (108-165)/(68-107) 115/89 mmHg (12/26 0800) SpO2:  [92 %-98 %] 93 % (12/26 0826) FiO2 (%):  [39.7 %-50.4 %] 40.3 % (12/26 0826) Weight:  [108.2 kg (238 lb 8.6 oz)] 108.2 kg (238 lb 8.6 oz) (12/26 0500) Weight change: 3.2 kg (7 lb 0.9 oz) Last BM Date: 03/01/12  Intake/Output from previous day: 12/25 0701 - 12/26 0700 In: 2906.6 [I.V.:2322.6; NG/GT:150; IV Piggyback:434] Out: 3805 [Urine:3750; Emesis/NG output:55]  PHYSICAL EXAM General appearance: Intubated and sedated. Resp: rhonchi bilaterally Cardio: regular rate and rhythm, S1, S2 normal, no murmur, click, rub or gallop GI: soft, non-tender; bowel sounds normal; no masses,  no organomegaly Extremities: He has some third spacing of fluid  Lab Results:    Basic Metabolic Panel:  Basename 03/05/12 0329 03/04/12 0528  NA 140 138  K 3.9 3.9  CL 100 97  CO2 23 23  GLUCOSE 172* 141*  BUN 96* 76*  CREATININE 6.07* 6.20*  CALCIUM 7.2* 7.7*  MG -- --  PHOS -- --   Liver Function Tests: No results found for this basename: AST:2,ALT:2,ALKPHOS:2,BILITOT:2,PROT:2,ALBUMIN:2 in the last 72 hours No results found for this basename: LIPASE:2,AMYLASE:2 in the last 72 hours No results found for this basename: AMMONIA:2 in the last 72 hours CBC:  Basename 03/05/12 0329 03/04/12 0528 03/02/12 1104  WBC 8.0 17.2* --  NEUTROABS 7.6 -- 7.5  HGB 11.5* 13.4 --  HCT 33.8* 40.0 --  MCV 91.8 92.4 --  PLT 172 234 --   Cardiac Enzymes: No results found for this basename: CKTOTAL:3,CKMB:3,CKMBINDEX:3,TROPONINI:3 in the last 72 hours BNP: No results found for this  basename: PROBNP:3 in the last 72 hours D-Dimer: No results found for this basename: DDIMER:2 in the last 72 hours CBG:  Basename 03/05/12 0824 03/05/12 0402 03/04/12 2337 03/04/12 2006 03/04/12 1119 03/04/12 0015  GLUCAP 113* 99 126* 143* 118* 116*   Hemoglobin A1C: No results found for this basename: HGBA1C in the last 72 hours Fasting Lipid Panel: No results found for this basename: CHOL,HDL,LDLCALC,TRIG,CHOLHDL,LDLDIRECT in the last 72 hours Thyroid Function Tests: No results found for this basename: TSH,T4TOTAL,FREET4,T3FREE,THYROIDAB in the last 72 hours Anemia Panel: No results found for this basename: VITAMINB12,FOLATE,FERRITIN,TIBC,IRON,RETICCTPCT in the last 72 hours Coagulation: No results found for this basename: LABPROT:2,INR:2 in the last 72 hours Urine Drug Screen: Drugs of Abuse     Component Value Date/Time   LABOPIA NONE DETECTED 03/01/2012 0019   COCAINSCRNUR POSITIVE* 03/01/2012 0019   LABBENZ NONE DETECTED 03/01/2012 0019   AMPHETMU NONE DETECTED 03/01/2012 0019   THCU NONE DETECTED 03/01/2012 0019   LABBARB NONE DETECTED 03/01/2012 0019    Alcohol Level: No results found for this basename: ETH:2 in the last 72 hours Urinalysis: No results found for this basename: COLORURINE:2,APPERANCEUR:2,LABSPEC:2,PHURINE:2,GLUCOSEU:2,HGBUR:2,BILIRUBINUR:2,KETONESUR:2,PROTEINUR:2,UROBILINOGEN:2,NITRITE:2,LEUKOCYTESUR:2 in the last 72 hours Misc. Labs:  ABGS  Basename 03/05/12 0520  PHART 7.287*  PO2ART 128.0*  TCO2 20.9  HCO3 22.5   CULTURES Recent Results (from the past 240 hour(s))  CULTURE, BLOOD (ROUTINE X 2)     Status: Normal (Preliminary result)   Collection Time   02/29/12 11:47 PM  Component Value Range Status Comment   Specimen Description BLOOD LEFT HAND   Final    Special Requests     Final    Value: BOTTLES DRAWN AEROBIC AND ANAEROBIC AEB 6CC ANA 4CC   Culture NO GROWTH 3 DAYS   Final    Report Status PENDING   Incomplete   MRSA PCR  SCREENING     Status: Abnormal   Collection Time   03/01/12  4:31 AM      Component Value Range Status Comment   MRSA by PCR INVALID RESULTS, SPECIMEN SENT FOR CULTURE (*) NEGATIVE Final   MRSA CULTURE     Status: Normal   Collection Time   03/01/12  4:31 AM      Component Value Range Status Comment   Specimen Description NOSE   Final    Special Requests NOSE   Final    Culture NOMRSA   Final    Report Status 03/03/2012 FINAL   Final   CULTURE, BLOOD (ROUTINE X 2)     Status: Normal (Preliminary result)   Collection Time   03/01/12  5:12 AM      Component Value Range Status Comment   Specimen Description BLOOD LEFT HAND   Final    Special Requests     Final    Value: BOTTLES DRAWN AEROBIC AND ANAEROBIC AEB 5CC ANA 6CC   Culture NO GROWTH 3 DAYS   Final    Report Status PENDING   Incomplete    Studies/Results: Dg Chest Port 1 View  03/05/2012  *RADIOLOGY REPORT*  Clinical Data: Respiratory failure.  PORTABLE CHEST - 1 VIEW  Comparison: Chest x-ray 03/04/2012.  Findings: An endotracheal tube is in place with tip 5.3 cm above the carina. A nasogastric tube is seen extending into the stomach, however, the tip of the nasogastric tube extends below the lower margin of the image.  There is a left upper extremity PICC with tip terminating in the mid superior vena cava. No definite acute consolidated air space disease.  Probable small right pleural effusion.  Pulmonary vasculature is normal.  Heart size is within normal limits. The patient is rotated to the right on today's exam, resulting in distortion of the mediastinal contours and reduced diagnostic sensitivity and specificity for mediastinal pathology. Atherosclerosis in the thoracic aorta.  IMPRESSION: 1.  Support apparatus, as above. 2.  Small right pleural effusion. 3.  Atherosclerosis.   Original Report Authenticated By: Trudie Reed, M.D.    Dg Chest Port 1 View  03/04/2012  *RADIOLOGY REPORT*  Clinical Data: Respiratory failure   PORTABLE CHEST - 1 VIEW  Comparison: Portable chest x-ray of 03/03/2012  Findings: The tip of the endotracheal tube is approximately 4.6 cm above the carina.  Aeration of the lungs has improved with decreasing basilar atelectasis.  Cardiomegaly is stable.  The left central venous line coils in the mid SVC.  IMPRESSION:  1.  Improved aeration. 2.  Endotracheal tip 4.6 cm above the carina. 3.  Left central venous line coils in the mid upper SVC.   Original Report Authenticated By: Dwyane Dee, M.D.     Medications:  Prior to Admission:  Prescriptions prior to admission  Medication Sig Dispense Refill  . albuterol (VENTOLIN HFA) 108 (90 BASE) MCG/ACT inhaler Inhale 2 puffs into the lungs every 4 (four) hours as needed. For shortness of breath      . cetirizine (ZYRTEC) 10 MG tablet Take 1 tablet (10 mg total) by mouth daily.      Marland Kitchen  cloNIDine (CATAPRES) 0.2 MG tablet Take 0.2 mg by mouth 2 (two) times daily.      . Fluticasone-Salmeterol (ADVAIR DISKUS) 250-50 MCG/DOSE AEPB Inhale 1 puff into the lungs every 12 (twelve) hours.        Marland Kitchen HYDROcodone-acetaminophen (NORCO) 5-325 MG per tablet Take 1 tablet by mouth every 6 (six) hours as needed for pain.  60 tablet  5  . ipratropium-albuterol (DUONEB) 0.5-2.5 (3) MG/3ML SOLN Take 3 mLs by nebulization 2 (two) times daily as needed. For shortness of breath      . lisinopril (PRINIVIL,ZESTRIL) 40 MG tablet Take 40 mg by mouth daily.      . methotrexate (RHEUMATREX) 2.5 MG tablet Take 10 mg by mouth once a week.      . predniSONE (DELTASONE) 10 MG tablet Take 10 mg by mouth daily.      . ranitidine (ZANTAC) 300 MG tablet Take 300 mg by mouth at bedtime.      . Vitamin D, Ergocalciferol, (DRISDOL) 50000 UNITS CAPS Take 50,000 Units by mouth 2 (two) times a week.       Scheduled:   . antiseptic oral rinse  15 mL Mouth Rinse QID  . azithromycin  500 mg Intravenous Q24H  . cefTRIAXone (ROCEPHIN)  IV  1 g Intravenous Q24H  . chlorhexidine  15 mL Mouth/Throat  BID  . enoxaparin (LOVENOX) injection  30 mg Subcutaneous Q24H  . folic acid  1 mg Intravenous Daily  . furosemide  120 mg Intravenous BID  . ipratropium  0.5 mg Nebulization Q4H  . levalbuterol  0.63 mg Nebulization Q4H  . methylPREDNISolone (SOLU-MEDROL) injection  125 mg Intravenous Q6H  . metoprolol  2.5 mg Intravenous Q6H  . oseltamivir  75 mg Per Tube BID  . pantoprazole (PROTONIX) IV  40 mg Intravenous QHS  . sodium chloride  10-40 mL Intracatheter Q12H  . thiamine  100 mg Intravenous Daily  . vancomycin  1,500 mg Intravenous Q48H   Continuous:   . sodium chloride 10 mL/hr at 03/05/12 0800  . sodium chloride 125 mL/hr at 03/05/12 0800  . feeding supplement (OSMOLITE 1.2 CAL) 1,000 mL (03/04/12 1046)  . fentaNYL infusion INTRAVENOUS Stopped (03/05/12 0755)  . propofol Stopped (03/05/12 0755)   RUE:AVWUJW chloride, acetaminophen (TYLENOL) oral liquid 160 mg/5 mL, fentaNYL, levalbuterol, LORazepam, sodium chloride  Assesment: He has acute respiratory failure. He has acute on chronic renal failure and it appears that the rising creatinine has now stopped. He was unable to be weaned today. He has pretty severe COPD. He has pneumonia and has influenza A. His renal failure is nonoliguric Principal Problem:  *Respiratory failure, acute Active Problems:  CHRONIC OBSTRUCTIVE PULMONARY DISEASE  Chronic kidney disease, stage 2, mildly decreased GFR  COPD exacerbation  Sinus tachycardia  Substance abuse  Acute on chronic renal failure  Influenza A (H1N1)    Plan: No change in treatments I don't think is much else we can do at this point. He's going to continue on ventilator support continue with medications for blood pressure heart rate antibiotics etc.    LOS: 5 days   Amir Fick L 03/05/2012, 9:05 AM

## 2012-03-06 ENCOUNTER — Inpatient Hospital Stay (HOSPITAL_COMMUNITY): Payer: Medicare Other

## 2012-03-06 LAB — BLOOD GAS, ARTERIAL
Acid-Base Excess: 1.2 mmol/L (ref 0.0–2.0)
Acid-Base Excess: 0.9 mmol/L (ref 0.0–2.0)
Bicarbonate: 26.7 meq/L — ABNORMAL HIGH (ref 20.0–24.0)
Drawn by: 22223
FIO2: 40 %
FIO2: 40 %
MECHVT: 500 mL
Mode: POSITIVE
O2 Saturation: 97.1 %
O2 Saturation: 95.1 %
PEEP: 5 cmH2O
Pressure support: 5 cmH2O
RATE: 20 resp/min
TCO2: 24.6 mmol/L (ref 0–100)
TCO2: 23.8 mmol/L (ref 0–100)
pCO2 arterial: 54.8 mmHg — ABNORMAL HIGH (ref 35.0–45.0)
pCO2 arterial: 56.6 mmHg — ABNORMAL HIGH (ref 35.0–45.0)
pH, Arterial: 7.295 — ABNORMAL LOW (ref 7.350–7.450)
pO2, Arterial: 94.2 mmHg (ref 80.0–100.0)
pO2, Arterial: 86.1 mmHg (ref 80.0–100.0)

## 2012-03-06 LAB — CBC WITH DIFFERENTIAL/PLATELET
Eosinophils Absolute: 0 10*3/uL (ref 0.0–0.7)
Eosinophils Relative: 0 % (ref 0–5)
HCT: 37 % — ABNORMAL LOW (ref 39.0–52.0)
Lymphocytes Relative: 5 % — ABNORMAL LOW (ref 12–46)
Lymphs Abs: 0.4 10*3/uL — ABNORMAL LOW (ref 0.7–4.0)
MCH: 30.8 pg (ref 26.0–34.0)
MCV: 91.8 fL (ref 78.0–100.0)
Monocytes Absolute: 0.4 10*3/uL (ref 0.1–1.0)
Platelets: 186 10*3/uL (ref 150–400)
RBC: 4.03 MIL/uL — ABNORMAL LOW (ref 4.22–5.81)
WBC: 8.5 10*3/uL (ref 4.0–10.5)

## 2012-03-06 LAB — COMPREHENSIVE METABOLIC PANEL
Albumin: 2.8 g/dL — ABNORMAL LOW (ref 3.5–5.2)
BUN: 99 mg/dL — ABNORMAL HIGH (ref 6–23)
Creatinine, Ser: 4.85 mg/dL — ABNORMAL HIGH (ref 0.50–1.35)
GFR calc Af Amer: 14 mL/min — ABNORMAL LOW (ref >90)
Glucose, Bld: 162 mg/dL — ABNORMAL HIGH (ref 70–99)
Total Protein: 5.9 g/dL — ABNORMAL LOW (ref 6.0–8.3)

## 2012-03-06 LAB — GLUCOSE, CAPILLARY
Glucose-Capillary: 137 mg/dL — ABNORMAL HIGH (ref 70–99)
Glucose-Capillary: 190 mg/dL — ABNORMAL HIGH (ref 70–99)

## 2012-03-06 LAB — CULTURE, BLOOD (ROUTINE X 2)

## 2012-03-06 MED ORDER — FREE WATER
250.0000 mL | Freq: Four times a day (QID) | Status: DC
  Administered 2012-03-06 – 2012-03-07 (×4): 250 mL

## 2012-03-06 MED ORDER — FENTANYL CITRATE 0.05 MG/ML IJ SOLN
INTRAMUSCULAR | Status: AC
  Filled 2012-03-06: qty 5

## 2012-03-06 NOTE — Progress Notes (Signed)
Spoke to Dr. Juanetta Gosling about extubating the Pt and due to the Pt's BP, HR and drowsiness He said to work him today and we would try to wean to extubate tomorrow.

## 2012-03-06 NOTE — Progress Notes (Signed)
UR chart review completed.  

## 2012-03-06 NOTE — Progress Notes (Signed)
NUTRITION FOLLOW UP  Intervention:   Currently Osmolite 1.2 @ 30 ml/hr provides:864kcal, 40 gr protein (0.377 gr/kg), 590 ml water which meets 42% energy, 25% protein needs via TF.   Recommend increase protein to 0.8 gr/kg. Add ProStat 30 ml TID (300 kcal, 45 gr protein). Nutrition support including lipids will then provide: 1504 kcal, 85 gr protein which meets 73% energy and 53% protein needs.  Free water  250 ml q 6h.  Nutrition Dx:   Inadequate oral intake related to inability to eat as evidenced by NPO status.  Goal:   Enteral nutrition to provide 60-70% of estimated calorie needs (22-25 kcals/kg ideal body weight) and >/= 90% of estimated protein needs, based on ASPEN guidelines for permissive underfeeding in critically ill obese individuals. Goal not met.  Monitor:   Monitor vent status and TF rate and tolerance  Assessment:   Patient continues intubated on ventilator support. TF tolerated. Residuals 15 ml. Free water added 250 ml q 6 hr per MD.  MV: 8.1 ml  Temp:Temp (24hrs), Avg:99 F (37.2 C), Min:97.7 F (36.5 C), Max:100 F (37.8 C)  Propofol: 12.9 ml/hr caloric load-340 kcal lipids  Height: Ht Readings from Last 1 Encounters:  03/02/12 6' (1.829 m)    Weight Status:   Wt Readings from Last 1 Encounters:  03/06/12 234 lb 9.1 oz (106.4 kg)    Re-estimated needs:  Kcal: 2052 Protein: 162 gr Fluid: 1 ml/kcal  Skin: no issues noted  Diet Order: NPO   Intake/Output Summary (Last 24 hours) at 03/06/12 1456 Last data filed at 03/06/12 1400  Gross per 24 hour  Intake 4397.9 ml  Output   5105 ml  Net -707.1 ml    Last BM: 03/01/12   Labs:   Lab 03/06/12 0425 03/05/12 0329 03/04/12 0528  NA 145 140 138  K 3.8 3.9 3.9  CL 103 100 97  CO2 27 23 23   BUN 99* 96* 76*  CREATININE 4.85* 6.07* 6.20*  CALCIUM 7.8* 7.2* 7.7*  MG -- -- --  PHOS -- -- --  GLUCOSE 162* 172* 141*    CBG (last 3)   Basename 03/06/12 1113 03/06/12 0723 03/06/12 0359    GLUCAP 126* 190* 137*    Scheduled Meds:    . antiseptic oral rinse  15 mL Mouth Rinse QID  . azithromycin  500 mg Intravenous Q24H  . cefTRIAXone (ROCEPHIN)  IV  1 g Intravenous Q24H  . chlorhexidine  15 mL Mouth/Throat BID  . enoxaparin (LOVENOX) injection  30 mg Subcutaneous Q24H  . folic acid  1 mg Intravenous Daily  . free water  250 mL Per Tube Q6H  . furosemide  120 mg Intravenous BID  . ipratropium  0.5 mg Nebulization Q4H  . levalbuterol  0.63 mg Nebulization Q4H  . methylPREDNISolone (SOLU-MEDROL) injection  125 mg Intravenous Q6H  . metoprolol  2.5 mg Intravenous Q6H  . pantoprazole (PROTONIX) IV  40 mg Intravenous QHS  . sodium chloride  10-40 mL Intracatheter Q12H  . thiamine  100 mg Intravenous Daily  . vancomycin  1,500 mg Intravenous Q48H    Continuous Infusions:    . sodium chloride 10 mL/hr at 03/06/12 1400  . sodium chloride 125 mL/hr at 03/06/12 1400  . feeding supplement (OSMOLITE 1.2 CAL) 1,000 mL (03/04/12 1046)  . fentaNYL infusion INTRAVENOUS 50 mcg/hr (03/06/12 1400)  . propofol 20 mcg/kg/min (03/06/12 1400)    #[id number]

## 2012-03-06 NOTE — Progress Notes (Signed)
[name]  MRN: [id number]  DOB/AGE: 1956/01/18 56 y.o.  Primary Care Physician:PATTERSON, KATHY, FNP  Admit date: 02/29/2012  Chief Complaint:  Chief Complaint  Patient presents with  . Respiratory Distress    S-Pt presented on  02/29/2012 with  Chief Complaint  Patient presents with  . Respiratory Distress   Pt intubated but opens eyes and communicates . Pt asked for water.  Meds    . antiseptic oral rinse  15 mL Mouth Rinse QID  . azithromycin  500 mg Intravenous Q24H  . cefTRIAXone (ROCEPHIN)  IV  1 g Intravenous Q24H  . chlorhexidine  15 mL Mouth/Throat BID  . enoxaparin (LOVENOX) injection  30 mg Subcutaneous Q24H  . folic acid  1 mg Intravenous Daily  . furosemide  120 mg Intravenous BID  . ipratropium  0.5 mg Nebulization Q4H  . levalbuterol  0.63 mg Nebulization Q4H  . methylPREDNISolone (SOLU-MEDROL) injection  125 mg Intravenous Q6H  . metoprolol  2.5 mg Intravenous Q6H  . pantoprazole (PROTONIX) IV  40 mg Intravenous QHS  . sodium chloride  10-40 mL Intracatheter Q12H  . thiamine  100 mg Intravenous Daily  . vancomycin  1,500 mg Intravenous Q48H     Physical Exam: Vital signs in last 24 hours: Temp:  [97.7 F (36.5 C)-100 F (37.8 C)] 99.8 F (37.7 C) (12/27 1200) Pulse Rate:  [67-117] 99  (12/27 1200) Resp:  [10-30] 15  (12/27 1200) BP: (99-175)/(68-116) 156/97 mmHg (12/27 1200) SpO2:  [96 %-100 %] 98 % (12/27 1200) FiO2 (%):  [39.5 %-40.7 %] 40 % (12/27 1200) Weight:  [234 lb 9.1 oz (106.4 kg)] 234 lb 9.1 oz (106.4 kg) (12/27 0500) Weight change: -3 lb 15.5 oz (-1.8 kg) Last BM Date: 03/01/12  Intake/Output from previous day: 12/26 0701 - 12/27 0700 In: 4508.6 [I.V.:3496.6; NG/GT:140; IV Piggyback:872] Out: 5580 [Urine:5575; Emesis/NG output:5] Total I/O In: 866.6 [I.V.:722.6; NG/GT:20; IV Piggyback:124] Out: 925 [Urine:925]   Physical Exam: General- pt is awake,opens eyes Resp- intubated B/L Breath sounds + CVS- S1S2  regular in rate and rhythm GIT- BS+, soft, NT, ND EXT- Trace LE Edema, NO  Cyanosis   Lab Results: CBC  Basename 03/06/12 0425 03/05/12 0329  WBC 8.5 8.0  HGB 12.4* 11.5*  HCT 37.0* 33.8*  PLT 186 172    BMET  Basename 03/06/12 0425 03/05/12 0329  NA 145 140  K 3.8 3.9  CL 103 100  CO2 27 23  GLUCOSE 162* 172*  BUN 99* 96*  CREATININE 4.85* 6.07*  CALCIUM 7.8* 7.2*   Creat trend  2013 1.3==>2.1==>3.67 ==>5.87==>6.20==>6.0==>4.85 2012 1.2--1.6  2011 1.3--1.5  2010 1.3--1.4  2009 1.2--1.4   MICRO Recent Results (from the past 240 hour(s))  CULTURE, BLOOD (ROUTINE X 2)     Status: Normal   Collection Time   02/29/12 11:47 PM      Component Value Range Status Comment   Specimen Description BLOOD LEFT HAND   Final    Special Requests     Final    Value: BOTTLES DRAWN AEROBIC AND ANAEROBIC AEB 6CC ANA 4CC   Culture NO GROWTH 5 DAYS   Final    Report Status 03/06/2012 FINAL   Final   MRSA PCR SCREENING     Status: Abnormal   Collection Time   03/01/12  4:31 AM      Component Value Range Status Comment   MRSA by PCR INVALID RESULTS, SPECIMEN SENT FOR CULTURE (*) NEGATIVE Final   MRSA CULTURE  Status: Normal   Collection Time   03/01/12  4:31 AM      Component Value Range Status Comment   Specimen Description NOSE   Final    Special Requests NOSE   Final    Culture NOMRSA   Final    Report Status 03/03/2012 FINAL   Final   CULTURE, BLOOD (ROUTINE X 2)     Status: Normal   Collection Time   03/01/12  5:12 AM      Component Value Range Status Comment   Specimen Description BLOOD LEFT HAND   Final    Special Requests     Final    Value: BOTTLES DRAWN AEROBIC AND ANAEROBIC AEB 5CC ANA 6CC   Culture NO GROWTH 5 DAYS   Final    Report Status 03/06/2012 FINAL   Final        Impression: 1)Renal  AKi on CKD  AKI secondary to Prerenal / ATN  AKI secondary to sepsis + Hypotension + ACE ( as outpt)  Most Likely NON Oliguric ATN ATN now better NO need of  renal replacement therapy  CKD stage 3 .  CKD since 2009  CKD secondary to HTN   2)HTN BP at goal  3)Anemia HGb at goal (9--11)   4)ID- admitted with Influenza /sepsis On tamiflu  + ABX  5)Resp- Admitted with resp failure  intubated   6)FEN  Normokalemic  NOrmonatremic but on higher side.  7)Acid base  Bicarb better  8) Substance abuse-  Cocaine +, Hx of ETOH and Tobacco as well        Plan:  Will continue current tx and plan Will start free water intake  Will follow bmet      [name] 03/06/2012, 1:51 PM

## 2012-03-06 NOTE — Progress Notes (Signed)
ANTIBIOTIC CONSULT NOTE   Pharmacy Consult for Vancomycin Indication: pneumonia  Allergies  Allergen Reactions  . Bee Venom Anaphylaxis    Patient has an epi pen   Patient Measurements: Height: 6' (182.9 cm) Weight: 234 lb 9.1 oz (106.4 kg) IBW/kg (Calculated) : 77.6   Vital Signs: Temp: 98.5 F (36.9 C) (12/27 0400) Temp src: Axillary (12/27 0400) BP: 167/97 mmHg (12/27 0930) Pulse Rate: 115  (12/27 0930) Intake/Output from previous day: 12/26 0701 - 12/27 0700 In: 4508.6 [I.V.:3496.6; NG/GT:140; IV Piggyback:872] Out: 5580 [Urine:5575; Emesis/NG output:5] Intake/Output from this shift: Total I/O In: 289.9 [I.V.:145.9; NG/GT:20; IV Piggyback:124] Out: -   Labs:  Basename 03/06/12 0425 03/05/12 0329 03/04/12 0528  WBC 8.5 8.0 17.2*  HGB 12.4* 11.5* 13.4  PLT 186 172 234  LABCREA -- -- --  CREATININE 4.85* 6.07* 6.20*   Estimated Creatinine Clearance: 21.2 ml/min (by C-G formula based on Cr of 4.85). No results found for this basename: VANCOTROUGH:2,VANCOPEAK:2,VANCORANDOM:2,GENTTROUGH:2,GENTPEAK:2,GENTRANDOM:2,TOBRATROUGH:2,TOBRAPEAK:2,TOBRARND:2,AMIKACINPEAK:2,AMIKACINTROU:2,AMIKACIN:2, in the last 72 hours   Microbiology: Recent Results (from the past 720 hour(s))  CULTURE, BLOOD (ROUTINE X 2)     Status: Normal   Collection Time   02/29/12 11:47 PM      Component Value Range Status Comment   Specimen Description BLOOD LEFT HAND   Final    Special Requests     Final    Value: BOTTLES DRAWN AEROBIC AND ANAEROBIC AEB 6CC ANA 4CC   Culture NO GROWTH 5 DAYS   Final    Report Status 03/06/2012 FINAL   Final   MRSA PCR SCREENING     Status: Abnormal   Collection Time   03/01/12  4:31 AM      Component Value Range Status Comment   MRSA by PCR INVALID RESULTS, SPECIMEN SENT FOR CULTURE (*) NEGATIVE Final   MRSA CULTURE     Status: Normal   Collection Time   03/01/12  4:31 AM      Component Value Range Status Comment   Specimen Description NOSE   Final    Special Requests NOSE   Final    Culture [facility]   Final    Report Status 03/03/2012 FINAL   Final   CULTURE, BLOOD (ROUTINE X 2)     Status: Normal   Collection Time   03/01/12  5:12 AM      Component Value Range Status Comment   Specimen Description BLOOD LEFT HAND   Final    Special Requests     Final    Value: BOTTLES DRAWN AEROBIC AND ANAEROBIC AEB 5CC ANA 6CC   Culture NO GROWTH 5 DAYS   Final    Report Status 03/06/2012 FINAL   Final    Medical History: Past Medical History  Diagnosis Date  . Hypertension   . Dyspnea   . Chronic kidney disease, stage 2, mildly decreased GFR     Creatinine of 1.31 in 04/2011  . COPD (chronic obstructive pulmonary disease)     moderate by spirometry in 08/2009;FEV1 of 1.1 ;nl alpha -1 antitrypsin   . Obesity   . Psoriasis   . Left eye trauma     with loss of vision  . Erectile dysfunction   . Lumbosacral spinal stenosis     chronic low back pain  . Headache   . Sleep apnea   . Carotid artery aneurysm    Medications:  Scheduled:     . antiseptic oral rinse  15 mL Mouth Rinse QID  . azithromycin  500 mg Intravenous Q24H  . cefTRIAXone (ROCEPHIN)  IV  1 g Intravenous Q24H  . chlorhexidine  15 mL Mouth/Throat BID  . enoxaparin (LOVENOX) injection  30 mg Subcutaneous Q24H  . folic acid  1 mg Intravenous Daily  . furosemide  120 mg Intravenous BID  . ipratropium  0.5 mg Nebulization Q4H  . levalbuterol  0.63 mg Nebulization Q4H  . methylPREDNISolone (SOLU-MEDROL) injection  125 mg Intravenous Q6H  . metoprolol  2.5 mg Intravenous Q6H  . [COMPLETED] metoprolol  2.5 mg Intravenous Once  . [COMPLETED] oseltamivir  75 mg Per Tube BID  . pantoprazole (PROTONIX) IV  40 mg Intravenous QHS  . sodium chloride  10-40 mL Intracatheter Q12H  . thiamine  100 mg Intravenous Daily  . vancomycin  1,500 mg Intravenous Q48H   Assessment: 56yo morbidly obese male with poor renal fxn.  Intubated in ICU with respiratory failure.  Estimated Creatinine  Clearance: 21.2 ml/min (by C-G formula based on Cr of 4.85).  Goal of Therapy:  Vancomycin trough level 15-20 mcg/ml  Plan: Vancomycin 1500mg  IV q48hrs Check trough at steady state Monitor labs, renal fxn, and cultures per protocol Duration of therapy per MD  Valrie Hart A 03/06/2012,10:53 AM

## 2012-03-06 NOTE — Progress Notes (Signed)
Subjective: He's doing much better with weaning today. He will need an increase in his tube feedings. This will be if he's not able to be extubated  Objective: Vital signs in last 24 hours: Temp:  [97.7 F (36.5 C)-98.5 F (36.9 C)] 98.5 F (36.9 C) (12/27 0400) Pulse Rate:  [67-97] 82  (12/27 0600) Resp:  [10-20] 10  (12/27 0600) BP: (99-162)/(68-106) 146/93 mmHg (12/27 0600) SpO2:  [93 %-100 %] 96 % (12/27 0600) FiO2 (%):  [39.9 %-40.7 %] 40.1 % (12/27 0600) Weight:  [106.4 kg (234 lb 9.1 oz)] 106.4 kg (234 lb 9.1 oz) (12/27 0500) Weight change: -1.8 kg (-3 lb 15.5 oz) Last BM Date: 03/01/12  Intake/Output from previous day: 12/26 0701 - 12/27 0700 In: 4373.1 [I.V.:3361.1; NG/GT:140; IV Piggyback:872] Out: 5580 [Urine:5575; Emesis/NG output:5]  PHYSICAL EXAM General appearance: alert, cooperative, mild distress and moderately obese Resp: rhonchi bilaterally Cardio: regular rate and rhythm, S1, S2 normal, no murmur, click, rub or gallop GI: soft, non-tender; bowel sounds normal; no masses,  no organomegaly Extremities: extremities normal, atraumatic, no cyanosis or edema  Lab Results:    Basic Metabolic Panel:  Basename 03/06/12 0425 03/05/12 0329  NA 145 140  K 3.8 3.9  CL 103 100  CO2 27 23  GLUCOSE 162* 172*  BUN 99* 96*  CREATININE 4.85* 6.07*  CALCIUM 7.8* 7.2*  MG -- --  PHOS -- --   Liver Function Tests:  Basename 03/06/12 0425  AST 16  ALT 22  ALKPHOS 36*  BILITOT 0.2*  PROT 5.9*  ALBUMIN 2.8*   No results found for this basename: LIPASE:2,AMYLASE:2 in the last 72 hours No results found for this basename: AMMONIA:2 in the last 72 hours CBC:  Basename 03/06/12 0425 03/05/12 0329  WBC 8.5 8.0  NEUTROABS 7.6 7.6  HGB 12.4* 11.5*  HCT 37.0* 33.8*  MCV 91.8 91.8  PLT 186 172   Cardiac Enzymes: No results found for this basename: CKTOTAL:3,CKMB:3,CKMBINDEX:3,TROPONINI:3 in the last 72 hours BNP: No results found for this basename: PROBNP:3  in the last 72 hours D-Dimer: No results found for this basename: DDIMER:2 in the last 72 hours CBG:  Basename 03/06/12 0723 03/06/12 0359 03/05/12 2352 03/05/12 2002 03/05/12 1144 03/05/12 0824  GLUCAP 190* 137* 166* 162* 133* 113*   Hemoglobin A1C: No results found for this basename: HGBA1C in the last 72 hours Fasting Lipid Panel: No results found for this basename: CHOL,HDL,LDLCALC,TRIG,CHOLHDL,LDLDIRECT in the last 72 hours Thyroid Function Tests: No results found for this basename: TSH,T4TOTAL,FREET4,T3FREE,THYROIDAB in the last 72 hours Anemia Panel: No results found for this basename: VITAMINB12,FOLATE,FERRITIN,TIBC,IRON,RETICCTPCT in the last 72 hours Coagulation: No results found for this basename: LABPROT:2,INR:2 in the last 72 hours Urine Drug Screen: Drugs of Abuse     Component Value Date/Time   LABOPIA NONE DETECTED 03/01/2012 0019   COCAINSCRNUR POSITIVE* 03/01/2012 0019   LABBENZ NONE DETECTED 03/01/2012 0019   AMPHETMU NONE DETECTED 03/01/2012 0019   THCU NONE DETECTED 03/01/2012 0019   LABBARB NONE DETECTED 03/01/2012 0019    Alcohol Level: No results found for this basename: ETH:2 in the last 72 hours Urinalysis: No results found for this basename: COLORURINE:2,APPERANCEUR:2,LABSPEC:2,PHURINE:2,GLUCOSEU:2,HGBUR:2,BILIRUBINUR:2,KETONESUR:2,PROTEINUR:2,UROBILINOGEN:2,NITRITE:2,LEUKOCYTESUR:2 in the last 72 hours Misc. Labs:  ABGS  Basename 03/06/12 0455  PHART 7.310*  PO2ART 94.2  TCO2 24.6  HCO3 26.7*   CULTURES Recent Results (from the past 240 hour(s))  CULTURE, BLOOD (ROUTINE X 2)     Status: Normal   Collection Time   02/29/12 11:47 PM  Component Value Range Status Comment   Specimen Description BLOOD LEFT HAND   Final    Special Requests     Final    Value: BOTTLES DRAWN AEROBIC AND ANAEROBIC AEB 6CC ANA 4CC   Culture NO GROWTH 5 DAYS   Final    Report Status 03/06/2012 FINAL   Final   MRSA PCR SCREENING     Status: Abnormal    Collection Time   03/01/12  4:31 AM      Component Value Range Status Comment   MRSA by PCR INVALID RESULTS, SPECIMEN SENT FOR CULTURE (*) NEGATIVE Final   MRSA CULTURE     Status: Normal   Collection Time   03/01/12  4:31 AM      Component Value Range Status Comment   Specimen Description NOSE   Final    Special Requests NOSE   Final    Culture [facility]   Final    Report Status 03/03/2012 FINAL   Final   CULTURE, BLOOD (ROUTINE X 2)     Status: Normal   Collection Time   03/01/12  5:12 AM      Component Value Range Status Comment   Specimen Description BLOOD LEFT HAND   Final    Special Requests     Final    Value: BOTTLES DRAWN AEROBIC AND ANAEROBIC AEB 5CC ANA 6CC   Culture NO GROWTH 5 DAYS   Final    Report Status 03/06/2012 FINAL   Final    Studies/Results: Dg Chest Port 1 View  03/05/2012  *RADIOLOGY REPORT*  Clinical Data: Respiratory failure.  PORTABLE CHEST - 1 VIEW  Comparison: Chest x-ray 03/04/2012.  Findings: An endotracheal tube is in place with tip 5.3 cm above the carina. A nasogastric tube is seen extending into the stomach, however, the tip of the nasogastric tube extends below the lower margin of the image.  There is a left upper extremity PICC with tip terminating in the mid superior vena cava. No definite acute consolidated air space disease.  Probable small right pleural effusion.  Pulmonary vasculature is normal.  Heart size is within normal limits. The patient is rotated to the right on today's exam, resulting in distortion of the mediastinal contours and reduced diagnostic sensitivity and specificity for mediastinal pathology. Atherosclerosis in the thoracic aorta.  IMPRESSION: 1.  Support apparatus, as above. 2.  Small right pleural effusion. 3.  Atherosclerosis.   Original Report Authenticated By: Trudie Reed, M.D.     Medications:  Prior to Admission:  Prescriptions prior to admission  Medication Sig Dispense Refill  . albuterol (VENTOLIN HFA) 108 (90  BASE) MCG/ACT inhaler Inhale 2 puffs into the lungs every 4 (four) hours as needed. For shortness of breath      . cetirizine (ZYRTEC) 10 MG tablet Take 1 tablet (10 mg total) by mouth daily.      . cloNIDine (CATAPRES) 0.2 MG tablet Take 0.2 mg by mouth 2 (two) times daily.      . Fluticasone-Salmeterol (ADVAIR DISKUS) 250-50 MCG/DOSE AEPB Inhale 1 puff into the lungs every 12 (twelve) hours.        Marland Kitchen HYDROcodone-acetaminophen (NORCO) 5-325 MG per tablet Take 1 tablet by mouth every 6 (six) hours as needed for pain.  60 tablet  5  . ipratropium-albuterol (DUONEB) 0.5-2.5 (3) MG/3ML SOLN Take 3 mLs by nebulization 2 (two) times daily as needed. For shortness of breath      . lisinopril (PRINIVIL,ZESTRIL) 40 MG tablet Take 40 mg  by mouth daily.      . methotrexate (RHEUMATREX) 2.5 MG tablet Take 10 mg by mouth once a week.      . predniSONE (DELTASONE) 10 MG tablet Take 10 mg by mouth daily.      . ranitidine (ZANTAC) 300 MG tablet Take 300 mg by mouth at bedtime.      . Vitamin D, Ergocalciferol, (DRISDOL) 50000 UNITS CAPS Take 50,000 Units by mouth 2 (two) times a week.       Scheduled:   . antiseptic oral rinse  15 mL Mouth Rinse QID  . azithromycin  500 mg Intravenous Q24H  . cefTRIAXone (ROCEPHIN)  IV  1 g Intravenous Q24H  . chlorhexidine  15 mL Mouth/Throat BID  . enoxaparin (LOVENOX) injection  30 mg Subcutaneous Q24H  . folic acid  1 mg Intravenous Daily  . furosemide  120 mg Intravenous BID  . ipratropium  0.5 mg Nebulization Q4H  . levalbuterol  0.63 mg Nebulization Q4H  . methylPREDNISolone (SOLU-MEDROL) injection  125 mg Intravenous Q6H  . metoprolol  2.5 mg Intravenous Q6H  . oseltamivir  75 mg Per Tube BID  . pantoprazole (PROTONIX) IV  40 mg Intravenous QHS  . sodium chloride  10-40 mL Intracatheter Q12H  . thiamine  100 mg Intravenous Daily  . vancomycin  1,500 mg Intravenous Q48H   Continuous:   . sodium chloride 10 mL/hr at 03/06/12 0600  . sodium chloride 125  mL/hr at 03/06/12 0600  . feeding supplement (OSMOLITE 1.2 CAL) 1,000 mL (03/04/12 1046)  . fentaNYL infusion INTRAVENOUS 5 mcg/hr (03/06/12 0600)  . propofol 15 mcg/kg/min (03/06/12 0723)   ZOX:WRUEAV chloride, acetaminophen (TYLENOL) oral liquid 160 mg/5 mL, fentaNYL, levalbuterol, LORazepam, metoprolol, sodium chloride  Assesment: He has acute respiratory failure. He has acute on chronic renal failure. His renal function is improved. He's doing better with weaning today. Nutritional followup is noted and I agree he needs an increase in his tube feeding rate if he does not come off the ventilator. I think he may be able to be extubated later today. Principal Problem:  *Respiratory failure, acute Active Problems:  CHRONIC OBSTRUCTIVE PULMONARY DISEASE  Chronic kidney disease, stage 2, mildly decreased GFR  COPD exacerbation  Sinus tachycardia  Substance abuse  Acute on chronic renal failure  Influenza A (H1N1)  Community acquired pneumonia    Plan: As above    LOS: 6 days   Sabatino Williard L 03/06/2012, 8:04 AM

## 2012-03-07 LAB — BLOOD GAS, ARTERIAL
PIP: 5 cmH2O
Pressure support: 5 cmH2O
pCO2 arterial: 52.4 mmHg — ABNORMAL HIGH (ref 35.0–45.0)
pH, Arterial: 7.387 (ref 7.350–7.450)
pO2, Arterial: 91.6 mmHg (ref 80.0–100.0)

## 2012-03-07 LAB — GLUCOSE, CAPILLARY
Glucose-Capillary: 133 mg/dL — ABNORMAL HIGH (ref 70–99)
Glucose-Capillary: 138 mg/dL — ABNORMAL HIGH (ref 70–99)
Glucose-Capillary: 170 mg/dL — ABNORMAL HIGH (ref 70–99)

## 2012-03-07 LAB — EXPECTORATED SPUTUM ASSESSMENT W GRAM STAIN, RFLX TO RESP C: Special Requests: NORMAL

## 2012-03-07 MED ORDER — OSMOLITE 1.2 CAL PO LIQD
1000.0000 mL | ORAL | Status: DC
  Filled 2012-03-07 (×3): qty 1000

## 2012-03-07 NOTE — Progress Notes (Signed)
Subjective: He is in the process of weaning. His sedation has been turned off and he is responsive. His weaning criteria yesterday were okay but he was not totally cooperative. His blood gas still showed significant respiratory acidemia.  Objective: Vital signs in last 24 hours: Temp:  [97.6 F (36.4 C)-100 F (37.8 C)] 97.6 F (36.4 C) (12/27 2000) Pulse Rate:  [77-117] 101  (12/28 0400) Resp:  [11-30] 18  (12/28 0600) BP: (111-184)/(65-116) 149/87 mmHg (12/28 0600) SpO2:  [96 %-100 %] 97 % (12/28 0400) FiO2 (%):  [39.5 %-40.4 %] 39.9 % (12/28 0600) Weight:  [104.8 kg (231 lb 0.7 oz)] 104.8 kg (231 lb 0.7 oz) (12/28 0500) Weight change: -1.6 kg (-3 lb 8.4 oz) Last BM Date: 03/01/12  Intake/Output from previous day: 12/27 0701 - 12/28 0700 In: 5801.3 [I.V.:3625.3; NG/GT:1690; IV Piggyback:486] Out: 4775 [Urine:4775]  PHYSICAL EXAM General appearance: alert and mild distress Resp: rhonchi bilaterally Cardio: regular rate and rhythm, S1, S2 normal, no murmur, click, rub or gallop GI: soft, non-tender; bowel sounds normal; no masses,  no organomegaly Extremities: extremities normal, atraumatic, no cyanosis or edema  Lab Results:    Basic Metabolic Panel:  Basename 03/06/12 0425 03/05/12 0329  NA 145 140  K 3.8 3.9  CL 103 100  CO2 27 23  GLUCOSE 162* 172*  BUN 99* 96*  CREATININE 4.85* 6.07*  CALCIUM 7.8* 7.2*  MG -- --  PHOS -- --   Liver Function Tests:  Basename 03/06/12 0425  AST 16  ALT 22  ALKPHOS 36*  BILITOT 0.2*  PROT 5.9*  ALBUMIN 2.8*   No results found for this basename: LIPASE:2,AMYLASE:2 in the last 72 hours No results found for this basename: AMMONIA:2 in the last 72 hours CBC:  Basename 03/06/12 0425 03/05/12 0329  WBC 8.5 8.0  NEUTROABS 7.6 7.6  HGB 12.4* 11.5*  HCT 37.0* 33.8*  MCV 91.8 91.8  PLT 186 172   Cardiac Enzymes: No results found for this basename: CKTOTAL:3,CKMB:3,CKMBINDEX:3,TROPONINI:3 in the last 72 hours BNP: No  results found for this basename: PROBNP:3 in the last 72 hours D-Dimer: No results found for this basename: DDIMER:2 in the last 72 hours CBG:  Basename 03/07/12 0722 03/07/12 0337 03/06/12 2355 03/06/12 2048 03/06/12 1557 03/06/12 1113  GLUCAP 180* 167* 170* 139* 154* 126*   Hemoglobin A1C: No results found for this basename: HGBA1C in the last 72 hours Fasting Lipid Panel: No results found for this basename: CHOL,HDL,LDLCALC,TRIG,CHOLHDL,LDLDIRECT in the last 72 hours Thyroid Function Tests: No results found for this basename: TSH,T4TOTAL,FREET4,T3FREE,THYROIDAB in the last 72 hours Anemia Panel: No results found for this basename: VITAMINB12,FOLATE,FERRITIN,TIBC,IRON,RETICCTPCT in the last 72 hours Coagulation: No results found for this basename: LABPROT:2,INR:2 in the last 72 hours Urine Drug Screen: Drugs of Abuse     Component Value Date/Time   LABOPIA NONE DETECTED 03/01/2012 0019   COCAINSCRNUR POSITIVE* 03/01/2012 0019   LABBENZ NONE DETECTED 03/01/2012 0019   AMPHETMU NONE DETECTED 03/01/2012 0019   THCU NONE DETECTED 03/01/2012 0019   LABBARB NONE DETECTED 03/01/2012 0019    Alcohol Level: No results found for this basename: ETH:2 in the last 72 hours Urinalysis: No results found for this basename: COLORURINE:2,APPERANCEUR:2,LABSPEC:2,PHURINE:2,GLUCOSEU:2,HGBUR:2,BILIRUBINUR:2,KETONESUR:2,PROTEINUR:2,UROBILINOGEN:2,NITRITE:2,LEUKOCYTESUR:2 in the last 72 hours Misc. Labs:  ABGS  Basename 03/06/12 0900  PHART 7.295*  PO2ART 86.1  TCO2 23.8  HCO3 26.7*   CULTURES Recent Results (from the past 240 hour(s))  CULTURE, BLOOD (ROUTINE X 2)     Status: Normal   Collection  Time   02/29/12 11:47 PM      Component Value Range Status Comment   Specimen Description BLOOD LEFT HAND   Final    Special Requests     Final    Value: BOTTLES DRAWN AEROBIC AND ANAEROBIC AEB 6CC ANA 4CC   Culture NO GROWTH 5 DAYS   Final    Report Status 03/06/2012 FINAL   Final   MRSA PCR  SCREENING     Status: Abnormal   Collection Time   03/01/12  4:31 AM      Component Value Range Status Comment   MRSA by PCR INVALID RESULTS, SPECIMEN SENT FOR CULTURE (*) NEGATIVE Final   MRSA CULTURE     Status: Normal   Collection Time   03/01/12  4:31 AM      Component Value Range Status Comment   Specimen Description NOSE   Final    Special Requests NOSE   Final    Culture [facility]   Final    Report Status 03/03/2012 FINAL   Final   CULTURE, BLOOD (ROUTINE X 2)     Status: Normal   Collection Time   03/01/12  5:12 AM      Component Value Range Status Comment   Specimen Description BLOOD LEFT HAND   Final    Special Requests     Final    Value: BOTTLES DRAWN AEROBIC AND ANAEROBIC AEB 5CC ANA 6CC   Culture NO GROWTH 5 DAYS   Final    Report Status 03/06/2012 FINAL   Final   CULTURE, EXPECTORATED SPUTUM-ASSESSMENT     Status: Normal   Collection Time   03/06/12 11:44 PM      Component Value Range Status Comment   Specimen Description SPU   Final    Special Requests Normal   Final    Sputum evaluation     Final    Value: THIS SPECIMEN IS ACCEPTABLE. RESPIRATORY CULTURE REPORT TO FOLLOW.   Report Status 03/07/2012 FINAL   Final    Studies/Results: Dg Chest Port 1 View  03/06/2012  *RADIOLOGY REPORT*  Clinical Data: Respiratory failure  PORTABLE CHEST - 1 VIEW  Comparison: Yesterday  Findings: Stable endotracheal tube, NG tube, left PICC.  Upper normal heart size.  Mild atelectasis at the right base stable. Lungs otherwise clear.  No pneumothorax.  IMPRESSION: Stable atelectasis at the right base.   Original Report Authenticated By: Jolaine Click, M.D.     Medications:  Prior to Admission:  Prescriptions prior to admission  Medication Sig Dispense Refill  . albuterol (VENTOLIN HFA) 108 (90 BASE) MCG/ACT inhaler Inhale 2 puffs into the lungs every 4 (four) hours as needed. For shortness of breath      . cetirizine (ZYRTEC) 10 MG tablet Take 1 tablet (10 mg total) by mouth daily.       . cloNIDine (CATAPRES) 0.2 MG tablet Take 0.2 mg by mouth 2 (two) times daily.      . Fluticasone-Salmeterol (ADVAIR DISKUS) 250-50 MCG/DOSE AEPB Inhale 1 puff into the lungs every 12 (twelve) hours.        Marland Kitchen HYDROcodone-acetaminophen (NORCO) 5-325 MG per tablet Take 1 tablet by mouth every 6 (six) hours as needed for pain.  60 tablet  5  . ipratropium-albuterol (DUONEB) 0.5-2.5 (3) MG/3ML SOLN Take 3 mLs by nebulization 2 (two) times daily as needed. For shortness of breath      . lisinopril (PRINIVIL,ZESTRIL) 40 MG tablet Take 40 mg by mouth daily.      Marland Kitchen  methotrexate (RHEUMATREX) 2.5 MG tablet Take 10 mg by mouth once a week.      . predniSONE (DELTASONE) 10 MG tablet Take 10 mg by mouth daily.      . ranitidine (ZANTAC) 300 MG tablet Take 300 mg by mouth at bedtime.      . Vitamin D, Ergocalciferol, (DRISDOL) 50000 UNITS CAPS Take 50,000 Units by mouth 2 (two) times a week.       Scheduled:   . antiseptic oral rinse  15 mL Mouth Rinse QID  . azithromycin  500 mg Intravenous Q24H  . cefTRIAXone (ROCEPHIN)  IV  1 g Intravenous Q24H  . chlorhexidine  15 mL Mouth/Throat BID  . enoxaparin (LOVENOX) injection  30 mg Subcutaneous Q24H  . folic acid  1 mg Intravenous Daily  . free water  250 mL Per Tube Q6H  . furosemide  120 mg Intravenous BID  . ipratropium  0.5 mg Nebulization Q4H  . levalbuterol  0.63 mg Nebulization Q4H  . methylPREDNISolone (SOLU-MEDROL) injection  125 mg Intravenous Q6H  . metoprolol  2.5 mg Intravenous Q6H  . pantoprazole (PROTONIX) IV  40 mg Intravenous QHS  . sodium chloride  10-40 mL Intracatheter Q12H  . thiamine  100 mg Intravenous Daily  . vancomycin  1,500 mg Intravenous Q48H   Continuous:   . sodium chloride 10 mL/hr at 03/07/12 4098  . sodium chloride 125 mL/hr at 03/07/12 1191  . feeding supplement (OSMOLITE 1.2 CAL) 1,000 mL (03/07/12 0109)  . fentaNYL infusion INTRAVENOUS Stopped (03/07/12 0435)  . propofol 5 mcg/kg/min (03/07/12 4782)    NFA:OZHYQM chloride, acetaminophen (TYLENOL) oral liquid 160 mg/5 mL, fentaNYL, levalbuterol, LORazepam, sodium chloride  Assesment: He has acute respiratory failure that seems to be at least partially because of COPD exacerbation pneumonia and influenza A. He is improving. He also has acute on chronic renal failure which is better. Principal Problem:  *Respiratory failure, acute Active Problems:  CHRONIC OBSTRUCTIVE PULMONARY DISEASE  Chronic kidney disease, stage 2, mildly decreased GFR  COPD exacerbation  Sinus tachycardia  Substance abuse  Acute on chronic renal failure  Influenza A (H1N1)  Community acquired pneumonia    Plan: Attempt weaning again today    LOS: 7 days   Arrayah Connors L 03/07/2012, 7:39 AM

## 2012-03-07 NOTE — Progress Notes (Signed)
[name]  MRN: [id number]  DOB/AGE: October 23, 1955 56 y.o.  Primary Care Physician:PATTERSON, KATHY, FNP  Admit date: 02/29/2012  Chief Complaint:  Chief Complaint  Patient presents with  . Respiratory Distress    [name]-Pt presented on  02/29/2012 with  Chief Complaint  Patient presents with  . Respiratory Distress   Pt extubated  and communicative-offers no new complaints. .    Meds    . antiseptic oral rinse  15 mL Mouth Rinse QID  . azithromycin  500 mg Intravenous Q24H  . cefTRIAXone (ROCEPHIN)  IV  1 g Intravenous Q24H  . chlorhexidine  15 mL Mouth/Throat BID  . enoxaparin (LOVENOX) injection  30 mg Subcutaneous Q24H  . folic acid  1 mg Intravenous Daily  . furosemide  120 mg Intravenous BID  . ipratropium  0.5 mg Nebulization Q4H  . levalbuterol  0.63 mg Nebulization Q4H  . methylPREDNISolone (SOLU-MEDROL) injection  125 mg Intravenous Q6H  . metoprolol  2.5 mg Intravenous Q6H  . pantoprazole (PROTONIX) IV  40 mg Intravenous QHS  . thiamine  100 mg Intravenous Daily  . vancomycin  1,500 mg Intravenous Q48H     Physical Exam: Vital signs in last 24 hours: Temp:  [97.6 F (36.4 C)-98.5 F (36.9 C)] 98 F (36.7 C) (12/28 1200) Pulse Rate:  [70-110] 94  (12/28 1400) Resp:  [11-26] 17  (12/28 1400) BP: (111-189)/(65-117) 163/113 mmHg (12/28 1400) SpO2:  [87 %-100 %] 98 % (12/28 1400) FiO2 (%):  [39.8 %-40.3 %] 40 % (12/28 0910) Weight:  [231 lb 0.7 oz (104.8 kg)] 231 lb 0.7 oz (104.8 kg) (12/28 0500) Weight change: -3 lb 8.4 oz (-1.6 kg) Last BM Date: 03/06/12  Intake/Output from previous day: 12/27 0701 - 12/28 0700 In: 5969 [I.V.:3763; NG/GT:1720; IV Piggyback:486] Out: 4775 [Urine:4775] Total I/O In: 2019.7 [P.O.:480; I.V.:947.7; NG/GT:30; IV Piggyback:562] Out: 2400 [Urine:2400]   Physical Exam: General- pt is awake,opens eyes Resp- rhonchi + , decreased Bs at bases. CVS- S1S2 regular in rate and rhythm GIT- BS+, soft, NT, ND EXT- Trace  LE Edema, NO  Cyanosis   Lab Results: CBC  Basename 03/06/12 0425 03/05/12 0329  WBC 8.5 8.0  HGB 12.4* 11.5*  HCT 37.0* 33.8*  PLT 186 172    BMET  Basename 03/06/12 0425 03/05/12 0329  NA 145 140  K 3.8 3.9  CL 103 100  CO2 27 23  GLUCOSE 162* 172*  BUN 99* 96*  CREATININE 4.85* 6.07*  CALCIUM 7.8* 7.2*   Creat trend  2013 1.3==>2.1==>3.67 ==>5.87==>6.20==>6.0==>4.85 2012 1.2--1.6  2011 1.3--1.5  2010 1.3--1.4  2009 1.2--1.4   MICRO Recent Results (from the past 240 hour([name]))  CULTURE, BLOOD (ROUTINE X 2)     Status: Normal   Collection Time   02/29/12 11:47 PM      Component Value Range Status Comment   Specimen Description BLOOD LEFT HAND   Final    Special Requests     Final    Value: BOTTLES DRAWN AEROBIC AND ANAEROBIC AEB 6CC ANA 4CC   Culture NO GROWTH 5 DAYS   Final    Report Status 03/06/2012 FINAL   Final   MRSA PCR SCREENING     Status: Abnormal   Collection Time   03/01/12  4:31 AM      Component Value Range Status Comment   MRSA by PCR INVALID RESULTS, SPECIMEN SENT FOR CULTURE (*) NEGATIVE Final   MRSA CULTURE     Status: Normal   Collection  Time   03/01/12  4:31 AM      Component Value Range Status Comment   Specimen Description NOSE   Final    Special Requests NOSE   Final    Culture [facility]   Final    Report Status 03/03/2012 FINAL   Final   CULTURE, BLOOD (ROUTINE X 2)     Status: Normal   Collection Time   03/01/12  5:12 AM      Component Value Range Status Comment   Specimen Description BLOOD LEFT HAND   Final    Special Requests     Final    Value: BOTTLES DRAWN AEROBIC AND ANAEROBIC AEB 5CC ANA 6CC   Culture NO GROWTH 5 DAYS   Final    Report Status 03/06/2012 FINAL   Final   CULTURE, EXPECTORATED SPUTUM-ASSESSMENT     Status: Normal   Collection Time   03/06/12 11:44 PM      Component Value Range Status Comment   Specimen Description SPU   Final    Special Requests Normal   Final    Sputum evaluation     Final    Value:  THIS SPECIMEN IS ACCEPTABLE. RESPIRATORY CULTURE REPORT TO FOLLOW.   Report Status 03/07/2012 FINAL   Final        Impression: 1)Renal  AKi on CKD  AKI secondary to Prerenal / ATN  AKI secondary to sepsis + Hypotension + ACE ( as outpt)  Most Likely NON Oliguric ATN ATN improving NO need of renal replacement therapy  CKD stage 3 .  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

## 2012-03-07 NOTE — Progress Notes (Signed)
Patient extubated to 40% Venturi mask. SBT of 5/5 with 40% was successfully completed, with all vitals and ventilator parameters WNL's. Patient O2 will be titrated.

## 2012-03-08 LAB — BASIC METABOLIC PANEL
CO2: 36 mEq/L — ABNORMAL HIGH (ref 19–32)
Calcium: 8.3 mg/dL — ABNORMAL LOW (ref 8.4–10.5)
Glucose, Bld: 164 mg/dL — ABNORMAL HIGH (ref 70–99)
Sodium: 151 mEq/L — ABNORMAL HIGH (ref 135–145)

## 2012-03-08 LAB — BLOOD GAS, ARTERIAL
Acid-Base Excess: 9.6 mmol/L — ABNORMAL HIGH (ref 0.0–2.0)
O2 Saturation: 91.8 %
TCO2: 30.7 mmol/L (ref 0–100)
pCO2 arterial: 54.6 mmHg — ABNORMAL HIGH (ref 35.0–45.0)

## 2012-03-08 LAB — GLUCOSE, CAPILLARY
Glucose-Capillary: 124 mg/dL — ABNORMAL HIGH (ref 70–99)
Glucose-Capillary: 138 mg/dL — ABNORMAL HIGH (ref 70–99)
Glucose-Capillary: 142 mg/dL — ABNORMAL HIGH (ref 70–99)
Glucose-Capillary: 145 mg/dL — ABNORMAL HIGH (ref 70–99)
Glucose-Capillary: 151 mg/dL — ABNORMAL HIGH (ref 70–99)

## 2012-03-08 MED ORDER — SODIUM CHLORIDE 0.45 % IV SOLN
INTRAVENOUS | Status: DC
  Administered 2012-03-08: 13:00:00 via INTRAVENOUS

## 2012-03-08 NOTE — Progress Notes (Addendum)
[name]  MRN: [id number]  DOB/AGE: 57-Aug-1957 57 y.o.  Primary Care Physician:PATTERSON, KATHY, FNP  Admit date: 02/29/2012  Chief Complaint:  Chief Complaint  Patient presents with  . Respiratory Distress    S-Pt presented on  02/29/2012 with  Chief Complaint  Patient presents with  . Respiratory Distress   Pt offers no new complaints. Pt feels better.    Meds    . antiseptic oral rinse  15 mL Mouth Rinse QID  . azithromycin  500 mg Intravenous Q24H  . cefTRIAXone (ROCEPHIN)  IV  1 g Intravenous Q24H  . enoxaparin (LOVENOX) injection  30 mg Subcutaneous Q24H  . folic acid  1 mg Intravenous Daily  . furosemide  120 mg Intravenous BID  . ipratropium  0.5 mg Nebulization Q4H  . levalbuterol  0.63 mg Nebulization Q4H  . methylPREDNISolone (SOLU-MEDROL) injection  125 mg Intravenous Q6H  . metoprolol  2.5 mg Intravenous Q6H  . pantoprazole (PROTONIX) IV  40 mg Intravenous QHS  . thiamine  100 mg Intravenous Daily  . vancomycin  1,500 mg Intravenous Q48H     Physical Exam: Vital signs in last 24 hours: Temp:  [97.7 F (36.5 C)-98.9 F (37.2 C)] 98.9 F (37.2 C) (12/29 0800) Pulse Rate:  [59-100] 74  (12/29 0800) Resp:  [9-26] 13  (12/29 0800) BP: (142-189)/(84-113) 159/90 mmHg (12/29 0800) SpO2:  [84 %-100 %] 97 % (12/29 1138) Weight:  [233 lb 0.4 oz (105.7 kg)] 233 lb 0.4 oz (105.7 kg) (12/29 0405) Weight change: 1 lb 15.8 oz (0.9 kg) Last BM Date: 03/07/12  Intake/Output from previous day: 12/28 0701 - 12/29 0700 In: 5186.2 [P.O.:1260; I.V.:2984.2; NG/GT:30; IV Piggyback:912] Out: 6975 [Urine:6975] Total I/O In: 797 [P.O.:600; I.V.:135; IV Piggyback:62] Out: 375 [Urine:375]   Physical Exam: General- pt is awake,opens eyes,follows commands Resp- rhonchi + , decreased Bs at bases. CVS- S1S2 regular in rate and rhythm GIT- BS+, soft, NT, ND EXT- Trace LE Edema, NO  Cyanosis   Lab Results: CBC  Basename 03/06/12 0425  WBC 8.5  HGB  12.4*  HCT 37.0*  PLT 186    BMET  Basename 03/08/12 0452 03/06/12 0425  NA 151* 145  K 3.5 3.8  CL 106 103  CO2 36* 27  GLUCOSE 164* 162*  BUN 79* 99*  CREATININE 2.63* 4.85*  CALCIUM 8.3* 7.8*   Creat trend  2013 1.3==>2.1==>3.67 ==>5.87==>6.20==>6.0==>4.85==>2.63 2012 1.2--1.6  2011 1.3--1.5  2010 1.3--1.4  2009 1.2--1.4   MICRO Recent Results (from the past 240 hour(s))  CULTURE, BLOOD (ROUTINE X 2)     Status: Normal   Collection Time   02/29/12 11:47 PM      Component Value Range Status Comment   Specimen Description BLOOD LEFT HAND   Final    Special Requests     Final    Value: BOTTLES DRAWN AEROBIC AND ANAEROBIC AEB 6CC ANA 4CC   Culture NO GROWTH 5 DAYS   Final    Report Status 03/06/2012 FINAL   Final   MRSA PCR SCREENING     Status: Abnormal   Collection Time   03/01/12  4:31 AM      Component Value Range Status Comment   MRSA by PCR INVALID RESULTS, SPECIMEN SENT FOR CULTURE (*) NEGATIVE Final   MRSA CULTURE     Status: Normal   Collection Time   03/01/12  4:31 AM      Component Value Range Status Comment   Specimen Description NOSE  Final    Special Requests NOSE   Final    Culture [facility]   Final    Report Status 03/03/2012 FINAL   Final   CULTURE, BLOOD (ROUTINE X 2)     Status: Normal   Collection Time   03/01/12  5:12 AM      Component Value Range Status Comment   Specimen Description BLOOD LEFT HAND   Final    Special Requests     Final    Value: BOTTLES DRAWN AEROBIC AND ANAEROBIC AEB 5CC ANA 6CC   Culture NO GROWTH 5 DAYS   Final    Report Status 03/06/2012 FINAL   Final   CULTURE, EXPECTORATED SPUTUM-ASSESSMENT     Status: Normal   Collection Time   03/06/12 11:44 PM      Component Value Range Status Comment   Specimen Description SPU   Final    Special Requests Normal   Final    Sputum evaluation     Final    Value: THIS SPECIMEN IS ACCEPTABLE. RESPIRATORY CULTURE REPORT TO FOLLOW.   Report Status 03/07/2012 FINAL   Final     CULTURE, RESPIRATORY     Status: Normal (Preliminary result)   Collection Time   03/06/12 11:44 PM      Component Value Range Status Comment   Specimen Description SPUTUM   Final    Special Requests NONE   Final    Gram Stain     Final    Value: FEW WBC PRESENT, PREDOMINANTLY PMN     NO SQUAMOUS EPITHELIAL CELLS SEEN     NO ORGANISMS SEEN   Culture FEW CANDIDA ALBICANS   Final    Report Status PENDING   Incomplete        Impression: 1)Renal  AKi on CKD  AKI secondary to Prerenal / ATN  AKI secondary to sepsis + Hypotension + ACE ( as outpt)  Most Likely NON Oliguric ATN ATN improving Creat trending towards his baseline.  CKD stage 3 .  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

## 2012-03-08 NOTE — Progress Notes (Signed)
Subjective: He was able to be successfully extubated yesterday. He is awake and alert and looks comfortable. He has no new complaints. He is somewhat short of breath.  Objective: Vital signs in last 24 hours: Temp:  [97.7 F (36.5 C)-98.9 F (37.2 C)] 98.9 F (37.2 C) (12/29 0800) Pulse Rate:  [59-109] 74  (12/29 0800) Resp:  [9-26] 13  (12/29 0800) BP: (142-189)/(84-117) 159/90 mmHg (12/29 0800) SpO2:  [84 %-100 %] 97 % (12/29 0836) Weight:  [105.7 kg (233 lb 0.4 oz)] 105.7 kg (233 lb 0.4 oz) (12/29 0405) Weight change: 0.9 kg (1 lb 15.8 oz) Last BM Date: 03/07/12  Intake/Output from previous day: 12/28 0701 - 12/29 0700 In: 5186.2 [P.O.:1260; I.V.:2984.2; NG/GT:30; IV Piggyback:912] Out: 6975 [Urine:6975]  PHYSICAL EXAM General appearance: alert, cooperative and no distress Resp: clear to auscultation bilaterally Cardio: regular rate and rhythm, S1, S2 normal, no murmur, click, rub or gallop GI: soft, non-tender; bowel sounds normal; no masses,  no organomegaly Extremities: extremities normal, atraumatic, no cyanosis or edema  Lab Results:    Basic Metabolic Panel:  Basename 03/08/12 0452 03/06/12 0425  NA 151* 145  K 3.5 3.8  CL 106 103  CO2 36* 27  GLUCOSE 164* 162*  BUN 79* 99*  CREATININE 2.63* 4.85*  CALCIUM 8.3* 7.8*  MG -- --  PHOS -- --   Liver Function Tests:  Basename 03/06/12 0425  AST 16  ALT 22  ALKPHOS 36*  BILITOT 0.2*  PROT 5.9*  ALBUMIN 2.8*   No results found for this basename: LIPASE:2,AMYLASE:2 in the last 72 hours No results found for this basename: AMMONIA:2 in the last 72 hours CBC:  Basename 03/06/12 0425  WBC 8.5  NEUTROABS 7.6  HGB 12.4*  HCT 37.0*  MCV 91.8  PLT 186   Cardiac Enzymes: No results found for this basename: CKTOTAL:3,CKMB:3,CKMBINDEX:3,TROPONINI:3 in the last 72 hours BNP: No results found for this basename: PROBNP:3 in the last 72 hours D-Dimer: No results found for this basename: DDIMER:2 in the last  72 hours CBG:  Basename 03/08/12 0407 03/08/12 0002 03/07/12 2005 03/07/12 1603 03/07/12 1121 03/07/12 0722  GLUCAP 145* 142* 161* 133* 138* 180*   Hemoglobin A1C: No results found for this basename: HGBA1C in the last 72 hours Fasting Lipid Panel: No results found for this basename: CHOL,HDL,LDLCALC,TRIG,CHOLHDL,LDLDIRECT in the last 72 hours Thyroid Function Tests: No results found for this basename: TSH,T4TOTAL,FREET4,T3FREE,THYROIDAB in the last 72 hours Anemia Panel: No results found for this basename: VITAMINB12,FOLATE,FERRITIN,TIBC,IRON,RETICCTPCT in the last 72 hours Coagulation: No results found for this basename: LABPROT:2,INR:2 in the last 72 hours Urine Drug Screen: Drugs of Abuse     Component Value Date/Time   LABOPIA NONE DETECTED 03/01/2012 0019   COCAINSCRNUR POSITIVE* 03/01/2012 0019   LABBENZ NONE DETECTED 03/01/2012 0019   AMPHETMU NONE DETECTED 03/01/2012 0019   THCU NONE DETECTED 03/01/2012 0019   LABBARB NONE DETECTED 03/01/2012 0019    Alcohol Level: No results found for this basename: ETH:2 in the last 72 hours Urinalysis: No results found for this basename: COLORURINE:2,APPERANCEUR:2,LABSPEC:2,PHURINE:2,GLUCOSEU:2,HGBUR:2,BILIRUBINUR:2,KETONESUR:2,PROTEINUR:2,UROBILINOGEN:2,NITRITE:2,LEUKOCYTESUR:2 in the last 72 hours Misc. Labs:  ABGS  Basename 03/08/12 0555  PHART 7.416  PO2ART 64.6*  TCO2 30.7  HCO3 34.4*   CULTURES Recent Results (from the past 240 hour(s))  CULTURE, BLOOD (ROUTINE X 2)     Status: Normal   Collection Time   02/29/12 11:47 PM      Component Value Range Status Comment   Specimen Description BLOOD LEFT HAND  Final    Special Requests     Final    Value: BOTTLES DRAWN AEROBIC AND ANAEROBIC AEB 6CC ANA 4CC   Culture NO GROWTH 5 DAYS   Final    Report Status 03/06/2012 FINAL   Final   MRSA PCR SCREENING     Status: Abnormal   Collection Time   03/01/12  4:31 AM      Component Value Range Status Comment   MRSA by PCR  INVALID RESULTS, SPECIMEN SENT FOR CULTURE (*) NEGATIVE Final   MRSA CULTURE     Status: Normal   Collection Time   03/01/12  4:31 AM      Component Value Range Status Comment   Specimen Description NOSE   Final    Special Requests NOSE   Final    Culture [facility]   Final    Report Status 03/03/2012 FINAL   Final   CULTURE, BLOOD (ROUTINE X 2)     Status: Normal   Collection Time   03/01/12  5:12 AM      Component Value Range Status Comment   Specimen Description BLOOD LEFT HAND   Final    Special Requests     Final    Value: BOTTLES DRAWN AEROBIC AND ANAEROBIC AEB 5CC ANA 6CC   Culture NO GROWTH 5 DAYS   Final    Report Status 03/06/2012 FINAL   Final   CULTURE, EXPECTORATED SPUTUM-ASSESSMENT     Status: Normal   Collection Time   03/06/12 11:44 PM      Component Value Range Status Comment   Specimen Description SPU   Final    Special Requests Normal   Final    Sputum evaluation     Final    Value: THIS SPECIMEN IS ACCEPTABLE. RESPIRATORY CULTURE REPORT TO FOLLOW.   Report Status 03/07/2012 FINAL   Final    Studies/Results: No results found.  Medications:  Prior to Admission:  Prescriptions prior to admission  Medication Sig Dispense Refill  . albuterol (VENTOLIN HFA) 108 (90 BASE) MCG/ACT inhaler Inhale 2 puffs into the lungs every 4 (four) hours as needed. For shortness of breath      . cetirizine (ZYRTEC) 10 MG tablet Take 1 tablet (10 mg total) by mouth daily.      . cloNIDine (CATAPRES) 0.2 MG tablet Take 0.2 mg by mouth 2 (two) times daily.      . Fluticasone-Salmeterol (ADVAIR DISKUS) 250-50 MCG/DOSE AEPB Inhale 1 puff into the lungs every 12 (twelve) hours.        Marland Kitchen HYDROcodone-acetaminophen (NORCO) 5-325 MG per tablet Take 1 tablet by mouth every 6 (six) hours as needed for pain.  60 tablet  5  . ipratropium-albuterol (DUONEB) 0.5-2.5 (3) MG/3ML SOLN Take 3 mLs by nebulization 2 (two) times daily as needed. For shortness of breath      . lisinopril (PRINIVIL,ZESTRIL)  40 MG tablet Take 40 mg by mouth daily.      . methotrexate (RHEUMATREX) 2.5 MG tablet Take 10 mg by mouth once a week.      . predniSONE (DELTASONE) 10 MG tablet Take 10 mg by mouth daily.      . ranitidine (ZANTAC) 300 MG tablet Take 300 mg by mouth at bedtime.      . Vitamin D, Ergocalciferol, (DRISDOL) 50000 UNITS CAPS Take 50,000 Units by mouth 2 (two) times a week.       Scheduled:   . antiseptic oral rinse  15 mL Mouth Rinse QID  .  azithromycin  500 mg Intravenous Q24H  . cefTRIAXone (ROCEPHIN)  IV  1 g Intravenous Q24H  . enoxaparin (LOVENOX) injection  30 mg Subcutaneous Q24H  . folic acid  1 mg Intravenous Daily  . furosemide  120 mg Intravenous BID  . ipratropium  0.5 mg Nebulization Q4H  . levalbuterol  0.63 mg Nebulization Q4H  . methylPREDNISolone (SOLU-MEDROL) injection  125 mg Intravenous Q6H  . metoprolol  2.5 mg Intravenous Q6H  . pantoprazole (PROTONIX) IV  40 mg Intravenous QHS  . thiamine  100 mg Intravenous Daily  . vancomycin  1,500 mg Intravenous Q48H   Continuous:   . sodium chloride 10 mL/hr at 03/08/12 0800  . sodium chloride 125 mL/hr at 03/08/12 0800   BMW:UXLKGMWNUUVOZ (TYLENOL) oral liquid 160 mg/5 mL, fentaNYL, levalbuterol, LORazepam, sodium chloride  Assesment: he was admitted with acute respiratory failure. He has COPD and had acute exacerbation had what appeared to be pneumonia at least on some of his x-rays had influenza A. he has acute on chronic renal failure  Principal Problem:  *Respiratory failure, acute Active Problems:  CHRONIC OBSTRUCTIVE PULMONARY DISEASE  Chronic kidney disease, stage 2, mildly decreased GFR  COPD exacerbation  Sinus tachycardia  Substance abuse  Acute on chronic renal failure  Influenza A (H1N1)  Community acquired pneumonia    Plan: Advance his diet. Continue other treatments. I will leave him in step down today     LOS: 8 days   Evagelia Knack L 03/08/2012, 9:26 AM

## 2012-03-09 LAB — VANCOMYCIN, TROUGH: Vancomycin Tr: 7.8 ug/mL — ABNORMAL LOW (ref 10.0–20.0)

## 2012-03-09 LAB — CULTURE, RESPIRATORY W GRAM STAIN

## 2012-03-09 LAB — GLUCOSE, CAPILLARY
Glucose-Capillary: 133 mg/dL — ABNORMAL HIGH (ref 70–99)
Glucose-Capillary: 147 mg/dL — ABNORMAL HIGH (ref 70–99)

## 2012-03-09 LAB — BASIC METABOLIC PANEL
BUN: 63 mg/dL — ABNORMAL HIGH (ref 6–23)
Chloride: 92 mEq/L — ABNORMAL LOW (ref 96–112)
GFR calc Af Amer: 45 mL/min — ABNORMAL LOW (ref >90)
GFR calc non Af Amer: 39 mL/min — ABNORMAL LOW (ref >90)
Potassium: 3.3 mEq/L — ABNORMAL LOW (ref 3.5–5.1)
Sodium: 140 mEq/L (ref 135–145)

## 2012-03-09 MED ORDER — FUROSEMIDE 10 MG/ML IJ SOLN
60.0000 mg | Freq: Two times a day (BID) | INTRAMUSCULAR | Status: DC
  Administered 2012-03-09 – 2012-03-10 (×2): 60 mg via INTRAVENOUS
  Filled 2012-03-09 (×2): qty 6

## 2012-03-09 MED ORDER — GLUCERNA SHAKE PO LIQD
237.0000 mL | Freq: Two times a day (BID) | ORAL | Status: DC
  Administered 2012-03-09 – 2012-03-12 (×6): 237 mL via ORAL

## 2012-03-09 MED ORDER — SODIUM CHLORIDE 0.45 % IV SOLN
INTRAVENOUS | Status: DC
  Administered 2012-03-09 – 2012-03-10 (×2): via INTRAVENOUS
  Filled 2012-03-09 (×7): qty 1000

## 2012-03-09 MED ORDER — VANCOMYCIN HCL 10 G IV SOLR
1500.0000 mg | INTRAVENOUS | Status: DC
  Administered 2012-03-10 – 2012-03-12 (×3): 1500 mg via INTRAVENOUS
  Filled 2012-03-09 (×5): qty 1500

## 2012-03-09 NOTE — Progress Notes (Signed)
[name]  MRN: [id number]  DOB/AGE: 57-Nov-1957 57 y.o.  Primary Care Physician:PATTERSON, KATHY, FNP  Admit date: 02/29/2012  Chief Complaint:  Chief Complaint  Patient presents with  . Respiratory Distress    [name]-Pt presented on  02/29/2012 with  Chief Complaint  Patient presents with  . Respiratory Distress   Pt offers no new complaints. Pt feels better.    Meds    . antiseptic oral rinse  15 mL Mouth Rinse QID  . azithromycin  500 mg Intravenous Q24H  . cefTRIAXone (ROCEPHIN)  IV  1 g Intravenous Q24H  . enoxaparin (LOVENOX) injection  30 mg Subcutaneous Q24H  . folic acid  1 mg Intravenous Daily  . furosemide  120 mg Intravenous BID  . ipratropium  0.5 mg Nebulization Q4H  . levalbuterol  0.63 mg Nebulization Q4H  . methylPREDNISolone (SOLU-MEDROL) injection  125 mg Intravenous Q6H  . metoprolol  2.5 mg Intravenous Q6H  . pantoprazole (PROTONIX) IV  40 mg Intravenous QHS  . thiamine  100 mg Intravenous Daily  . vancomycin  1,500 mg Intravenous Q24H     Physical Exam: Vital signs in last 24 hours: Temp:  [97.9 F (36.6 C)-98.9 F (37.2 C)] 98.6 F (37 C) (12/30 0439) Pulse Rate:  [30-80] 79  (12/30 0800) Resp:  [14-21] 14  (12/30 0800) BP: (125-172)/(77-101) 157/89 mmHg (12/30 0800) SpO2:  [74 %-99 %] 97 % (12/30 1141) Weight:  [229 lb 8 oz (104.1 kg)] 229 lb 8 oz (104.1 kg) (12/30 0439) Weight change: -3 lb 8.4 oz (-1.6 kg) Last BM Date: 03/08/12  Intake/Output from previous day: 12/29 0701 - 12/30 0700 In: 4052.3 [P.O.:1800; I.V.:1890.3; IV Piggyback:362] Out: 5725 [Urine:5725] Total I/O In: 1020 [P.O.:120; Other:900] Out: -    Physical Exam: General- pt is awake,opens eyes,follows commands Resp- rhonchi + , decreased Bs at bases. CVS- S1S2 regular in rate and rhythm GIT- BS+, soft, NT, ND EXT- Trace LE Edema, NO  Cyanosis   Lab Results:  BMET  Basename 03/09/12 0813 03/08/12 0452  NA 140 151*  K 3.3* 3.5  CL 92* 106  CO2  39* 36*  GLUCOSE 186* 164*  BUN 63* 79*  CREATININE 1.85* 2.63*  CALCIUM 8.2* 8.3*   Creat trend  2013 1.3==>2.1==>3.67 ==>5.87==>6.20==>6.0==>4.85==>2.63==>1.85 2012 1.2--1.6  2011 1.3--1.5  2010 1.3--1.4  2009 1.2--1.4    Sodium  151==>140  MICRO Recent Results (from the past 240 hour([name]))  CULTURE, BLOOD (ROUTINE X 2)     Status: Normal   Collection Time   02/29/12 11:47 PM      Component Value Range Status Comment   Specimen Description BLOOD LEFT HAND   Final    Special Requests     Final    Value: BOTTLES DRAWN AEROBIC AND ANAEROBIC AEB 6CC ANA 4CC   Culture NO GROWTH 5 DAYS   Final    Report Status 03/06/2012 FINAL   Final   MRSA PCR SCREENING     Status: Abnormal   Collection Time   03/01/12  4:31 AM      Component Value Range Status Comment   MRSA by PCR INVALID RESULTS, SPECIMEN SENT FOR CULTURE (*) NEGATIVE Final   MRSA CULTURE     Status: Normal   Collection Time   03/01/12  4:31 AM      Component Value Range Status Comment   Specimen Description NOSE   Final    Special Requests NOSE   Final    Culture NOMRSA  Final    Report Status 03/03/2012 FINAL   Final   CULTURE, BLOOD (ROUTINE X 2)     Status: Normal   Collection Time   03/01/12  5:12 AM      Component Value Range Status Comment   Specimen Description BLOOD LEFT HAND   Final    Special Requests     Final    Value: BOTTLES DRAWN AEROBIC AND ANAEROBIC AEB 5CC ANA 6CC   Culture NO GROWTH 5 DAYS   Final    Report Status 03/06/2012 FINAL   Final   CULTURE, EXPECTORATED SPUTUM-ASSESSMENT     Status: Normal   Collection Time   03/06/12 11:44 PM      Component Value Range Status Comment   Specimen Description SPU   Final    Special Requests Normal   Final    Sputum evaluation     Final    Value: THIS SPECIMEN IS ACCEPTABLE. RESPIRATORY CULTURE REPORT TO FOLLOW.   Report Status 03/07/2012 FINAL   Final   CULTURE, RESPIRATORY     Status: Normal   Collection Time   03/06/12 11:44 PM      Component  Value Range Status Comment   Specimen Description SPUTUM   Final    Special Requests NONE   Final    Gram Stain     Final    Value: FEW WBC PRESENT, PREDOMINANTLY PMN     NO SQUAMOUS EPITHELIAL CELLS SEEN     NO ORGANISMS SEEN   Culture FEW CANDIDA ALBICANS   Final    Report Status 03/09/2012 FINAL   Final        Impression: 1)Renal  AKi on CKD  AKI secondary to Prerenal / ATN  AKI secondary to sepsis + Hypotension + ACE ( as outpt)  Most Likely NON Oliguric ATN ATN improving Creat nearly at his baseline.  CKD stage 3 .  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

## 2012-03-09 NOTE — Progress Notes (Signed)
ANTIBIOTIC CONSULT NOTE   Pharmacy Consult for Vancomycin Indication: pneumonia  Allergies  Allergen Reactions  . Bee Venom Anaphylaxis    Patient has an epi pen   Patient Measurements: Height: 6' (182.9 cm) Weight: 229 lb 8 oz (104.1 kg) IBW/kg (Calculated) : 77.6   Vital Signs: Temp: 98.6 F (37 C) (12/30 0439) Temp src: Oral (12/30 0439) BP: 157/89 mmHg (12/30 0800) Pulse Rate: 79  (12/30 0800) Intake/Output from previous day: 12/29 0701 - 12/30 0700 In: 4052.3 [P.O.:1800; I.V.:1890.3; IV Piggyback:362] Out: 5725 [Urine:5725] Intake/Output from this shift: Total I/O In: 1020 [P.O.:120; Other:900] Out: -   Labs:  Basename 03/09/12 0813 03/08/12 0452  WBC -- --  HGB -- --  PLT -- --  LABCREA -- --  CREATININE 1.85* 2.63*   Estimated Creatinine Clearance: 55 ml/min (by C-G formula based on Cr of 1.85).  Basename 03/09/12 0814  VANCOTROUGH 7.8*  VANCOPEAK --  Drue Dun --  GENTTROUGH --  GENTPEAK --  GENTRANDOM --  TOBRATROUGH --  TOBRAPEAK --  TOBRARND --  AMIKACINPEAK --  AMIKACINTROU --  AMIKACIN --     Microbiology: Recent Results (from the past 720 hour(s))  CULTURE, BLOOD (ROUTINE X 2)     Status: Normal   Collection Time   02/29/12 11:47 PM      Component Value Range Status Comment   Specimen Description BLOOD LEFT HAND   Final    Special Requests     Final    Value: BOTTLES DRAWN AEROBIC AND ANAEROBIC AEB 6CC ANA 4CC   Culture NO GROWTH 5 DAYS   Final    Report Status 03/06/2012 FINAL   Final   MRSA PCR SCREENING     Status: Abnormal   Collection Time   03/01/12  4:31 AM      Component Value Range Status Comment   MRSA by PCR INVALID RESULTS, SPECIMEN SENT FOR CULTURE (*) NEGATIVE Final   MRSA CULTURE     Status: Normal   Collection Time   03/01/12  4:31 AM      Component Value Range Status Comment   Specimen Description NOSE   Final    Special Requests NOSE   Final    Culture [facility]   Final    Report Status 03/03/2012 FINAL    Final   CULTURE, BLOOD (ROUTINE X 2)     Status: Normal   Collection Time   03/01/12  5:12 AM      Component Value Range Status Comment   Specimen Description BLOOD LEFT HAND   Final    Special Requests     Final    Value: BOTTLES DRAWN AEROBIC AND ANAEROBIC AEB 5CC ANA 6CC   Culture NO GROWTH 5 DAYS   Final    Report Status 03/06/2012 FINAL   Final   CULTURE, EXPECTORATED SPUTUM-ASSESSMENT     Status: Normal   Collection Time   03/06/12 11:44 PM      Component Value Range Status Comment   Specimen Description SPU   Final    Special Requests Normal   Final    Sputum evaluation     Final    Value: THIS SPECIMEN IS ACCEPTABLE. RESPIRATORY CULTURE REPORT TO FOLLOW.   Report Status 03/07/2012 FINAL   Final   CULTURE, RESPIRATORY     Status: Normal   Collection Time   03/06/12 11:44 PM      Component Value Range Status Comment   Specimen Description SPUTUM   Final  Special Requests NONE   Final    Gram Stain     Final    Value: FEW WBC PRESENT, PREDOMINANTLY PMN     NO SQUAMOUS EPITHELIAL CELLS SEEN     NO ORGANISMS SEEN   Culture FEW CANDIDA ALBICANS   Final    Report Status 03/09/2012 FINAL   Final    Medical History: Past Medical History  Diagnosis Date  . Hypertension   . Dyspnea   . Chronic kidney disease, stage 2, mildly decreased GFR     Creatinine of 1.31 in 04/2011  . COPD (chronic obstructive pulmonary disease)     moderate by spirometry in 08/2009;FEV1 of 1.1 ;nl alpha -1 antitrypsin   . Obesity   . Psoriasis   . Left eye trauma     with loss of vision  . Erectile dysfunction   . Lumbosacral spinal stenosis     chronic low back pain  . Headache   . Sleep apnea   . Carotid artery aneurysm    Medications:  Scheduled:     . antiseptic oral rinse  15 mL Mouth Rinse QID  . azithromycin  500 mg Intravenous Q24H  . cefTRIAXone (ROCEPHIN)  IV  1 g Intravenous Q24H  . enoxaparin (LOVENOX) injection  30 mg Subcutaneous Q24H  . folic acid  1 mg Intravenous Daily   . furosemide  120 mg Intravenous BID  . ipratropium  0.5 mg Nebulization Q4H  . levalbuterol  0.63 mg Nebulization Q4H  . methylPREDNISolone (SOLU-MEDROL) injection  125 mg Intravenous Q6H  . metoprolol  2.5 mg Intravenous Q6H  . pantoprazole (PROTONIX) IV  40 mg Intravenous QHS  . thiamine  100 mg Intravenous Daily  . vancomycin  1,500 mg Intravenous Q24H  . [DISCONTINUED] vancomycin  1,500 mg Intravenous Q48H   Assessment: 56yo morbidly obese male with poor renal fxn.  Intubated in ICU with respiratory failure.  SCr is improving.  Trough level is below goal.    Estimated Creatinine Clearance: 55 ml/min (by C-G formula based on Cr of 1.85).  Goal of Therapy:  Vancomycin trough level 15-20 mcg/ml  Plan: Increase Vancomycin to 1500mg  IV q24hrs Check trough at steady state Monitor labs, renal fxn, and cultures per protocol Duration of therapy per MD  Valrie Hart A 03/09/2012,11:55 AM

## 2012-03-09 NOTE — Progress Notes (Signed)
Report given to Derrick Ravel, RN on dept 300. Patient alert, oriented and in stable condition at the time of transport. Patient being transported in wheelchair to room 321, on tele and oxygen at 2lpm N/C. Patient's spouse present at the time of transport.

## 2012-03-09 NOTE — Progress Notes (Signed)
Subjective: He is awake and alert. He has not had any more respiratory distress.  Objective: Vital signs in last 24 hours: Temp:  [97.9 F (36.6 C)-98.9 F (37.2 C)] 98.6 F (37 C) (12/30 0439) Pulse Rate:  [30-84] 30  (12/30 0500) Resp:  [12-21] 18  (12/30 0500) BP: (125-193)/(77-101) 155/84 mmHg (12/30 0500) SpO2:  [74 %-99 %] 95 % (12/30 0716) Weight:  [104.1 kg (229 lb 8 oz)] 104.1 kg (229 lb 8 oz) (12/30 0439) Weight change: -1.6 kg (-3 lb 8.4 oz) Last BM Date: 03/08/12  Intake/Output from previous day: 12/29 0701 - 12/30 0700 In: 4052.3 [P.O.:1800; I.V.:1890.3; IV Piggyback:362] Out: 5725 [Urine:5725]  PHYSICAL EXAM General appearance: alert and mild distress Resp: rhonchi bilaterally Cardio: regular rate and rhythm, S1, S2 normal, no murmur, click, rub or gallop GI: soft, non-tender; bowel sounds normal; no masses,  no organomegaly Extremities: extremities normal, atraumatic, no cyanosis or edema  Lab Results:    Basic Metabolic Panel:  Basename 03/08/12 0452  NA 151*  K 3.5  CL 106  CO2 36*  GLUCOSE 164*  BUN 79*  CREATININE 2.63*  CALCIUM 8.3*  MG --  PHOS --   Liver Function Tests: No results found for this basename: AST:2,ALT:2,ALKPHOS:2,BILITOT:2,PROT:2,ALBUMIN:2 in the last 72 hours No results found for this basename: LIPASE:2,AMYLASE:2 in the last 72 hours No results found for this basename: AMMONIA:2 in the last 72 hours CBC: No results found for this basename: WBC:2,NEUTROABS:2,HGB:2,HCT:2,MCV:2,PLT:2 in the last 72 hours Cardiac Enzymes: No results found for this basename: CKTOTAL:3,CKMB:3,CKMBINDEX:3,TROPONINI:3 in the last 72 hours BNP: No results found for this basename: PROBNP:3 in the last 72 hours D-Dimer: No results found for this basename: DDIMER:2 in the last 72 hours CBG:  Basename 03/09/12 0719 03/09/12 0420 03/08/12 2357 03/08/12 2022 03/08/12 1624 03/08/12 1127  GLUCAP 147* 124* 133* 124* 138* 133*   Hemoglobin A1C: No  results found for this basename: HGBA1C in the last 72 hours Fasting Lipid Panel: No results found for this basename: CHOL,HDL,LDLCALC,TRIG,CHOLHDL,LDLDIRECT in the last 72 hours Thyroid Function Tests: No results found for this basename: TSH,T4TOTAL,FREET4,T3FREE,THYROIDAB in the last 72 hours Anemia Panel: No results found for this basename: VITAMINB12,FOLATE,FERRITIN,TIBC,IRON,RETICCTPCT in the last 72 hours Coagulation: No results found for this basename: LABPROT:2,INR:2 in the last 72 hours Urine Drug Screen: Drugs of Abuse     Component Value Date/Time   LABOPIA NONE DETECTED 03/01/2012 0019   COCAINSCRNUR POSITIVE* 03/01/2012 0019   LABBENZ NONE DETECTED 03/01/2012 0019   AMPHETMU NONE DETECTED 03/01/2012 0019   THCU NONE DETECTED 03/01/2012 0019   LABBARB NONE DETECTED 03/01/2012 0019    Alcohol Level: No results found for this basename: ETH:2 in the last 72 hours Urinalysis: No results found for this basename: COLORURINE:2,APPERANCEUR:2,LABSPEC:2,PHURINE:2,GLUCOSEU:2,HGBUR:2,BILIRUBINUR:2,KETONESUR:2,PROTEINUR:2,UROBILINOGEN:2,NITRITE:2,LEUKOCYTESUR:2 in the last 72 hours Misc. Labs:  ABGS  Basename 03/08/12 0555  PHART 7.416  PO2ART 64.6*  TCO2 30.7  HCO3 34.4*   CULTURES Recent Results (from the past 240 hour(s))  CULTURE, BLOOD (ROUTINE X 2)     Status: Normal   Collection Time   02/29/12 11:47 PM      Component Value Range Status Comment   Specimen Description BLOOD LEFT HAND   Final    Special Requests     Final    Value: BOTTLES DRAWN AEROBIC AND ANAEROBIC AEB 6CC ANA 4CC   Culture NO GROWTH 5 DAYS   Final    Report Status 03/06/2012 FINAL   Final   MRSA PCR SCREENING  Status: Abnormal   Collection Time   03/01/12  4:31 AM      Component Value Range Status Comment   MRSA by PCR INVALID RESULTS, SPECIMEN SENT FOR CULTURE (*) NEGATIVE Final   MRSA CULTURE     Status: Normal   Collection Time   03/01/12  4:31 AM      Component Value Range Status  Comment   Specimen Description NOSE   Final    Special Requests NOSE   Final    Culture [facility]   Final    Report Status 03/03/2012 FINAL   Final   CULTURE, BLOOD (ROUTINE X 2)     Status: Normal   Collection Time   03/01/12  5:12 AM      Component Value Range Status Comment   Specimen Description BLOOD LEFT HAND   Final    Special Requests     Final    Value: BOTTLES DRAWN AEROBIC AND ANAEROBIC AEB 5CC ANA 6CC   Culture NO GROWTH 5 DAYS   Final    Report Status 03/06/2012 FINAL   Final   CULTURE, EXPECTORATED SPUTUM-ASSESSMENT     Status: Normal   Collection Time   03/06/12 11:44 PM      Component Value Range Status Comment   Specimen Description SPU   Final    Special Requests Normal   Final    Sputum evaluation     Final    Value: THIS SPECIMEN IS ACCEPTABLE. RESPIRATORY CULTURE REPORT TO FOLLOW.   Report Status 03/07/2012 FINAL   Final   CULTURE, RESPIRATORY     Status: Normal (Preliminary result)   Collection Time   03/06/12 11:44 PM      Component Value Range Status Comment   Specimen Description SPUTUM   Final    Special Requests NONE   Final    Gram Stain     Final    Value: FEW WBC PRESENT, PREDOMINANTLY PMN     NO SQUAMOUS EPITHELIAL CELLS SEEN     NO ORGANISMS SEEN   Culture FEW CANDIDA ALBICANS   Final    Report Status PENDING   Incomplete    Studies/Results: No results found.  Medications:  Prior to Admission:  Prescriptions prior to admission  Medication Sig Dispense Refill  . albuterol (VENTOLIN HFA) 108 (90 BASE) MCG/ACT inhaler Inhale 2 puffs into the lungs every 4 (four) hours as needed. For shortness of breath      . cetirizine (ZYRTEC) 10 MG tablet Take 1 tablet (10 mg total) by mouth daily.      . cloNIDine (CATAPRES) 0.2 MG tablet Take 0.2 mg by mouth 2 (two) times daily.      . Fluticasone-Salmeterol (ADVAIR DISKUS) 250-50 MCG/DOSE AEPB Inhale 1 puff into the lungs every 12 (twelve) hours.        Marland Kitchen HYDROcodone-acetaminophen (NORCO) 5-325 MG per  tablet Take 1 tablet by mouth every 6 (six) hours as needed for pain.  60 tablet  5  . ipratropium-albuterol (DUONEB) 0.5-2.5 (3) MG/3ML SOLN Take 3 mLs by nebulization 2 (two) times daily as needed. For shortness of breath      . lisinopril (PRINIVIL,ZESTRIL) 40 MG tablet Take 40 mg by mouth daily.      . methotrexate (RHEUMATREX) 2.5 MG tablet Take 10 mg by mouth once a week.      . predniSONE (DELTASONE) 10 MG tablet Take 10 mg by mouth daily.      . ranitidine (ZANTAC) 300 MG tablet Take  300 mg by mouth at bedtime.      . Vitamin D, Ergocalciferol, (DRISDOL) 50000 UNITS CAPS Take 50,000 Units by mouth 2 (two) times a week.       Scheduled:   . antiseptic oral rinse  15 mL Mouth Rinse QID  . azithromycin  500 mg Intravenous Q24H  . cefTRIAXone (ROCEPHIN)  IV  1 g Intravenous Q24H  . enoxaparin (LOVENOX) injection  30 mg Subcutaneous Q24H  . folic acid  1 mg Intravenous Daily  . furosemide  120 mg Intravenous BID  . ipratropium  0.5 mg Nebulization Q4H  . levalbuterol  0.63 mg Nebulization Q4H  . methylPREDNISolone (SOLU-MEDROL) injection  125 mg Intravenous Q6H  . metoprolol  2.5 mg Intravenous Q6H  . pantoprazole (PROTONIX) IV  40 mg Intravenous QHS  . thiamine  100 mg Intravenous Daily  . vancomycin  1,500 mg Intravenous Q48H   Continuous:   . sodium chloride 75 mL/hr at 03/09/12 0505  . sodium chloride 10 mL/hr at 03/09/12 0505   NFA:OZHYQMVHQIONG (TYLENOL) oral liquid 160 mg/5 mL, fentaNYL, levalbuterol, LORazepam, sodium chloride  Assesment: He has had acute respiratory failure. He has COPD. He has acute on chronic renal failure. He has influenza A Principal Problem:  *Respiratory failure, acute Active Problems:  CHRONIC OBSTRUCTIVE PULMONARY DISEASE  Chronic kidney disease, stage 2, mildly decreased GFR  COPD exacerbation  Sinus tachycardia  Substance abuse  Acute on chronic renal failure  Influenza A (H1N1)  Community acquired pneumonia    Plan: I will plan to  transfer him out of the intensive care unit. He has not been very active so I'm going to ask for PT consultation to try to get him up and moving    LOS: 9 days   Lucca Ballo L 03/09/2012, 8:10 AM

## 2012-03-09 NOTE — Progress Notes (Signed)
UR Chart Review Completed  

## 2012-03-09 NOTE — Progress Notes (Signed)
NUTRITION FOLLOW UP  Intervention:   Glucerna Shake po BID, each supplement provides 220 kcal and 10 grams of protein.  Nutrition Dx:   Inadequate oral intake ongoing; now related to decreased appetite AEB pt reported intake.  Goal:   Pt to meet >/= 90% of their estimated nutrition needs; not met  Monitor:   Meal percentage intake, labs and wt trends  Assessment:   Pt successfully extubated and diet advanced to CHO Modified 12/28.  Meal intake records indicate he ate 75% of breakfast and lunch and 0% breakfast today. He reports poor appetite. Denies swallow difficulty or mouth pain. Missing teeth noted. He is agreeable to receive oral supplement.  Height: Ht Readings from Last 1 Encounters:  03/02/12 6' (1.829 m)    Weight Status:   Wt Readings from Last 1 Encounters:  03/09/12 229 lb 8 oz (104.1 kg)    Re-estimated needs:  Kcal: 1900-2300 kcal Protein: 160 gr Fluid: 1 ml/kcal  Skin: no issues noted  Diet Order: Carb Control po's 0% recent meal   Intake/Output Summary (Last 24 hours) at 03/09/12 1106 Last data filed at 03/09/12 1100  Gross per 24 hour  Intake 3870.33 ml  Output   5350 ml  Net -1479.67 ml    Last BM: 03/08/12 large loose   Labs:   Lab 03/09/12 0813 03/08/12 0452 03/06/12 0425  NA 140 151* 145  K 3.3* 3.5 3.8  CL 92* 106 103  CO2 39* 36* 27  BUN 63* 79* 99*  CREATININE 1.85* 2.63* 4.85*  CALCIUM 8.2* 8.3* 7.8*  MG -- -- --  PHOS -- -- --  GLUCOSE 186* 164* 162*    CBG (last 3)   Basename 03/09/12 0719 03/09/12 0420 03/08/12 2357  GLUCAP 147* 124* 133*    Scheduled Meds:   . antiseptic oral rinse  15 mL Mouth Rinse QID  . azithromycin  500 mg Intravenous Q24H  . cefTRIAXone (ROCEPHIN)  IV  1 g Intravenous Q24H  . enoxaparin (LOVENOX) injection  30 mg Subcutaneous Q24H  . folic acid  1 mg Intravenous Daily  . furosemide  120 mg Intravenous BID  . ipratropium  0.5 mg Nebulization Q4H  . levalbuterol  0.63 mg Nebulization Q4H    . methylPREDNISolone (SOLU-MEDROL) injection  125 mg Intravenous Q6H  . metoprolol  2.5 mg Intravenous Q6H  . pantoprazole (PROTONIX) IV  40 mg Intravenous QHS  . thiamine  100 mg Intravenous Daily  . vancomycin  1,500 mg Intravenous Q48H    Continuous Infusions:   . sodium chloride 75 mL/hr at 03/09/12 0505  . sodium chloride 10 mL/hr at 03/09/12 0505    [phone]

## 2012-03-09 NOTE — Evaluation (Signed)
Physical Therapy Evaluation Patient Details Name: [name] MRN: [id number] DOB: 1955/11/02 Today's Date: 03/09/2012 Time: 0865-7846 PT Time Calculation (min): 35 min  PT Assessment / Plan / Recommendation Clinical Impression  Pt was found to be very deconditioned upon eval.  He states thathe had gotten weak over recent times due to repeated episodes of COPD exacerbations.  He is normally able to ambulate with no AD but now needs a walker to ambulate only 25'.  He had a significant LOB after ambulating 12' and had to be stabilized by PT.  I am recommending SNF at d/c and pt agrees.    PT Assessment  Patient needs continued PT services    Follow Up Recommendations  SNF    Does the patient have the potential to tolerate intense rehabilitation      Barriers to Discharge Decreased caregiver support      Equipment Recommendations  Rolling walker with 5" wheels;None recommended by PT    Recommendations for Other Services     Frequency Min 3X/week    Precautions / Restrictions Precautions Precautions: Fall Restrictions Weight Bearing Restrictions: No   Pertinent Vitals/Pain       Mobility  Bed Mobility Bed Mobility: Supine to Sit;Sit to Supine Supine to Sit: 4: Min guard;HOB elevated Sit to Supine: 5: Supervision;HOB flat Transfers Transfers: Sit to Stand;Stand to Sit Sit to Stand: 5: Supervision;With upper extremity assist;From elevated surface;From bed Stand to Sit: 6: Modified independent (Device/Increase time);To bed;With upper extremity assist Details for Transfer Assistance: cues needed for hand placement Ambulation/Gait Ambulation/Gait Assistance: 4: Min assist Ambulation Distance (Feet): 25 Feet Assistive device: Rolling walker Ambulation/Gait Assistance Details: pt had been ambulating with no AD.  Now needs a walker for stability. Gait Pattern: Shuffle;Trunk flexed Gait velocity: very slow and labored General Gait Details: pt had a significant LOB  after ambulating about 12'...needed stabilizing by PT in order to maintain stance. Stairs: No Wheelchair Mobility Wheelchair Mobility: No    Shoulder Instructions     Exercises General Exercises - Lower Extremity Heel Slides: Strengthening;Both;10 reps;Supine Hip ABduction/ADduction: Strengthening;Both;10 reps;Supine Straight Leg Raises: AROM;Both;10 reps;Supine   PT Diagnosis: Difficulty walking;Generalized weakness  PT Problem List: Decreased strength;Decreased activity tolerance;Decreased mobility;Decreased knowledge of use of DME;Cardiopulmonary status limiting activity;Decreased safety awareness PT Treatment Interventions: DME instruction;Gait training;Therapeutic activities;Therapeutic exercise;Patient/family education   PT Goals Acute Rehab PT Goals PT Goal Formulation: With patient Time For Goal Achievement: 03/16/12 Potential to Achieve Goals: Good Pt will go Sit to Stand: with modified independence;with upper extremity assist PT Goal: Sit to Stand - Progress: Goal set today Pt will go Stand to Sit: with modified independence;with upper extremity assist PT Goal: Stand to Sit - Progress: Goal set today Pt will Ambulate: 51 - 150 feet;with supervision;with least restrictive assistive device PT Goal: Ambulate - Progress: Goal set today  Visit Information  Last PT Received On: 03/09/12    Subjective Data  Subjective: This has really scared me Patient Stated Goal: none stated   Prior Functioning  Home Living Lives With: Friend(s) Available Help at Discharge: Friend(s);Available PRN/intermittently Type of Home: House Home Access: Level entry Home Layout: One level Bathroom Shower/Tub: Engineer, manufacturing systems: Standard Home Adaptive Equipment: None Prior Function Level of Independence: Needs assistance Needs Assistance: Light Housekeeping;Meal Prep;Bathing Able to Take Stairs?: Yes Driving: Yes Vocation: On disability Communication Communication: No  difficulties    Cognition  Overall Cognitive Status: Appears within functional limits for tasks assessed/performed Arousal/Alertness: Awake/alert Orientation Level: Appears intact for tasks  assessed Behavior During Session: [facility] for tasks performed    Extremity/Trunk Assessment Right Lower Extremity Assessment RLE ROM/Strength/Tone: Within functional levels RLE Sensation: WFL - Light Touch RLE Coordination: WFL - gross motor Left Lower Extremity Assessment LLE ROM/Strength/Tone: Within functional levels LLE Sensation: WFL - Light Touch LLE Coordination: WFL - gross motor   Balance Balance Balance Assessed: No  End of Session PT - End of Session Equipment Utilized During Treatment: Gait belt Activity Tolerance: Patient limited by fatigue Patient left: in bed;with call bell/phone within reach;with bed alarm set  GP     Konrad Penta 03/09/2012, 3:45 PM

## 2012-03-10 LAB — BASIC METABOLIC PANEL
CO2: 42 mEq/L (ref 19–32)
Chloride: 93 mEq/L — ABNORMAL LOW (ref 96–112)
GFR calc Af Amer: 52 mL/min — ABNORMAL LOW (ref >90)
Potassium: 3.4 mEq/L — ABNORMAL LOW (ref 3.5–5.1)

## 2012-03-10 MED ORDER — SODIUM CHLORIDE 0.9 % IJ SOLN
10.0000 mL | Freq: Two times a day (BID) | INTRAMUSCULAR | Status: DC
Start: 2012-03-10 — End: 2012-03-12
  Administered 2012-03-10: 10 mL
  Administered 2012-03-11: 40 mL
  Administered 2012-03-11 – 2012-03-12 (×2): 10 mL

## 2012-03-10 MED ORDER — FUROSEMIDE 40 MG PO TABS
40.0000 mg | ORAL_TABLET | Freq: Two times a day (BID) | ORAL | Status: DC
  Administered 2012-03-10 – 2012-03-12 (×4): 40 mg via ORAL
  Filled 2012-03-10 (×4): qty 1

## 2012-03-10 MED ORDER — ENOXAPARIN SODIUM 60 MG/0.6ML ~~LOC~~ SOLN
50.0000 mg | SUBCUTANEOUS | Status: DC
  Administered 2012-03-11 – 2012-03-12 (×2): 50 mg via SUBCUTANEOUS
  Filled 2012-03-10 (×2): qty 0.6

## 2012-03-10 MED ORDER — CLONIDINE HCL 0.2 MG PO TABS
0.2000 mg | ORAL_TABLET | Freq: Two times a day (BID) | ORAL | Status: DC
  Administered 2012-03-10 – 2012-03-11 (×3): 0.2 mg via ORAL
  Filled 2012-03-10 (×3): qty 1

## 2012-03-10 MED ORDER — METHYLPREDNISOLONE SODIUM SUCC 40 MG IJ SOLR
40.0000 mg | Freq: Four times a day (QID) | INTRAMUSCULAR | Status: DC
  Administered 2012-03-10 – 2012-03-12 (×9): 40 mg via INTRAVENOUS
  Filled 2012-03-10 (×9): qty 1

## 2012-03-10 NOTE — Progress Notes (Signed)
[name]  MRN: [id number]  DOB/AGE: 1956/01/27 56 y.o.  Primary Care Physician:PATTERSON, KATHY, FNP  Admit date: 02/29/2012  Chief Complaint:  Chief Complaint  Patient presents with  . Respiratory Distress    S-Pt presented on  02/29/2012 with  Chief Complaint  Patient presents with  . Respiratory Distress   Pt offers no new complaints. Pt feels better.    Meds    . antiseptic oral rinse  15 mL Mouth Rinse QID  . azithromycin  500 mg Intravenous Q24H  . cefTRIAXone (ROCEPHIN)  IV  1 g Intravenous Q24H  . cloNIDine  0.2 mg Oral BID  . enoxaparin (LOVENOX) injection  50 mg Subcutaneous Q24H  . feeding supplement  237 mL Oral BID BM  . folic acid  1 mg Intravenous Daily  . furosemide  60 mg Intravenous BID  . ipratropium  0.5 mg Nebulization Q4H  . levalbuterol  0.63 mg Nebulization Q4H  . methylPREDNISolone (SOLU-MEDROL) injection  40 mg Intravenous Q6H  . pantoprazole (PROTONIX) IV  40 mg Intravenous QHS  . thiamine  100 mg Intravenous Daily  . vancomycin  1,500 mg Intravenous Q24H     Physical Exam: Vital signs in last 24 hours: Temp:  [97.2 F (36.2 C)-98.7 F (37.1 C)] 97.2 F (36.2 C) (12/31 0607) Pulse Rate:  [58-80] 80  (12/31 0607) Resp:  [18-20] 20  (12/31 0607) BP: (156-178)/(86-103) 171/99 mmHg (12/31 1052) SpO2:  [89 %-97 %] 95 % (12/31 0756) Weight:  [225 lb 15.5 oz (102.5 kg)] 225 lb 15.5 oz (102.5 kg) (12/31 0500) Weight change: -3 lb 8.4 oz (-1.6 kg) Last BM Date: 03/09/12  Intake/Output from previous day: 12/30 0701 - 12/31 0700 In: 4408.5 [P.O.:480; I.V.:1328.5; IV Piggyback:250] Out: 3200 [Urine:3200] Total I/O In: 676 [P.O.:120; IV Piggyback:556] Out: 1000 [Urine:1000]   Physical Exam: General- pt is awake,opens eyes,follows commands Resp- rhonchi + , decreased Bs at bases. CVS- S1S2 regular in rate and rhythm GIT- BS+, soft, NT, ND EXT- Trace LE Edema, NO  Cyanosis   Lab Results:  H/h 12.4 on  03/06/12  BMET  Basename 03/10/12 0512 03/09/12 0813  NA 141 140  K 3.4* 3.3*  CL 93* 92*  CO2 42* 39*  GLUCOSE 145* 186*  BUN 55* 63*  CREATININE 1.63* 1.85*  CALCIUM 8.2* 8.2*   Creat trend  2013 1.3==>2.1==>3.67 ==>5.87==>6.20==>6.0==>4.85==>2.63==>1.85==>1.63 2012 1.2--1.6  2011 1.3--1.5  2010 1.3--1.4  2009 1.2--1.4    Sodium  151==>141  MICRO Recent Results (from the past 240 hour(s))  CULTURE, BLOOD (ROUTINE X 2)     Status: Normal   Collection Time   02/29/12 11:47 PM      Component Value Range Status Comment   Specimen Description BLOOD LEFT HAND   Final    Special Requests     Final    Value: BOTTLES DRAWN AEROBIC AND ANAEROBIC AEB 6CC ANA 4CC   Culture NO GROWTH 5 DAYS   Final    Report Status 03/06/2012 FINAL   Final   MRSA PCR SCREENING     Status: Abnormal   Collection Time   03/01/12  4:31 AM      Component Value Range Status Comment   MRSA by PCR INVALID RESULTS, SPECIMEN SENT FOR CULTURE (*) NEGATIVE Final   MRSA CULTURE     Status: Normal   Collection Time   03/01/12  4:31 AM      Component Value Range Status Comment   Specimen Description NOSE   Final  Special Requests NOSE   Final    Culture [facility]   Final    Report Status 03/03/2012 FINAL   Final   CULTURE, BLOOD (ROUTINE X 2)     Status: Normal   Collection Time   03/01/12  5:12 AM      Component Value Range Status Comment   Specimen Description BLOOD LEFT HAND   Final    Special Requests     Final    Value: BOTTLES DRAWN AEROBIC AND ANAEROBIC AEB 5CC ANA 6CC   Culture NO GROWTH 5 DAYS   Final    Report Status 03/06/2012 FINAL   Final   CULTURE, EXPECTORATED SPUTUM-ASSESSMENT     Status: Normal   Collection Time   03/06/12 11:44 PM      Component Value Range Status Comment   Specimen Description SPU   Final    Special Requests Normal   Final    Sputum evaluation     Final    Value: THIS SPECIMEN IS ACCEPTABLE. RESPIRATORY CULTURE REPORT TO FOLLOW.   Report Status 03/07/2012  FINAL   Final   CULTURE, RESPIRATORY     Status: Normal   Collection Time   03/06/12 11:44 PM      Component Value Range Status Comment   Specimen Description SPUTUM   Final    Special Requests NONE   Final    Gram Stain     Final    Value: FEW WBC PRESENT, PREDOMINANTLY PMN     NO SQUAMOUS EPITHELIAL CELLS SEEN     NO ORGANISMS SEEN   Culture FEW CANDIDA ALBICANS   Final    Report Status 03/09/2012 FINAL   Final        Impression: 1)Renal  AKi on CKD  AKI secondary to Prerenal / ATN  AKI secondary to sepsis + Hypotension + ACE ( as outpt)  Most Likely NON Oliguric ATN ATN improving Creat  at his baseline.  CKD stage 3 .  CKD since 2009  CKD secondary to HTN   2)HTN BP not at goal On clonidine   3)Anemia HGb at goal (9--11)   4)ID- admitted with Influenza /sepsis Now much better   5)Resp- Admitted with resp failure  Now much better   6)FEN  hypokalemic  Secondary to Diuretics  Hypernatremic  secondary to IVf and poor free water  Now better   7)Acid base  Bicarb better  8) Substance abuse-  Cocaine +, Hx of ETOH and Tobacco as well        Plan:  Will decrease  IVF  Will reduce lasix dose IV to PO Will follow bmet     BHUTANI,MANPREET S 03/10/2012, 12:21 PM

## 2012-03-10 NOTE — Progress Notes (Signed)
Physical Therapy Treatment Patient Details Name: [name] MRN: [id number] DOB: 1955/05/03 Today's Date: 03/10/2012 Time: 1126-1205 PT Time Calculation (min): 39 min  PT Assessment / Plan / Recommendation Comments on Treatment Session  Pt tolerated ther ex followed by gait with no problems.  Endurance is still very compromiosed, only able to walk 30', but no LOB today.    Follow Up Recommendations        Does the patient have the potential to tolerate intense rehabilitation     Barriers to Discharge        Equipment Recommendations       Recommendations for Other Services    Frequency     Plan Discharge plan remains appropriate;Frequency remains appropriate    Precautions / Restrictions     Pertinent Vitals/Pain     Mobility  Bed Mobility Supine to Sit: 4: Min guard;HOB elevated Sit to Supine: Not Tested (comment) Transfers Sit to Stand: 5: Supervision;With upper extremity assist;From bed Stand to Sit: 6: Modified independent (Device/Increase time);To chair/3-in-1 Ambulation/Gait Ambulation/Gait Assistance: 4: Min assist Ambulation Distance (Feet): 30 Feet Assistive device: Rolling walker Gait Pattern: Trunk flexed;Shuffle Gait velocity: pace a bit faster today General Gait Details: no LOB today,  rested after ambulating 15' Stairs: No Wheelchair Mobility Wheelchair Mobility: No    Exercises General Exercises - Lower Extremity Quad Sets: AROM;Both;10 reps;Supine Gluteal Sets: AROM;Both;10 reps;Supine Heel Slides: Strengthening;Both;10 reps;Supine Hip ABduction/ADduction: Strengthening;Both;10 reps;Supine Straight Leg Raises: AROM;Both;10 reps;Supine   PT Diagnosis:    PT Problem List:   PT Treatment Interventions:     PT Goals Acute Rehab PT Goals PT Goal: Sit to Stand - Progress: Progressing toward goal PT Goal: Stand to Sit - Progress: Met PT Goal: Ambulate - Progress: Progressing toward goal  Visit Information  Last PT Received On:  03/10/12    Subjective Data  Subjective: no c/o   Cognition       Balance     End of Session PT - End of Session Equipment Utilized During Treatment: Gait belt Activity Tolerance: Patient tolerated treatment well Patient left: in chair;with call bell/phone within reach;with chair alarm set;with family/visitor present Nurse Communication: Mobility status   GP     Konrad Penta 03/10/2012, 12:11 PM

## 2012-03-10 NOTE — Progress Notes (Signed)
CRITICAL VALUE ALERT  Critical value received: CO2-42 Date of notification:03/10/2012 Time of notification: 0629 Critical value read back:yes  Nurse who received alert: A. Stevens Magwood, RN MD notified (1st page)Dr. Ouida Sills Time of first [phone] MD notified (2nd page):  Time of second page:  Responding MD: DR. Ouida Sills Time MD [phone]

## 2012-03-10 NOTE — Progress Notes (Signed)
Subjective: He says he feels better. His breathing is better. He is cooperating with incentive spirometry flutter valve and he is up and walking. His blood pressure has still been quite elevated  Objective: Vital signs in last 24 hours: Temp:  [97.2 F (36.2 C)-98.7 F (37.1 C)] 97.2 F (36.2 C) (12/31 0607) Pulse Rate:  [58-80] 80  (12/31 0607) Resp:  [18-20] 20  (12/31 0607) BP: (156-178)/(86-103) 178/103 mmHg (12/31 0607) SpO2:  [89 %-97 %] 95 % (12/31 0756) Weight:  [102.5 kg (225 lb 15.5 oz)] 102.5 kg (225 lb 15.5 oz) (12/31 0500) Weight change: -1.6 kg (-3 lb 8.4 oz) Last BM Date: 03/09/12  Intake/Output from previous day: 12/30 0701 - 12/31 0700 In: 3246 [P.O.:480; I.V.:416] Out: 3200 [Urine:3200]  PHYSICAL EXAM General appearance: alert, cooperative and mild distress Resp: rhonchi bilaterally Cardio: regular rate and rhythm, S1, S2 normal, no murmur, click, rub or gallop GI: soft, non-tender; bowel sounds normal; no masses,  no organomegaly Extremities: extremities normal, atraumatic, no cyanosis or edema  Lab Results:    Basic Metabolic Panel:  Basename 03/10/12 0512 03/09/12 0813  NA 141 140  K 3.4* 3.3*  CL 93* 92*  CO2 42* 39*  GLUCOSE 145* 186*  BUN 55* 63*  CREATININE 1.63* 1.85*  CALCIUM 8.2* 8.2*  MG -- --  PHOS -- --   Liver Function Tests: No results found for this basename: AST:2,ALT:2,ALKPHOS:2,BILITOT:2,PROT:2,ALBUMIN:2 in the last 72 hours No results found for this basename: LIPASE:2,AMYLASE:2 in the last 72 hours No results found for this basename: AMMONIA:2 in the last 72 hours CBC: No results found for this basename: WBC:2,NEUTROABS:2,HGB:2,HCT:2,MCV:2,PLT:2 in the last 72 hours Cardiac Enzymes: No results found for this basename: CKTOTAL:3,CKMB:3,CKMBINDEX:3,TROPONINI:3 in the last 72 hours BNP: No results found for this basename: PROBNP:3 in the last 72 hours D-Dimer: No results found for this basename: DDIMER:2 in the last 72  hours CBG:  Basename 03/09/12 0719 03/09/12 0420 03/08/12 2357 03/08/12 2022 03/08/12 1624 03/08/12 1127  GLUCAP 147* 124* 133* 124* 138* 133*   Hemoglobin A1C: No results found for this basename: HGBA1C in the last 72 hours Fasting Lipid Panel: No results found for this basename: CHOL,HDL,LDLCALC,TRIG,CHOLHDL,LDLDIRECT in the last 72 hours Thyroid Function Tests: No results found for this basename: TSH,T4TOTAL,FREET4,T3FREE,THYROIDAB in the last 72 hours Anemia Panel: No results found for this basename: VITAMINB12,FOLATE,FERRITIN,TIBC,IRON,RETICCTPCT in the last 72 hours Coagulation: No results found for this basename: LABPROT:2,INR:2 in the last 72 hours Urine Drug Screen: Drugs of Abuse     Component Value Date/Time   LABOPIA NONE DETECTED 03/01/2012 0019   COCAINSCRNUR POSITIVE* 03/01/2012 0019   LABBENZ NONE DETECTED 03/01/2012 0019   AMPHETMU NONE DETECTED 03/01/2012 0019   THCU NONE DETECTED 03/01/2012 0019   LABBARB NONE DETECTED 03/01/2012 0019    Alcohol Level: No results found for this basename: ETH:2 in the last 72 hours Urinalysis: No results found for this basename: COLORURINE:2,APPERANCEUR:2,LABSPEC:2,PHURINE:2,GLUCOSEU:2,HGBUR:2,BILIRUBINUR:2,KETONESUR:2,PROTEINUR:2,UROBILINOGEN:2,NITRITE:2,LEUKOCYTESUR:2 in the last 72 hours Misc. Labs:  ABGS  Basename 03/08/12 0555  PHART 7.416  PO2ART 64.6*  TCO2 30.7  HCO3 34.4*   CULTURES Recent Results (from the past 240 hour(s))  CULTURE, BLOOD (ROUTINE X 2)     Status: Normal   Collection Time   02/29/12 11:47 PM      Component Value Range Status Comment   Specimen Description BLOOD LEFT HAND   Final    Special Requests     Final    Value: BOTTLES DRAWN AEROBIC AND ANAEROBIC AEB 6CC ANA 4CC  Culture NO GROWTH 5 DAYS   Final    Report Status 03/06/2012 FINAL   Final   MRSA PCR SCREENING     Status: Abnormal   Collection Time   03/01/12  4:31 AM      Component Value Range Status Comment   MRSA by PCR  INVALID RESULTS, SPECIMEN SENT FOR CULTURE (*) NEGATIVE Final   MRSA CULTURE     Status: Normal   Collection Time   03/01/12  4:31 AM      Component Value Range Status Comment   Specimen Description NOSE   Final    Special Requests NOSE   Final    Culture [facility]   Final    Report Status 03/03/2012 FINAL   Final   CULTURE, BLOOD (ROUTINE X 2)     Status: Normal   Collection Time   03/01/12  5:12 AM      Component Value Range Status Comment   Specimen Description BLOOD LEFT HAND   Final    Special Requests     Final    Value: BOTTLES DRAWN AEROBIC AND ANAEROBIC AEB 5CC ANA 6CC   Culture NO GROWTH 5 DAYS   Final    Report Status 03/06/2012 FINAL   Final   CULTURE, EXPECTORATED SPUTUM-ASSESSMENT     Status: Normal   Collection Time   03/06/12 11:44 PM      Component Value Range Status Comment   Specimen Description SPU   Final    Special Requests Normal   Final    Sputum evaluation     Final    Value: THIS SPECIMEN IS ACCEPTABLE. RESPIRATORY CULTURE REPORT TO FOLLOW.   Report Status 03/07/2012 FINAL   Final   CULTURE, RESPIRATORY     Status: Normal   Collection Time   03/06/12 11:44 PM      Component Value Range Status Comment   Specimen Description SPUTUM   Final    Special Requests NONE   Final    Gram Stain     Final    Value: FEW WBC PRESENT, PREDOMINANTLY PMN     NO SQUAMOUS EPITHELIAL CELLS SEEN     NO ORGANISMS SEEN   Culture FEW CANDIDA ALBICANS   Final    Report Status 03/09/2012 FINAL   Final    Studies/Results: No results found.  Medications:  Prior to Admission:  Prescriptions prior to admission  Medication Sig Dispense Refill  . albuterol (VENTOLIN HFA) 108 (90 BASE) MCG/ACT inhaler Inhale 2 puffs into the lungs every 4 (four) hours as needed. For shortness of breath      . cetirizine (ZYRTEC) 10 MG tablet Take 1 tablet (10 mg total) by mouth daily.      . cloNIDine (CATAPRES) 0.2 MG tablet Take 0.2 mg by mouth 2 (two) times daily.      .  Fluticasone-Salmeterol (ADVAIR DISKUS) 250-50 MCG/DOSE AEPB Inhale 1 puff into the lungs every 12 (twelve) hours.        Marland Kitchen HYDROcodone-acetaminophen (NORCO) 5-325 MG per tablet Take 1 tablet by mouth every 6 (six) hours as needed for pain.  60 tablet  5  . ipratropium-albuterol (DUONEB) 0.5-2.5 (3) MG/3ML SOLN Take 3 mLs by nebulization 2 (two) times daily as needed. For shortness of breath      . lisinopril (PRINIVIL,ZESTRIL) 40 MG tablet Take 40 mg by mouth daily.      . methotrexate (RHEUMATREX) 2.5 MG tablet Take 10 mg by mouth once a week.      Marland Kitchen  predniSONE (DELTASONE) 10 MG tablet Take 10 mg by mouth daily.      . ranitidine (ZANTAC) 300 MG tablet Take 300 mg by mouth at bedtime.      . Vitamin D, Ergocalciferol, (DRISDOL) 50000 UNITS CAPS Take 50,000 Units by mouth 2 (two) times a week.       Scheduled:   . antiseptic oral rinse  15 mL Mouth Rinse QID  . azithromycin  500 mg Intravenous Q24H  . cefTRIAXone (ROCEPHIN)  IV  1 g Intravenous Q24H  . cloNIDine  0.2 mg Oral BID  . enoxaparin (LOVENOX) injection  30 mg Subcutaneous Q24H  . feeding supplement  237 mL Oral BID BM  . folic acid  1 mg Intravenous Daily  . furosemide  60 mg Intravenous BID  . ipratropium  0.5 mg Nebulization Q4H  . levalbuterol  0.63 mg Nebulization Q4H  . methylPREDNISolone (SOLU-MEDROL) injection  40 mg Intravenous Q6H  . pantoprazole (PROTONIX) IV  40 mg Intravenous QHS  . thiamine  100 mg Intravenous Daily  . vancomycin  1,500 mg Intravenous Q24H   Continuous:   . sodium chloride Stopped (03/09/12 1651)  . sodium chloride 0.45 % with kcl 75 mL/hr at 03/10/12 1610   RUE:[id number] (TYLENOL) oral liquid 160 mg/5 mL, fentaNYL, levalbuterol, LORazepam, sodium chloride  Assesment: He has respiratory failure and is improving. He had influenza A which was H. one and 1. He's been weak but he is improving with that. His blood pressure is not controlled. Principal Problem:  *Respiratory failure,  acute Active Problems:  CHRONIC OBSTRUCTIVE PULMONARY DISEASE  Chronic kidney disease, stage 2, mildly decreased GFR  COPD exacerbation  Sinus tachycardia  Substance abuse  Acute on chronic renal failure  Influenza A (H1N1)  Community acquired pneumonia    Plan: I want to switch him to clonidine which is been taking at home. He will continue all his other treatments. He may be able to be discharged in the next 48 hours    LOS: 10 days   Danyele Smejkal L 03/10/2012, 8:58 AM

## 2012-03-10 NOTE — Clinical Social Work Note (Signed)
CSW presented bed offers to pt who chooses [facility]. Facility notified and can accept pt on holiday if stable. CSW also spoke with pt about substance abuse as documented in History and Physical. He says it is "not that much" and does not want to discuss further but stated he did not need help to stop. History and Physical sent to [facility].   Derenda Fennel, [place] [phone]

## 2012-03-10 NOTE — Progress Notes (Signed)
Critical CO2 value received. MD notified. No new orders at this time. Will continue to monitor.

## 2012-03-10 NOTE — Clinical Social Work Placement (Signed)
Clinical Social Work Department CLINICAL SOCIAL WORK PLACEMENT NOTE 03/10/2012  Patient:  [name], [name]  Account Number:  [id number] Admit date:  02/29/2012  Clinical Social Worker:  Derenda Fennel, LCSW  Date/time:  03/10/2012 08:55 AM  Clinical Social Work is seeking post-discharge placement for this patient at the following level of care:   SKILLED NURSING   (*CSW will update this form in Epic as items are completed)   03/10/2012  Patient/family provided with [facility] Department of Clinical Social Work's list of facilities offering this level of care within the geographic area requested by the patient (or if unable, by the patient's family).  03/10/2012  Patient/family informed of their freedom to choose among providers that offer the needed level of care, that participate in Medicare, Medicaid or managed care program needed by the patient, have an available bed and are willing to accept the patient.  03/10/2012  Patient/family informed of [facility]' ownership interest in [facility], as well as of the fact that they are under no obligation to receive care at this facility.  PASARR submitted to EDS on 03/10/2012 PASARR number received from EDS on 03/10/2012  FL2 transmitted to all facilities in geographic area requested by pt/family on  03/10/2012 FL2 transmitted to all facilities within larger geographic area on 03/10/2012  Patient informed that his/her managed care company has contracts with or will negotiate with  certain facilities, including the following:     Patient/family informed of bed offers received:  03/10/2012 Patient chooses bed at [facility] Physician recommends and patient chooses bed at  [facility]  Patient to be transferred to  on   Patient to be transferred to facility by   The following physician request were entered in Epic:   Additional Comments:  Derenda Fennel, LCSW [phone]

## 2012-03-10 NOTE — Clinical Social Work Placement (Signed)
Clinical Social Work Department CLINICAL SOCIAL WORK PLACEMENT NOTE 03/10/2012  Patient:  [name], [name]  Account Number:  [id number] Admit date:  02/29/2012  Clinical Social Worker:  Derenda Fennel, LCSW  Date/time:  03/10/2012 08:55 AM  Clinical Social Work is seeking post-discharge placement for this patient at the following level of care:   SKILLED NURSING   (*CSW will update this form in Epic as items are completed)   03/10/2012  Patient/family provided with [facility] Department of Clinical Social Work's list of facilities offering this level of care within the geographic area requested by the patient (or if unable, by the patient's family).  03/10/2012  Patient/family informed of their freedom to choose among providers that offer the needed level of care, that participate in Medicare, Medicaid or managed care program needed by the patient, have an available bed and are willing to accept the patient.  03/10/2012  Patient/family informed of [facility]' ownership interest in [facility], as well as of the fact that they are under no obligation to receive care at this facility.  PASARR submitted to EDS on 03/10/2012 PASARR number received from EDS on 03/10/2012  FL2 transmitted to all facilities in geographic area requested by pt/family on  03/10/2012 FL2 transmitted to all facilities within larger geographic area on 03/10/2012  Patient informed that his/her managed care company has contracts with or will negotiate with  certain facilities, including the following:     Patient/family informed of bed offers received:   Patient chooses bed at  Physician recommends and patient chooses bed at    Patient to be transferred to  on   Patient to be transferred to facility by   The following physician request were entered in Epic:   Additional Comments:  Derenda Fennel, LCSW [phone]

## 2012-03-10 NOTE — Clinical Social Work Psychosocial (Signed)
Clinical Social Work Department BRIEF PSYCHOSOCIAL ASSESSMENT 03/10/2012  Patient:  [name], [name]     Account Number:  [id number]     Admit date:  02/29/2012  Clinical Social Worker:  Nancie Neas  Date/Time:  03/10/2012 09:00 AM  Referred by:  Physician  Date Referred:  03/10/2012 Referred for  SNF Placement   Other Referral:   Interview type:  Patient Other interview type:    PSYCHOSOCIAL DATA Living Status:  FRIEND(S) Admitted from facility:   Level of care:   Primary support name:  [name] Primary support relationship to patient:  FRIEND Degree of support available:   supportive per pt    CURRENT CONCERNS Current Concerns  Post-Acute Placement   Other Concerns:    SOCIAL WORK ASSESSMENT / PLAN CSW met with pt at bedside. Pt alert and oriented and reports he lives with his friend, [name] who he describes as his best support person. Pt indicates he was fairly independent at home but felt he should have had a cane as he has had an unstable gait for awhile. He admits to generalized weakness during hospitalization. PT evaluated pt yesterday and recommendation is for SNF. CSW discussed placement process including Medicare coverage/criteria. He requests placement in [place] which is where he lives. CSW will fax out FL2 and follow up with bed offers.   Assessment/plan status:  Psychosocial Support/Ongoing Assessment of Needs Other assessment/ plan:   Information/referral to community resources:   SNF list    PATIENT'S/FAMILY'S RESPONSE TO PLAN OF CARE: Pt is agreeable to [facility]. SNF list provided.        Derenda Fennel, [place] [phone]

## 2012-03-11 DIAGNOSIS — A419 Sepsis, unspecified organism: Secondary | ICD-10-CM | POA: Diagnosis present

## 2012-03-11 LAB — CBC
HCT: 35.9 % — ABNORMAL LOW (ref 39.0–52.0)
Hemoglobin: 11.6 g/dL — ABNORMAL LOW (ref 13.0–17.0)
MCH: 30.1 pg (ref 26.0–34.0)
MCHC: 32.3 g/dL (ref 30.0–36.0)
MCV: 93 fL (ref 78.0–100.0)
Platelets: 192 10*3/uL (ref 150–400)
RBC: 3.86 MIL/uL — ABNORMAL LOW (ref 4.22–5.81)
RDW: 13.4 % (ref 11.5–15.5)
WBC: 8.7 10*3/uL (ref 4.0–10.5)

## 2012-03-11 LAB — BASIC METABOLIC PANEL
CO2: 33 mEq/L — ABNORMAL HIGH (ref 19–32)
Chloride: 93 mEq/L — ABNORMAL LOW (ref 96–112)
Glucose, Bld: 154 mg/dL — ABNORMAL HIGH (ref 70–99)
Potassium: 3.2 mEq/L — ABNORMAL LOW (ref 3.5–5.1)
Sodium: 138 mEq/L (ref 135–145)

## 2012-03-11 MED ORDER — CLONIDINE HCL 0.2 MG PO TABS
0.2000 mg | ORAL_TABLET | Freq: Three times a day (TID) | ORAL | Status: DC
  Administered 2012-03-11 – 2012-03-12 (×3): 0.2 mg via ORAL
  Filled 2012-03-11 (×3): qty 1

## 2012-03-11 MED ORDER — FOLIC ACID 1 MG PO TABS
1.0000 mg | ORAL_TABLET | Freq: Every day | ORAL | Status: DC
  Administered 2012-03-12: 1 mg via ORAL
  Filled 2012-03-11: qty 1

## 2012-03-11 MED ORDER — PANTOPRAZOLE SODIUM 40 MG PO TBEC
40.0000 mg | DELAYED_RELEASE_TABLET | Freq: Every day | ORAL | Status: DC
  Administered 2012-03-12: 40 mg via ORAL
  Filled 2012-03-11: qty 1

## 2012-03-11 MED ORDER — POTASSIUM CHLORIDE 20 MEQ/15ML (10%) PO LIQD
20.0000 meq | Freq: Two times a day (BID) | ORAL | Status: DC
  Administered 2012-03-11: 20 meq via ORAL
  Administered 2012-03-11: 10:00:00 via ORAL
  Filled 2012-03-11 (×2): qty 30

## 2012-03-11 MED ORDER — VITAMIN B-1 100 MG PO TABS
100.0000 mg | ORAL_TABLET | Freq: Every day | ORAL | Status: DC
  Administered 2012-03-12: 100 mg via ORAL
  Filled 2012-03-11: qty 1

## 2012-03-11 NOTE — Progress Notes (Signed)
Physical Therapy Treatment Patient Details Name: AZZAM MEHRA MRN: [id number] DOB: 19-Mar-1955 Today's Date: 03/11/2012 Time: 1191-4782 PT Time Calculation (min): 28 min  PT Assessment / Plan / Recommendation Comments on Treatment Session  Pt with improved ambulation today although pt would not sit up due to being up all night long.  Pt states he has everythng he needs at home                   Frequency Min 3X/week   Plan Discharge plan remains appropriate    Precautions / Restrictions Restrictions Weight Bearing Restrictions: No   Pertinent Vitals/Pain 0    Mobility  Bed Mobility Supine to Sit: 6: Modified independent (Device/Increase time) Sit to Supine: 6: Modified independent (Device/Increase time) Transfers Transfers: Sit to Stand Sit to Stand: 5: Supervision Stand to Sit: 5: Supervision (pt does not control decent) Ambulation/Gait Ambulation/Gait Assistance: 5: Supervision Ambulation Distance (Feet): 36 Feet Assistive device: Rolling walker Gait Pattern: Shuffle;Trunk flexed    Exercises General Exercises - Lower Extremity Gluteal Sets: Strengthening;10 reps;Seated Long Arc Quad: Strengthening;10 reps;Seated Hip Flexion/Marching: Strengthening;10 reps;Seated Toe Raises: Strengthening;10 reps;Seated Heel Raises: Strengthening;10 reps;Seated   PT Diagnosis:   difficulty walking PT Problem List:  weakness PT Treatment Interventions:   gt; exercise  PT Goals Acute Rehab PT Goals PT Goal: Sit to Stand - Progress: Met PT Goal: Stand to Sit - Progress: Progressing toward goal PT Goal: Ambulate - Progress: Progressing toward goal  Visit Information  Last PT Received On: 03/11/12    Subjective Data  Subjective: Pt states his catheter was taken out yesterday and he has not had any sleep due to having to urinate every hour    Cognition  Overall Cognitive Status: Appears within functional limits for tasks assessed/performed Arousal/Alertness:  Awake/alert Orientation Level: Appears intact for tasks assessed Behavior During Session: [facility] for tasks performed    Balance     End of Session PT - End of Session Equipment Utilized During Treatment: Gait belt Activity Tolerance: Patient tolerated treatment well Patient left: in bed   GP     RUSSELL,CINDY 03/11/2012, 11:37 AM

## 2012-03-11 NOTE — Progress Notes (Signed)
[name]  MRN: [id number]  DOB/AGE: 58-Jan-1957 58 y.o.  Primary Care Physician:PATTERSON, KATHY, FNP  Admit date: 02/29/2012  Chief Complaint:  Chief Complaint  Patient presents with  . Respiratory Distress    S-Pt presented on  02/29/2012 with  Chief Complaint  Patient presents with  . Respiratory Distress   Pt offers no new complaints.   Meds    . antiseptic oral rinse  15 mL Mouth Rinse QID  . azithromycin  500 mg Intravenous Q24H  . cefTRIAXone (ROCEPHIN)  IV  1 g Intravenous Q24H  . cloNIDine  0.2 mg Oral BID  . enoxaparin (LOVENOX) injection  50 mg Subcutaneous Q24H  . feeding supplement  237 mL Oral BID BM  . folic acid  1 mg Intravenous Daily  . furosemide  40 mg Oral BID  . ipratropium  0.5 mg Nebulization Q4H  . levalbuterol  0.63 mg Nebulization Q4H  . methylPREDNISolone (SOLU-MEDROL) injection  40 mg Intravenous Q6H  . pantoprazole (PROTONIX) IV  40 mg Intravenous QHS  . sodium chloride  10-40 mL Intracatheter Q12H  . thiamine  100 mg Intravenous Daily  . vancomycin  1,500 mg Intravenous Q24H     Physical Exam: Vital signs in last 24 hours: Temp:  [97.9 F (36.6 C)-98.3 F (36.8 C)] 98.3 F (36.8 C) (01/01 0545) Pulse Rate:  [78-89] 78  (01/01 0545) Resp:  [20] 20  (01/01 0545) BP: (140-171)/(88-101) 154/101 mmHg (01/01 0545) SpO2:  [92 %-95 %] 92 % (01/01 0719) Weight:  [226 lb 10.1 oz (102.8 kg)] 226 lb 10.1 oz (102.8 kg) (01/01 0413) Weight change: 10.6 oz (0.3 kg) Last BM Date: 03/10/12  Intake/Output from previous day: 12/31 0701 - 01/01 0700 In: 3528.8 [P.O.:1320; I.V.:1045.8; IV Piggyback:926] Out: 3500 [Urine:3500] Total I/O In: -  Out: 150 [Urine:150]   Physical Exam: General- pt is awake,alert, follows commands Resp-NO acute distress, Chest clear to auscultation. CVS- S1S2 regular in rate and rhythm GIT- BS+, soft, NT, ND EXT- 1+E Edema, NO  Cyanosis   Lab Results:  H/h 12.4 on 03/06/12  BMET  Basename  03/11/12 0530 03/10/12 0512  NA 138 141  K 3.2* 3.4*  CL 93* 93*  CO2 33* 42*  GLUCOSE 154* 145*  BUN 45* 55*  CREATININE 1.27 1.63*  CALCIUM 7.3* 8.2*   Creat trend  2013 1.3==>6.20==>6.0==>4.85==>2.63==>1.85==>1.63==>1.27 2012 1.2--1.6  2011 1.3--1.5  2010 1.3--1.4  2009 1.2--1.4    Sodium  151==>141  MICRO Recent Results (from the past 240 hour(s))  CULTURE, EXPECTORATED SPUTUM-ASSESSMENT     Status: Normal   Collection Time   03/06/12 11:44 PM      Component Value Range Status Comment   Specimen Description SPU   Final    Special Requests Normal   Final    Sputum evaluation     Final    Value: THIS SPECIMEN IS ACCEPTABLE. RESPIRATORY CULTURE REPORT TO FOLLOW.   Report Status 03/07/2012 FINAL   Final   CULTURE, RESPIRATORY     Status: Normal   Collection Time   03/06/12 11:44 PM      Component Value Range Status Comment   Specimen Description SPUTUM   Final    Special Requests NONE   Final    Gram Stain     Final    Value: FEW WBC PRESENT, PREDOMINANTLY PMN     NO SQUAMOUS EPITHELIAL CELLS SEEN     NO ORGANISMS SEEN   Culture FEW CANDIDA ALBICANS   Final  Report Status 03/09/2012 FINAL   Final        Impression: 1)Renal  AKi on CKD  AKI secondary to Prerenal / ATN  AKI secondary to sepsis + Hypotension + ACE ( as outpt)  Most Likely NON Oliguric ATN ATN improving Creat  at his baseline.  CKD stage 3 .  CKD since 2009  CKD secondary to HTN   2)HTN BP not at goal On clonidine   3)Anemia HGb at goal (9--11)   4)ID- admitted with Influenza /sepsis Now much better   5)Resp- Admitted with resp failure  Now much better   6)FEN  hypokalemic  Secondary to Diuretics  Hypernatremic  secondary to IVf and poor free water  Now better   7)Acid base  Bicarb better  8) Substance abuse-  Cocaine +, Hx of ETOH and Tobacco as well        Plan:  Will replete Kcl po Will d/c iVF  Will d/c lasix once edema is better    Larz Mark  S 03/11/2012, 9:09 AM

## 2012-03-11 NOTE — Progress Notes (Signed)
Subjective: He is awake and alert. He is able to ambulate in the halls some. He has no new complaints. His medications are being changed to by mouth.  Objective: Vital signs in last 24 hours: Temp:  [97.9 F (36.6 C)-98.3 F (36.8 C)] 98.3 F (36.8 C) (01/01 0545) Pulse Rate:  [76-89] 76  (01/01 1048) Resp:  [20] 20  (01/01 0545) BP: (140-199)/(88-105) 153/96 mmHg (01/01 1048) SpO2:  [90 %-95 %] 90 % (01/01 1056) Weight:  [102.8 kg (226 lb 10.1 oz)] 102.8 kg (226 lb 10.1 oz) (01/01 0413) Weight change: 0.3 kg (10.6 oz) Last BM Date: 03/10/12  Intake/Output from previous day: 12/31 0701 - 01/01 0700 In: 3528.8 [P.O.:1320; I.V.:1045.8; IV Piggyback:926] Out: 3500 [Urine:3500]  PHYSICAL EXAM General appearance: alert, cooperative and mild distress Resp: rhonchi bilaterally Cardio: regular rate and rhythm, S1, S2 normal, no murmur, click, rub or gallop GI: soft, non-tender; bowel sounds normal; no masses,  no organomegaly Extremities: extremities normal, atraumatic, no cyanosis or edema  Lab Results:    Basic Metabolic Panel:  Basename 03/11/12 0530 03/10/12 0512  NA 138 141  K 3.2* 3.4*  CL 93* 93*  CO2 33* 42*  GLUCOSE 154* 145*  BUN 45* 55*  CREATININE 1.27 1.63*  CALCIUM 7.3* 8.2*  MG -- --  PHOS -- --   Liver Function Tests: No results found for this basename: AST:2,ALT:2,ALKPHOS:2,BILITOT:2,PROT:2,ALBUMIN:2 in the last 72 hours No results found for this basename: LIPASE:2,AMYLASE:2 in the last 72 hours No results found for this basename: AMMONIA:2 in the last 72 hours CBC:  Basename 03/11/12 0530  WBC 8.7  NEUTROABS --  HGB 11.6*  HCT 35.9*  MCV 93.0  PLT 192   Cardiac Enzymes: No results found for this basename: CKTOTAL:3,CKMB:3,CKMBINDEX:3,TROPONINI:3 in the last 72 hours BNP: No results found for this basename: PROBNP:3 in the last 72 hours D-Dimer: No results found for this basename: DDIMER:2 in the last 72 hours CBG:  Basename 03/09/12 0719  03/09/12 0420 03/08/12 2357 03/08/12 2022 03/08/12 1624  GLUCAP 147* 124* 133* 124* 138*   Hemoglobin A1C: No results found for this basename: HGBA1C in the last 72 hours Fasting Lipid Panel: No results found for this basename: CHOL,HDL,LDLCALC,TRIG,CHOLHDL,LDLDIRECT in the last 72 hours Thyroid Function Tests: No results found for this basename: TSH,T4TOTAL,FREET4,T3FREE,THYROIDAB in the last 72 hours Anemia Panel: No results found for this basename: VITAMINB12,FOLATE,FERRITIN,TIBC,IRON,RETICCTPCT in the last 72 hours Coagulation: No results found for this basename: LABPROT:2,INR:2 in the last 72 hours Urine Drug Screen: Drugs of Abuse     Component Value Date/Time   LABOPIA NONE DETECTED 03/01/2012 0019   COCAINSCRNUR POSITIVE* 03/01/2012 0019   LABBENZ NONE DETECTED 03/01/2012 0019   AMPHETMU NONE DETECTED 03/01/2012 0019   THCU NONE DETECTED 03/01/2012 0019   LABBARB NONE DETECTED 03/01/2012 0019    Alcohol Level: No results found for this basename: ETH:2 in the last 72 hours Urinalysis: No results found for this basename: COLORURINE:2,APPERANCEUR:2,LABSPEC:2,PHURINE:2,GLUCOSEU:2,HGBUR:2,BILIRUBINUR:2,KETONESUR:2,PROTEINUR:2,UROBILINOGEN:2,NITRITE:2,LEUKOCYTESUR:2 in the last 72 hours Misc. Labs:  ABGS No results found for this basename: PHART,PCO2,PO2ART,TCO2,HCO3 in the last 72 hours CULTURES Recent Results (from the past 240 hour(s))  CULTURE, EXPECTORATED SPUTUM-ASSESSMENT     Status: Normal   Collection Time   03/06/12 11:44 PM      Component Value Range Status Comment   Specimen Description SPU   Final    Special Requests Normal   Final    Sputum evaluation     Final    Value: THIS SPECIMEN IS ACCEPTABLE. RESPIRATORY  CULTURE REPORT TO FOLLOW.   Report Status 03/07/2012 FINAL   Final   CULTURE, RESPIRATORY     Status: Normal   Collection Time   03/06/12 11:44 PM      Component Value Range Status Comment   Specimen Description SPUTUM   Final    Special Requests  NONE   Final    Gram Stain     Final    Value: FEW WBC PRESENT, PREDOMINANTLY PMN     NO SQUAMOUS EPITHELIAL CELLS SEEN     NO ORGANISMS SEEN   Culture FEW CANDIDA ALBICANS   Final    Report Status 03/09/2012 FINAL   Final    Studies/Results: No results found.  Medications:  Prior to Admission:  Prescriptions prior to admission  Medication Sig Dispense Refill  . albuterol (VENTOLIN HFA) 108 (90 BASE) MCG/ACT inhaler Inhale 2 puffs into the lungs every 4 (four) hours as needed. For shortness of breath      . cetirizine (ZYRTEC) 10 MG tablet Take 1 tablet (10 mg total) by mouth daily.      . cloNIDine (CATAPRES) 0.2 MG tablet Take 0.2 mg by mouth 2 (two) times daily.      . Fluticasone-Salmeterol (ADVAIR DISKUS) 250-50 MCG/DOSE AEPB Inhale 1 puff into the lungs every 12 (twelve) hours.        Marland Kitchen HYDROcodone-acetaminophen (NORCO) 5-325 MG per tablet Take 1 tablet by mouth every 6 (six) hours as needed for pain.  60 tablet  5  . ipratropium-albuterol (DUONEB) 0.5-2.5 (3) MG/3ML SOLN Take 3 mLs by nebulization 2 (two) times daily as needed. For shortness of breath      . lisinopril (PRINIVIL,ZESTRIL) 40 MG tablet Take 40 mg by mouth daily.      . methotrexate (RHEUMATREX) 2.5 MG tablet Take 10 mg by mouth once a week.      . predniSONE (DELTASONE) 10 MG tablet Take 10 mg by mouth daily.      . ranitidine (ZANTAC) 300 MG tablet Take 300 mg by mouth at bedtime.      . Vitamin D, Ergocalciferol, (DRISDOL) 50000 UNITS CAPS Take 50,000 Units by mouth 2 (two) times a week.       Scheduled:   . antiseptic oral rinse  15 mL Mouth Rinse QID  . azithromycin  500 mg Intravenous Q24H  . cefTRIAXone (ROCEPHIN)  IV  1 g Intravenous Q24H  . cloNIDine  0.2 mg Oral BID  . enoxaparin (LOVENOX) injection  50 mg Subcutaneous Q24H  . feeding supplement  237 mL Oral BID BM  . folic acid  1 mg Oral Daily  . furosemide  40 mg Oral BID  . ipratropium  0.5 mg Nebulization Q4H  . levalbuterol  0.63 mg  Nebulization Q4H  . methylPREDNISolone (SOLU-MEDROL) injection  40 mg Intravenous Q6H  . pantoprazole  40 mg Oral Daily  . potassium chloride  20 mEq Oral BID  . sodium chloride  10-40 mL Intracatheter Q12H  . thiamine  100 mg Oral Daily  . vancomycin  1,500 mg Intravenous Q24H   Continuous:   . sodium chloride 10 mL/hr (03/11/12 1028)   ZOX:[id number] (TYLENOL) oral liquid 160 mg/5 mL, fentaNYL, levalbuterol, LORazepam, sodium chloride  Assesment: He is improving after his bout of acute respiratory failure. He is working with PT now. His blood pressure is better. I'm switching his medicines to by mouth. Principal Problem:  *Respiratory failure, acute Active Problems:  CHRONIC OBSTRUCTIVE PULMONARY DISEASE  Chronic kidney disease, stage  2, mildly decreased GFR  COPD exacerbation  Sinus tachycardia  Substance abuse  Acute on chronic renal failure  Influenza A (H1N1)  Community acquired pneumonia  SIRS due to infectious process with acute organ dysfunction    Plan: I think he will be ready for transfer to skilled care facility tomorrow    LOS: 11 days   Rome Echavarria L 03/11/2012, 11:28 AM

## 2012-03-11 NOTE — Progress Notes (Signed)
The patient is receiving Protonix, folic acid, & thiamine by the intravenous route.  Based on criteria approved by the Pharmacy and Therapeutics Committee and the Medical Executive Committee, the medication is being converted to the equivalent oral dose form.  These criteria include: -No Active GI bleeding -Able to tolerate diet of full liquids (or better) or tube feeding OR able to tolerate other medications by the oral or enteral route  If you have any questions about this conversion, please contact the Pharmacy Department (ext [phone]).  Thank you.  Elson Clan, [facility] 03/11/2012 11:18 AM

## 2012-03-12 MED ORDER — HEPARIN SOD (PORK) LOCK FLUSH 100 UNIT/ML IV SOLN
250.0000 [IU] | INTRAVENOUS | Status: AC | PRN
  Administered 2012-03-12: 14:00:00
  Filled 2012-03-12: qty 10

## 2012-03-12 MED ORDER — POTASSIUM CHLORIDE 20 MEQ/15ML (10%) PO LIQD
40.0000 meq | Freq: Two times a day (BID) | ORAL | Status: DC
  Administered 2012-03-12: 40 meq via ORAL
  Filled 2012-03-12: qty 30

## 2012-03-12 NOTE — Progress Notes (Signed)
Subjective: Interval History: has no complaint of nausea or vomiting. He denies also any difficulty breathing overall patient is a that he's feeling better..  Objective: Vital signs in last 24 hours: Temp:  [97.2 F (36.2 C)-98.2 F (36.8 C)] 98.1 F (36.7 C) (01/02 0500) Pulse Rate:  [75-86] 75  (01/02 0500) Resp:  [20] 20  (01/02 0500) BP: (135-199)/(85-105) 149/93 mmHg (01/02 0500) SpO2:  [90 %-98 %] 97 % (01/02 0706) Weight:  [104.3 kg (229 lb 15 oz)] 104.3 kg (229 lb 15 oz) (01/02 0429) Weight change: 1.5 kg (3 lb 4.9 oz)  Intake/Output from previous day: 01/01 0701 - 01/02 0700 In: 2055.3 [P.O.:1020; I.V.:235.3; IV Piggyback:800] Out: 1975 [Urine:1975] Intake/Output this shift:    General appearance: alert, cooperative and no distress Resp: clear to auscultation bilaterally Cardio: regular rate and rhythm, S1, S2 normal, no murmur, click, rub or gallop GI: soft, non-tender; bowel sounds normal; no masses,  no organomegaly Extremities: extremities normal, atraumatic, no cyanosis or edema  Lab Results:  Basename 03/11/12 0530  WBC 8.7  HGB 11.6*  HCT 35.9*  PLT 192   BMET:  Basename 03/11/12 0530 03/10/12 0512  NA 138 141  K 3.2* 3.4*  CL 93* 93*  CO2 33* 42*  GLUCOSE 154* 145*  BUN 45* 55*  CREATININE 1.27 1.63*  CALCIUM 7.3* 8.2*   No results found for this basename: PTH:2 in the last 72 hours Iron Studies: No results found for this basename: IRON,TIBC,TRANSFERRIN,FERRITIN in the last 72 hours  Studies/Results: No results found.  I have reviewed the patient's current medications.  Assessment/Plan: Problem #1 acute kidney injury his BUN is 45 creatinine is 1.7 renal function seems to be improving. Presently patient does not have any uremic sinus symptoms. Problem #2 hypokalemia potassium is 3.2 most likely secondary to Lasix he is on potassium supplement. Problem #3 history of COPD Problem #4 history of respiratory failure presently patient patient has  improved. He problem #5 anemia mild his last hemoglobin is 11.6 hematocrit 35.9 stable. Problem #6 history of psoriatic this Problem #7 history of aneurysm. Plan: We'll change his potassium to 40 mEq by mouth twice a day We'll DC Lasix Since his renal function has improved  signoff thank you     LOS: 12 days   Deletha Jaffee S 03/12/2012,8:58 AM

## 2012-03-12 NOTE — Progress Notes (Signed)
Report called to A. Henderson LPN at [facility]. Pt. Wife to go back home for oxygen for pt. To travel to [facility].

## 2012-03-12 NOTE — Progress Notes (Signed)
He says he feels well and has no complaints. I think he is ready for discharge to skilled care facility today. He understands the plan plans are to begin rehabilitation and then return home

## 2012-03-12 NOTE — Discharge Summary (Signed)
Physician Discharge Summary  Patient ID: [name] MRN: [id number] DOB/AGE: 1955/08/02 57 y.o. Primary Care Physician:PATTERSON, KATHY, FNP Admit date: 02/29/2012 Discharge date: 03/12/2012    Discharge Diagnoses:   Principal Problem:  *Respiratory failure, acute Active Problems:  CHRONIC OBSTRUCTIVE PULMONARY DISEASE  Chronic kidney disease, stage 2, mildly decreased GFR  COPD exacerbation  Sinus tachycardia  Substance abuse  Acute on chronic renal failure  Influenza A (H1N1)  Community acquired pneumonia  SIRS due to infectious process with acute organ dysfunction     Medication List     As of 03/12/2012  8:58 AM    STOP taking these medications         ADVAIR DISKUS 250-50 MCG/DOSE Aepb   Generic drug: Fluticasone-Salmeterol      cetirizine 10 MG tablet   Commonly known as: ZYRTEC      cloNIDine 0.2 MG tablet   Commonly known as: CATAPRES      HYDROcodone-acetaminophen 5-325 MG per tablet   Commonly known as: NORCO/VICODIN      ipratropium-albuterol 0.5-2.5 (3) MG/3ML Soln   Commonly known as: DUONEB      lisinopril 40 MG tablet   Commonly known as: PRINIVIL,ZESTRIL      methotrexate 2.5 MG tablet   Commonly known as: RHEUMATREX      predniSONE 10 MG tablet   Commonly known as: DELTASONE      ranitidine 300 MG tablet   Commonly known as: ZANTAC      VENTOLIN HFA 108 (90 BASE) MCG/ACT inhaler   Generic drug: albuterol      Vitamin D (Ergocalciferol) 50000 UNITS Caps   Commonly known as: DRISDOL        Discharged Condition: Improved    Consults: Nephrology  Significant Diagnostic Studies: US Renal  03/02/2012  *RADIOLOGY REPORT*  Clinical Data: Elevated BUN and creatinine  RENAL/URINARY TRACT ULTRASOUND COMPLETE  Comparison:  None.  Findings:  Right Kidney:  No hydronephrosis is seen.  The right kidney measures 11.4 cm sagittally.  The echogenicity of the renal parenchyma is increased consistent with chronic renal medical disease.   There is cortical thinning present and multiple renal cysts are noted the largest in the midportion of 3.7 x 3.2 x 2.9 cm.  Left Kidney:  No hydronephrosis is seen.  The left kidney measures 11.5 cm sagittally and also is echogenic consistent with chronic renal medical disease.  Multiple cysts are present the largest in the upper pole of 3.1 x 2.5 x 2.6 cm.  Bladder:  The urinary bladder is decompressed with Foley catheter.  IMPRESSION:  1.  No hydronephrosis. 2.  Echogenic renal parenchyma consistent with chronic renal medical disease. 3.  Bilateral renal cysts.   Original Report Authenticated By: Dwyane Dee, M.D.    Dg Chest Port 1 View  03/06/2012  *RADIOLOGY REPORT*  Clinical Data: Respiratory failure  PORTABLE CHEST - 1 VIEW  Comparison: Yesterday  Findings: Stable endotracheal tube, NG tube, left PICC.  Upper normal heart size.  Mild atelectasis at the right base stable. Lungs otherwise clear.  No pneumothorax.  IMPRESSION: Stable atelectasis at the right base.   Original Report Authenticated By: Jolaine Click, M.D.    Dg Chest Port 1 View  03/05/2012  *RADIOLOGY REPORT*  Clinical Data: Respiratory failure.  PORTABLE CHEST - 1 VIEW  Comparison: Chest x-ray 03/04/2012.  Findings: An endotracheal tube is in place with tip 5.3 cm above the carina. A nasogastric tube is seen extending into the stomach, however, the tip  of the nasogastric tube extends below the lower margin of the image.  There is a left upper extremity PICC with tip terminating in the mid superior vena cava. No definite acute consolidated air space disease.  Probable small right pleural effusion.  Pulmonary vasculature is normal.  Heart size is within normal limits. The patient is rotated to the right on today's exam, resulting in distortion of the mediastinal contours and reduced diagnostic sensitivity and specificity for mediastinal pathology. Atherosclerosis in the thoracic aorta.  IMPRESSION: 1.  Support apparatus, as above. 2.  Small  right pleural effusion. 3.  Atherosclerosis.   Original Report Authenticated By: Trudie Reed, M.D.    Dg Chest Port 1 View  03/04/2012  *RADIOLOGY REPORT*  Clinical Data: Respiratory failure  PORTABLE CHEST - 1 VIEW  Comparison: Portable chest x-ray of 03/03/2012  Findings: The tip of the endotracheal tube is approximately 4.6 cm above the carina.  Aeration of the lungs has improved with decreasing basilar atelectasis.  Cardiomegaly is stable.  The left central venous line coils in the mid SVC.  IMPRESSION:  1.  Improved aeration. 2.  Endotracheal tip 4.6 cm above the carina. 3.  Left central venous line coils in the mid upper SVC.   Original Report Authenticated By: Dwyane Dee, M.D.    Dg Chest Port 1 View  03/03/2012  *RADIOLOGY REPORT*  Clinical Data: Worsening respiratory condition.  PORTABLE CHEST - 1 VIEW  Comparison: Chest radiograph performed 03/02/2012  Findings: The patient's endotracheal tube is seen ending approximately 5 cm above the carina.  A left PICC is noted ending likely about the proximal SVC.  An enteric tube is noted extending below the diaphragm.  The lungs are well-aerated.  Mildly worsened right basilar airspace opacity is seen, and there is mild retrocardiac opacity, raising concern for mild pneumonia.  No definite pleural effusion or pneumothorax is seen.  The cardiomediastinal silhouette is within normal limits.  No acute osseous abnormalities are seen.  IMPRESSION: Mildly worsened right basilar airspace opacity, and mild retrocardiac opacity, raising concern for mild pneumonia, though atelectasis could have a similar appearance.   Original Report Authenticated By: Tonia Ghent, M.D.    Portable Chest Xray In Am  03/02/2012  *RADIOLOGY REPORT*  Clinical Data: Ventilator protocol.  COPD.  PORTABLE CHEST - 1 VIEW  Comparison: 03/01/2012.  Findings:  The support apparatus is stable.  The heart and lungs are unchanged.  Underlying emphysematous changes with bibasilar scarring  atelectasis.  No edema or pneumothorax.  IMPRESSION: 1.  Stable support apparatus. 2.  Stable appearance of the heart and lungs.   Original Report Authenticated By: Rudie Meyer, M.D.    Dg Chest Portable 1 View  03/01/2012  *RADIOLOGY REPORT*  Clinical Data: Respiratory distress  PORTABLE CHEST - 1 VIEW  Comparison: 12/06/2011  Findings: Endotracheal tube tip 4 cm proximal to the carina. Cardiomegaly is similar to prior.  Mediastinal prominence has increased. There is evidence of underlying aortic pathology with ectasia and tortuosity.  Mild increased perihilar/ interstitial markings superimposed on chronic peribronchial thickening.  No pleural effusion or pneumothorax.  No acute osseous finding.  IMPRESSION: Mediastinal prominence. If there is clinical concern for acute aortic pathology, CT can be obtained.  Endotracheal tube tip 4 cm proximal to the carina.  Mild increased perihilar markings may reflect a mild edema or bronchitis superimposed on chronic bronchitic change.  Discussed via telephone with Dr. Colon Branch at 01:20 am on 12/31/2011.   Original Report Authenticated By: Jearld Lesch,  M.D.    Dg Chest Port 1v Same Day  03/02/2012  *RADIOLOGY REPORT*  Clinical Data: PICC placement  PORTABLE CHEST - 1 VIEW SAME DAY  Comparison: 03/02/2012  Findings: Left arm PICC is in place the tip in the SVC.  NG tube in the stomach.  Endotracheal tube in good position and unchanged.  Underlying emphysema is present.  Mild atelectasis in the lung bases.  IMPRESSION: Left PICC tip in the SVC.  No other changes.   Original Report Authenticated By: Janeece Riggers, M.D.    Dg Chest Port 1v Same Day  03/01/2012  *RADIOLOGY REPORT*  Clinical Data: Hypoxia, intubated  PORTABLE CHEST - 1 VIEW SAME DAY  Comparison: 03/01/2012  Findings: Endotracheal tube is appropriately positioned. Nasogastric tube terminates below the level of the diaphragms but the tip is not included on the film. Lungs are slightly better aerated with  improvement in presumed left lower lobe atelectasis. No new focal pulmonary opacity. Aorta is ectatic and unfolded. Heart size mildly enlarged.  IMPRESSION: Appropriate endotracheal tube position, improved aeration.   Original Report Authenticated By: Christiana Pellant, M.D.     Lab Results: Basic Metabolic Panel:  Basename 03/11/12 0530 03/10/12 0512  NA 138 141  K 3.2* 3.4*  CL 93* 93*  CO2 33* 42*  GLUCOSE 154* 145*  BUN 45* 55*  CREATININE 1.27 1.63*  CALCIUM 7.3* 8.2*  MG -- --  PHOS -- --   Liver Function Tests: No results found for this basename: AST:2,ALT:2,ALKPHOS:2,BILITOT:2,PROT:2,ALBUMIN:2 in the last 72 hours   CBC:  Basename 03/11/12 0530  WBC 8.7  NEUTROABS --  HGB 11.6*  HCT 35.9*  MCV 93.0  PLT 192    Recent Results (from the past 240 hour(s))  CULTURE, EXPECTORATED SPUTUM-ASSESSMENT     Status: Normal   Collection Time   03/06/12 11:44 PM      Component Value Range Status Comment   Specimen Description SPU   Final    Special Requests Normal   Final    Sputum evaluation     Final    Value: THIS SPECIMEN IS ACCEPTABLE. RESPIRATORY CULTURE REPORT TO FOLLOW.   Report Status 03/07/2012 FINAL   Final   CULTURE, RESPIRATORY     Status: Normal   Collection Time   03/06/12 11:44 PM      Component Value Range Status Comment   Specimen Description SPUTUM   Final    Special Requests NONE   Final    Gram Stain     Final    Value: FEW WBC PRESENT, PREDOMINANTLY PMN     NO SQUAMOUS EPITHELIAL CELLS SEEN     NO ORGANISMS SEEN   Culture FEW CANDIDA ALBICANS   Final    Report Status 03/09/2012 FINAL   Final      Hospital Course: He came to the emergency room with the stream respiratory distress and was intubated. He was found to have influenza and also had pneumonia. He remained on the ventilator for approximately 5-6 days. He was eventually able to wean off. His renal function improved. He had some trouble controlling his blood pressure during the  hospitalization. He eventually was able to ambulate some in the hall with physical therapy assistance. He was felt to need a rehabilitation stay and this has been arranged. He is at maximum hospital benefit and ready for transfer to skilled care facility  Discharge Exam: Blood pressure 149/93, pulse 75, temperature 98.1 F (36.7 C), temperature source Oral, resp. rate 20, height  6' (1.829 m), weight 104.3 kg (229 lb 15 oz), SpO2 97.00%. He is awake and alert. He is still weak. He is mildly dyspneic on exertion. His heart is regular.  Disposition: To skilled care facility He will have physical therapy occupational therapy and speech therapy as needed He will be on a carbohydrate modified diet. He will have Accu-Cheks a.c. and at bedtime and will be on facility protocol sliding scale coverage moderate sensitive He will be on Zithromax 500 mg IV every 24 hours for 5 more days Rocephin 1 g IV every 24 hours for 5 more days Catapres 0.2 mg 3 times a day Enoxaparin 50 mg every 24 hours until he is fully ambulatory Folic acid 1 mg daily Lasix 40 mg twice a day Xopenex 0.63 and Atrovent 0.5 mg every 6 hours by nebulizer Prednisone 40 mg daily x3 days then 30 mg daily x3 days then 20 mg daily x3 days and then 10 mg daily indefinitely Pantoprazole 40 mg daily Potassium chloride 20 mEq 3 times a day Vancomycin 1500 mg every 24 hours x5 more days. Monitoring of peak and trough vancomycin levels and renal function by facility pharmacy Methotrexate 2.5 mg 4 tablets every Saturday Advair disc 250/50 one puff twice a day Tylenol 650 mg by mouth every 4 hours when necessary pain        Discharge Orders    Future Orders Please Complete By Expires   Discharge to SNF when bed available      Scheduling Instructions:   See my discharge summary for medication orders        Signed: Fredirick Maudlin Pager [phone]  03/12/2012, 8:58 AM

## 2012-03-12 NOTE — Progress Notes (Signed)
Pt.'s wife back with oxygen, pt. Taken to car via W/C with pt's own oxygen going at 2 liters via nasal cannula. Chesley Mires LPN at [facility] notified of pt. Leaving.

## 2012-03-12 NOTE — Progress Notes (Signed)
Pt being discharged on Vancomycin x5 more days.  Called pharmacist at [facility] to communicate improvement in renal function and need for increase in Vancomycin dose.  They will check a Vancomycin trough level prior to next dose and increase accordingly.   Junita Push, PharmD, BCPS 03/12/2012@1 :32 PM

## 2012-03-12 NOTE — Clinical Social Work Placement (Signed)
Clinical Social Work Department CLINICAL SOCIAL WORK PLACEMENT NOTE 03/12/2012  Patient:  [name], [name]  Account Number:  [id number] Admit date:  02/29/2012  Clinical Social Worker:  Derenda Fennel, LCSW  Date/time:  03/10/2012 08:55 AM  Clinical Social Work is seeking post-discharge placement for this patient at the following level of care:   SKILLED NURSING   (*CSW will update this form in Epic as items are completed)   03/10/2012  Patient/family provided with [facility] Department of Clinical Social Work's list of facilities offering this level of care within the geographic area requested by the patient (or if unable, by the patient's family).  03/10/2012  Patient/family informed of their freedom to choose among providers that offer the needed level of care, that participate in Medicare, Medicaid or managed care program needed by the patient, have an available bed and are willing to accept the patient.  03/10/2012  Patient/family informed of [facility]' ownership interest in [facility], as well as of the fact that they are under no obligation to receive care at this facility.  PASARR submitted to EDS on 03/10/2012 PASARR number received from EDS on 03/10/2012  FL2 transmitted to all facilities in geographic area requested by pt/family on  03/10/2012 FL2 transmitted to all facilities within larger geographic area on 03/10/2012  Patient informed that his/her managed care company has contracts with or will negotiate with  certain facilities, including the following:     Patient/family informed of bed offers received:  03/10/2012 Patient chooses bed at [facility] Physician recommends and patient chooses bed at  [facility]  Patient to be transferred to [facility] [place] on  03/12/2012 Patient to be transferred to facility by [facility]  The following physician request were entered in Epic:   Additional Comments:  Derenda Fennel, LCSW [phone]

## 2012-03-12 NOTE — Clinical Social Work Note (Signed)
Pt d/c today to [facility]. Pt and facility aware and agreeable. Pt to transfer with family. D/C summary and FL2 faxed.  Derenda Fennel, [place] [phone]

## 2012-03-31 ENCOUNTER — Telehealth: Payer: Self-pay | Admitting: Orthopedic Surgery

## 2012-03-31 NOTE — Telephone Encounter (Signed)
Patient called asking for an appointment SAP due to swelling in left knee down to foot, said he cannot get his shoe on due to the swelling. Told him  Dr. Romeo Apple not in the office this week to call his primary care doctor to get this checked out.

## 2012-04-10 ENCOUNTER — Other Ambulatory Visit (HOSPITAL_COMMUNITY): Payer: Self-pay | Admitting: Nurse Practitioner

## 2012-04-10 NOTE — Telephone Encounter (Signed)
No additional notes added

## 2012-04-13 ENCOUNTER — Other Ambulatory Visit (HOSPITAL_COMMUNITY): Payer: Self-pay | Admitting: Nurse Practitioner

## 2012-04-13 DIAGNOSIS — M79606 Pain in leg, unspecified: Secondary | ICD-10-CM

## 2012-04-16 ENCOUNTER — Ambulatory Visit (HOSPITAL_COMMUNITY)
Admission: RE | Admit: 2012-04-16 | Discharge: 2012-04-16 | Disposition: A | Discharge status: Home / Self Care | Payer: Medicare HMO | Source: Ambulatory Visit | Attending: Nurse Practitioner | Admitting: Nurse Practitioner

## 2012-04-16 DIAGNOSIS — M79606 Pain in leg, unspecified: Secondary | ICD-10-CM

## 2012-04-16 DIAGNOSIS — M7989 Other specified soft tissue disorders: Secondary | ICD-10-CM | POA: Insufficient documentation

## 2012-04-16 DIAGNOSIS — M79609 Pain in unspecified limb: Secondary | ICD-10-CM | POA: Insufficient documentation

## 2012-04-30 ENCOUNTER — Ambulatory Visit (INDEPENDENT_AMBULATORY_CARE_PROVIDER_SITE_OTHER): Payer: Medicare Other | Admitting: Gastroenterology

## 2012-04-30 ENCOUNTER — Encounter: Payer: Self-pay | Admitting: Gastroenterology

## 2012-04-30 ENCOUNTER — Encounter (HOSPITAL_COMMUNITY): Payer: Self-pay | Admitting: Pharmacy Technician

## 2012-04-30 VITALS — BP 142/98 | HR 88 | Temp 97.4°F | Ht 69.0 in | Wt 232.8 lb

## 2012-04-30 DIAGNOSIS — R195 Other fecal abnormalities: Secondary | ICD-10-CM

## 2012-04-30 DIAGNOSIS — D649 Anemia, unspecified: Secondary | ICD-10-CM | POA: Insufficient documentation

## 2012-04-30 DIAGNOSIS — Z800 Family history of malignant neoplasm of digestive organs: Secondary | ICD-10-CM

## 2012-04-30 DIAGNOSIS — R1310 Dysphagia, unspecified: Secondary | ICD-10-CM

## 2012-04-30 MED ORDER — PEG 3350-KCL-NA BICARB-NACL 420 G PO SOLR: 4000.0000 mL | ORAL | Status: DC

## 2012-04-30 NOTE — Assessment & Plan Note (Signed)
Brother, age 58, deceased. Proceed with TCS.

## 2012-04-30 NOTE — Patient Instructions (Addendum)
We have scheduled you for a colonoscopy and upper endoscopy with Dr. Rourk in the near future.  Further recommendations to follow!  

## 2012-04-30 NOTE — Progress Notes (Signed)
Faxed to PCP

## 2012-04-30 NOTE — Assessment & Plan Note (Addendum)
New onset solid-food dysphagia, new onset reflux. GERD symptoms improved with Protonix. Dysphagia may be secondary to GERD, unable to exclude esophageal web, ring, or stricture.   Continue Protonix Proceed with upper endoscopy and dilation at time of TCS in the near future with Dr. Jena Gauss. The risks, benefits, and alternatives have been discussed in detail with patient. They have stated understanding and desire to proceed.  Phenergan 12.5 mg IV on call secondary to history of marijuana use

## 2012-04-30 NOTE — Assessment & Plan Note (Addendum)
57 year old male with mild anemia, Hgb 11.6, and heme positive stools. He was recently discharged from a prolonged hospitalization secondary to respiratory failure. His family history is significant for colon cancer; he states his brother was diagnosed at age 31 and deceased shortly thereafter. [name] has never had an lower or upper GI evaluation. No change in bowel habits, and he has noted low-volume hematochezia in the past. He will need lower GI evaluation in the near future, and concern for occult malignancy remains in the differential. Iron studies unknown at this time; he likely has a component of anemia of chronic disease as well.   Proceed with TCS with Dr. Jena Gauss in near future: the risks, benefits, and alternatives have been discussed with the patient in detail. The patient states understanding and desires to proceed. Phenergan 12.5 mg IV on call secondary to history of marijuana in the past

## 2012-04-30 NOTE — Progress Notes (Signed)
Referring Provider: Patterson, Kathy L, FNP Primary Care Physician:  PATTERSON, KATHY, FNP Primary Gastroenterologist:  Dr. Rourk   Chief Complaint  Patient presents with  . Anemia    HPI:   [name] is a 57-year-old male who presents today at the request of Kathy Patterson, NP, due to new onset anemia and heme positive stools. History of low-volume hematochezia in the past but none recently. No melena. Denies abdominal pain. Denies any change in bowel habits. No nausea or vomiting. Notes history of reflux, but he has started on Protonix per Dr. Hawkins a few weeks ago with improvement. Notes that if eats too fast, he feels like he is choking. Solid food dysphagia noted, new onset. Liquids and soft foods are easy to tolerate. Recently in the hospital with pneumonia, prolonged hospital course, lost weight during that time. Now is back to his baseline weight.   No prior colonoscopy. No prior EGD.     Past Medical History  Diagnosis Date  . Hypertension   . Dyspnea   . Chronic kidney disease, stage 2, mildly decreased GFR     Creatinine of 1.31 in 04/2011  . COPD (chronic obstructive pulmonary disease)     moderate by spirometry in 08/2009;FEV1 of 1.1 ;nl alpha -1 antitrypsin   . Obesity   . Psoriasis   . Left eye trauma     with loss of vision  . Erectile dysfunction   . Lumbosacral spinal stenosis     chronic low back pain  . Headache   . Sleep apnea     O2 at night  . Carotid artery aneurysm     Past Surgical History  Procedure Laterality Date  . Orif patella  12/14/2005    left fracture Dr.Harrison   . Ophthalmologic surgery  1963    secondary to left eye trauma, as a child hit in eye with tree limb     Current Outpatient Prescriptions  Medication Sig Dispense Refill  . ADVAIR DISKUS 250-50 MCG/DOSE AEPB 1 puff.       . cloNIDine (CATAPRES) 0.2 MG tablet Take 0.2 mg by mouth 2 (two) times daily.      . HYDROcodone-acetaminophen (NORCO/VICODIN) 5-325 MG per tablet  Take 1 tablet by mouth every 6 (six) hours as needed.       . lisinopril (PRINIVIL,ZESTRIL) 40 MG tablet Take 40 mg by mouth daily.       . pantoprazole (PROTONIX) 40 MG tablet Take 40 mg by mouth daily.       . predniSONE (DELTASONE) 10 MG tablet Take 10 mg by mouth daily.       . Vitamin D, Ergocalciferol, (DRISDOL) 50000 UNITS CAPS Take 50,000 Units by mouth 2 (two) times a week.       . polyethylene glycol-electrolytes (TRILYTE) 420 G solution Take 4,000 mLs by mouth as directed.  4000 mL  0  . [DISCONTINUED] amLODipine (NORVASC) 5 MG tablet Take 5 mg by mouth daily.         No current facility-administered medications for this visit.    Allergies as of 04/30/2012 - Review Complete 04/30/2012  Allergen Reaction Noted  . Bee venom Anaphylaxis 05/13/2011    Family History  Problem Relation Age of Onset  . Heart disease    . Arthritis    . Lung disease    . Cancer    . Asthma    . Colon cancer Brother     deceased at 43   . Brain cancer Brother       History   Social History  . Marital Status: Divorced    Spouse Name: N/A    Number of Children: N/A  . Years of Education: N/A   Occupational History  . disabled    Social History Main Topics  . Smoking status: Former Smoker -- 1.00 packs/day for 20 years    Quit date: 06/10/2010  . Smokeless tobacco: Never Used     Comment: Quit in 06/2010  . Alcohol Use: 0.5 oz/week    1 drink(s) per week     Comment: hx of ETOH abuse, none since hospitalization in December 2013   . Drug Use: Yes     Comment: history of marijuna use, none since December 2013   . Sexually Active: Not on file   Other Topics Concern  . Not on file   Social History Narrative  . No narrative on file    Review of Systems: Gen: see HPI CV: Denies chest pain, heart palpitations, syncope, peripheral edema. Resp: +DOE  GI: SEE HPI GU : Denies urinary burning, urinary frequency, urinary incontinence.  MS: left knee pain  Derm: Denies rash, itching,  dry skin Psych: Denies depression, anxiety, confusion or memory loss  Heme: Denies bruising, bleeding, and enlarged lymph nodes.  Physical Exam: BP 142/98  Pulse 88  Temp(Src) 97.4 F (36.3 C) (Oral)  Ht 5' 9" (1.753 m)  Wt 232 lb 12.8 oz (105.597 kg)  BMI 34.36 kg/m2 General:   Alert and oriented. Well-developed, well-nourished, pleasant and cooperative. Head:  Normocephalic and atraumatic. Eyes:  Conjunctiva pink, sclera clear, no icterus.   Left-eye blind.  Ears:  Normal auditory acuity. Nose:  No deformity, discharge,  or lesions. Mouth:  No deformity or lesions, mucosa pink and moist. Dentures noted.  Neck:  Supple, without mass or thyromegaly. Lungs:  Mild wheezing in posterior bases Heart:  S1, S2 present without murmurs noted.  Abdomen:  +BS, soft, non-tender and non-distended. Without mass or HSM. No rebound or guarding. Possible small umbilical hernia Rectal:  Deferred  Msk:  Symmetrical without gross deformities. Normal posture. Extremities:  Without clubbing or edema. Neurologic:  Alert and  oriented x4;  grossly normal neurologically. Skin:  Intact, warm and dry without significant lesions or rashes Cervical Nodes:  No significant cervical adenopathy. Psych:  Alert and cooperative. Normal mood and affect.  Labs reviewed: Lab Results  Component Value Date   WBC 8.7 03/11/2012   HGB 11.6* 03/11/2012   HCT 35.9* 03/11/2012   MCV 93.0 03/11/2012   PLT 192 03/11/2012     Chemistry      Component Value Date/Time   NA 138 03/11/2012 0530   K 3.2* 03/11/2012 0530   CL 93* 03/11/2012 0530   CO2 33* 03/11/2012 0530   BUN 45* 03/11/2012 0530   CREATININE 1.27 03/11/2012 0530      Component Value Date/Time   CALCIUM 7.3* 03/11/2012 0530   ALKPHOS 36* 03/06/2012 0425   AST 16 03/06/2012 0425   ALT 22 03/06/2012 0425   BILITOT 0.2* 03/06/2012 0425      

## 2012-05-04 ENCOUNTER — Encounter: Payer: Self-pay | Admitting: Internal Medicine

## 2012-05-04 ENCOUNTER — Other Ambulatory Visit: Payer: Self-pay | Admitting: Internal Medicine

## 2012-05-05 ENCOUNTER — Telehealth: Payer: Self-pay | Admitting: Internal Medicine

## 2012-05-05 NOTE — Telephone Encounter (Signed)
I spoke to patient and moved his procedure on 02/26 w/RMR to 3:45 telling him to arrive at 2:45 and he understands

## 2012-05-06 ENCOUNTER — Ambulatory Visit (HOSPITAL_COMMUNITY)
Admission: RE | Admit: 2012-05-06 | Discharge: 2012-05-06 | Disposition: A | Discharge status: Home / Self Care | Payer: Medicare HMO | Source: Ambulatory Visit | Attending: Internal Medicine | Admitting: Internal Medicine

## 2012-05-06 ENCOUNTER — Encounter (HOSPITAL_COMMUNITY)
Admission: RE | Disposition: A | Discharge status: Home / Self Care | Payer: Self-pay | Source: Ambulatory Visit | Attending: Internal Medicine

## 2012-05-06 ENCOUNTER — Encounter (HOSPITAL_COMMUNITY): Payer: Self-pay | Admitting: *Deleted

## 2012-05-06 DIAGNOSIS — D649 Anemia, unspecified: Secondary | ICD-10-CM

## 2012-05-06 DIAGNOSIS — D509 Iron deficiency anemia, unspecified: Secondary | ICD-10-CM | POA: Insufficient documentation

## 2012-05-06 DIAGNOSIS — Q391 Atresia of esophagus with tracheo-esophageal fistula: Secondary | ICD-10-CM | POA: Insufficient documentation

## 2012-05-06 DIAGNOSIS — I10 Essential (primary) hypertension: Secondary | ICD-10-CM | POA: Insufficient documentation

## 2012-05-06 DIAGNOSIS — K633 Ulcer of intestine: Secondary | ICD-10-CM

## 2012-05-06 DIAGNOSIS — J4489 Other specified chronic obstructive pulmonary disease: Secondary | ICD-10-CM | POA: Insufficient documentation

## 2012-05-06 DIAGNOSIS — R1310 Dysphagia, unspecified: Secondary | ICD-10-CM | POA: Insufficient documentation

## 2012-05-06 DIAGNOSIS — K219 Gastro-esophageal reflux disease without esophagitis: Secondary | ICD-10-CM

## 2012-05-06 DIAGNOSIS — K2970 Gastritis, unspecified, without bleeding: Secondary | ICD-10-CM

## 2012-05-06 DIAGNOSIS — K2990 Gastroduodenitis, unspecified, without bleeding: Secondary | ICD-10-CM

## 2012-05-06 DIAGNOSIS — K921 Melena: Secondary | ICD-10-CM | POA: Insufficient documentation

## 2012-05-06 DIAGNOSIS — Q393 Congenital stenosis and stricture of esophagus: Secondary | ICD-10-CM | POA: Insufficient documentation

## 2012-05-06 DIAGNOSIS — R195 Other fecal abnormalities: Secondary | ICD-10-CM

## 2012-05-06 DIAGNOSIS — Z800 Family history of malignant neoplasm of digestive organs: Secondary | ICD-10-CM

## 2012-05-06 DIAGNOSIS — K648 Other hemorrhoids: Secondary | ICD-10-CM | POA: Insufficient documentation

## 2012-05-06 DIAGNOSIS — J449 Chronic obstructive pulmonary disease, unspecified: Secondary | ICD-10-CM | POA: Insufficient documentation

## 2012-05-06 DIAGNOSIS — D126 Benign neoplasm of colon, unspecified: Secondary | ICD-10-CM

## 2012-05-06 HISTORY — PX: ESOPHAGOGASTRODUODENOSCOPY: SHX1529

## 2012-05-06 HISTORY — PX: COLONOSCOPY: SHX5424

## 2012-05-06 SURGERY — COLONOSCOPY
Anesthesia: Moderate Sedation

## 2012-05-06 MED ORDER — ONDANSETRON HCL 4 MG/2ML IJ SOLN
INTRAMUSCULAR | Status: AC
  Filled 2012-05-06: qty 2

## 2012-05-06 MED ORDER — STERILE WATER FOR IRRIGATION IR SOLN
Status: DC | PRN
  Administered 2012-05-06: 14:00:00

## 2012-05-06 MED ORDER — MEPERIDINE HCL 100 MG/ML IJ SOLN
INTRAMUSCULAR | Status: DC | PRN
  Administered 2012-05-06: 50 mg via INTRAVENOUS

## 2012-05-06 MED ORDER — ONDANSETRON HCL 4 MG/2ML IJ SOLN
INTRAMUSCULAR | Status: DC | PRN
  Administered 2012-05-06: 4 mg via INTRAVENOUS

## 2012-05-06 MED ORDER — PROMETHAZINE HCL 25 MG/ML IJ SOLN: 12.5000 mg | Freq: Once | INTRAMUSCULAR | Status: DC

## 2012-05-06 MED ORDER — MIDAZOLAM HCL 5 MG/5ML IJ SOLN
INTRAMUSCULAR | Status: DC | PRN
  Administered 2012-05-06: 1 mg via INTRAVENOUS
  Administered 2012-05-06: 2 mg via INTRAVENOUS

## 2012-05-06 MED ORDER — MIDAZOLAM HCL 5 MG/5ML IJ SOLN
INTRAMUSCULAR | Status: AC
  Filled 2012-05-06: qty 10

## 2012-05-06 MED ORDER — MEPERIDINE HCL 100 MG/ML IJ SOLN
INTRAMUSCULAR | Status: AC
  Filled 2012-05-06: qty 2

## 2012-05-06 MED ORDER — SODIUM CHLORIDE 0.45 % IV SOLN
INTRAVENOUS | Status: DC
  Administered 2012-05-06: 1000 mL via INTRAVENOUS

## 2012-05-06 MED ORDER — BUTAMBEN-TETRACAINE-BENZOCAINE 2-2-14 % EX AERO
INHALATION_SPRAY | CUTANEOUS | Status: DC | PRN
  Administered 2012-05-06: 2 via TOPICAL

## 2012-05-06 NOTE — Op Note (Signed)
[facility] [address] [place], [address]   ENDOSCOPY PROCEDURE REPORT  PATIENT: [name], [name]  MR#: [id number] BIRTHDATE: 12/20/1955 , 56  yrs. old GENDER: Male ENDOSCOPIST: R.  Roetta Sessions, MD FACP FACG REFERRED BY:  Kari Baars, M.D. PROCEDURE DATE:  05/06/2012 PROCEDURE:     EGD with Elease Hashimoto dilation followed by gastric biopsy  INDICATIONS:     GERD; dysphagia  INFORMED CONSENT:   The risks, benefits, limitations, alternatives and imponderables have been discussed.  The potential for biopsy, esophogeal dilation, etc. have also been reviewed.  Questions have been answered.  All parties agreeable.  Please see the history and physical in the medical record for more information.  MEDICATIONS:    Versed 3 mg IVand Demerol 75 mg IV in divided doses. Zofran 4 mg IV. Cetacaine spray  DESCRIPTION OF PROCEDURE:   The [id number] ([id number])  endoscope was introduced through the mouth and advanced to the second portion of the duodenum without difficulty or limitations.  The mucosal surfaces were surveyed very carefully during advancement of the scope and upon withdrawal.  Retroflexion view of the proximal stomach and esophagogastric junction was performed.     FINDINGS:  Normal appearing tubular esophagus. Stomach empty. Focal antral erosions. Small hiatal hernia. No ulcer or infiltrating process. Pylorus patent. Examination of thefirst and second portion of the duodenum revealed a couple tiny bulbar erosions only.  THERAPEUTIC / DIAGNOSTIC MANEUVERS PERFORMED:  A 56 French Maloney dilator was passed to full insertion easily. A look back revealed what appeared to be a cervical esophageal web  - ruptured with this maneuver. No apparent complications seen.Subsequently, biopsies of the abnormal antrum taken for histologic study.   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 Hiatal  hernia. Antral and bulbar erosions of uncertain significance-status post gastric biopsy  RECOMMENDATIONS:   Followup on pathology. Blood pressure running a little high. Recommend file Dr. Juanetta Gosling. See colonoscopy report.    _______________________________ R. Roetta Sessions, MD FACP [facility] eSigned:  R. Roetta Sessions, MD FACP [facility] 05/06/2012 2:39 PM     CC:  PATIENT NAME:  [name], [name] MR#: [id number]

## 2012-05-06 NOTE — H&P (View-Only) (Signed)
Faxed to PCP

## 2012-05-06 NOTE — H&P (View-Only) (Signed)
Referring Provider: Jeni Salles, FNP Primary Care Physician:  Ninfa Linden, FNP Primary Gastroenterologist:  Dr. Jena Gauss   Chief Complaint  Patient presents with  . Anemia    HPI:   [name] is a 57 year old male who presents today at the request of Ninfa Linden, NP, due to new onset anemia and heme positive stools. History of low-volume hematochezia in the past but none recently. No melena. Denies abdominal pain. Denies any change in bowel habits. No nausea or vomiting. Notes history of reflux, but he has started on Protonix per Dr. Juanetta Gosling a few weeks ago with improvement. Notes that if eats too fast, he feels like he is choking. Solid food dysphagia noted, new onset. Liquids and soft foods are easy to tolerate. Recently in the hospital with pneumonia, prolonged hospital course, lost weight during that time. Now is back to his baseline weight.   No prior colonoscopy. No prior EGD.     Past Medical History  Diagnosis Date  . Hypertension   . Dyspnea   . Chronic kidney disease, stage 2, mildly decreased GFR     Creatinine of 1.31 in 04/2011  . COPD (chronic obstructive pulmonary disease)     moderate by spirometry in 08/2009;FEV1 of 1.1 ;nl alpha -1 antitrypsin   . Obesity   . Psoriasis   . Left eye trauma     with loss of vision  . Erectile dysfunction   . Lumbosacral spinal stenosis     chronic low back pain  . Headache   . Sleep apnea     O2 at night  . Carotid artery aneurysm     Past Surgical History  Procedure Laterality Date  . Orif patella  12/14/2005    left fracture Dr.Harrison   . Ophthalmologic surgery  1963    secondary to left eye trauma, as a child hit in eye with tree limb     Current Outpatient Prescriptions  Medication Sig Dispense Refill  . ADVAIR DISKUS 250-50 MCG/DOSE AEPB 1 puff.       . cloNIDine (CATAPRES) 0.2 MG tablet Take 0.2 mg by mouth 2 (two) times daily.      Marland Kitchen HYDROcodone-acetaminophen (NORCO/VICODIN) 5-325 MG per tablet  Take 1 tablet by mouth every 6 (six) hours as needed.       Marland Kitchen lisinopril (PRINIVIL,ZESTRIL) 40 MG tablet Take 40 mg by mouth daily.       . pantoprazole (PROTONIX) 40 MG tablet Take 40 mg by mouth daily.       . predniSONE (DELTASONE) 10 MG tablet Take 10 mg by mouth daily.       . Vitamin D, Ergocalciferol, (DRISDOL) 50000 UNITS CAPS Take 50,000 Units by mouth 2 (two) times a week.       . polyethylene glycol-electrolytes (TRILYTE) 420 G solution Take 4,000 mLs by mouth as directed.  4000 mL  0  . [DISCONTINUED] amLODipine (NORVASC) 5 MG tablet Take 5 mg by mouth daily.         No current facility-administered medications for this visit.    Allergies as of 04/30/2012 - Review Complete 04/30/2012  Allergen Reaction Noted  . Bee venom Anaphylaxis 05/13/2011    Family History  Problem Relation Age of Onset  . Heart disease    . Arthritis    . Lung disease    . Cancer    . Asthma    . Colon cancer Brother     deceased at 21   . Brain cancer Brother  History   Social History  . Marital Status: Divorced    Spouse Name: N/A    Number of Children: N/A  . Years of Education: N/A   Occupational History  . disabled    Social History Main Topics  . Smoking status: Former Smoker -- 1.00 packs/day for 20 years    Quit date: 06/10/2010  . Smokeless tobacco: Never Used     Comment: Quit in 06/2010  . Alcohol Use: 0.5 oz/week    1 drink(s) per week     Comment: hx of ETOH abuse, none since hospitalization in December 2013   . Drug Use: Yes     Comment: history of marijuna use, none since December 2013   . Sexually Active: Not on file   Other Topics Concern  . Not on file   Social History Narrative  . No narrative on file    Review of Systems: Gen: see HPI CV: Denies chest pain, heart palpitations, syncope, peripheral edema. Resp: +DOE  GI: SEE HPI GU : Denies urinary burning, urinary frequency, urinary incontinence.  MS: left knee pain  Derm: Denies rash, itching,  dry skin Psych: Denies depression, anxiety, confusion or memory loss  Heme: Denies bruising, bleeding, and enlarged lymph nodes.  Physical Exam: BP 142/98  Pulse 88  Temp(Src) 97.4 F (36.3 C) (Oral)  Ht 5\' 9"  (1.753 m)  Wt 232 lb 12.8 oz (105.597 kg)  BMI 34.36 kg/m2 General:   Alert and oriented. Well-developed, well-nourished, pleasant and cooperative. Head:  Normocephalic and atraumatic. Eyes:  Conjunctiva pink, sclera clear, no icterus.   Left-eye blind.  Ears:  Normal auditory acuity. Nose:  No deformity, discharge,  or lesions. Mouth:  No deformity or lesions, mucosa pink and moist. Dentures noted.  Neck:  Supple, without mass or thyromegaly. Lungs:  Mild wheezing in posterior bases Heart:  S1, S2 present without murmurs noted.  Abdomen:  +BS, soft, non-tender and non-distended. Without mass or HSM. No rebound or guarding. Possible small umbilical hernia Rectal:  Deferred  Msk:  Symmetrical without gross deformities. Normal posture. Extremities:  Without clubbing or edema. Neurologic:  Alert and  oriented x4;  grossly normal neurologically. Skin:  Intact, warm and dry without significant lesions or rashes Cervical Nodes:  No significant cervical adenopathy. Psych:  Alert and cooperative. Normal mood and affect.  Labs reviewed: Lab Results  Component Value Date   WBC 8.7 03/11/2012   HGB 11.6* 03/11/2012   HCT 35.9* 03/11/2012   MCV 93.0 03/11/2012   PLT 192 03/11/2012     Chemistry      Component Value Date/Time   NA 138 03/11/2012 0530   K 3.2* 03/11/2012 0530   CL 93* 03/11/2012 0530   CO2 33* 03/11/2012 0530   BUN 45* 03/11/2012 0530   CREATININE 1.27 03/11/2012 0530      Component Value Date/Time   CALCIUM 7.3* 03/11/2012 0530   ALKPHOS 36* 03/06/2012 0425   AST 16 03/06/2012 0425   ALT 22 03/06/2012 0425   BILITOT 0.2* 03/06/2012 0425

## 2012-05-06 NOTE — Interval H&P Note (Signed)
History and Physical Interval Note:  05/06/2012 1:49 PM  [name]  has presented today for surgery, with the diagnosis of ANEMIA, HEME POSITIVE STOOL AND FAMILY HX OF COLON CA  The various methods of treatment have been discussed with the patient and family. After consideration of risks, benefits and other options for treatment, the patient has consented to  Procedure(s) with comments: COLONOSCOPY (N/A) - 1:15 as a surgical intervention .  The patient's history has been reviewed, patient examined, no change in status, stable for surgery.  I have reviewed the patient's chart and labs.  Questions were answered to the patient's satisfaction.     Eula Listen  Colonoscopy per plan

## 2012-05-06 NOTE — Interval H&P Note (Signed)
History and Physical Interval Note:  05/06/2012 1:51 PM  [name]  has presented today for surgery, with the diagnosis of ANEMIA, HEME POSITIVE STOOL AND FAMILY HX OF COLON CA  The various methods of treatment have been discussed with the patient and family. After consideration of risks, benefits and other options for treatment, the patient has consented to  Procedure(s) with comments: COLONOSCOPY (N/A) - 1:15 as a surgical intervention .  The patient's history has been reviewed, patient examined, no change in status, stable for surgery.  I have reviewed the patient's chart and labs.  Questions were answered to the patient's satisfaction.     [name]  Colonoscopy per plan.The risks, benefits, limitations, alternatives and imponderables have been reviewed with the patient. Questions have been answered. All parties are agreeable.

## 2012-05-06 NOTE — Interval H&P Note (Signed)
History and Physical Interval Note:  05/06/2012 1:57 PM  JARQUAVIOUS FENTRESS  has presented today for surgery, with the diagnosis of ANEMIA, HEME POSITIVE STOOL AND FAMILY HX OF COLON CA  The various methods of treatment have been discussed with the patient and family. After consideration of risks, benefits and other options for treatment, the patient has consented to  Procedure(s) with comments: COLONOSCOPY (N/A) - 1:15 as a surgical intervention .  The patient's history has been reviewed, patient examined, no change in status, stable for surgery.  I have reviewed the patient's chart and labs.  Questions were answered to the patient's satisfaction.     Eula Listen  Colonoscopy per plan. Also an EGD to evaluate dysphagia with esophageal dilation as appropriate.. This approach as discussed with patient.  The risks, benefits, limitations, alternatives and imponderables have been reviewed with the patient. Potential for esophageal dilation, biopsy, etc. have also been reviewed.  Questions have been answered. All parties agreeable.

## 2012-05-06 NOTE — Op Note (Signed)
[facility] [address] [place], [address]   COLONOSCOPY PROCEDURE REPORT  PATIENT: [name], [name]  MR#:         [id number] BIRTHDATE: 18-May-1955 , 56  yrs. old GENDER: Male ENDOSCOPIST: R.  Roetta Sessions, MD FACP FACG REFERRED BY:  Kari Baars, M.D. PROCEDURE DATE:  05/06/2012 PROCEDURE:     ileocolonoscopy withbiopsy and snare polypectomy  INDICATIONS:  anemia; Hemoccult positive stool; first ever colonoscopy  INFORMED CONSENT:  The risks, benefits, alternatives and imponderables including but not limited to bleeding, perforation as well as the possibility of a missed lesion have been reviewed.  The potential for biopsy, lesion removal, etc. have also been discussed.  Questions have been answered.  All parties agreeable. Please see the history and physical in the medical record for more information.  MEDICATIONS: Versed 3 mg IV and Demerol 50 mg IV in divided doses. Zofran 4 mg IV  DESCRIPTION OF PROCEDURE:  After a digital rectal exam was performed, the EC-[id number] ([id number])  colonoscope was advanced from the anus through the rectum and colon to the area of the cecum, ileocecal valve and appendiceal orifice.  The cecum was deeply intubated.  These structures were well-seen and photographed for the record.  From the level of the cecum and ileocecal valve, the scope was slowly and cautiously withdrawn.  The mucosal surfaces were carefully surveyed utilizing scope tip deflection to facilitate fold flattening as needed.  The scope was pulled down into the rectum where a thorough examination including retroflexion was performed.    FINDINGS:  Adequate preparation. Anal papilla and internal hemorrhoids; otherwise normal rectum. Patient 1-2 mm ulcers ulcerations in the base of the cecum along with a 5 mm polyp; the remainder the colonic mucosa appeared normal. The distal 10 cm of terminal ileal mucosa appeared normal.  THERAPEUTIC / DIAGNOSTIC  MANEUVERS PERFORMED:  The abnormal cecal mucosa was biopsied for histologic study.. Subsequently, the cecal polyp was cold snare removed and submitted separately.  COMPLICATIONS: none  CECAL WITHDRAWAL TIME:  10 minutes  IMPRESSION:  Internal hemorrhoids. Cecal polyp-removed as described above. Cecal ulcerations of uncertain significance-status post biopsy  RECOMMENDATIONS: Followup on pathology. See EGD report.   _______________________________ eSigned:  R. Roetta Sessions, MD FACP [facility] 05/06/2012 2:23 PM   CC:    PATIENT NAME:  Shaheer, Bonfield MR#: [id number]

## 2012-05-11 ENCOUNTER — Encounter (HOSPITAL_COMMUNITY): Payer: Self-pay | Admitting: Internal Medicine

## 2012-05-12 ENCOUNTER — Encounter: Payer: Self-pay | Admitting: Internal Medicine

## 2012-05-12 DIAGNOSIS — J4489 Other specified chronic obstructive pulmonary disease: Secondary | ICD-10-CM

## 2012-05-12 DIAGNOSIS — J449 Chronic obstructive pulmonary disease, unspecified: Secondary | ICD-10-CM

## 2012-05-13 ENCOUNTER — Encounter: Payer: Self-pay | Admitting: *Deleted

## 2012-05-14 ENCOUNTER — Other Ambulatory Visit: Payer: Self-pay | Admitting: *Deleted

## 2012-05-14 MED ORDER — HYDROCODONE-ACETAMINOPHEN 5-325 MG PO TABS: 1.0000 | ORAL_TABLET | Freq: Four times a day (QID) | ORAL | Status: DC | PRN

## 2012-05-18 ENCOUNTER — Telehealth: Payer: Self-pay | Admitting: *Deleted

## 2012-05-18 NOTE — Telephone Encounter (Signed)
Opened in error

## 2012-07-20 ENCOUNTER — Encounter (HOSPITAL_COMMUNITY): Payer: Self-pay

## 2012-07-20 ENCOUNTER — Inpatient Hospital Stay (HOSPITAL_COMMUNITY)
Admission: EM | Admit: 2012-07-20 | Discharge: 2012-07-23 | DRG: 291 | Disposition: A | Discharge status: Home Health | Payer: Medicare HMO | Attending: Pulmonary Disease | Admitting: Pulmonary Disease

## 2012-07-20 ENCOUNTER — Emergency Department (HOSPITAL_COMMUNITY): Payer: Medicare HMO

## 2012-07-20 ENCOUNTER — Inpatient Hospital Stay (HOSPITAL_COMMUNITY): Payer: Medicare HMO

## 2012-07-20 DIAGNOSIS — Z87891 Personal history of nicotine dependence: Secondary | ICD-10-CM

## 2012-07-20 DIAGNOSIS — I5033 Acute on chronic diastolic (congestive) heart failure: Principal | ICD-10-CM

## 2012-07-20 DIAGNOSIS — Z9981 Dependence on supplemental oxygen: Secondary | ICD-10-CM

## 2012-07-20 DIAGNOSIS — D649 Anemia, unspecified: Secondary | ICD-10-CM

## 2012-07-20 DIAGNOSIS — I129 Hypertensive chronic kidney disease with stage 1 through stage 4 chronic kidney disease, or unspecified chronic kidney disease: Secondary | ICD-10-CM | POA: Diagnosis present

## 2012-07-20 DIAGNOSIS — J9600 Acute respiratory failure, unspecified whether with hypoxia or hypercapnia: Secondary | ICD-10-CM | POA: Diagnosis present

## 2012-07-20 DIAGNOSIS — N182 Chronic kidney disease, stage 2 (mild): Secondary | ICD-10-CM | POA: Diagnosis present

## 2012-07-20 DIAGNOSIS — L408 Other psoriasis: Secondary | ICD-10-CM | POA: Diagnosis present

## 2012-07-20 DIAGNOSIS — I509 Heart failure, unspecified: Secondary | ICD-10-CM

## 2012-07-20 DIAGNOSIS — IMO0002 Reserved for concepts with insufficient information to code with codable children: Secondary | ICD-10-CM

## 2012-07-20 DIAGNOSIS — R809 Proteinuria, unspecified: Secondary | ICD-10-CM | POA: Diagnosis present

## 2012-07-20 DIAGNOSIS — J449 Chronic obstructive pulmonary disease, unspecified: Secondary | ICD-10-CM | POA: Diagnosis present

## 2012-07-20 DIAGNOSIS — J4489 Other specified chronic obstructive pulmonary disease: Secondary | ICD-10-CM

## 2012-07-20 DIAGNOSIS — Z79899 Other long term (current) drug therapy: Secondary | ICD-10-CM

## 2012-07-20 DIAGNOSIS — J441 Chronic obstructive pulmonary disease with (acute) exacerbation: Secondary | ICD-10-CM

## 2012-07-20 LAB — CBC WITH DIFFERENTIAL/PLATELET
Basophils Absolute: 0 10*3/uL (ref 0.0–0.1)
Eosinophils Relative: 1 % (ref 0–5)
HCT: 35.4 % — ABNORMAL LOW (ref 39.0–52.0)
Lymphocytes Relative: 10 % — ABNORMAL LOW (ref 12–46)
Lymphs Abs: 0.6 10*3/uL — ABNORMAL LOW (ref 0.7–4.0)
MCV: 92.4 fL (ref 78.0–100.0)
Monocytes Absolute: 0.3 10*3/uL (ref 0.1–1.0)
RDW: 12.6 % (ref 11.5–15.5)
WBC: 5.9 10*3/uL (ref 4.0–10.5)

## 2012-07-20 LAB — TROPONIN I: Troponin I: <0.3 ng/mL (ref <0.30)

## 2012-07-20 LAB — BASIC METABOLIC PANEL
CO2: 29 mEq/L (ref 19–32)
Calcium: 9 mg/dL (ref 8.4–10.5)
Creatinine, Ser: 1.34 mg/dL (ref 0.50–1.35)
Glucose, Bld: 112 mg/dL — ABNORMAL HIGH (ref 70–99)

## 2012-07-20 MED ORDER — METHOTREXATE 2.5 MG PO TABS: 10.0000 mg | ORAL_TABLET | ORAL | Status: DC

## 2012-07-20 MED ORDER — IPRATROPIUM BROMIDE 0.02 % IN SOLN
0.5000 mg | Freq: Once | RESPIRATORY_TRACT | Status: AC
  Administered 2012-07-20: 0.5 mg via RESPIRATORY_TRACT
  Filled 2012-07-20: qty 2.5

## 2012-07-20 MED ORDER — CARVEDILOL 3.125 MG PO TABS
6.2500 mg | ORAL_TABLET | Freq: Two times a day (BID) | ORAL | Status: DC
  Administered 2012-07-20 – 2012-07-23 (×6): 6.25 mg via ORAL
  Filled 2012-07-20 (×6): qty 2

## 2012-07-20 MED ORDER — LISINOPRIL 10 MG PO TABS
40.0000 mg | ORAL_TABLET | Freq: Every day | ORAL | Status: DC
Start: 2012-07-20 — End: 2012-07-23
  Administered 2012-07-21 – 2012-07-22 (×2): 40 mg via ORAL
  Filled 2012-07-20 (×3): qty 4

## 2012-07-20 MED ORDER — SODIUM CHLORIDE 0.9 % IJ SOLN
3.0000 mL | Freq: Two times a day (BID) | INTRAMUSCULAR | Status: DC
  Administered 2012-07-21 – 2012-07-23 (×5): 3 mL via INTRAVENOUS

## 2012-07-20 MED ORDER — ONDANSETRON HCL 4 MG/2ML IJ SOLN: 4.0000 mg | Freq: Four times a day (QID) | INTRAMUSCULAR | Status: DC | PRN

## 2012-07-20 MED ORDER — MOMETASONE FURO-FORMOTEROL FUM 100-5 MCG/ACT IN AERO
2.0000 | INHALATION_SPRAY | Freq: Two times a day (BID) | RESPIRATORY_TRACT | Status: DC
  Filled 2012-07-20: qty 8.8

## 2012-07-20 MED ORDER — ONDANSETRON HCL 4 MG PO TABS: 4.0000 mg | ORAL_TABLET | Freq: Four times a day (QID) | ORAL | Status: DC | PRN

## 2012-07-20 MED ORDER — VITAMIN D (ERGOCALCIFEROL) 1.25 MG (50000 UNIT) PO CAPS
50000.0000 [IU] | ORAL_CAPSULE | ORAL | Status: DC
  Administered 2012-07-23: 50000 [IU] via ORAL
  Filled 2012-07-20 (×2): qty 1

## 2012-07-20 MED ORDER — GI COCKTAIL ~~LOC~~: 30.0000 mL | Freq: Three times a day (TID) | ORAL | Status: DC | PRN

## 2012-07-20 MED ORDER — ASPIRIN EC 81 MG PO TBEC
81.0000 mg | DELAYED_RELEASE_TABLET | Freq: Every day | ORAL | Status: DC
  Administered 2012-07-20 – 2012-07-23 (×4): 81 mg via ORAL
  Filled 2012-07-20 (×5): qty 1

## 2012-07-20 MED ORDER — ALBUTEROL SULFATE (5 MG/ML) 0.5% IN NEBU
5.0000 mg | INHALATION_SOLUTION | Freq: Once | RESPIRATORY_TRACT | Status: AC
  Administered 2012-07-20: 5 mg via RESPIRATORY_TRACT
  Filled 2012-07-20: qty 1

## 2012-07-20 MED ORDER — NITROGLYCERIN 2 % TD OINT
1.0000 [in_us] | TOPICAL_OINTMENT | Freq: Four times a day (QID) | TRANSDERMAL | Status: DC
  Administered 2012-07-20 – 2012-07-23 (×12): 1 [in_us] via TOPICAL
  Filled 2012-07-20 (×13): qty 1

## 2012-07-20 MED ORDER — FUROSEMIDE 10 MG/ML IJ SOLN
10.0000 mg/h | INTRAVENOUS | Status: AC
  Administered 2012-07-20: 10 mg/h via INTRAVENOUS
  Filled 2012-07-20: qty 25

## 2012-07-20 MED ORDER — LEVOFLOXACIN 750 MG PO TABS
750.0000 mg | ORAL_TABLET | Freq: Every day | ORAL | Status: DC
  Administered 2012-07-20 – 2012-07-23 (×4): 750 mg via ORAL
  Filled 2012-07-20 (×4): qty 1

## 2012-07-20 MED ORDER — PANTOPRAZOLE SODIUM 40 MG PO TBEC
40.0000 mg | DELAYED_RELEASE_TABLET | Freq: Two times a day (BID) | ORAL | Status: DC
  Administered 2012-07-20 – 2012-07-23 (×6): 40 mg via ORAL
  Filled 2012-07-20 (×6): qty 1

## 2012-07-20 MED ORDER — HYDROCODONE-ACETAMINOPHEN 5-325 MG PO TABS: 1.0000 | ORAL_TABLET | ORAL | Status: DC | PRN

## 2012-07-20 MED ORDER — METHYLPREDNISOLONE SODIUM SUCC 125 MG IJ SOLR: 60.0000 mg | Freq: Three times a day (TID) | INTRAMUSCULAR | Status: DC

## 2012-07-20 MED ORDER — FUROSEMIDE 10 MG/ML IJ SOLN
80.0000 mg | Freq: Once | INTRAMUSCULAR | Status: AC
  Administered 2012-07-20: 80 mg via INTRAVENOUS
  Filled 2012-07-20: qty 8

## 2012-07-20 MED ORDER — POLYETHYLENE GLYCOL 3350 17 G PO PACK
17.0000 g | PACK | Freq: Every day | ORAL | Status: DC | PRN
  Filled 2012-07-20: qty 1

## 2012-07-20 MED ORDER — CLONIDINE HCL 0.2 MG PO TABS
0.2000 mg | ORAL_TABLET | Freq: Three times a day (TID) | ORAL | Status: DC
  Administered 2012-07-20 – 2012-07-23 (×8): 0.2 mg via ORAL
  Filled 2012-07-20 (×10): qty 1

## 2012-07-20 MED ORDER — DOCUSATE SODIUM 100 MG PO CAPS
200.0000 mg | ORAL_CAPSULE | Freq: Two times a day (BID) | ORAL | Status: DC
  Administered 2012-07-20 – 2012-07-23 (×7): 200 mg via ORAL
  Filled 2012-07-20: qty 1
  Filled 2012-07-20 (×5): qty 2
  Filled 2012-07-20: qty 1
  Filled 2012-07-20: qty 2

## 2012-07-20 MED ORDER — MOMETASONE FURO-FORMOTEROL FUM 100-5 MCG/ACT IN AERO
2.0000 | INHALATION_SPRAY | Freq: Two times a day (BID) | RESPIRATORY_TRACT | Status: DC
  Administered 2012-07-20 – 2012-07-23 (×6): 2 via RESPIRATORY_TRACT
  Filled 2012-07-20: qty 8.8

## 2012-07-20 MED ORDER — GI COCKTAIL ~~LOC~~
30.0000 mL | Freq: Once | ORAL | Status: AC
  Administered 2012-07-20: 30 mL via ORAL
  Filled 2012-07-20: qty 30

## 2012-07-20 MED ORDER — FAMOTIDINE 20 MG PO TABS
40.0000 mg | ORAL_TABLET | Freq: Every day | ORAL | Status: DC
  Administered 2012-07-20: 40 mg via ORAL
  Filled 2012-07-20 (×2): qty 1

## 2012-07-20 MED ORDER — ALBUTEROL SULFATE (5 MG/ML) 0.5% IN NEBU: 2.5000 mg | INHALATION_SOLUTION | RESPIRATORY_TRACT | Status: DC | PRN

## 2012-07-20 MED ORDER — METHYLPREDNISOLONE SODIUM SUCC 125 MG IJ SOLR
60.0000 mg | Freq: Three times a day (TID) | INTRAMUSCULAR | Status: DC
  Administered 2012-07-20 – 2012-07-23 (×8): 60 mg via INTRAVENOUS
  Filled 2012-07-20 (×8): qty 2

## 2012-07-20 MED ORDER — GUAIFENESIN-DM 100-10 MG/5ML PO SYRP: 5.0000 mL | ORAL_SOLUTION | ORAL | Status: DC | PRN

## 2012-07-20 MED ORDER — ENOXAPARIN SODIUM 40 MG/0.4ML ~~LOC~~ SOLN
40.0000 mg | SUBCUTANEOUS | Status: DC
  Administered 2012-07-20 – 2012-07-22 (×4): 40 mg via SUBCUTANEOUS
  Filled 2012-07-20 (×3): qty 0.4

## 2012-07-20 MED ORDER — METHYLPREDNISOLONE SODIUM SUCC 125 MG IJ SOLR
125.0000 mg | Freq: Once | INTRAMUSCULAR | Status: AC
  Administered 2012-07-20: 125 mg via INTRAVENOUS
  Filled 2012-07-20: qty 2

## 2012-07-20 NOTE — ED Notes (Signed)
Pt c/o SOB and generalized swelling for the past 3 days.  Also reports tightness in chest.  Rates at 5.   Pt on home o2 at 2.5liters.

## 2012-07-20 NOTE — H&P (Signed)
[facility]                                                                                    Patient Demographics  [name], is a 57 y.o. male  CSN: [id number]  MRN: [id number]  DOB - 05/01/1955  Admit Date - 07/20/2012  Outpatient Primary MD for the patient is Fredirick Maudlin, MD   With History of -  Past Medical History  Diagnosis Date  . Hypertension   . Dyspnea   . Chronic kidney disease, stage 2, mildly decreased GFR     Creatinine of 1.31 in 04/2011  . COPD (chronic obstructive pulmonary disease)     moderate by spirometry in 08/2009;FEV1 of 1.1 ;nl alpha -1 antitrypsin   . Obesity   . Psoriasis   . Left eye trauma     with loss of vision  . Erectile dysfunction   . Lumbosacral spinal stenosis     chronic low back pain  . Headache   . Sleep apnea     O2 at night  . Carotid artery aneurysm       Past Surgical History  Procedure Laterality Date  . Orif patella  12/14/2005    left fracture Dr.Harrison   . Ophthalmologic surgery  1963    secondary to left eye trauma, as a child hit in eye with tree limb   . Colonoscopy N/A 05/06/2012    Procedure: COLONOSCOPY;  Surgeon: Corbin Ade, MD;  Location: [facility];  Service: Endoscopy;  Laterality: N/A;  1:15    in for   Chief Complaint  Patient presents with  . Shortness of Breath     HPI  [name]  is a 57 y.o. male, with history of chronic diastolic CHF, COPD on home oxygen who recently quit smoking, presents to the hospital with few weeks history of gradually progressive edema starting from his lower extremities up to his belly, shortness of breath with orthopnea, and mild chest pressure, he also has a mild productive cough, denies any fever chills, denies any exposure to sick contacts, no abdominal pain no diarrhea. Presented to the ER where his workup was suggestive of acute on chronic respiratory failure do to CHF, mild COPD exacerbation and I was called to admit the  patient    Review of Systems    In addition to the HPI above,  No Fever-chills, No Headache, No changes with Vision or hearing, No problems swallowing food or Liquids, No Chest pain, ++ Cough, orthopnea & Shortness of Breath, some chest pressure No Abdominal pain, No Nausea or Vommitting, Bowel movements are regular, No Blood in stool or Urine, No dysuria, No new skin rashes or bruises, No new joints pains-aches,  No new weakness, tingling, numbness in any extremity, No recent weight gain or loss, No polyuria, polydypsia or polyphagia, No significant Mental Stressors.  A full 10 point Review of Systems was done, except as stated above, all other Review of Systems were negative.   Social History History  Substance Use Topics  . Smoking status: Former Smoker -- 1.00 packs/day for 20 years    Quit date: 06/10/2010  . Smokeless tobacco:  Never Used     Comment: Quit in 06/2010  . Alcohol Use: 0.5 oz/week    1 drink(s) per week     Comment: hx of ETOH abuse, none since hospitalization in December 2013      Family History Family History  Problem Relation Age of Onset  . Heart disease    . Arthritis    . Lung disease    . Cancer    . Asthma    . Colon cancer Brother     deceased at 15   . Brain cancer Brother      Prior to Admission medications   Medication Sig Start Date End Date Taking? Authorizing Provider  ADVAIR DISKUS 250-50 MCG/DOSE AEPB 1 puff 2 (two) times daily.  04/17/12  Yes Historical Provider, MD  albuterol (PROVENTIL HFA;VENTOLIN HFA) 108 (90 BASE) MCG/ACT inhaler Inhale 1 puff into the lungs as needed for wheezing (rescue inhaler).   Yes Historical Provider, MD  cloNIDine (CATAPRES) 0.2 MG tablet Take 0.2 mg by mouth 3 (three) times daily.   Yes Historical Provider, MD  HYDROcodone-acetaminophen (NORCO/VICODIN) 5-325 MG per tablet Take 1 tablet by mouth every 6 (six) hours as needed. 05/14/12  Yes Vickki Hearing, MD  lisinopril (PRINIVIL,ZESTRIL) 40 MG  tablet Take 40 mg by mouth daily.  04/06/12  Yes Historical Provider, MD  methotrexate (RHEUMATREX) 2.5 MG tablet Take 10 mg by mouth once a week. Caution:Chemotherapy. Protect from light.   Yes Historical Provider, MD  predniSONE (DELTASONE) 10 MG tablet Take 10 mg by mouth daily.  04/06/12  Yes Historical Provider, MD  ranitidine (ZANTAC) 300 MG tablet Take 300 mg by mouth at bedtime.   Yes Historical Provider, MD  Vitamin D, Ergocalciferol, (DRISDOL) 50000 UNITS CAPS Take 50,000 Units by mouth 2 (two) times a week.  04/06/12  Yes Historical Provider, MD    Allergies  Allergen Reactions  . Bee Venom Anaphylaxis    Patient has an epi pen    Physical Exam  Vitals  Blood pressure 135/83, pulse 103, temperature 98.4 F (36.9 C), temperature source Oral, resp. rate 24, SpO2 99.00%.   1. General middle aged AA male lying in bed in mild SOB,   2. Normal affect and insight, Not Suicidal or Homicidal, Awake Alert, Oriented X 3.  3. No F.N deficits, ALL C.Nerves Intact, Strength 5/5 all 4 extremities, Sensation intact all 4 extremities, Plantars down going.  4. Ears and Eyes appear Normal, Conjunctivae clear, PERRLA. Moist Oral Mucosa.  5. Supple Neck, + JVD, No cervical lymphadenopathy appriciated, No Carotid Bruits.  6. Symmetrical Chest wall movement, Mod air movement bilaterally, bilat rales, few wheezes  7. RRR, No Gallops, Rubs or Murmurs, No Parasternal Heave.  8. Positive Bowel Sounds, Abdomen Soft, Non tender, No organomegaly appriciated,No rebound -guarding or rigidity.  9.  No Cyanosis, Normal Skin Turgor, No Skin Rash or Bruise. 2+ edema upto abdomen  10. Good muscle tone,  joints appear normal , no effusions, Normal ROM.  11. No Palpable Lymph Nodes in Neck or Axillae    Data Review  CBC  Recent Labs Lab 07/20/12 0936  WBC 5.9  HGB 11.8*  HCT 35.4*  PLT 255  MCV 92.4  MCH 30.8  MCHC 33.3  RDW 12.6  LYMPHSABS 0.6*  MONOABS 0.3  EOSABS 0.1  BASOSABS 0.0    ------------------------------------------------------------------------------------------------------------------  Chemistries   Recent Labs Lab 07/20/12 0936  NA 140  K 3.8  CL 101  CO2 29  GLUCOSE 112*  BUN 13  CREATININE 1.34  CALCIUM 9.0   ------------------------------------------------------------------------------------------------------------------ CrCl is unknown because both a height and weight (above a minimum accepted value) are required for this calculation. ------------------------------------------------------------------------------------------------------------------ No results found for this basename: TSH, T4TOTAL, FREET3, T3FREE, THYROIDAB,  in the last 72 hours   Coagulation profile No results found for this basename: INR, PROTIME,  in the last 168 hours ------------------------------------------------------------------------------------------------------------------- No results found for this basename: DDIMER,  in the last 72 hours -------------------------------------------------------------------------------------------------------------------  Cardiac Enzymes  Recent Labs Lab 07/20/12 0936  TROPONINI <0.30   ------------------------------------------------------------------------------------------------------------------ No components found with this basename: POCBNP,    ---------------------------------------------------------------------------------------------------------------  Urinalysis    Component Value Date/Time   COLORURINE YELLOW 02/29/2012 2353   APPEARANCEUR CLEAR 02/29/2012 2353   LABSPEC 1.025 02/29/2012 2353   PHURINE 7.0 02/29/2012 2353   GLUCOSEU 500* 02/29/2012 2353   HGBUR LARGE* 02/29/2012 2353   BILIRUBINUR NEGATIVE 02/29/2012 2353   KETONESUR NEGATIVE 02/29/2012 2353   PROTEINUR >300* 02/29/2012 2353   UROBILINOGEN 0.2 02/29/2012 2353   NITRITE NEGATIVE 02/29/2012 2353   LEUKOCYTESUR NEGATIVE 02/29/2012 2353     ----------------------------------------------------------------------------------------------------------------  Imaging results:   Dg Chest Portable 1 View  07/20/2012  *RADIOLOGY REPORT*  Clinical Data: Shortness of breath  PORTABLE CHEST - 1 VIEW  Comparison: Portable exam 0944 hours compared to 03/06/2012  Findings: Enlargement of cardiac silhouette with pulmonary vascular congestion. Slight prominence of the right paratracheal soft tissues unchanged. Tortuous thoracic aorta. Emphysematous and minimal bronchitic changes consistent with COPD. Bibasilar atelectasis greater on right. Mild interstitial changes in the mid lower lungs could be related to atelectasis though subtle edema and infection not completely excluded. Bones demineralized. No definite pleural effusion or pneumothorax.  IMPRESSION: COPD changes with bibasilar atelectasis and basilar interstitial prominence, cannot completely exclude minimal edema or infection. Enlargement of cardiac silhouette with pulmonary vascular congestion.   Original Report Authenticated By: Ulyses Southward, M.D.     My personal review of EKG: Rhythm NSR, chronic lateral ST changes    Assessment & Plan  1. SOB due to Diastolic CHF, acute on chronic with Mild COPD exacerbation - he will be admitted on a telemetry bed, placed on fluid and salt restriction, IV Lasix today, nitro paste, Coreg will be added to his blood pressure medications, recent echo reviewed with 60% and diastolic dysfunction, low dose IV steroids along with Levaquin, cardiology input and cycle troponins.   2.Mild COPD exacerbation - he has mild productive cough chest x-ray does not show any infiltrate, no leukocytosis or fever, could have mild bronchitis exacerbating his COPD, will obtain sputum culture, Levaquin, low-dose IV steroids, nebulizer treatment and oxygen as needed. Patient is on home oxygen. Note he quit smoking about 6 months ago was re\re counseled to continue  abstaining.   3. Proteinuria with CKD -2 - on ACE, outpt PCP monitor.    4. History of psoriasis. No acute issues, home medications will be continued unchanged.       DVT Prophylaxis Lovenox   AM Labs Ordered, also please review Full Orders  Family Communication: Admission, patients condition and plan of care including tests being ordered have been discussed with the patient and family who indicate understanding and agree with the plan and Code Status.  Code Status full  Likely DC to home  Time spent in minutes : 35  Condition fair  Leroy Sea M.D on 07/20/2012 at 12:08 PM  Between 7am to 7pm - Pager - [phone]  After 7pm go to [email or web address] - password  [email or web address]  And look for the night coverage person covering me after hours  [facility] Office  [phone]

## 2012-07-20 NOTE — ED Provider Notes (Addendum)
History    This chart was scribed for Donnetta Hutching, MD, MD by Smitty Pluck, ED Scribe. The patient was seen in room APA14/[facility]14 and the patient's care was started at 10:07 AM.   CSN: [id number]  Arrival date & time 07/20/12  4098      Chief Complaint  Patient presents with  . Shortness of Breath    (Consider location/radiation/quality/duration/timing/severity/associated sxs/prior treatment) The history is provided by the patient, the spouse and medical records. No language interpreter was used.   HPI Comments: [name] is a 57 y.o. male who presents to the Emergency Department complaining of constant, severe SOB with onset of 3 days ago. Pt has history of COPD, obesity, and hypertension. PT states he is currently  taking CPAP, nebulizer 4 times a day, prednisone, Advair without relief. Pt reports chest tightness and rates the pain as a level 5/10. Pt states decrease in urination. He is a former smoker (stop 4 months ago). Pt denies fever, chills, nausea, vomiting, diarrhea, weakness, cough, and any other pain.   PCP Dr. Juanetta Gosling Past Medical History  Diagnosis Date  . Hypertension   . Dyspnea   . Chronic kidney disease, stage 2, mildly decreased GFR     Creatinine of 1.31 in 04/2011  . COPD (chronic obstructive pulmonary disease)     moderate by spirometry in 08/2009;FEV1 of 1.1 ;nl alpha -1 antitrypsin   . Obesity   . Psoriasis   . Left eye trauma     with loss of vision  . Erectile dysfunction   . Lumbosacral spinal stenosis     chronic low back pain  . Headache   . Sleep apnea     O2 at night  . Carotid artery aneurysm     Past Surgical History  Procedure Laterality Date  . Orif patella  12/14/2005    left fracture Dr.Harrison   . Ophthalmologic surgery  1963    secondary to left eye trauma, as a child hit in eye with tree limb   . Colonoscopy N/A 05/06/2012    Procedure: COLONOSCOPY;  Surgeon: Corbin Ade, MD;  Location: [facility];  Service: Endoscopy;   Laterality: N/A;  1:15    Family History  Problem Relation Age of Onset  . Heart disease    . Arthritis    . Lung disease    . Cancer    . Asthma    . Colon cancer Brother     deceased at 47   . Brain cancer Brother     History  Substance Use Topics  . Smoking status: Former Smoker -- 1.00 packs/day for 20 years    Quit date: 06/10/2010  . Smokeless tobacco: Never Used     Comment: Quit in 06/2010  . Alcohol Use: 0.5 oz/week    1 drink(s) per week     Comment: hx of ETOH abuse, none since hospitalization in December 2013       Review of Systems A complete 10 system review of systems was obtained and all systems are negative except as noted in the HPI and PMH.    Allergies  Bee venom  Home Medications   Current Outpatient Rx  Name  Route  Sig  Dispense  Refill  . ADVAIR DISKUS 250-50 MCG/DOSE AEPB      1 puff 2 (two) times daily.          Marland Kitchen albuterol (PROVENTIL HFA;VENTOLIN HFA) 108 (90 BASE) MCG/ACT inhaler   Inhalation  Inhale 1 puff into the lungs as needed for wheezing (rescue inhaler).         . cloNIDine (CATAPRES) 0.2 MG tablet   Oral   Take 0.2 mg by mouth 3 (three) times daily.         Marland Kitchen HYDROcodone-acetaminophen (NORCO/VICODIN) 5-325 MG per tablet   Oral   Take 1 tablet by mouth every 6 (six) hours as needed.   60 tablet   5   . lisinopril (PRINIVIL,ZESTRIL) 40 MG tablet   Oral   Take 40 mg by mouth daily.          . methotrexate (RHEUMATREX) 2.5 MG tablet   Oral   Take 10 mg by mouth once a week. Caution:Chemotherapy. Protect from light.         . predniSONE (DELTASONE) 10 MG tablet   Oral   Take 10 mg by mouth daily.          . ranitidine (ZANTAC) 300 MG tablet   Oral   Take 300 mg by mouth at bedtime.         . Vitamin D, Ergocalciferol, (DRISDOL) 50000 UNITS CAPS   Oral   Take 50,000 Units by mouth 2 (two) times a week.            Triage Vitals: SpO2 93%  Physical Exam  Nursing note and vitals  reviewed. Constitutional: He is oriented to person, place, and time. He appears well-developed and well-nourished.  HENT:  Head: Normocephalic and atraumatic.  Eyes: Conjunctivae and EOM are normal. Pupils are equal, round, and reactive to light.  Neck: Normal range of motion. Neck supple.  Cardiovascular: Normal rate, regular rhythm and normal heart sounds.   Pulmonary/Chest: Effort normal and breath sounds normal.  Abdominal: Soft. Bowel sounds are normal.  Musculoskeletal: Normal range of motion. He exhibits edema.  +3 edema  Neurological: He is alert and oriented to person, place, and time.  Skin: Skin is warm and dry.  Psychiatric: He has a normal mood and affect.    ED Course  Procedures (including critical care time) DIAGNOSTIC STUDIES: Oxygen Saturation is 93% on [place], low by my interpretation.    COORDINATION OF CARE:  10:15AM.  Discussed ED treatment with pt and pt agrees to taking diuretics, and breathing treatment.  Medications  albuterol (PROVENTIL) (5 MG/ML) 0.5% nebulizer solution 5 mg (5 mg Nebulization Given 07/20/12 1049)  ipratropium (ATROVENT) nebulizer solution 0.5 mg (0.5 mg Nebulization Given 07/20/12 1049)  methylPREDNISolone sodium succinate (SOLU-MEDROL) 125 mg/2 mL injection 125 mg (125 mg Intravenous Given 07/20/12 1046)  furosemide (LASIX) injection 80 mg (80 mg Intravenous Given 07/20/12 1047)      Labs Reviewed  CBC WITH DIFFERENTIAL - Abnormal; Notable for the following:    RBC 3.83 (*)    Hemoglobin 11.8 (*)    HCT 35.4 (*)    Neutrophils Relative 84 (*)    Lymphocytes Relative 10 (*)    Lymphs Abs 0.6 (*)    All other components within normal limits  BASIC METABOLIC PANEL - Abnormal; Notable for the following:    Glucose, Bld 112 (*)    GFR calc non Af Amer 57 (*)    GFR calc Af Amer 66 (*)    All other components within normal limits  PRO B NATRIURETIC PEPTIDE - Abnormal; Notable for the following:    Pro B Natriuretic peptide (BNP) 3060.0  (*)    All other components within normal limits  TROPONIN I   Dg  Chest Portable 1 View  07/20/2012  *RADIOLOGY REPORT*  Clinical Data: Shortness of breath  PORTABLE CHEST - 1 VIEW  Comparison: Portable exam 0944 hours compared to 03/06/2012  Findings: Enlargement of cardiac silhouette with pulmonary vascular congestion. Slight prominence of the right paratracheal soft tissues unchanged. Tortuous thoracic aorta. Emphysematous and minimal bronchitic changes consistent with COPD. Bibasilar atelectasis greater on right. Mild interstitial changes in the mid lower lungs could be related to atelectasis though subtle edema and infection not completely excluded. Bones demineralized. No definite pleural effusion or pneumothorax.  IMPRESSION: COPD changes with bibasilar atelectasis and basilar interstitial prominence, cannot completely exclude minimal edema or infection. Enlargement of cardiac silhouette with pulmonary vascular congestion.   Original Report Authenticated By: Ulyses Southward, M.D.    CRITICAL CARE Performed by: Donnetta Hutching Total critical care time: 30 Critical care time was exclusive of separately billable procedures and treating other patients. Critical care was necessary to treat or prevent imminent or life-threatening deterioration. Critical care was time spent personally by me on the following activities: development of treatment plan with patient and/or surrogate as well as nursing, discussions with consultants, evaluation of patient's response to treatment, examination of patient, obtaining history from patient or surrogate, ordering and performing treatments and interventions, ordering and review of laboratory studies, ordering and review of radiographic studies, pulse oximetry and re-evaluation of patient's condition.  No diagnosis found.    MDM  Patient has noticeable dyspnea and tachypnea exam.   My clinical suspicion is a combination of COPD exacerbation and mild heart failure.  Will Rx  albuterol/Atrovent nebulizer, IV Solu-Medrol, IV Lasix.   Admit to hospitalist      I personally performed the services described in this documentation, which was scribed in my presence. The recorded information has been reviewed and is accurate.    Donnetta Hutching, MD 07/20/12 1155  Donnetta Hutching, MD 07/20/12 [phone]

## 2012-07-20 NOTE — Progress Notes (Signed)
Pt with c/o left side epigastric pain.  Denies chest pain or pressure Pt describes pain as severe knot  Dr Thedore Mins notified by test orders received and followed through

## 2012-07-21 LAB — CBC
HCT: 35.7 % — ABNORMAL LOW (ref 39.0–52.0)
MCHC: 33.1 g/dL (ref 30.0–36.0)
MCV: 91.5 fL (ref 78.0–100.0)
RDW: 13 % (ref 11.5–15.5)

## 2012-07-21 LAB — BASIC METABOLIC PANEL
BUN: 23 mg/dL (ref 6–23)
CO2: 32 mEq/L (ref 19–32)
Chloride: 96 mEq/L (ref 96–112)
Creatinine, Ser: 1.73 mg/dL — ABNORMAL HIGH (ref 0.50–1.35)
Glucose, Bld: 141 mg/dL — ABNORMAL HIGH (ref 70–99)

## 2012-07-21 LAB — TROPONIN I: Troponin I: <0.3 ng/mL (ref <0.30)

## 2012-07-21 MED ORDER — FUROSEMIDE 10 MG/ML IJ SOLN
40.0000 mg | Freq: Two times a day (BID) | INTRAMUSCULAR | Status: DC
  Administered 2012-07-21 (×2): 40 mg via INTRAVENOUS
  Filled 2012-07-21 (×3): qty 4

## 2012-07-21 NOTE — Care Management Note (Signed)
    Page 1 of 1   07/23/2012     4:13:16 PM   CARE MANAGEMENT NOTE 07/23/2012  Patient:  CONSTANT, MANDEVILLE   Account Number:  [id number]  Date Initiated:  07/21/2012  Documentation initiated by:  Rosemary Holms  Subjective/Objective Assessment:   Pt admitted from home where he lives with his significant other. Pt has COPD and CHF. On home O2. Currently has [facility] RN who oversees [facility] from [place] who come 7 days a week for 2 hr. CM recommended increasing RN visits following DC. Pt     Action/Plan:   requests a hover round. CM will forward this request along to Dr. Juanetta Gosling, PCP.   Anticipated DC Date:  07/24/2012   Anticipated DC Plan:  HOME W HOME HEALTH SERVICES      DC Planning Services  CM consult      Choice offered to / List presented to:          [facility] arranged  HH-1 RN  HH-2 PT      [facility] agency  [facility]   Status of service:  Completed, signed off Medicare Important Message given?  YES (If response is "NO", the following Medicare IM given date fields will be blank) Date Medicare IM given:  07/23/2012 Date Additional Medicare IM given:    Discharge Disposition:  HOME W HOME HEALTH SERVICES  Per UR Regulation:    If discussed at Long Length of Stay Meetings, dates discussed:    Comments:  07/21/12 1330 Akeela Busk Leanord Hawking RN BSN CM

## 2012-07-21 NOTE — Progress Notes (Addendum)
INITIAL NUTRITION ASSESSMENT  DOCUMENTATION CODES Per approved criteria  -Obesity Class II   INTERVENTION: Low Sodium diet education provided  NUTRITION DIAGNOSIS: Unintended weight gain related to acute on chronic diastolic CHF as evidenced by shortness of breath, edema 2+ (H&P), 104% UBW .   Goal: Pt to meet >/= 90% of their estimated nutrition needs  Monitor:  Po intake, labs and wt trends  Reason for Assessment: Malnutrition Screen=2  57 y.o. male  Admitting Dx: Diastolic CHF, acute on chronic  ASSESSMENT: Pt appetite fair prior to acute episode. Has nurse who helps with daily care and food preparation. He's unable to prepare his own meals due to shortness of breath. Early satiety recently; likely related to acute increased fluid status. He is at risk for malnutrition due to presence of multiple chronic diseases in addition to  acute illness.  Low sodium diet education reviewed. Handout provided. Discouraged intake of processed foods and use of salt shaker. Encouraged fresh fruits, vegetables  and emphasized the role of fluids, foods to avoid, and importance of weighing self daily.   Height: Ht Readings from Last 1 Encounters:  07/20/12 5\' 9"  (1.753 m)    Weight: Wt Readings from Last 1 Encounters:  07/21/12 238 lb 15.7 oz (108.4 kg)    Ideal Body Weight: 160# (72.7 kg)  % Ideal Body Weight: 149%  Wt Readings from Last 10 Encounters:  07/21/12 238 lb 15.7 oz (108.4 kg)  04/30/12 232 lb 12.8 oz (105.597 kg)  03/12/12 229 lb 15 oz (104.3 kg)  12/06/11 240 lb (108.863 kg)  11/20/11 227 lb (102.967 kg)  09/25/11 227 lb (102.967 kg)  06/26/11 223 lb (101.152 kg)  05/16/11 223 lb 8.7 oz (101.4 kg)  03/26/11 220 lb (99.791 kg)  03/16/10 214 lb (97.07 kg)    Usual Body Weight: 230# (104.5 kg)  % Usual Body Weight: 104%  BMI:  Body mass index is 35.27 kg/(m^2). Obesity Class II  Estimated Nutritional Needs: Kcal: 1900-2200 Protein: 90-110 gr Fluid: per MD  goals  Skin: No issues noted  Diet Order: Cardiac  EDUCATION NEEDS: -Education needs addressed   Intake/Output Summary (Last 24 hours) at 07/21/12 1452 Last data filed at 07/21/12 0227  Gross per 24 hour  Intake    480 ml  Output   1750 ml  Net  -1270 ml    Last BM: 07/20/12  Labs:   Recent Labs Lab 07/20/12 0936 07/21/12 0235  NA 140 138  K 3.8 4.0  CL 101 96  CO2 29 32  BUN 13 23  CREATININE 1.34 1.73*  CALCIUM 9.0 8.8  GLUCOSE 112* 141*    CBG (last 3)  No results found for this basename: GLUCAP,  in the last 72 hours  Scheduled Meds: . aspirin EC  81 mg Oral Daily  . carvedilol  6.25 mg Oral BID WC  . cloNIDine  0.2 mg Oral TID  . docusate sodium  200 mg Oral BID  . enoxaparin (LOVENOX) injection  40 mg Subcutaneous Q24H  . furosemide  40 mg Intravenous Q12H  . levofloxacin  750 mg Oral Daily  . lisinopril  40 mg Oral Daily  . [START ON 07/27/2012] methotrexate  10 mg Oral Weekly  . methylPREDNISolone (SOLU-MEDROL) injection  60 mg Intravenous Q8H  . mometasone-formoterol  2 puff Inhalation BID  . nitroGLYCERIN  1 inch Topical Q6H  . pantoprazole  40 mg Oral BID  . sodium chloride  3 mL Intravenous Q12H  . Vitamin D (  Ergocalciferol)  50,000 Units Oral 2 times weekly    Continuous Infusions:   Past Medical History  Diagnosis Date  . Hypertension   . Dyspnea   . Chronic kidney disease, stage 2, mildly decreased GFR     Creatinine of 1.31 in 04/2011  . COPD (chronic obstructive pulmonary disease)     moderate by spirometry in 08/2009;FEV1 of 1.1 ;nl alpha -1 antitrypsin   . Obesity   . Psoriasis   . Left eye trauma     with loss of vision  . Erectile dysfunction   . Lumbosacral spinal stenosis     chronic low back pain  . Headache   . Sleep apnea     O2 at night  . Carotid artery aneurysm     Past Surgical History  Procedure Laterality Date  . Orif patella  12/14/2005    left fracture Dr.Harrison   . Ophthalmologic surgery  1963     secondary to left eye trauma, as a child hit in eye with tree limb   . Colonoscopy N/A 05/06/2012    Procedure: COLONOSCOPY;  Surgeon: Corbin Ade, MD;  Location: [facility];  Service: Endoscopy;  Laterality: N/A;  1:15    Royann Shivers MS,RD,LDN,CSG Office: [phone] Pager: [phone]

## 2012-07-21 NOTE — Progress Notes (Signed)
Utilization Review Complete  

## 2012-07-21 NOTE — Progress Notes (Signed)
Subjective: He says he feels better. He is still having some shortness of breath. There was some confusion about his medications because I had written him a prescription for Lasix with refills about 2 months ago but he says that the pharmacy doesn't have it. At any rate he is doing better. He is still short of breath and still has some edema  Objective: Vital signs in last 24 hours: Temp:  [97.7 F (36.5 C)-98.4 F (36.9 C)] 97.7 F (36.5 C) (05/13 0414) Pulse Rate:  [95-114] 103 (05/13 0414) Resp:  [24-25] 24 (05/13 0414) BP: (110-141)/(58-89) 115/58 mmHg (05/13 0414) SpO2:  [93 %-99 %] 98 % (05/13 0803) Weight:  [108.4 kg (238 lb 15.7 oz)-109.8 kg (242 lb 1 oz)] 108.4 kg (238 lb 15.7 oz) (05/13 0500) Weight change:  Last BM Date: 07/20/12  Intake/Output from previous day: 05/12 0701 - 05/13 0700 In: 480 [P.O.:480] Out: 2250 [Urine:2250]  PHYSICAL EXAM General appearance: alert, cooperative and mild distress Resp: rhonchi bilaterally Cardio: regular rate and rhythm, S1, S2 normal, no murmur, click, rub or gallop GI: soft, non-tender; bowel sounds normal; no masses,  no organomegaly Extremities: He has venous stasis and 1+ edema  Lab Results:    Basic Metabolic Panel:  Recent Labs  40/98/11 0936 07/21/12 0235  NA 140 138  K 3.8 4.0  CL 101 96  CO2 29 32  GLUCOSE 112* 141*  BUN 13 23  CREATININE 1.34 1.73*  CALCIUM 9.0 8.8   Liver Function Tests: No results found for this basename: AST, ALT, ALKPHOS, BILITOT, PROT, ALBUMIN,  in the last 72 hours No results found for this basename: LIPASE, AMYLASE,  in the last 72 hours No results found for this basename: AMMONIA,  in the last 72 hours CBC:  Recent Labs  07/20/12 0936 07/21/12 0235  WBC 5.9 7.2  NEUTROABS 4.9  --   HGB 11.8* 11.8*  HCT 35.4* 35.7*  MCV 92.4 91.5  PLT 255 263   Cardiac Enzymes:  Recent Labs  07/20/12 1517 07/20/12 2104 07/21/12 0235  TROPONINI <0.30 <0.30 <0.30   BNP:  Recent  Labs  07/20/12 0936  PROBNP 3060.0*   D-Dimer: No results found for this basename: DDIMER,  in the last 72 hours CBG: No results found for this basename: GLUCAP,  in the last 72 hours Hemoglobin A1C: No results found for this basename: HGBA1C,  in the last 72 hours Fasting Lipid Panel: No results found for this basename: CHOL, HDL, LDLCALC, TRIG, CHOLHDL, LDLDIRECT,  in the last 72 hours Thyroid Function Tests: No results found for this basename: TSH, T4TOTAL, FREET4, T3FREE, THYROIDAB,  in the last 72 hours Anemia Panel: No results found for this basename: VITAMINB12, FOLATE, FERRITIN, TIBC, IRON, RETICCTPCT,  in the last 72 hours Coagulation: No results found for this basename: LABPROT, INR,  in the last 72 hours Urine Drug Screen: Drugs of Abuse     Component Value Date/Time   LABOPIA NONE DETECTED 03/01/2012 0019   COCAINSCRNUR POSITIVE* 03/01/2012 0019   LABBENZ NONE DETECTED 03/01/2012 0019   AMPHETMU NONE DETECTED 03/01/2012 0019   THCU NONE DETECTED 03/01/2012 0019   LABBARB NONE DETECTED 03/01/2012 0019    Alcohol Level: No results found for this basename: ETH,  in the last 72 hours Urinalysis: No results found for this basename: COLORURINE, APPERANCEUR, LABSPEC, PHURINE, GLUCOSEU, HGBUR, BILIRUBINUR, KETONESUR, PROTEINUR, UROBILINOGEN, NITRITE, LEUKOCYTESUR,  in the last 72 hours Misc. Labs:  ABGS No results found for this basename: PHART, PCO2,  PO2ART, TCO2, HCO3,  in the last 72 hours CULTURES Recent Results (from the past 240 hour(s))  CULTURE, RESPIRATORY (NON-EXPECTORATED)     Status: None   Collection Time    07/20/12  6:20 PM      Result Value Range Status   Specimen Description SPUTUM EXPECTORATED   Final   Special Requests NONE   Final   Gram Stain     Final   Value: FEW WBC PRESENT, PREDOMINANTLY PMN     RARE SQUAMOUS EPITHELIAL CELLS PRESENT     RARE GRAM POSITIVE COCCI     IN PAIRS   Culture PENDING   Incomplete   Report Status PENDING    Incomplete   Studies/Results: Dg Chest Portable 1 View  07/20/2012  *RADIOLOGY REPORT*  Clinical Data: Shortness of breath  PORTABLE CHEST - 1 VIEW  Comparison: Portable exam 0944 hours compared to 03/06/2012  Findings: Enlargement of cardiac silhouette with pulmonary vascular congestion. Slight prominence of the right paratracheal soft tissues unchanged. Tortuous thoracic aorta. Emphysematous and minimal bronchitic changes consistent with COPD. Bibasilar atelectasis greater on right. Mild interstitial changes in the mid lower lungs could be related to atelectasis though subtle edema and infection not completely excluded. Bones demineralized. No definite pleural effusion or pneumothorax.  IMPRESSION: COPD changes with bibasilar atelectasis and basilar interstitial prominence, cannot completely exclude minimal edema or infection. Enlargement of cardiac silhouette with pulmonary vascular congestion.   Original Report Authenticated By: Ulyses Southward, M.D.    Dg Abd Portable 1v  07/20/2012  *RADIOLOGY REPORT*  Clinical Data: The left upper quadrant pain  PORTABLE ABDOMEN - 1 VIEW  Comparison: None.  Findings: Supine abdomen shows mild gaseous colonic distention.  No gaseous small bowel dilatation to suggest small bowel obstruction. Gastric bubble is unremarkable. Telemetry leads overlie the chest.  IMPRESSION: Mild gaseous distention of colon could be compatible with ileus. No evidence for overt small bowel obstruction on this supine study.   Original Report Authenticated By: Kennith Center, M.D.     Medications:  Prior to Admission:  Prescriptions prior to admission  Medication Sig Dispense Refill  . ADVAIR DISKUS 250-50 MCG/DOSE AEPB 1 puff 2 (two) times daily.       Marland Kitchen albuterol (PROVENTIL HFA;VENTOLIN HFA) 108 (90 BASE) MCG/ACT inhaler Inhale 1 puff into the lungs as needed for wheezing (rescue inhaler).      . cloNIDine (CATAPRES) 0.2 MG tablet Take 0.2 mg by mouth 3 (three) times daily.      Marland Kitchen  HYDROcodone-acetaminophen (NORCO/VICODIN) 5-325 MG per tablet Take 1 tablet by mouth every 6 (six) hours as needed.  60 tablet  5  . lisinopril (PRINIVIL,ZESTRIL) 40 MG tablet Take 40 mg by mouth daily.       . methotrexate (RHEUMATREX) 2.5 MG tablet Take 10 mg by mouth once a week. Caution:Chemotherapy. Protect from light.      . predniSONE (DELTASONE) 10 MG tablet Take 10 mg by mouth daily.       . ranitidine (ZANTAC) 300 MG tablet Take 300 mg by mouth at bedtime.      . Vitamin D, Ergocalciferol, (DRISDOL) 50000 UNITS CAPS Take 50,000 Units by mouth 2 (two) times a week.        Scheduled: . aspirin EC  81 mg Oral Daily  . carvedilol  6.25 mg Oral BID WC  . cloNIDine  0.2 mg Oral TID  . docusate sodium  200 mg Oral BID  . enoxaparin (LOVENOX) injection  40 mg Subcutaneous Q24H  .  levofloxacin  750 mg Oral Daily  . lisinopril  40 mg Oral Daily  . [START ON 07/27/2012] methotrexate  10 mg Oral Weekly  . methylPREDNISolone (SOLU-MEDROL) injection  60 mg Intravenous Q8H  . mometasone-formoterol  2 puff Inhalation BID  . nitroGLYCERIN  1 inch Topical Q6H  . pantoprazole  40 mg Oral BID  . sodium chloride  3 mL Intravenous Q12H  . Vitamin D (Ergocalciferol)  50,000 Units Oral 2 times weekly   Continuous:  YQM:[id number], gi cocktail, guaiFENesin-dextromethorphan, HYDROcodone-acetaminophen, ondansetron (ZOFRAN) IV, ondansetron, polyethylene glycol  Assesment: He came in with acute on chronic diastolic congestive heart failure. He has severe COPD and has had a relatively recent episode of acute respiratory failure with ventilator support. He has chronic kidney disease. He has hypertension and that seems to be doing okay Principal Problem:   Diastolic CHF, acute on chronic Active Problems:   CHRONIC OBSTRUCTIVE PULMONARY DISEASE   PSORIASIS   Chronic kidney disease, stage 2, mildly decreased GFR   Respiratory failure, acute   Anemia    Plan: Continue with diuresis. Watch his renal  function carefully.    LOS: 1 day   Allah Reason L 07/21/2012, 8:45 AM

## 2012-07-22 LAB — BASIC METABOLIC PANEL
GFR calc non Af Amer: 36 mL/min — ABNORMAL LOW (ref >90)
Glucose, Bld: 133 mg/dL — ABNORMAL HIGH (ref 70–99)
Potassium: 4 mEq/L (ref 3.5–5.1)
Sodium: 138 mEq/L (ref 135–145)

## 2012-07-22 MED ORDER — FUROSEMIDE 10 MG/ML IJ SOLN
40.0000 mg | Freq: Every day | INTRAMUSCULAR | Status: DC
  Administered 2012-07-22 – 2012-07-23 (×2): 40 mg via INTRAVENOUS
  Filled 2012-07-22: qty 4

## 2012-07-22 NOTE — Progress Notes (Signed)
This morning Dr. Felecia Shelling was notified that patient had a 7 beat run of V tach. Patient was asymptomatic and BP was 93/55. Dr. Felecia Shelling was also asked if it was okay to put the Nitro patch 1.0inch on the patient this morning. He stated that would be fine. Will continue to monitor patient.

## 2012-07-23 LAB — BASIC METABOLIC PANEL
BUN: 40 mg/dL — ABNORMAL HIGH (ref 6–23)
Chloride: 96 mEq/L (ref 96–112)
GFR calc Af Amer: 44 mL/min — ABNORMAL LOW (ref >90)
Potassium: 4.1 mEq/L (ref 3.5–5.1)

## 2012-07-23 LAB — CULTURE, RESPIRATORY W GRAM STAIN

## 2012-07-23 MED ORDER — AMOXICILLIN-POT CLAVULANATE 875-125 MG PO TABS: 1.0000 | ORAL_TABLET | Freq: Two times a day (BID) | ORAL | Status: DC

## 2012-07-23 MED ORDER — METHYLPREDNISOLONE 4 MG PO KIT: PACK | ORAL | Status: DC

## 2012-07-23 MED ORDER — FUROSEMIDE 40 MG PO TABS: 40.0000 mg | ORAL_TABLET | Freq: Every day | ORAL | Status: DC

## 2012-07-23 NOTE — Progress Notes (Signed)
Patient discharged home with family.  Instructed by MD to follow up next week.  Instructed on new medications.  Also educated on home care for CHF.  Verbalizes understanding of low NA/heart healthy diet, daily weights, monitoring of edema.  Home O2 already delivered. IV removed- WNL.  Patient has no questions at this time.  STable to DC home

## 2012-07-23 NOTE — Progress Notes (Signed)
Pt had a 5 run beat of Vtach, after checking pt, he was asymptomatic. Will continue to monitor.  [place], Liz Pinho N, RN

## 2012-07-23 NOTE — Progress Notes (Signed)
Subjective: He feels well. He says he wants to go home. He had some trouble with his CPAP mask swallow see if we can get that adjusted  Objective: Vital signs in last 24 hours: Temp:  [97.4 F (36.3 C)-98 F (36.7 C)] 97.4 F (36.3 C) (05/15 0518) Pulse Rate:  [80-91] 88 (05/15 0737) Resp:  [14-20] 20 (05/15 0518) BP: (91-121)/(53-75) 110/64 mmHg (05/15 0737) SpO2:  [94 %-100 %] 99 % (05/15 0747) Weight:  [108.9 kg (240 lb 1.3 oz)] 108.9 kg (240 lb 1.3 oz) (05/15 0518) Weight change: 0.8 kg (1 lb 12.2 oz) Last BM Date: 07/23/12  Intake/Output from previous day: 05/14 0701 - 05/15 0700 In: 1323 [P.O.:1320; I.V.:3] Out: 1200 [Urine:1200]  PHYSICAL EXAM General appearance: alert, cooperative and no distress Resp: clear to auscultation bilaterally Cardio: regular rate and rhythm, S1, S2 normal, no murmur, click, rub or gallop GI: soft, non-tender; bowel sounds normal; no masses,  no organomegaly Extremities: extremities normal, atraumatic, no cyanosis or edema  Lab Results:    Basic Metabolic Panel:  Recent Labs  16/10/96 0617 07/23/12 0605  NA 138 139  K 4.0 4.1  CL 96 96  CO2 35* 37*  GLUCOSE 133* 128*  BUN 37* 40*  CREATININE 1.96* 1.89*  CALCIUM 8.7 8.6   Liver Function Tests: No results found for this basename: AST, ALT, ALKPHOS, BILITOT, PROT, ALBUMIN,  in the last 72 hours No results found for this basename: LIPASE, AMYLASE,  in the last 72 hours No results found for this basename: AMMONIA,  in the last 72 hours CBC:  Recent Labs  07/20/12 0936 07/21/12 0235  WBC 5.9 7.2  NEUTROABS 4.9  --   HGB 11.8* 11.8*  HCT 35.4* 35.7*  MCV 92.4 91.5  PLT 255 263   Cardiac Enzymes:  Recent Labs  07/20/12 1517 07/20/12 2104 07/21/12 0235  TROPONINI <0.30 <0.30 <0.30   BNP:  Recent Labs  07/20/12 0936  PROBNP 3060.0*   D-Dimer: No results found for this basename: DDIMER,  in the last 72 hours CBG: No results found for this basename: GLUCAP,  in  the last 72 hours Hemoglobin A1C: No results found for this basename: HGBA1C,  in the last 72 hours Fasting Lipid Panel: No results found for this basename: CHOL, HDL, LDLCALC, TRIG, CHOLHDL, LDLDIRECT,  in the last 72 hours Thyroid Function Tests: No results found for this basename: TSH, T4TOTAL, FREET4, T3FREE, THYROIDAB,  in the last 72 hours Anemia Panel: No results found for this basename: VITAMINB12, FOLATE, FERRITIN, TIBC, IRON, RETICCTPCT,  in the last 72 hours Coagulation: No results found for this basename: LABPROT, INR,  in the last 72 hours Urine Drug Screen: Drugs of Abuse     Component Value Date/Time   LABOPIA NONE DETECTED 03/01/2012 0019   COCAINSCRNUR POSITIVE* 03/01/2012 0019   LABBENZ NONE DETECTED 03/01/2012 0019   AMPHETMU NONE DETECTED 03/01/2012 0019   THCU NONE DETECTED 03/01/2012 0019   LABBARB NONE DETECTED 03/01/2012 0019    Alcohol Level: No results found for this basename: ETH,  in the last 72 hours Urinalysis: No results found for this basename: COLORURINE, APPERANCEUR, LABSPEC, PHURINE, GLUCOSEU, HGBUR, BILIRUBINUR, KETONESUR, PROTEINUR, UROBILINOGEN, NITRITE, LEUKOCYTESUR,  in the last 72 hours Misc. Labs:  ABGS No results found for this basename: PHART, PCO2, PO2ART, TCO2, HCO3,  in the last 72 hours CULTURES Recent Results (from the past 240 hour(s))  CULTURE, RESPIRATORY (NON-EXPECTORATED)     Status: None   Collection Time  07/20/12  6:20 PM      Result Value Range Status   Specimen Description SPUTUM EXPECTORATED   Final   Special Requests NONE   Final   Gram Stain     Final   Value: FEW WBC PRESENT, PREDOMINANTLY PMN     RARE SQUAMOUS EPITHELIAL CELLS PRESENT     RARE GRAM POSITIVE COCCI     IN PAIRS   Culture NORMAL OROPHARYNGEAL FLORA   Final   Report Status PENDING   Incomplete   Studies/Results: No results found.  Medications:  Scheduled: . aspirin EC  81 mg Oral Daily  . carvedilol  6.25 mg Oral BID WC  . cloNIDine   0.2 mg Oral TID  . docusate sodium  200 mg Oral BID  . enoxaparin (LOVENOX) injection  40 mg Subcutaneous Q24H  . furosemide  40 mg Intravenous Daily  . levofloxacin  750 mg Oral Daily  . lisinopril  40 mg Oral Daily  . [START ON 07/27/2012] methotrexate  10 mg Oral Weekly  . methylPREDNISolone (SOLU-MEDROL) injection  60 mg Intravenous Q8H  . mometasone-formoterol  2 puff Inhalation BID  . nitroGLYCERIN  1 inch Topical Q6H  . pantoprazole  40 mg Oral BID  . sodium chloride  3 mL Intravenous Q12H  . Vitamin D (Ergocalciferol)  50,000 Units Oral 2 times weekly   Continuous:  [id number], gi cocktail, guaiFENesin-dextromethorphan, HYDROcodone-acetaminophen, ondansetron (ZOFRAN) IV, ondansetron, polyethylene glycol  Assesment: He was admitted with acute on chronic diastolic congestive heart failure. He has COPD. He has some trouble with sleep apnea. He has chronic kidney disease probably related to hypertension. He has improved. Principal Problem:   Diastolic CHF, acute on chronic Active Problems:   CHRONIC OBSTRUCTIVE PULMONARY DISEASE   PSORIASIS   Chronic kidney disease, stage 2, mildly decreased GFR   Respiratory failure, acute   Anemia    Plan: Discharge home with home health services    LOS: 3 days   [name] L 07/23/2012, 8:42 AM

## 2012-07-24 NOTE — Discharge Summary (Signed)
Physician Discharge Summary  Patient ID: [name] MRN: [id number] DOB/AGE: 1955/09/26 57 y.o. Primary Care Physician:[name] Filippini L, MD Admit date: 07/20/2012 Discharge date: 07/24/2012    Discharge Diagnoses:   Principal Problem:   Diastolic CHF, acute on chronic Active Problems:   CHRONIC OBSTRUCTIVE PULMONARY DISEASE   PSORIASIS   Chronic kidney disease, stage 2, mildly decreased GFR   Respiratory failure, acute   Anemia     Medication List    TAKE these medications       ADVAIR DISKUS 250-50 MCG/DOSE Aepb  Generic drug:  Fluticasone-Salmeterol  1 puff 2 (two) times daily.     albuterol 108 (90 BASE) MCG/ACT inhaler  Commonly known as:  PROVENTIL HFA;VENTOLIN HFA  Inhale 1 puff into the lungs as needed for wheezing (rescue inhaler).     amoxicillin-clavulanate 875-125 MG per tablet  Commonly known as:  AUGMENTIN  Take 1 tablet by mouth 2 (two) times daily.     cloNIDine 0.2 MG tablet  Commonly known as:  CATAPRES  Take 0.2 mg by mouth 3 (three) times daily.     furosemide 40 MG tablet  Commonly known as:  LASIX  Take 1 tablet (40 mg total) by mouth daily.     HYDROcodone-acetaminophen 5-325 MG per tablet  Commonly known as:  NORCO/VICODIN  Take 1 tablet by mouth every 6 (six) hours as needed.     lisinopril 40 MG tablet  Commonly known as:  PRINIVIL,ZESTRIL  Take 40 mg by mouth daily.     methotrexate 2.5 MG tablet  Commonly known as:  RHEUMATREX  Take 10 mg by mouth once a week. Caution:Chemotherapy. Protect from light.     methylPREDNISolone 4 MG tablet  Commonly known as:  MEDROL (PAK)  follow package directions     predniSONE 10 MG tablet  Commonly known as:  DELTASONE  Take 10 mg by mouth daily.     ranitidine 300 MG tablet  Commonly known as:  ZANTAC  Take 300 mg by mouth at bedtime.     Vitamin D (Ergocalciferol) 50000 UNITS Caps  Commonly known as:  DRISDOL  Take 50,000 Units by mouth 2 (two) times a week.         Discharged Condition: Improved    Consults: None  Significant Diagnostic Studies: Dg Chest Portable 1 View  07/20/2012   *RADIOLOGY REPORT*  Clinical Data: Shortness of breath  PORTABLE CHEST - 1 VIEW  Comparison: Portable exam 0944 hours compared to 03/06/2012  Findings: Enlargement of cardiac silhouette with pulmonary vascular congestion. Slight prominence of the right paratracheal soft tissues unchanged. Tortuous thoracic aorta. Emphysematous and minimal bronchitic changes consistent with COPD. Bibasilar atelectasis greater on right. Mild interstitial changes in the mid lower lungs could be related to atelectasis though subtle edema and infection not completely excluded. Bones demineralized. No definite pleural effusion or pneumothorax.  IMPRESSION: COPD changes with bibasilar atelectasis and basilar interstitial prominence, cannot completely exclude minimal edema or infection. Enlargement of cardiac silhouette with pulmonary vascular congestion.   Original Report Authenticated By: [name], M.D.   Dg Abd Portable 1v  07/20/2012   *RADIOLOGY REPORT*  Clinical Data: The left upper quadrant pain  PORTABLE ABDOMEN - 1 VIEW  Comparison: None.  Findings: Supine abdomen shows mild gaseous colonic distention.  No gaseous small bowel dilatation to suggest small bowel obstruction. Gastric bubble is unremarkable. Telemetry leads overlie the chest.  IMPRESSION: Mild gaseous distention of colon could be compatible with ileus. No evidence for overt small  bowel obstruction on this supine study.   Original Report Authenticated By: [name], M.D.    Lab Results: Basic Metabolic Panel:  Recent Labs  16/10/96 0617 07/23/12 0605  NA 138 139  K 4.0 4.1  CL 96 96  CO2 35* 37*  GLUCOSE 133* 128*  BUN 37* 40*  CREATININE 1.96* 1.89*  CALCIUM 8.7 8.6   Liver Function Tests: No results found for this basename: AST, ALT, ALKPHOS, BILITOT, PROT, ALBUMIN,  in the last 72 hours   CBC: No results  found for this basename: WBC, NEUTROABS, HGB, HCT, MCV, PLT,  in the last 72 hours  Recent Results (from the past 240 hour(s))  CULTURE, RESPIRATORY (NON-EXPECTORATED)     Status: None   Collection Time    07/20/12  6:20 PM      Result Value Range Status   Specimen Description SPUTUM EXPECTORATED   Final   Special Requests NONE   Final   Gram Stain     Final   Value: FEW WBC PRESENT, PREDOMINANTLY PMN     RARE SQUAMOUS EPITHELIAL CELLS PRESENT     RARE GRAM POSITIVE COCCI     IN PAIRS   Culture NORMAL OROPHARYNGEAL FLORA   Final   Report Status 07/23/2012 FINAL   Final     Hospital Course: He came to the emergency because of increasing shortness of breath and edema. He was supposed to be taking a diuretic but there was some miscommunication between the patient and his drug store. He was started on IV diuresis given inhaled bronchodilators steroids etc. and improved markedly. By the time of discharge he was back at baseline. He was able to ambulate in the room. He had no more edema. His chest had clear.  Discharge Exam: Blood pressure 107/53, pulse 88, temperature 97.4 F (36.3 C), temperature source Oral, resp. rate 20, height 5\' 9"  (1.753 m), weight 108.9 kg (240 lb 1.3 oz), SpO2 99.00%. He is awake and alert. His chest is clear. His heart is regular. He has no edema.  Disposition: Home with home health services      Discharge Orders   Future Orders Complete By Expires     Face-to-face encounter (required for Medicare/Medicaid patients)  As directed     Comments:      I [name] certify that this patient is under my care and that I, or a nurse practitioner or physician's assistant working with me, had a face-to-face encounter that meets the physician face-to-face encounter requirements with this patient on 07/23/2012. The encounter with the patient was in whole, or in part for the following medical condition(s) which is the primary reason for home health care (List medical  condition): chf/copd    Questions:      The encounter with the patient was in whole, or in part, for the following medical condition, which is the primary reason for home health care:  chf/copd    I certify that, based on my findings, the following services are medically necessary home health services:  Nursing    Physical therapy    My clinical findings support the need for the above services:  Shortness of breath with activity    Further, I certify that my clinical findings support that this patient is homebound due to:  Shortness of Breath with activity    Reason for Medically Necessary Home Health Services:  Skilled Nursing- Change/Decline in Patient Status    Home Health  As directed     Questions:  To provide the following care/treatments:  PT    RN       Follow-up Information   Follow up with [facility]. [name] PT and RN)    Contact information:   [address] [place] [address] [phone]      Signed: Fredirick Maudlin Pager [phone]  07/24/2012, 8:26 AM

## 2012-08-10 ENCOUNTER — Other Ambulatory Visit: Payer: Self-pay | Admitting: *Deleted

## 2012-08-10 DIAGNOSIS — S82002D Unspecified fracture of left patella, subsequent encounter for closed fracture with routine healing: Secondary | ICD-10-CM

## 2012-08-10 MED ORDER — HYDROCODONE-ACETAMINOPHEN 5-325 MG PO TABS: 1.0000 | ORAL_TABLET | Freq: Four times a day (QID) | ORAL | Status: DC | PRN

## 2012-08-25 ENCOUNTER — Encounter: Payer: Self-pay | Admitting: Internal Medicine

## 2012-10-05 ENCOUNTER — Encounter (HOSPITAL_COMMUNITY): Payer: Self-pay

## 2012-10-05 ENCOUNTER — Inpatient Hospital Stay (HOSPITAL_COMMUNITY)
Admission: EM | Admit: 2012-10-05 | Discharge: 2012-10-07 | DRG: 189 | Disposition: A | Discharge status: Home / Self Care | Payer: Medicare HMO | Attending: Pulmonary Disease | Admitting: Pulmonary Disease

## 2012-10-05 ENCOUNTER — Emergency Department (HOSPITAL_COMMUNITY): Payer: Medicare HMO

## 2012-10-05 DIAGNOSIS — C189 Malignant neoplasm of colon, unspecified: Secondary | ICD-10-CM | POA: Diagnosis present

## 2012-10-05 DIAGNOSIS — H547 Unspecified visual loss: Secondary | ICD-10-CM | POA: Diagnosis present

## 2012-10-05 DIAGNOSIS — N182 Chronic kidney disease, stage 2 (mild): Secondary | ICD-10-CM | POA: Diagnosis present

## 2012-10-05 DIAGNOSIS — I472 Ventricular tachycardia, unspecified: Secondary | ICD-10-CM | POA: Diagnosis present

## 2012-10-05 DIAGNOSIS — Z6834 Body mass index (BMI) 34.0-34.9, adult: Secondary | ICD-10-CM

## 2012-10-05 DIAGNOSIS — G4733 Obstructive sleep apnea (adult) (pediatric): Secondary | ICD-10-CM | POA: Diagnosis present

## 2012-10-05 DIAGNOSIS — G4730 Sleep apnea, unspecified: Secondary | ICD-10-CM | POA: Diagnosis present

## 2012-10-05 DIAGNOSIS — Z79899 Other long term (current) drug therapy: Secondary | ICD-10-CM

## 2012-10-05 DIAGNOSIS — I5033 Acute on chronic diastolic (congestive) heart failure: Secondary | ICD-10-CM

## 2012-10-05 DIAGNOSIS — I5032 Chronic diastolic (congestive) heart failure: Secondary | ICD-10-CM | POA: Diagnosis present

## 2012-10-05 DIAGNOSIS — J449 Chronic obstructive pulmonary disease, unspecified: Secondary | ICD-10-CM

## 2012-10-05 DIAGNOSIS — L408 Other psoriasis: Secondary | ICD-10-CM | POA: Diagnosis present

## 2012-10-05 DIAGNOSIS — I129 Hypertensive chronic kidney disease with stage 1 through stage 4 chronic kidney disease, or unspecified chronic kidney disease: Secondary | ICD-10-CM | POA: Diagnosis present

## 2012-10-05 DIAGNOSIS — J441 Chronic obstructive pulmonary disease with (acute) exacerbation: Secondary | ICD-10-CM | POA: Diagnosis present

## 2012-10-05 DIAGNOSIS — Z9981 Dependence on supplemental oxygen: Secondary | ICD-10-CM

## 2012-10-05 DIAGNOSIS — J9620 Acute and chronic respiratory failure, unspecified whether with hypoxia or hypercapnia: Principal | ICD-10-CM | POA: Diagnosis present

## 2012-10-05 DIAGNOSIS — Z87891 Personal history of nicotine dependence: Secondary | ICD-10-CM

## 2012-10-05 DIAGNOSIS — IMO0002 Reserved for concepts with insufficient information to code with codable children: Secondary | ICD-10-CM

## 2012-10-05 DIAGNOSIS — I509 Heart failure, unspecified: Secondary | ICD-10-CM | POA: Diagnosis present

## 2012-10-05 DIAGNOSIS — R000 Tachycardia, unspecified: Secondary | ICD-10-CM | POA: Diagnosis present

## 2012-10-05 DIAGNOSIS — M48061 Spinal stenosis, lumbar region without neurogenic claudication: Secondary | ICD-10-CM | POA: Diagnosis present

## 2012-10-05 DIAGNOSIS — E669 Obesity, unspecified: Secondary | ICD-10-CM | POA: Diagnosis present

## 2012-10-05 DIAGNOSIS — I4729 Other ventricular tachycardia: Secondary | ICD-10-CM | POA: Diagnosis present

## 2012-10-05 LAB — BLOOD GAS, ARTERIAL
Bicarbonate: 30.9 mEq/L — ABNORMAL HIGH (ref 20.0–24.0)
O2 Content: 3 L/min
pCO2 arterial: 50 mmHg — ABNORMAL HIGH (ref 35.0–45.0)
pO2, Arterial: 123 mmHg — ABNORMAL HIGH (ref 80.0–100.0)

## 2012-10-05 LAB — COMPREHENSIVE METABOLIC PANEL
AST: 24 U/L (ref 0–37)
Albumin: 4 g/dL (ref 3.5–5.2)
BUN: 13 mg/dL (ref 6–23)
Chloride: 100 mEq/L (ref 96–112)
Creatinine, Ser: 1.2 mg/dL (ref 0.50–1.35)
Potassium: 4.4 mEq/L (ref 3.5–5.1)
Total Protein: 7.3 g/dL (ref 6.0–8.3)

## 2012-10-05 LAB — URINALYSIS, ROUTINE W REFLEX MICROSCOPIC
Hgb urine dipstick: NEGATIVE
Leukocytes, UA: NEGATIVE
Nitrite: NEGATIVE
Protein, ur: NEGATIVE mg/dL
Specific Gravity, Urine: 1.015 (ref 1.005–1.030)
Urobilinogen, UA: 0.2 mg/dL (ref 0.0–1.0)

## 2012-10-05 LAB — CBC WITH DIFFERENTIAL/PLATELET
Basophils Absolute: 0 10*3/uL (ref 0.0–0.1)
Basophils Relative: 1 % (ref 0–1)
Eosinophils Absolute: 0.1 10*3/uL (ref 0.0–0.7)
Hemoglobin: 12.8 g/dL — ABNORMAL LOW (ref 13.0–17.0)
MCH: 29.7 pg (ref 26.0–34.0)
MCHC: 32.7 g/dL (ref 30.0–36.0)
Monocytes Absolute: 0.4 10*3/uL (ref 0.1–1.0)
Monocytes Relative: 6 % (ref 3–12)
Neutro Abs: 4.2 10*3/uL (ref 1.7–7.7)
Neutrophils Relative %: 73 % (ref 43–77)
RDW: 13.2 % (ref 11.5–15.5)

## 2012-10-05 LAB — TROPONIN I: Troponin I: <0.3 ng/mL (ref <0.30)

## 2012-10-05 LAB — PRO B NATRIURETIC PEPTIDE: Pro B Natriuretic peptide (BNP): 768.6 pg/mL — ABNORMAL HIGH (ref 0–125)

## 2012-10-05 MED ORDER — IPRATROPIUM BROMIDE 0.02 % IN SOLN
0.5000 mg | RESPIRATORY_TRACT | Status: DC
  Administered 2012-10-05 – 2012-10-07 (×11): 0.5 mg via RESPIRATORY_TRACT
  Filled 2012-10-05 (×11): qty 2.5

## 2012-10-05 MED ORDER — HYDROMORPHONE HCL PF 1 MG/ML IJ SOLN
0.5000 mg | INTRAMUSCULAR | Status: DC | PRN
  Administered 2012-10-07: 0.5 mg via INTRAVENOUS
  Filled 2012-10-05: qty 1

## 2012-10-05 MED ORDER — HYDROCODONE-ACETAMINOPHEN 5-325 MG PO TABS: 1.0000 | ORAL_TABLET | Freq: Four times a day (QID) | ORAL | Status: DC | PRN

## 2012-10-05 MED ORDER — FUROSEMIDE 10 MG/ML IJ SOLN
40.0000 mg | Freq: Once | INTRAMUSCULAR | Status: AC
  Administered 2012-10-05: 40 mg via INTRAVENOUS
  Filled 2012-10-05: qty 4

## 2012-10-05 MED ORDER — ENOXAPARIN SODIUM 40 MG/0.4ML ~~LOC~~ SOLN
40.0000 mg | SUBCUTANEOUS | Status: DC
  Filled 2012-10-05 (×2): qty 0.4

## 2012-10-05 MED ORDER — ALBUTEROL (5 MG/ML) CONTINUOUS INHALATION SOLN
15.0000 mg/h | INHALATION_SOLUTION | Freq: Once | RESPIRATORY_TRACT | Status: AC
  Administered 2012-10-05: 15 mg/h via RESPIRATORY_TRACT
  Filled 2012-10-05: qty 20

## 2012-10-05 MED ORDER — FAMOTIDINE 20 MG PO TABS
10.0000 mg | ORAL_TABLET | Freq: Every day | ORAL | Status: DC
  Administered 2012-10-05 – 2012-10-07 (×3): 10 mg via ORAL
  Filled 2012-10-05 (×3): qty 1

## 2012-10-05 MED ORDER — TIOTROPIUM BROMIDE MONOHYDRATE 18 MCG IN CAPS
18.0000 ug | ORAL_CAPSULE | Freq: Every day | RESPIRATORY_TRACT | Status: DC
  Filled 2012-10-05: qty 5

## 2012-10-05 MED ORDER — PREDNISONE 50 MG PO TABS
60.0000 mg | ORAL_TABLET | Freq: Once | ORAL | Status: AC
  Administered 2012-10-05: 60 mg via ORAL

## 2012-10-05 MED ORDER — BISACODYL 10 MG RE SUPP: 10.0000 mg | Freq: Every day | RECTAL | Status: DC | PRN

## 2012-10-05 MED ORDER — PREDNISONE 50 MG PO TABS
60.0000 mg | ORAL_TABLET | Freq: Every day | ORAL | Status: DC
  Filled 2012-10-05: qty 1

## 2012-10-05 MED ORDER — IPRATROPIUM BROMIDE 0.02 % IN SOLN
0.5000 mg | Freq: Once | RESPIRATORY_TRACT | Status: AC
  Administered 2012-10-05: 0.5 mg via RESPIRATORY_TRACT
  Filled 2012-10-05: qty 2.5

## 2012-10-05 MED ORDER — TRAZODONE HCL 50 MG PO TABS: 25.0000 mg | ORAL_TABLET | Freq: Every evening | ORAL | Status: DC | PRN

## 2012-10-05 MED ORDER — ONDANSETRON HCL 4 MG/2ML IJ SOLN: 4.0000 mg | Freq: Four times a day (QID) | INTRAMUSCULAR | Status: DC | PRN

## 2012-10-05 MED ORDER — ALUM & MAG HYDROXIDE-SIMETH 200-200-20 MG/5ML PO SUSP
30.0000 mL | Freq: Four times a day (QID) | ORAL | Status: DC | PRN
  Administered 2012-10-05: 30 mL via ORAL
  Filled 2012-10-05: qty 30

## 2012-10-05 MED ORDER — LEVALBUTEROL HCL 0.63 MG/3ML IN NEBU
0.6300 mg | INHALATION_SOLUTION | RESPIRATORY_TRACT | Status: DC
  Administered 2012-10-05 – 2012-10-07 (×11): 0.63 mg via RESPIRATORY_TRACT
  Filled 2012-10-05 (×11): qty 3

## 2012-10-05 MED ORDER — SODIUM CHLORIDE 0.9 % IJ SOLN
3.0000 mL | Freq: Two times a day (BID) | INTRAMUSCULAR | Status: DC
  Administered 2012-10-05 – 2012-10-06 (×3): 3 mL via INTRAVENOUS

## 2012-10-05 MED ORDER — ACETAMINOPHEN 325 MG PO TABS: 650.0000 mg | ORAL_TABLET | Freq: Four times a day (QID) | ORAL | Status: DC | PRN

## 2012-10-05 MED ORDER — SENNA 8.6 MG PO TABS
1.0000 | ORAL_TABLET | Freq: Two times a day (BID) | ORAL | Status: DC
  Administered 2012-10-05 – 2012-10-07 (×4): 8.6 mg via ORAL
  Filled 2012-10-05 (×5): qty 1

## 2012-10-05 MED ORDER — ACETAMINOPHEN 650 MG RE SUPP: 650.0000 mg | Freq: Four times a day (QID) | RECTAL | Status: DC | PRN

## 2012-10-05 MED ORDER — LEVOFLOXACIN 500 MG PO TABS
500.0000 mg | ORAL_TABLET | Freq: Every day | ORAL | Status: DC
  Administered 2012-10-06 – 2012-10-07 (×2): 500 mg via ORAL
  Filled 2012-10-05 (×3): qty 1

## 2012-10-05 MED ORDER — CLOBETASOL PROPIONATE 0.05 % EX OINT
TOPICAL_OINTMENT | Freq: Two times a day (BID) | CUTANEOUS | Status: DC
  Administered 2012-10-05 – 2012-10-06 (×2): via TOPICAL
  Filled 2012-10-05 (×2): qty 15

## 2012-10-05 MED ORDER — METHYLPREDNISOLONE SODIUM SUCC 125 MG IJ SOLR
60.0000 mg | Freq: Four times a day (QID) | INTRAMUSCULAR | Status: DC
  Administered 2012-10-05 – 2012-10-07 (×7): 60 mg via INTRAVENOUS
  Filled 2012-10-05 (×7): qty 2

## 2012-10-05 MED ORDER — GUAIFENESIN ER 600 MG PO TB12
1200.0000 mg | ORAL_TABLET | Freq: Two times a day (BID) | ORAL | Status: DC
  Administered 2012-10-05 – 2012-10-07 (×4): 1200 mg via ORAL
  Filled 2012-10-05 (×2): qty 2
  Filled 2012-10-05: qty 1
  Filled 2012-10-05 (×2): qty 2

## 2012-10-05 MED ORDER — CLONIDINE HCL 0.2 MG PO TABS
0.2000 mg | ORAL_TABLET | Freq: Three times a day (TID) | ORAL | Status: DC
  Administered 2012-10-05 – 2012-10-07 (×6): 0.2 mg via ORAL
  Filled 2012-10-05 (×7): qty 1

## 2012-10-05 MED ORDER — ONDANSETRON HCL 4 MG PO TABS: 4.0000 mg | ORAL_TABLET | Freq: Four times a day (QID) | ORAL | Status: DC | PRN

## 2012-10-05 MED ORDER — LISINOPRIL 10 MG PO TABS
40.0000 mg | ORAL_TABLET | Freq: Every day | ORAL | Status: DC
  Administered 2012-10-05 – 2012-10-07 (×3): 40 mg via ORAL
  Filled 2012-10-05 (×2): qty 4
  Filled 2012-10-05: qty 1

## 2012-10-05 MED ORDER — FUROSEMIDE 40 MG PO TABS
40.0000 mg | ORAL_TABLET | Freq: Every day | ORAL | Status: DC
Start: 2012-10-05 — End: 2012-10-07
  Administered 2012-10-05 – 2012-10-07 (×3): 40 mg via ORAL
  Filled 2012-10-05 (×3): qty 1

## 2012-10-05 NOTE — Progress Notes (Signed)
Patient placed on cpap 6cmH20 with 2.5liters bled in. Patient tolerating well at this time. I let patient know if he comes off of cpap he needs to let someone know so we could put him back on 02. Hr-94 02-96% at this time

## 2012-10-05 NOTE — H&P (Signed)
[facility] History and Physical  [name] MWU:[id number] DOB: 1956/02/15 DOA: 10/05/2012  Referring physician:  PCP: Fredirick Maudlin, MD  Specialists:   Chief Complaint: dyspnea  HPI: [name] is a very pleasant 57 y.o. male with a past medical history that includes COPD, chronic diastolic heart failure, hypertension, sleep apnea, chronic kidney disease stage II presents to the emergency room with a chief complaint of shortness of breath. Information is obtained from the patient. He states over the last 3 days he has experienced gradual worsening of his shortness of breath. He states that his baseline is being able to walk around the block with minimal respiratory distress, he wears oxygen at home on an as-needed basis and a CPAP at night during sleep. He indicates over the last 3 days he's had a gradual worsening in his shortness of breath with exertion. Associated symptoms include nonproductive cough and right abdominal pain with coughing. He states he took Mucinex initially which helped briefly but he still having trouble coughing up the mucus. Yesterday he experienced shortness of breath at rest. 2 days ago he called his primary care provider and was seen and started on prednisone who and blood work was drawn. He denies any chest pain palpitation headache dizziness fever nausea vomiting. He denies any lower extremity edema. He does indicate that around a month ago he was diagnosed with heart failure and started on Lasix. He indicates he's been taking his medications as directed. Today he went to see his dermatologist as he also has a history of psoriasis that has been giving him quite a" fit". He reports that his dermotologist sent him to the emergency room today due to his obvious respiratory distress. In the emergency room an arterial blood gas yields a PC O2 of 50.0 a PCO2 of 123 bicarbonate 30.9. Other lab work included a proBNP of 768.6. Chest x-ray yields  hyperinflation suggestive of emphysema. No acute cardiopulmonary  Process.  EKG yields possible Left atrial enlargement septal infarct , age undetermined Abnormal ECG When compared with ECG of 20-Jul-2012 15:24, T wave inversion less evident in Anterolateral leads. Symptoms came on gradually have worsened characterized as moderate to severe. [facility] are asked to admit.  Review of Systems: The patient denies anorexia, fever, weight loss,, vision loss, decreased hearing, hoarseness, chest pain, syncope,  peripheral edema, balance deficits, hemoptysis, abdominal pain, melena, hematochezia, severe indigestion/heartburn, hematuria, incontinence, genital sores, muscle weakness, suspicious skin lesions, transient blindness, difficulty walking, depression, unusual weight change, abnormal bleeding, enlarged lymph nodes, angioedema, and breast masses.    Past Medical History  Diagnosis Date  . Hypertension   . Dyspnea   . Chronic kidney disease, stage 2, mildly decreased GFR     Creatinine of 1.31 in 04/2011  . COPD (chronic obstructive pulmonary disease)     moderate by spirometry in 08/2009;FEV1 of 1.1 ;nl alpha -1 antitrypsin   . Obesity   . Psoriasis   . Left eye trauma     with loss of vision  . Erectile dysfunction   . Lumbosacral spinal stenosis     chronic low back pain  . Headache(784.0)   . Sleep apnea     O2 at night  . Carotid artery aneurysm    Past Surgical History  Procedure Laterality Date  . Orif patella  12/14/2005    left fracture Dr.Harrison   . Ophthalmologic surgery  1963    secondary to left eye trauma, as a child hit in eye with tree  limb   . Colonoscopy N/A 05/06/2012    Procedure: COLONOSCOPY;  Surgeon: Corbin Ade, MD;  Location: [facility];  Service: Endoscopy;  Laterality: N/A;  1:15   Social History:  reports that he quit smoking about 2 years ago. He has never used smokeless tobacco. He reports that he drinks about 0.5 ounces of alcohol per week.  He reports that he does not use illicit drugs. He lives alone. He has a caregiver 2 hours a day to assist with his ADLs.   Allergies  Allergen Reactions  . Bee Venom Anaphylaxis    Patient has an epi pen    Family History  Problem Relation Age of Onset  . Heart disease    . Arthritis    . Lung disease    . Cancer    . Asthma    . Colon cancer Brother     deceased at 86   . Brain cancer Brother      Prior to Admission medications   Medication Sig Start Date End Date Taking? Authorizing Provider  ADVAIR DISKUS 250-50 MCG/DOSE AEPB Inhale 1 puff into the lungs 2 (two) times daily.  04/17/12  Yes Historical Provider, MD  albuterol (PROVENTIL HFA;VENTOLIN HFA) 108 (90 BASE) MCG/ACT inhaler Inhale 2 puffs into the lungs as needed for wheezing (rescue inhaler).    Yes Historical Provider, MD  cloNIDine (CATAPRES) 0.2 MG tablet Take 0.2 mg by mouth 3 (three) times daily.   Yes Historical Provider, MD  Fluticasone Furoate-Vilanterol (BREO ELLIPTA) 100-25 MCG/INH AEPB Inhale 1 puff into the lungs daily as needed (Uses when he doesn't have Advair Inhaler.).   Yes Historical Provider, MD  furosemide (LASIX) 40 MG tablet Take 1 tablet (40 mg total) by mouth daily. 07/23/12  Yes Fredirick Maudlin, MD  guaiFENesin (MUCINEX) 600 MG 12 hr tablet Take 1,200 mg by mouth 2 (two) times daily.   Yes Historical Provider, MD  HYDROcodone-acetaminophen (NORCO/VICODIN) 5-325 MG per tablet Take 1 tablet by mouth every 6 (six) hours as needed for pain. 08/10/12  Yes Vickki Hearing, MD  lisinopril (PRINIVIL,ZESTRIL) 40 MG tablet Take 40 mg by mouth daily.  04/06/12  Yes Historical Provider, MD  predniSONE (DELTASONE) 10 MG tablet Take 10 mg by mouth daily.  04/06/12  Yes Historical Provider, MD  ranitidine (ZANTAC) 300 MG tablet Take 300 mg by mouth at bedtime.   Yes Historical Provider, MD   Physical Exam: Filed Vitals:   10/05/12 1108 10/05/12 1150  BP: 130/64   Pulse: 101   Temp: 98.3 F (36.8 C)    TempSrc: Oral   Resp: 18   Height: 5\' 9"  (1.753 m)   Weight: 230 lb (104.327 kg)   SpO2: 93% 98%     General:  Alert obese moderate respiratory distress  Eyes: PE RRL, EOMI,  ENT: Ears clear nose without drainage oropharynx without erythema or exudate. Mucous membranes of his mouth slightly pale slightly dry  Neck: Supple no JVD full range of motion no lymphadenopathy  Cardiovascular: Tachycardic but regular no murmur gallop or rub no lower extremity edema  Respiratory: Moderate increased work of breathing with conversation. Use of abdominal accessory muscles. Slightly prolonged expiratory phase. Breath sounds with very diminished air movement bilaterally particularly at bases. No wheeze no rhonchi no crackles  Abdomen: Obese soft positive bowel sounds nontender to palpation no mass or organomegaly noted  Skin: Warm and dry evidence of rash/lesions consistent with psoriasis particularly on his back axilla area lower  extremities  Musculoskeletal: No clubbing no cyanosis moves all extremities  Psychiatric: Calm cooperative  Neurologic: Speech clear facial symmetry cranial nerves II through XII grossly  Labs on Admission:  Basic Metabolic Panel:  Recent Labs Lab 10/05/12 1141  NA 140  K 4.4  CL 100  CO2 31  GLUCOSE 106*  BUN 13  CREATININE 1.20  CALCIUM 9.5   Liver Function Tests:  Recent Labs Lab 10/05/12 1141  AST 24  ALT 28  ALKPHOS 62  BILITOT 0.6  PROT 7.3  ALBUMIN 4.0   No results found for this basename: LIPASE, AMYLASE,  in the last 168 hours No results found for this basename: AMMONIA,  in the last 168 hours CBC:  Recent Labs Lab 10/05/12 1141  WBC 5.7  NEUTROABS 4.2  HGB 12.8*  HCT 39.2  MCV 91.0  PLT 246   Cardiac Enzymes:  Recent Labs Lab 10/05/12 1141  TROPONINI <0.30    BNP (last 3 results)  Recent Labs  02/29/12 2347 07/20/12 0936 10/05/12 1141  PROBNP 1408.0* 3060.0* 768.6*   CBG: No results found for this  basename: GLUCAP,  in the last 168 hours  Radiological Exams on Admission: Dg Chest 2 View  10/05/2012   *RADIOLOGY REPORT*  Clinical Data: Shortness of breath  CHEST - 2 VIEW  Comparison: 07/20/2012  Findings: Aorta is ectatic and unfolded.  Heart size upper limits of normal.  The lungs are clear but hyper aerated which suggests emphysema.  No pleural effusion.  No acute osseous finding.  No pneumothorax.  IMPRESSION: Hyperinflation suggestive of emphysema.  No acute cardiopulmonary process.   Original Report Authenticated By: Christiana Pellant, M.D.    EKG: Independently reviewed. Normal sinus rhythm Possible Left atrial enlargement Septal infarct , age undetermined Abnormal ECG When compared with ECG of 20-Jul-2012 15:24, T wave inversion less evident in Anterolateral leads  Assessment/Plan Principal Problem:   Acute-on-chronic respiratory failure: Related to COPD exacerbation. Will admit to telemetry. Will provide nebulizer every 4 hours. Will start Solu-Medrol every 6 hours and Levaquin. Will obtain sputum culture. Will monitor oxygen saturation level closely as well as respiratory effort. Admission oxygen saturation level range 93-98% on 3 L nasal cannula  Active Problems:  COPD exacerbation: Etiology uncertain. Patient states he's been taken his medications. See #1. Will provide Solu-Medrol and empiric antibiotics. Will monitor oxygen saturation level. Continue Spiriva     Sinus tachycardia: Likely related to nebulizer treatment received in the emergency room. Will monitor on telemetry. Use Xopenex nebulizer    Chronic diastolic HF (heart failure): Appears compensated. ProBNP 768. Patient given 60 mg Lasix IV in the emergency room. Continue his home dose starting tomorrow. Continue lisinopril. No beta blocker do to #2. Will obtain daily weights and monitor strict intake and output.  Chronic kidney disease, stage 2, mildly decreased GFR: Appears at baseline. Will monitor     Obesity:  BMI 34. Nutritional consult.    PSORIASIS: Patient states worsening.      Sleep apnea: Patient reports he wears CPAP machine at home. Will ask respiratory to consult. He also reports that after he has worn his CPAP during the night when he takes it off in the morning he feels very short of breath during the day  Code Status: full Family Communication: None present Disposition Plan: Home when ready  Time spent: 60 minutes  Gwenyth Bender [facility] Pager [phone]  If 7PM-7AM, please contact night-coverage [email or web address] Password [facility] 10/05/2012, 2:51 PM

## 2012-10-05 NOTE — ED Provider Notes (Addendum)
CSN: [id number]     Arrival date & time 10/05/12  1059 History    This chart was scribed for [name] Quarry, MD, by Yevette Edwards, ED Scribe. This patient was seen in room APA05/APA05 and the patient's care was started at 11:10 AM.   First MD Initiated Contact with Patient 10/05/12 1114     Chief Complaint  Patient presents with  . Shortness of Breath    The history is provided by the patient and the spouse. No language interpreter was used.   HPI Comments: [name] is a 57 y.o. male, with a h/o of dyspnea and COPD, who presents to the Emergency Department complaining of gradual-onset SOB which began several days ago. He reports that his SOB increases upon reclining and with exertion. The pt visited his PCP, Dr. Juanetta Gosling, three days ago for SOB, and he was prescribed prednisone 10 mg q-day. The pt reports that he has taken his medication as prescribed since Friday.  He states that he is uses oxygen at home.  He also has albuterol at home, and he reports using it approximtely four times during the day and more often at night. He states that he has a non-productive cough, and he states it hurts more on the right side than the left side when he coughs.  He has also experienced swelling to his feet bilaterally.  The pt denies a fever or any appetite changes.  A month ago, the pt was diagnosed at [facility] with CHF.   He weighed 230 lbs when he was discharged from the hospital, and he reports that his weight has remained stable since discharge. The pt denies h/o of blood clots or cardiac infarction.  He has a h/o of HTN and chronic kidney disease. He is a former smoker, and he occasionally uses alcohol.   Dr. Juanetta Gosling is pt's PCP. Past Medical History  Diagnosis Date  . Hypertension   . Dyspnea   . Chronic kidney disease, stage 2, mildly decreased GFR     Creatinine of 1.31 in 04/2011  . COPD (chronic obstructive pulmonary disease)     moderate by spirometry in  08/2009;FEV1 of 1.1 ;nl alpha -1 antitrypsin   . Obesity   . Psoriasis   . Left eye trauma     with loss of vision  . Erectile dysfunction   . Lumbosacral spinal stenosis     chronic low back pain  . Headache(784.0)   . Sleep apnea     O2 at night  . Carotid artery aneurysm    Past Surgical History  Procedure Laterality Date  . Orif patella  12/14/2005    left fracture Dr.Harrison   . Ophthalmologic surgery  1963    secondary to left eye trauma, as a child hit in eye with tree limb   . Colonoscopy N/A 05/06/2012    Procedure: COLONOSCOPY;  Surgeon: Corbin Ade, MD;  Location: [facility];  Service: Endoscopy;  Laterality: N/A;  1:15   Family History  Problem Relation Age of Onset  . Heart disease    . Arthritis    . Lung disease    . Cancer    . Asthma    . Colon cancer Brother     deceased at 63   . Brain cancer Brother    History  Substance Use Topics  . Smoking status: Former Smoker -- 1.00 packs/day for 20 years    Quit date: 06/10/2010  . Smokeless tobacco:  Never Used     Comment: Quit in 06/2010  . Alcohol Use: 0.5 oz/week    1 drink(s) per week     Comment: hx of ETOH abuse, none since hospitalization in December 2013     Review of Systems  Constitutional: Negative for fever and appetite change.  Respiratory: Positive for cough, shortness of breath and wheezing.     Allergies  Bee venom  Home Medications   Current Outpatient Rx  Name  Route  Sig  Dispense  Refill  . ADVAIR DISKUS 250-50 MCG/DOSE AEPB      1 puff 2 (two) times daily.          Marland Kitchen albuterol (PROVENTIL HFA;VENTOLIN HFA) 108 (90 BASE) MCG/ACT inhaler   Inhalation   Inhale 1 puff into the lungs as needed for wheezing (rescue inhaler).         Marland Kitchen amoxicillin-clavulanate (AUGMENTIN) 875-125 MG per tablet   Oral   Take 1 tablet by mouth 2 (two) times daily.   14 tablet   0   . cloNIDine (CATAPRES) 0.2 MG tablet   Oral   Take 0.2 mg by mouth 3 (three) times daily.         .  furosemide (LASIX) 40 MG tablet   Oral   Take 1 tablet (40 mg total) by mouth daily.   30 tablet   5   . HYDROcodone-acetaminophen (NORCO/VICODIN) 5-325 MG per tablet   Oral   Take 1 tablet by mouth every 6 (six) hours as needed.   60 tablet   5   . lisinopril (PRINIVIL,ZESTRIL) 40 MG tablet   Oral   Take 40 mg by mouth daily.          . methotrexate (RHEUMATREX) 2.5 MG tablet   Oral   Take 10 mg by mouth once a week. Caution:Chemotherapy. Protect from light.         . methylPREDNISolone (MEDROL, PAK,) 4 MG tablet      follow package directions   21 tablet   0   . predniSONE (DELTASONE) 10 MG tablet   Oral   Take 10 mg by mouth daily.          . ranitidine (ZANTAC) 300 MG tablet   Oral   Take 300 mg by mouth at bedtime.         . Vitamin D, Ergocalciferol, (DRISDOL) 50000 UNITS CAPS   Oral   Take 50,000 Units by mouth 2 (two) times a week.           Triage Vitals: BP 130/64  Pulse 101  Temp(Src) 98.3 F (36.8 C) (Oral)  Resp 18  Ht 5\' 9"  (1.753 m)  Wt 230 lb (104.327 kg)  BMI 33.95 kg/m2  SpO2 93%  Physical Exam  Nursing note and vitals reviewed. Constitutional: He is oriented to person, place, and time. He appears well-developed and well-nourished. No distress.  HENT:  Head: Normocephalic and atraumatic.  Eyes: EOM are normal.  Neck: Neck supple. No tracheal deviation present.  Cardiovascular: Normal rate.   Pulmonary/Chest: He has wheezes.  Decreased air movement bilaterally at bases.   Few expiratory, very tight, wheezes.  Musculoskeletal: Normal range of motion.  Neurological: He is alert and oriented to person, place, and time.  Skin: Skin is warm and dry.  Psychiatric: He has a normal mood and affect. His behavior is normal.    ED Course   Medications  furosemide (LASIX) injection 40 mg (40 mg Intravenous Given 10/05/12 1220)  ipratropium (ATROVENT) nebulizer solution 0.5 mg (0.5 mg Nebulization Given 10/05/12 1147)  albuterol  (PROVENTIL,VENTOLIN) solution continuous neb (15 mg/hr Nebulization Given 10/05/12 1147)  predniSONE (DELTASONE) tablet 60 mg (60 mg Oral Given 10/05/12 1233)     DIAGNOSTIC STUDIES: Oxygen Saturation is 93% on room air, low by my interpretation.    COORDINATION OF CARE:  11:20 AM-Discussed treatment plan with patient, and the patient agreed to the plan.   Procedures (including critical care time)  Labs Reviewed  CBC WITH DIFFERENTIAL - Abnormal; Notable for the following:    Hemoglobin 12.8 (*)    All other components within normal limits  COMPREHENSIVE METABOLIC PANEL - Abnormal; Notable for the following:    Glucose, Bld 106 (*)    GFR calc non Af Amer 65 (*)    GFR calc Af Amer 75 (*)    All other components within normal limits  PRO B NATRIURETIC PEPTIDE - Abnormal; Notable for the following:    Pro B Natriuretic peptide (BNP) 768.6 (*)    All other components within normal limits  BLOOD GAS, ARTERIAL - Abnormal; Notable for the following:    pCO2 arterial 50.0 (*)    pO2, Arterial 123.0 (*)    Bicarbonate 30.9 (*)    Acid-Base Excess 6.3 (*)    All other components within normal limits  TROPONIN I  URINALYSIS, ROUTINE W REFLEX MICROSCOPIC   Results for orders placed during the hospital encounter of 10/05/12  CBC WITH DIFFERENTIAL      Result Value Range   WBC 5.7  4.0 - 10.5 K/uL   RBC 4.31  4.22 - 5.81 MIL/uL   Hemoglobin 12.8 (*) 13.0 - 17.0 g/dL   HCT 04.5  40.9 - 81.1 %   MCV 91.0  78.0 - 100.0 fL   MCH 29.7  26.0 - 34.0 pg   MCHC 32.7  30.0 - 36.0 g/dL   RDW 91.4  78.2 - 95.6 %   Platelets 246  150 - 400 K/uL   Neutrophils Relative % 73  43 - 77 %   Neutro Abs 4.2  1.7 - 7.7 K/uL   Lymphocytes Relative 19  12 - 46 %   Lymphs Abs 1.1  0.7 - 4.0 K/uL   Monocytes Relative 6  3 - 12 %   Monocytes Absolute 0.4  0.1 - 1.0 K/uL   Eosinophils Relative 2  0 - 5 %   Eosinophils Absolute 0.1  0.0 - 0.7 K/uL   Basophils Relative 1  0 - 1 %   Basophils Absolute 0.0   0.0 - 0.1 K/uL  COMPREHENSIVE METABOLIC PANEL      Result Value Range   Sodium 140  135 - 145 mEq/L   Potassium 4.4  3.5 - 5.1 mEq/L   Chloride 100  96 - 112 mEq/L   CO2 31  19 - 32 mEq/L   Glucose, Bld 106 (*) 70 - 99 mg/dL   BUN 13  6 - 23 mg/dL   Creatinine, Ser 2.13  0.50 - 1.35 mg/dL   Calcium 9.5  8.4 - 08.6 mg/dL   Total Protein 7.3  6.0 - 8.3 g/dL   Albumin 4.0  3.5 - 5.2 g/dL   AST 24  0 - 37 U/L   ALT 28  0 - 53 U/L   Alkaline Phosphatase 62  39 - 117 U/L   Total Bilirubin 0.6  0.3 - 1.2 mg/dL   GFR calc non Af Amer 65 (*) >90 mL/min  GFR calc Af Amer 75 (*) >90 mL/min  PRO B NATRIURETIC PEPTIDE      Result Value Range   Pro B Natriuretic peptide (BNP) 768.6 (*) 0 - 125 pg/mL  BLOOD GAS, ARTERIAL      Result Value Range   O2 Content 3.0     pH, Arterial 7.408  7.350 - 7.450   pCO2 arterial 50.0 (*) 35.0 - 45.0 mmHg   pO2, Arterial 123.0 (*) 80.0 - 100.0 mmHg   Bicarbonate 30.9 (*) 20.0 - 24.0 mEq/L   TCO2 27.3  0 - 100 mmol/L   Acid-Base Excess 6.3 (*) 0.0 - 2.0 mmol/L   O2 Saturation 98.4     Patient temperature 37.0     Collection site RIGHT RADIAL     Drawn by COLLECTED BY RT     Sample type ARTERIAL     Allens test (pass/fail) PASS  PASS  TROPONIN I      Result Value Range   Troponin I <0.30  <0.30 ng/mL    No results found. No diagnosis found.  Date: 10/05/2012  Rate: 98  Rhythm: normal sinus rhythm  QRS Axis: normal  Intervals: normal  ST/T Wave abnormalities: nonspecific ST/T changes  Conduction Disutrbances:none  Narrative Interpretation: anterior q waves  Old EKG Reviewed: changes noted lateral st changes improved from first prior May 2014  Dg Chest 2 View  10/05/2012   *RADIOLOGY REPORT*  Clinical Data: Shortness of breath  CHEST - 2 VIEW  Comparison: 07/20/2012  Findings: Aorta is ectatic and unfolded.  Heart size upper limits of normal.  The lungs are clear but hyper aerated which suggests emphysema.  No pleural effusion.  No acute  osseous finding.  No pneumothorax.  IMPRESSION: Hyperinflation suggestive of emphysema.  No acute cardiopulmonary process.   Original Report Authenticated By: Christiana Pellant, M.D.   MDM  57 y.o. Male with copd and chf presents today with increasing dyspnea and stable weight.  Patient with poor air movement and expiratory wheezes.  He has seen Dr. Juanetta Gosling of Friday and started prednisone at 10 mg per day.  He is using his inhaler up to 10 times per day.  CXR without evidence of failure.  Patient given albuterol 15 and prednisone 60 mg here and continues dyspneic.  EKG without acute changes and troponin negative but bnp slightly elevated.  This appears c.w. Copd exacerbation.  Patient with po2 of 123 o 3 l/m of o2.  No peripheral signs of dvt. Patient continues dyspneic after albuterol and additional prednisone and lasix.  He will require further inpatient treatment where he can be monitored and receive oxygen and observation for any decompensation.   I personally performed the services described in this documentation, which was scribed in my presence. The recorded information has been reviewed and considered.    [name] Quarry, MD 10/05/12 1416  Discussed above with Dr. Irene Limbo and plan admit to telemetry.   [name] Quarry, MD 10/05/12 [phone]

## 2012-10-05 NOTE — H&P (Signed)
Patient seen, independently examined and chart reviewed. I agree with exam, assessment and plan discussed with Toya Smothers, NP.  Very pleasant 57 year old man with history of oxygen-dependent COPD and admission 07/2012 for acute on chronic diastolic congestive heart failure who presented to the emergency department today with a several day history of increasing shortness of breath, dyspnea on exertion and difficulty sleeping secondary to shortness of breath. Despite using nebulizer treatments, prednisone as directed and supplemental oxygen he failed to improve. He saw primary care physician 7/25 and was started on prednisone. He has had quite a bit of coughing and has some) soreness from this. He actually went to see his dermatologist today but because of shortness of breath was directed to the emergency department where he was found to have respiratory distress and presumed COPD exacerbation.  EKG showed sinus rhythm, septal infarct, likely old compared to previous study. No acute changes.  Examined in the emergency department. He appears moderately ill with moderately increased work of breathing and despite marked dyspnea he is able to speak in full sentences and appears nontoxic. There is minimal air movement without frank wheezes, rales or rhonchi.  History and clinical findings most suggestive of acute on chronic respiratory failure secondary to COPD exacerbation. Currently he appears stable for the medical floor, continue treatment with steroids, empiric antibiotics, nebulizers and supplemental oxygen.  Brendia Sacks, MD [facility] [phone]

## 2012-10-05 NOTE — Progress Notes (Signed)
UR Chart Review Completed  

## 2012-10-05 NOTE — ED Notes (Signed)
Pt reports copd "acting up" for 3 days, has been coughing --light green mucus, denies any fever chills at times. Also having right side rib pain, thinks he injured it 3 weeks ago when coughing. Denies any cp.

## 2012-10-05 NOTE — ED Notes (Signed)
Attempted report x1. 

## 2012-10-05 NOTE — Progress Notes (Signed)
cpap set up in patient's room, patient states he does not wear at home but will try to wear tonight. Patient not ready to go on at this time, will come back and place on later in the night.

## 2012-10-06 DIAGNOSIS — I509 Heart failure, unspecified: Secondary | ICD-10-CM

## 2012-10-06 DIAGNOSIS — I5033 Acute on chronic diastolic (congestive) heart failure: Secondary | ICD-10-CM

## 2012-10-06 DIAGNOSIS — I517 Cardiomegaly: Secondary | ICD-10-CM

## 2012-10-06 DIAGNOSIS — J449 Chronic obstructive pulmonary disease, unspecified: Secondary | ICD-10-CM

## 2012-10-06 LAB — CBC
MCH: 28.9 pg (ref 26.0–34.0)
MCHC: 31.7 g/dL (ref 30.0–36.0)
MCV: 91.2 fL (ref 78.0–100.0)
Platelets: 259 10*3/uL (ref 150–400)
RBC: 4.32 MIL/uL (ref 4.22–5.81)
RDW: 13.3 % (ref 11.5–15.5)

## 2012-10-06 LAB — BASIC METABOLIC PANEL
BUN: 17 mg/dL (ref 6–23)
Calcium: 9.5 mg/dL (ref 8.4–10.5)
Creatinine, Ser: 1.25 mg/dL (ref 0.50–1.35)
GFR calc non Af Amer: 62 mL/min — ABNORMAL LOW (ref >90)
Glucose, Bld: 149 mg/dL — ABNORMAL HIGH (ref 70–99)
Sodium: 140 mEq/L (ref 135–145)

## 2012-10-06 MED ORDER — MOMETASONE FURO-FORMOTEROL FUM 200-5 MCG/ACT IN AERO
2.0000 | INHALATION_SPRAY | Freq: Two times a day (BID) | RESPIRATORY_TRACT | Status: DC
  Administered 2012-10-06 – 2012-10-07 (×3): 2 via RESPIRATORY_TRACT
  Filled 2012-10-06: qty 8.8

## 2012-10-06 MED ORDER — HYDROCODONE-HOMATROPINE 5-1.5 MG/5ML PO SYRP
5.0000 mL | ORAL_SOLUTION | ORAL | Status: DC | PRN
  Administered 2012-10-07: 5 mL via ORAL
  Filled 2012-10-06: qty 5

## 2012-10-06 NOTE — Progress Notes (Addendum)
Pt had 6 beat run of non sustained V-tach. Asymptomatic. Pt sleeping at time. VSS. Will continue to monitor.

## 2012-10-06 NOTE — Care Management Note (Signed)
    Page 1 of 1   10/07/2012     2:26:22 PM   CARE MANAGEMENT NOTE 10/07/2012  Patient:  DYLAND, PANUCO   Account Number:  [id number]  Date Initiated:  10/06/2012  Documentation initiated by:  Rosemary Holms  Subjective/Objective Assessment:   Pt admitted from home where he states he lives with a friend. Has an aide that cooks, cleans, ADL. Per his RN, pt is non compliane with CPAP and freq. of Nebs.     Action/Plan:   Refer to [facility].   Anticipated DC Date:  10/07/2012   Anticipated DC Plan:  HOME/SELF CARE      DC Planning Services  CM consult      Choice offered to / List presented to:             Status of service:  Completed, signed off Medicare Important Message given?  NA - LOS <3 / Initial given by admissions (If response is "NO", the following Medicare IM given date fields will be blank) Date Medicare IM given:   Date Additional Medicare IM given:    Discharge Disposition:  HOME/SELF CARE  Per UR Regulation:    If discussed at Long Length of Stay Meetings, dates discussed:    Comments:  10/07/12 Rosemary Holms RN BSN CM [facility] will  oversee pt when he goes to see Dr. Juanetta Gosling. Too deep into [place] for [facility] to follow at home. [employer] RN also calling pt but he has not answered or returned calls. [employer] aide comes 2 1/2 hrs a day and will work with Radiation protection practitioner to reach pt and follow telephonicly.  10/06/12 Rosemary Holms RN BSN CM

## 2012-10-06 NOTE — Progress Notes (Signed)
Subjective: He says he feels somewhat better. When I saw him in my office last week he seemed to be having more of a COPD exacerbation but now it seems more like it may be CHF or some combination. However his weight has not changed. His BNP as an outpatient was slightly elevated but much worse now.  Objective: Vital signs in last 24 hours: Temp:  [97.6 F (36.4 C)-98.3 F (36.8 C)] 98.2 F (36.8 C) (07/29 0609) Pulse Rate:  [93-116] 99 (07/29 0609) Resp:  [18-21] 20 (07/29 0609) BP: (123-148)/(58-78) 123/72 mmHg (07/29 0609) SpO2:  [92 %-98 %] 93 % (07/29 0705) Weight:  [103.148 kg (227 lb 6.4 oz)-104.327 kg (230 lb)] 103.148 kg (227 lb 6.4 oz) (07/29 0416) Weight change:  Last BM Date: 10/05/12  Intake/Output from previous day: 07/28 0701 - 07/29 0700 In: 240 [P.O.:240] Out: 915 [Urine:915]  PHYSICAL EXAM General appearance: alert, cooperative and mild distress Resp: rhonchi bilaterally Cardio: regular rate and rhythm, S1, S2 normal, no murmur, click, rub or gallop GI: soft, non-tender; bowel sounds normal; no masses,  no organomegaly Extremities: extremities normal, atraumatic, no cyanosis or edema  Lab Results:    Basic Metabolic Panel:  Recent Labs  96/04/54 1141 10/06/12 0501  NA 140 140  K 4.4 3.9  CL 100 99  CO2 31 33*  GLUCOSE 106* 149*  BUN 13 17  CREATININE 1.20 1.25  CALCIUM 9.5 9.5   Liver Function Tests:  Recent Labs  10/05/12 1141  AST 24  ALT 28  ALKPHOS 62  BILITOT 0.6  PROT 7.3  ALBUMIN 4.0   No results found for this basename: LIPASE, AMYLASE,  in the last 72 hours No results found for this basename: AMMONIA,  in the last 72 hours CBC:  Recent Labs  10/05/12 1141 10/06/12 0501  WBC 5.7 7.6  NEUTROABS 4.2  --   HGB 12.8* 12.5*  HCT 39.2 39.4  MCV 91.0 91.2  PLT 246 259   Cardiac Enzymes:  Recent Labs  10/05/12 1141  TROPONINI <0.30   BNP:  Recent Labs  10/05/12 1141  PROBNP 768.6*   D-Dimer: No results found  for this basename: DDIMER,  in the last 72 hours CBG: No results found for this basename: GLUCAP,  in the last 72 hours Hemoglobin A1C: No results found for this basename: HGBA1C,  in the last 72 hours Fasting Lipid Panel: No results found for this basename: CHOL, HDL, LDLCALC, TRIG, CHOLHDL, LDLDIRECT,  in the last 72 hours Thyroid Function Tests: No results found for this basename: TSH, T4TOTAL, FREET4, T3FREE, THYROIDAB,  in the last 72 hours Anemia Panel: No results found for this basename: VITAMINB12, FOLATE, FERRITIN, TIBC, IRON, RETICCTPCT,  in the last 72 hours Coagulation: No results found for this basename: LABPROT, INR,  in the last 72 hours Urine Drug Screen: Drugs of Abuse     Component Value Date/Time   LABOPIA NONE DETECTED 03/01/2012 0019   COCAINSCRNUR POSITIVE* 03/01/2012 0019   LABBENZ NONE DETECTED 03/01/2012 0019   AMPHETMU NONE DETECTED 03/01/2012 0019   THCU NONE DETECTED 03/01/2012 0019   LABBARB NONE DETECTED 03/01/2012 0019    Alcohol Level: No results found for this basename: ETH,  in the last 72 hours Urinalysis:  Recent Labs  10/05/12 2130  COLORURINE YELLOW  LABSPEC 1.015  PHURINE 6.0  GLUCOSEU NEGATIVE  HGBUR NEGATIVE  BILIRUBINUR NEGATIVE  KETONESUR NEGATIVE  PROTEINUR NEGATIVE  UROBILINOGEN 0.2  NITRITE NEGATIVE  LEUKOCYTESUR NEGATIVE   Misc.  Labs:  ABGS  Recent Labs  10/05/12 1155  PHART 7.408  PO2ART 123.0*  TCO2 27.3  HCO3 30.9*   CULTURES No results found for this or any previous visit (from the past 240 hour(s)). Studies/Results: Dg Chest 2 View  10/05/2012   *RADIOLOGY REPORT*  Clinical Data: Shortness of breath  CHEST - 2 VIEW  Comparison: 07/20/2012  Findings: Aorta is ectatic and unfolded.  Heart size upper limits of normal.  The lungs are clear but hyper aerated which suggests emphysema.  No pleural effusion.  No acute osseous finding.  No pneumothorax.  IMPRESSION: Hyperinflation suggestive of emphysema.  No acute  cardiopulmonary process.   Original Report Authenticated By: Christiana Pellant, M.D.    Medications:  Prior to Admission:  Prescriptions prior to admission  Medication Sig Dispense Refill  . ADVAIR DISKUS 250-50 MCG/DOSE AEPB Inhale 1 puff into the lungs 2 (two) times daily.       Marland Kitchen albuterol (PROVENTIL HFA;VENTOLIN HFA) 108 (90 BASE) MCG/ACT inhaler Inhale 2 puffs into the lungs as needed for wheezing (rescue inhaler).       . cloNIDine (CATAPRES) 0.2 MG tablet Take 0.2 mg by mouth 3 (three) times daily.      . Fluticasone Furoate-Vilanterol (BREO ELLIPTA) 100-25 MCG/INH AEPB Inhale 1 puff into the lungs daily as needed (Uses when he doesn't have Advair Inhaler.).      Marland Kitchen furosemide (LASIX) 40 MG tablet Take 1 tablet (40 mg total) by mouth daily.  30 tablet  5  . guaiFENesin (MUCINEX) 600 MG 12 hr tablet Take 1,200 mg by mouth 2 (two) times daily.      Marland Kitchen HYDROcodone-acetaminophen (NORCO/VICODIN) 5-325 MG per tablet Take 1 tablet by mouth every 6 (six) hours as needed for pain.      Marland Kitchen lisinopril (PRINIVIL,ZESTRIL) 40 MG tablet Take 40 mg by mouth daily.       . predniSONE (DELTASONE) 10 MG tablet Take 10 mg by mouth daily.       . ranitidine (ZANTAC) 300 MG tablet Take 300 mg by mouth at bedtime.       Scheduled: . clobetasol ointment   Topical BID  . cloNIDine  0.2 mg Oral TID  . enoxaparin (LOVENOX) injection  40 mg Subcutaneous Q24H  . famotidine  10 mg Oral Daily  . furosemide  40 mg Oral Daily  . guaiFENesin  1,200 mg Oral BID  . ipratropium  0.5 mg Nebulization Q4H  . levalbuterol  0.63 mg Nebulization Q4H  . levofloxacin  500 mg Oral Daily  . lisinopril  40 mg Oral Daily  . methylPREDNISolone (SOLU-MEDROL) injection  60 mg Intravenous Q6H  . senna  1 tablet Oral BID  . sodium chloride  3 mL Intravenous Q12H   Continuous:  FAO:ZHYQMVHQIONGE, acetaminophen, alum & mag hydroxide-simeth, bisacodyl, HYDROcodone-acetaminophen, HYDROcodone-homatropine, HYDROmorphone (DILAUDID)  injection, ondansetron (ZOFRAN) IV, ondansetron, traZODone  Assesment: He has acute on chronic respiratory failure. This may be related to COPD but his BNP has increased significantly. He says he has not had an echocardiogram recently. He has chronic kidney disease which makes management of his CHF a little more difficult. He was started on treatment for COPD exacerbation in my office last week but did not improve. He has sleep apnea on CPAP but he says it feels like he has increasing shortness of breath when he takes the CPAP off. It is not clear to me if this is a form of PND. He apparently had some episodes of ventricular tachycardia  last night based on the notes but I have not seen the strips yet. Principal Problem:   Acute-on-chronic respiratory failure Active Problems:   PSORIASIS   Chronic kidney disease, stage 2, mildly decreased GFR   COPD exacerbation   Sinus tachycardia   Chronic diastolic HF (heart failure)   Obesity   Sleep apnea    Plan:no change in rx.   He will have echo  And cardiology consult.    LOS: 1 day   Carmon Sahli L 10/06/2012, 8:34 AM

## 2012-10-06 NOTE — Progress Notes (Signed)
*  PRELIMINARY RESULTS* Echocardiogram 2D Echocardiogram has been performed.  [name] 10/06/2012, 11:48 AM

## 2012-10-06 NOTE — Progress Notes (Signed)
No further episodes of v-tach. Sleeping.

## 2012-10-06 NOTE — Consult Note (Signed)
CARDIOLOGY CONSULT NOTE  Patient ID: CORDON GASSETT MRN: [id number] DOB/AGE: 15-Oct-1955 57 y.o.  Admit date: 10/05/2012 Referring Physician: Juanetta Gosling, MD Primary Herb Grays, MD Primary Cardiologist: Dietrich Pates, MD Reason for Consultation: CHF  Principal Problem:   Acute-on-chronic respiratory failure Active Problems:   PSORIASIS   Chronic kidney disease, stage 2, mildly decreased GFR   COPD exacerbation   Sinus tachycardia   Chronic diastolic HF (heart failure)   Obesity   Sleep apnea  HPI: [name] is a 57 year old patient with known history of hypertension, diastolic CHF, chronic kidney disease, oxygen dependent COPD, and obstructive sleep apnea on CPAP. The patient was admitted on 10/05/2012 with worsening shortness of breath, PND and orthopnea. The patient had been seen by Dr. Juanetta Gosling in the office and treated with prednisone and nebulizer treatments but had no relief of symptoms. On day of admission, he went to his normal dermatology appointment where he is treated for psoriasis. He became very short of breath. He been having worsening shortness of breath the day prior, there was very short of breath at rest in that office. He was advised to come to the emergency room.      On arrival to the emergency room, the patient's blood pressure was 130/64, O2 sat 92% with a heart rate of 101. Creatinine 1.20 with a BUN of 13. Chest x-ray demonstrated hyperinflation suggestive of emphysema with no acute cardiopulmonary process. Pro BNP however was mildly elevated at 768. Troponin negative. D-dimer was not completed. He was given one dose of IV Lasix in the ER along with nebulizer treatment. It was felt by pulmonologist that he had a component of diastolic CHF. He was placed on by mouth Lasix 40 mg x1 dose on 10/05/2012 continued on nebulizer treatments with prednisone and started on Levaquin. He has lost 3 pounds since admission. He is feeling some better but remains on  oxygen.     The patient was last seen by Dr. Dietrich Pates in 2012, and was lost to cardiology followup after cardiac catheterization completed in February 2012 demonstrating normal coronaries and normal LV function.The patient was admitted in May of 2014 with similar symptoms. Diuresis to 240 pounds at that time. The patient denies medical noncompliance, he weighs himself daily, and has not used salt on his food or eating salty foods. He states he has not gained weight. Review of his discharge weight in May was 240 pounds. On admission he was 230 pounds.    Review of systems complete and found to be negative unless listed above   Past Medical History  Diagnosis Date  . Hypertension   . Dyspnea   . Chronic kidney disease, stage 2, mildly decreased GFR     Creatinine of 1.31 in 04/2011  . COPD (chronic obstructive pulmonary disease)     moderate by spirometry in 08/2009;FEV1 of 1.1 ;nl alpha -1 antitrypsin   . Obesity   . Psoriasis   . Left eye trauma     with loss of vision  . Erectile dysfunction   . Lumbosacral spinal stenosis     chronic low back pain  . Headache(784.0)   . Sleep apnea     O2 at night  . Carotid artery aneurysm     Family History  Problem Relation Age of Onset  . Heart disease    . Arthritis    . Lung disease    . Cancer    . Asthma    . Colon cancer Brother  deceased at 64   . Brain cancer Brother     History   Social History  . Marital Status: Divorced    Spouse Name: N/A    Number of Children: N/A  . Years of Education: N/A   Occupational History  . disabled    Social History Main Topics  . Smoking status: Former Smoker -- 1.00 packs/day for 20 years    Quit date: 06/10/2010  . Smokeless tobacco: Never Used     Comment: Quit in 06/2010  . Alcohol Use: 0.5 oz/week    1 drink(s) per week     Comment: hx of ETOH abuse, none since hospitalization in December 2013   . Drug Use: Yes     Comment: history of marijuna use, none since December 2013    . Sexually Active: No   Other Topics Concern  . Not on file   Social History Narrative  . No narrative on file    Past Surgical History  Procedure Laterality Date  . Orif patella  12/14/2005    left fracture Dr.Harrison   . Ophthalmologic surgery  1963    secondary to left eye trauma, as a child hit in eye with tree limb   . Colonoscopy N/A 05/06/2012    Procedure: COLONOSCOPY;  Surgeon: Corbin Ade, MD;  Location: [facility];  Service: Endoscopy;  Laterality: N/A;  1:15     Prescriptions prior to admission  Medication Sig Dispense Refill  . ADVAIR DISKUS 250-50 MCG/DOSE AEPB Inhale 1 puff into the lungs 2 (two) times daily.       Marland Kitchen albuterol (PROVENTIL HFA;VENTOLIN HFA) 108 (90 BASE) MCG/ACT inhaler Inhale 2 puffs into the lungs as needed for wheezing (rescue inhaler).       . cloNIDine (CATAPRES) 0.2 MG tablet Take 0.2 mg by mouth 3 (three) times daily.      . Fluticasone Furoate-Vilanterol (BREO ELLIPTA) 100-25 MCG/INH AEPB Inhale 1 puff into the lungs daily as needed (Uses when he doesn't have Advair Inhaler.).      Marland Kitchen furosemide (LASIX) 40 MG tablet Take 1 tablet (40 mg total) by mouth daily.  30 tablet  5  . guaiFENesin (MUCINEX) 600 MG 12 hr tablet Take 1,200 mg by mouth 2 (two) times daily.      Marland Kitchen HYDROcodone-acetaminophen (NORCO/VICODIN) 5-325 MG per tablet Take 1 tablet by mouth every 6 (six) hours as needed for pain.      Marland Kitchen lisinopril (PRINIVIL,ZESTRIL) 40 MG tablet Take 40 mg by mouth daily.       . predniSONE (DELTASONE) 10 MG tablet Take 10 mg by mouth daily.       . ranitidine (ZANTAC) 300 MG tablet Take 300 mg by mouth at bedtime.        Physical Exam: Blood pressure 113/64, pulse 96, temperature 98.1 F (36.7 C), temperature source Oral, resp. rate 20, height 5\' 9"  (1.753 m), weight 227 lb 6.4 oz (103.148 kg), SpO2 91.00%.  General: Well developed, well nourished,  obese in no acute distress Head: Eyes PERRLA, Left eye deviates to the left. No xanthomas.    Normal cephalic and atramatic  Lungs: Clear bilaterally to auscultation but overall diminished breath sounds.  Heart: HRRR S1 S2, without MRG.  Pulses are 2+ & equal.            No carotid bruit. No JVD.  No abdominal bruits.  Abdomen: Bowel sounds are positive, abdomen soft and non-tender without masses or   Hernia's noted. Abdomen  is distended. Msk:  Back normal, normal gait. Normal strength and tone for age. Extremities: No clubbing, cyanosis or edema.  DP +1 Neuro: Alert and oriented X 3. Psych:  Good affect, responds appropriately   Labs:   Lab Results  Component Value Date   WBC 7.6 10/06/2012   HGB 12.5* 10/06/2012   HCT 39.4 10/06/2012   MCV 91.2 10/06/2012   PLT 259 10/06/2012    Recent Labs Lab 10/05/12 1141 10/06/12 0501  NA 140 140  K 4.4 3.9  CL 100 99  CO2 31 33*  BUN 13 17  CREATININE 1.20 1.25  CALCIUM 9.5 9.5  PROT 7.3  --   BILITOT 0.6  --   ALKPHOS 62  --   ALT 28  --   AST 24  --   GLUCOSE 106* 149*      BNP (last 3 results)  Recent Labs  02/29/12 2347 07/20/12 0936 10/05/12 1141  PROBNP 1408.0* 3060.0* 768.6*   Cardiac Cath: 04/2010 ANGIOGRAPHIC DATA:  1. Ventriculography was done in the RAO projection. Overall systolic  function appeared preserved, but because of ventricular ectopy it  was difficult to calculate ejection fraction, and it was difficult  to analyze wall motion per se because of the ventricular ectopy,  however, overall systolic function appeared preserved.  2. The coronary arteries were large and patulous.  3. The left main is really quite large, free of critical disease.  4. The LAD courses to the apex. There are multiple diagonal branches.  The first of which has some septal branches. The LAD is an  extremely large caliber vessel coursing down to the apex, neither  the LAD nor the diagonals have significant focal disease. There  may be some 30% mild plaquing in the distal LAD but given the large  caliber of the vessel,  it is not felt to be hemodynamically  significant.  5. The AV circumflex is really quite small supplies small  posterolateral territory.  6. The right coronary artery is a giant coronary vessel supplying a  posterior descending and posterolateral system, all of which are  without significant focal obstruction.  IMPRESSION:  1. Relatively normal right heart pressures with mild increase in  systemic pressure.  2. Preserved overall left ventricular function.  3. No evidence of high-grade coronary artery obstruction.  4. Current right and left heart pressures that are within the normal  range.  Echocardiogram: 10/06/2012 Left ventricle: Technically difficult study, with poor visualization of endocardial borders and valvular structures. Moderate posterior wall and severe basal septal hypertrophy. Systolic function was vigorous. The estimated ejection fraction was in the range of 65% to 70%. The study is not technically sufficient to allow evaluation of LV diastolic function. - Aortic valve: Aortic regurgitation was present, but cannot assess severity on the basis of this study. - Right ventricle: Hypertrophy is noted.     Radiology: Dg Chest 2 View  10/05/2012   *RADIOLOGY REPORT*  Clinical Data: Shortness of breath  CHEST - 2 VIEW  Comparison: 07/20/2012  Findings: Aorta is ectatic and unfolded.  Heart size upper limits of normal.  The lungs are clear but hyper aerated which suggests emphysema.  No pleural effusion.  No acute osseous finding.  No pneumothorax.  IMPRESSION: Hyperinflation suggestive of emphysema.  No acute cardiopulmonary process.   Original Report Authenticated By: Christiana Pellant, M.D.   EKG: Normal sinus rhythm, T-wave inversion in the septal leads, rate of 97 beats per minute.  ASSESSMENT AND PLAN:  1. Acute on Chronic Diastolic CHF: He has responded to one dose of IV Lasix, and 1 by mouth dose of Lasix 40 mg. He has lost 3 pounds. The patient's baseline weight is  230 pounds (per his report). He continues to have some shortness of breath. Echocardiogram report has been reviewed, with normal ejection fraction, but to technically difficult to assess LV diastolic function. Would continue by mouth Lasix 40 mg daily. He may benefit from a right heart cath. Consider checking D-dimer, although he has not history of DVT or PE.  2. COPD exacerbation: Breathing status has improved somewhat with steroids, nebulizer treatments, and is now been placed on IV Levaquin. He remains on O2 support and CPAP at night. He no longer has symptoms of PND or orthopnea.  3. Hypertension: Blood pressure is currently well-controlled. He remains on ACE inhibitor, and clonidine.  4. CKD Stage 2: Creatinine has remained stable at 1.2 since admission. Review of prior labs demonstrates creatinine of 1.8 and 1.9 in May 2014. Continue by mouth Lasix.  Signed: Bettey Mare. Lyman Bishop NP [facility] 10/06/2012, 4:42 PM Co-Sign MD  The patient was seen and examined, and I agree with the assessment and plan as documented above, with some additional changes. I think the largest component of his dyspnea on exertion appears to be stemming from his COPD (likely acute on chronic exacerbation). Unfortunately, his echo was a technically difficult study and inadequate to assess diastolic function. His LV pressures were normal in 2012. He may have some degree of pulmonary hypertension given his chronic COPD. That being said, he has no leg edema and also has no rales. While his BNP was 768 on hospital admission, it was as high as 3060 on 07/20/2012. I would consider maintaining him on either daily Lasix 20 mg, or on a prn basis. His BP appears to be controlled at present, and this will be of prime importance in keeping him compensated from a diastolic standpoint.

## 2012-10-07 MED ORDER — PREDNISONE 20 MG PO TABS
40.0000 mg | ORAL_TABLET | Freq: Every day | ORAL | Status: DC
  Administered 2012-10-07: 40 mg via ORAL
  Filled 2012-10-07: qty 2

## 2012-10-07 MED ORDER — LIVING BETTER WITH HEART FAILURE BOOK
Freq: Once | Status: AC
  Administered 2012-10-07: 14:00:00
  Filled 2012-10-07: qty 1

## 2012-10-07 MED ORDER — LEVOFLOXACIN 500 MG PO TABS: 500.0000 mg | ORAL_TABLET | Freq: Every day | ORAL | Status: DC

## 2012-10-07 MED ORDER — METHYLPREDNISOLONE (PAK) 4 MG PO TABS: 4.0000 mg | ORAL_TABLET | Freq: Every day | ORAL | Status: DC

## 2012-10-07 NOTE — Plan of Care (Signed)
Problem: Discharge Progression Outcomes Goal: Home O2 if indicated Outcome: Completed/Met Date Met:  10/07/12 Pt has home o2 that he uses as needed Goal: Other Discharge Outcomes/Goals Outcome: Completed/Met Date Met:  10/07/12 Discharged to home with friend  Pt will follow up as ordered

## 2012-10-07 NOTE — Progress Notes (Signed)
Pt ambulated in hall without O2 returned to room O2 % 95% on room air

## 2012-10-07 NOTE — Clinical Social Work Note (Signed)
CSW faxed letter stating pt is in hospital to pt's lawyer at his request as he had a court date today. Pt reviewed letter and approved and signed release which is on chart.  Derenda Fennel, [place] [phone]

## 2012-10-07 NOTE — Progress Notes (Signed)
Subjective: He is much improved. He says he wants to go home. He has no new complaints.  Objective: Vital signs in last 24 hours: Temp:  [97.6 F (36.4 C)-98.1 F (36.7 C)] 97.6 F (36.4 C) (07/30 0539) Pulse Rate:  [87-96] 87 (07/30 0539) Resp:  [20] 20 (07/30 0539) BP: (100-115)/(58-65) 115/65 mmHg (07/30 0539) SpO2:  [91 %-98 %] 95 % (07/30 0642) Weight:  [104.327 kg (230 lb)] 104.327 kg (230 lb) (07/30 0421) Weight change: 0 kg (0 lb) Last BM Date: 10/05/12  Intake/Output from previous day: 07/29 0701 - 07/30 0700 In: 900 [P.O.:900] Out: 1300 [Urine:1300]  PHYSICAL EXAM General appearance: alert, cooperative and no distress Resp: rhonchi bilaterally Cardio: regular rate and rhythm, S1, S2 normal, no murmur, click, rub or gallop GI: soft, non-tender; bowel sounds normal; no masses,  no organomegaly Extremities: extremities normal, atraumatic, no cyanosis or edema  Lab Results:    Basic Metabolic Panel:  Recent Labs  16/10/96 1141 10/06/12 0501  NA 140 140  K 4.4 3.9  CL 100 99  CO2 31 33*  GLUCOSE 106* 149*  BUN 13 17  CREATININE 1.20 1.25  CALCIUM 9.5 9.5   Liver Function Tests:  Recent Labs  10/05/12 1141  AST 24  ALT 28  ALKPHOS 62  BILITOT 0.6  PROT 7.3  ALBUMIN 4.0   No results found for this basename: LIPASE, AMYLASE,  in the last 72 hours No results found for this basename: AMMONIA,  in the last 72 hours CBC:  Recent Labs  10/05/12 1141 10/06/12 0501  WBC 5.7 7.6  NEUTROABS 4.2  --   HGB 12.8* 12.5*  HCT 39.2 39.4  MCV 91.0 91.2  PLT 246 259   Cardiac Enzymes:  Recent Labs  10/05/12 1141  TROPONINI <0.30   BNP:  Recent Labs  10/05/12 1141  PROBNP 768.6*   D-Dimer: No results found for this basename: DDIMER,  in the last 72 hours CBG: No results found for this basename: GLUCAP,  in the last 72 hours Hemoglobin A1C: No results found for this basename: HGBA1C,  in the last 72 hours Fasting Lipid Panel: No results  found for this basename: CHOL, HDL, LDLCALC, TRIG, CHOLHDL, LDLDIRECT,  in the last 72 hours Thyroid Function Tests: No results found for this basename: TSH, T4TOTAL, FREET4, T3FREE, THYROIDAB,  in the last 72 hours Anemia Panel: No results found for this basename: VITAMINB12, FOLATE, FERRITIN, TIBC, IRON, RETICCTPCT,  in the last 72 hours Coagulation: No results found for this basename: LABPROT, INR,  in the last 72 hours Urine Drug Screen: Drugs of Abuse     Component Value Date/Time   LABOPIA NONE DETECTED 03/01/2012 0019   COCAINSCRNUR POSITIVE* 03/01/2012 0019   LABBENZ NONE DETECTED 03/01/2012 0019   AMPHETMU NONE DETECTED 03/01/2012 0019   THCU NONE DETECTED 03/01/2012 0019   LABBARB NONE DETECTED 03/01/2012 0019    Alcohol Level: No results found for this basename: ETH,  in the last 72 hours Urinalysis:  Recent Labs  10/05/12 2130  COLORURINE YELLOW  LABSPEC 1.015  PHURINE 6.0  GLUCOSEU NEGATIVE  HGBUR NEGATIVE  BILIRUBINUR NEGATIVE  KETONESUR NEGATIVE  PROTEINUR NEGATIVE  UROBILINOGEN 0.2  NITRITE NEGATIVE  LEUKOCYTESUR NEGATIVE   Misc. Labs:  ABGS  Recent Labs  10/05/12 1155  PHART 7.408  PO2ART 123.0*  TCO2 27.3  HCO3 30.9*   CULTURES No results found for this or any previous visit (from the past 240 hour(s)). Studies/Results: Dg Chest 2 View  10/05/2012   *RADIOLOGY REPORT*  Clinical Data: Shortness of breath  CHEST - 2 VIEW  Comparison: 07/20/2012  Findings: Aorta is ectatic and unfolded.  Heart size upper limits of normal.  The lungs are clear but hyper aerated which suggests emphysema.  No pleural effusion.  No acute osseous finding.  No pneumothorax.  IMPRESSION: Hyperinflation suggestive of emphysema.  No acute cardiopulmonary process.   Original Report Authenticated By: Christiana Pellant, M.D.    Medications:  Prior to Admission:  Prescriptions prior to admission  Medication Sig Dispense Refill  . ADVAIR DISKUS 250-50 MCG/DOSE AEPB Inhale 1  puff into the lungs 2 (two) times daily.       Marland Kitchen albuterol (PROVENTIL HFA;VENTOLIN HFA) 108 (90 BASE) MCG/ACT inhaler Inhale 2 puffs into the lungs as needed for wheezing (rescue inhaler).       . cloNIDine (CATAPRES) 0.2 MG tablet Take 0.2 mg by mouth 3 (three) times daily.      . Fluticasone Furoate-Vilanterol (BREO ELLIPTA) 100-25 MCG/INH AEPB Inhale 1 puff into the lungs daily as needed (Uses when he doesn't have Advair Inhaler.).      Marland Kitchen furosemide (LASIX) 40 MG tablet Take 1 tablet (40 mg total) by mouth daily.  30 tablet  5  . guaiFENesin (MUCINEX) 600 MG 12 hr tablet Take 1,200 mg by mouth 2 (two) times daily.      Marland Kitchen HYDROcodone-acetaminophen (NORCO/VICODIN) 5-325 MG per tablet Take 1 tablet by mouth every 6 (six) hours as needed for pain.      Marland Kitchen lisinopril (PRINIVIL,ZESTRIL) 40 MG tablet Take 40 mg by mouth daily.       . predniSONE (DELTASONE) 10 MG tablet Take 10 mg by mouth daily.       . ranitidine (ZANTAC) 300 MG tablet Take 300 mg by mouth at bedtime.       Scheduled: . clobetasol ointment   Topical BID  . cloNIDine  0.2 mg Oral TID  . enoxaparin (LOVENOX) injection  40 mg Subcutaneous Q24H  . famotidine  10 mg Oral Daily  . furosemide  40 mg Oral Daily  . guaiFENesin  1,200 mg Oral BID  . ipratropium  0.5 mg Nebulization Q4H  . levalbuterol  0.63 mg Nebulization Q4H  . levofloxacin  500 mg Oral Daily  . lisinopril  40 mg Oral Daily  . mometasone-formoterol  2 puff Inhalation BID  . predniSONE  40 mg Oral Q breakfast  . senna  1 tablet Oral BID  . sodium chloride  3 mL Intravenous Q12H   Continuous:  WUJ:[id number], acetaminophen, alum & mag hydroxide-simeth, bisacodyl, HYDROcodone-acetaminophen, HYDROcodone-homatropine, HYDROmorphone (DILAUDID) injection, ondansetron (ZOFRAN) IV, ondansetron, traZODone  Assesment:he has severe COPD with acute on chronic respiratory failure. He has some element of chronic diastolic heart failure but that does not seem to be playing a  large role. He is markedly improved.  Principal Problem:   Acute-on-chronic respiratory failure Active Problems:   PSORIASIS   Chronic kidney disease, stage 2, mildly decreased GFR   COPD exacerbation   Sinus tachycardia   Chronic diastolic HF (heart failure)   Obesity   Sleep apnea    Plan: I will switch his medications to by mouth and see how he does today. If he does well he may be able to go home later today. If he has trouble I think he can go home tomorrow    LOS: 2 days   Mahad Newstrom L 10/07/2012, 8:49 AM

## 2012-10-07 NOTE — Progress Notes (Signed)
Thank you to [facility] RNCM for this referral.  Patient evaluated for community based chronic disease management services with [facility] Care Management Program as a benefit of patient's [employer].  Patient was admitted on  7.27.14 for acute on chronic COPD.  He was diagnosed with CHF two months ago, but this is apparently stable.  Since this change he has experienced some barriers with managing his care.  Spoke with patient at bedside to explain [facility] Care Management services.  He did not get his initial prescription of lasix (per Dr. Juanetta Gosling) filled two months ago.  At that time he indicated that his pharmacy did not have the drug available.  He expressed that he currently takes the medication as ordered.  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  However prior to admission he indicated that upon weighing at his dermatologist office he was 3 pounds above his baseline weigh of 230 pounds.  He is active with [facility] [phone]) for [facility] services.  Called [facility] and spoke with Manuela Schwartz RN who confirmed that he receives PCS assistance 2.5 hours a day 7 days a week.  They can comply with an order to weigh the patient daily and report to Dr Juanetta Gosling weekly.  Will request Amy [facility] to fax the discharge summary to [facility] at discharge (Fax: [phone]).  Mrs. Greer Pickerel indicated that she will visit the patient at home the day after discharge to reactivate his PCS care.  Patient expressed that he receives calls from a Surveyor, quantity. Called [employer] [phone]).  Confirmed telephonic nurse as Apolinar Junes RN ([phone] ext. [phone]).  She has had difficulty reaching the patient and he does not call back per her voice message request.  Her fax number is [phone] and email is [email or web address].  She will attempt a transition of care call upon discharge notification.   Will request that she also receive a copy of the discharge summary.  Patient has consented to services, but due to his geographic location in [place] he is not eligible for home visits.  We will however provide him with transition of care support and work collaboratively with any home health skilled nursing that is elected at discharge (to reduce the risk of service duplications).  In addition, we can attend his visits to Dr Juanetta Gosling office to ensure continuity of care.  call from his and will be evaluated for monthly home visits for assessments and disease process education.  Left contact information and [facility] literature at bedside with patient. Made inpatient Case Manager aware that [facility] Care Management following. Of note, [facility] Care Management services does not replace or interfere with any services that are arranged by inpatient case management or social work.  For additional questions or referrals please contact Anibal Henderson BSN RN [facility] Liaison at [phone].

## 2012-10-08 NOTE — Discharge Summary (Signed)
Physician Discharge Summary  Patient ID: [name] MRN: [id number] DOB/AGE: May 06, 1955 57 y.o. Primary Care Physician:Shanyia Stines L, MD Admit date: 10/05/2012 Discharge date: 10/08/2012    Discharge Diagnoses:   Principal Problem:   Acute-on-chronic respiratory failure Active Problems:   PSORIASIS   Chronic kidney disease, stage 2, mildly decreased GFR   COPD exacerbation   Sinus tachycardia   Chronic diastolic HF (heart failure)   Obesity   Sleep apnea     Medication List         ADVAIR DISKUS 250-50 MCG/DOSE Aepb  Generic drug:  Fluticasone-Salmeterol  Inhale 1 puff into the lungs 2 (two) times daily.     albuterol 108 (90 BASE) MCG/ACT inhaler  Commonly known as:  PROVENTIL HFA;VENTOLIN HFA  Inhale 2 puffs into the lungs as needed for wheezing (rescue inhaler).     BREO ELLIPTA 100-25 MCG/INH Aepb  Generic drug:  Fluticasone Furoate-Vilanterol  Inhale 1 puff into the lungs daily as needed (Uses when he doesn't have Advair Inhaler.).     cloNIDine 0.2 MG tablet  Commonly known as:  CATAPRES  Take 0.2 mg by mouth 3 (three) times daily.     furosemide 40 MG tablet  Commonly known as:  LASIX  Take 1 tablet (40 mg total) by mouth daily.     guaiFENesin 600 MG 12 hr tablet  Commonly known as:  MUCINEX  Take 1,200 mg by mouth 2 (two) times daily.     HYDROcodone-acetaminophen 5-325 MG per tablet  Commonly known as:  NORCO/VICODIN  Take 1 tablet by mouth every 6 (six) hours as needed for pain.     levofloxacin 500 MG tablet  Commonly known as:  LEVAQUIN  Take 1 tablet (500 mg total) by mouth daily.     lisinopril 40 MG tablet  Commonly known as:  PRINIVIL,ZESTRIL  Take 40 mg by mouth daily.     methylPREDNIsolone 4 MG tablet  Commonly known as:  MEDROL DOSPACK  Take 1 tablet (4 mg total) by mouth daily. follow package directions     predniSONE 10 MG tablet  Commonly known as:  DELTASONE  Take 10 mg by mouth daily.     ranitidine 300 MG  tablet  Commonly known as:  ZANTAC  Take 300 mg by mouth at bedtime.        Discharged Condition: Improved    Consults: Cardiology  Significant Diagnostic Studies: Dg Chest 2 View  10/05/2012   *RADIOLOGY REPORT*  Clinical Data: Shortness of breath  CHEST - 2 VIEW  Comparison: 07/20/2012  Findings: Aorta is ectatic and unfolded.  Heart size upper limits of normal.  The lungs are clear but hyper aerated which suggests emphysema.  No pleural effusion.  No acute osseous finding.  No pneumothorax.  IMPRESSION: Hyperinflation suggestive of emphysema.  No acute cardiopulmonary process.   Original Report Authenticated By: Christiana Pellant, M.D.    Lab Results: Basic Metabolic Panel:  Recent Labs  78/29/56 1141 10/06/12 0501  NA 140 140  K 4.4 3.9  CL 100 99  CO2 31 33*  GLUCOSE 106* 149*  BUN 13 17  CREATININE 1.20 1.25  CALCIUM 9.5 9.5   Liver Function Tests:  Recent Labs  10/05/12 1141  AST 24  ALT 28  ALKPHOS 62  BILITOT 0.6  PROT 7.3  ALBUMIN 4.0     CBC:  Recent Labs  10/05/12 1141 10/06/12 0501  WBC 5.7 7.6  NEUTROABS 4.2  --   HGB 12.8* 12.5*  HCT 39.2 39.4  MCV 91.0 91.2  PLT 246 259    No results found for this or any previous visit (from the past 240 hour(s)).   Hospital Course: He was admitted with worsening shortness of breath. He's been having trouble for 3 or 4 days. His BNP which had been approximately 200 and office was up around 900 on admission. He was given Lasix in the emergency room. It was not clear during his hospitalization how. much of his problem was related to COPD and how much to CHF. It was felt that the majority of it was COPD. He was placed on antibiotics and steroids initially intravenously and then switched to by mouth. He did well on by mouth steroids and antibiotics and was ready for discharge him back to baseline  Discharge Exam: Blood pressure 112/58, pulse 91, temperature 97.6 F (36.4 C), temperature source Oral, resp.  rate 24, height 5\' 9"  (1.753 m), weight 104.327 kg (230 lb), SpO2 95.00%. He is awake and alert. His chest shows rhonchi bilaterally. Heart is regular without gallop. He does not have any peripheral edema  Disposition: Home with home health services      Discharge Orders   Future Orders Complete By Expires     Discharge patient  As directed          Signed: Dencil Cayson L Pager [phone]  10/08/2012, 8:41 AM

## 2012-10-26 ENCOUNTER — Other Ambulatory Visit: Payer: Self-pay | Admitting: *Deleted

## 2012-10-26 DIAGNOSIS — M7652 Patellar tendinitis, left knee: Secondary | ICD-10-CM

## 2012-10-26 MED ORDER — HYDROCODONE-ACETAMINOPHEN 5-325 MG PO TABS: 1.0000 | ORAL_TABLET | Freq: Four times a day (QID) | ORAL | Status: DC | PRN

## 2012-11-24 ENCOUNTER — Other Ambulatory Visit: Payer: Self-pay | Admitting: *Deleted

## 2012-11-24 DIAGNOSIS — M7652 Patellar tendinitis, left knee: Secondary | ICD-10-CM

## 2012-11-24 MED ORDER — HYDROCODONE-ACETAMINOPHEN 5-325 MG PO TABS: 1.0000 | ORAL_TABLET | Freq: Four times a day (QID) | ORAL | Status: DC | PRN

## 2012-11-28 ENCOUNTER — Encounter (HOSPITAL_COMMUNITY): Payer: Self-pay | Admitting: Emergency Medicine

## 2012-11-28 ENCOUNTER — Emergency Department (HOSPITAL_COMMUNITY): Payer: Medicare HMO

## 2012-11-28 ENCOUNTER — Inpatient Hospital Stay (HOSPITAL_COMMUNITY)
Admission: EM | Admit: 2012-11-28 | Discharge: 2012-12-01 | DRG: 291 | Disposition: A | Discharge status: Home Health | Payer: Medicare HMO | Attending: Pulmonary Disease | Admitting: Pulmonary Disease

## 2012-11-28 DIAGNOSIS — J449 Chronic obstructive pulmonary disease, unspecified: Secondary | ICD-10-CM

## 2012-11-28 DIAGNOSIS — J9620 Acute and chronic respiratory failure, unspecified whether with hypoxia or hypercapnia: Secondary | ICD-10-CM | POA: Diagnosis present

## 2012-11-28 DIAGNOSIS — J441 Chronic obstructive pulmonary disease with (acute) exacerbation: Secondary | ICD-10-CM | POA: Diagnosis present

## 2012-11-28 DIAGNOSIS — J9600 Acute respiratory failure, unspecified whether with hypoxia or hypercapnia: Secondary | ICD-10-CM

## 2012-11-28 DIAGNOSIS — I129 Hypertensive chronic kidney disease with stage 1 through stage 4 chronic kidney disease, or unspecified chronic kidney disease: Secondary | ICD-10-CM | POA: Diagnosis present

## 2012-11-28 DIAGNOSIS — G8929 Other chronic pain: Secondary | ICD-10-CM | POA: Diagnosis present

## 2012-11-28 DIAGNOSIS — L408 Other psoriasis: Secondary | ICD-10-CM | POA: Diagnosis present

## 2012-11-28 DIAGNOSIS — I10 Essential (primary) hypertension: Secondary | ICD-10-CM | POA: Diagnosis present

## 2012-11-28 DIAGNOSIS — Z87891 Personal history of nicotine dependence: Secondary | ICD-10-CM

## 2012-11-28 DIAGNOSIS — Z91199 Patient's noncompliance with other medical treatment and regimen due to unspecified reason: Secondary | ICD-10-CM

## 2012-11-28 DIAGNOSIS — Z9981 Dependence on supplemental oxygen: Secondary | ICD-10-CM

## 2012-11-28 DIAGNOSIS — E669 Obesity, unspecified: Secondary | ICD-10-CM | POA: Diagnosis present

## 2012-11-28 DIAGNOSIS — J4489 Other specified chronic obstructive pulmonary disease: Secondary | ICD-10-CM | POA: Diagnosis present

## 2012-11-28 DIAGNOSIS — M48061 Spinal stenosis, lumbar region without neurogenic claudication: Secondary | ICD-10-CM | POA: Diagnosis present

## 2012-11-28 DIAGNOSIS — R9431 Abnormal electrocardiogram [ECG] [EKG]: Secondary | ICD-10-CM | POA: Diagnosis present

## 2012-11-28 DIAGNOSIS — E662 Morbid (severe) obesity with alveolar hypoventilation: Secondary | ICD-10-CM

## 2012-11-28 DIAGNOSIS — R600 Localized edema: Secondary | ICD-10-CM | POA: Diagnosis present

## 2012-11-28 DIAGNOSIS — G4730 Sleep apnea, unspecified: Secondary | ICD-10-CM

## 2012-11-28 DIAGNOSIS — I509 Heart failure, unspecified: Secondary | ICD-10-CM | POA: Diagnosis present

## 2012-11-28 DIAGNOSIS — I5030 Unspecified diastolic (congestive) heart failure: Secondary | ICD-10-CM

## 2012-11-28 DIAGNOSIS — M129 Arthropathy, unspecified: Secondary | ICD-10-CM | POA: Diagnosis present

## 2012-11-28 DIAGNOSIS — I5033 Acute on chronic diastolic (congestive) heart failure: Principal | ICD-10-CM | POA: Diagnosis present

## 2012-11-28 DIAGNOSIS — N179 Acute kidney failure, unspecified: Secondary | ICD-10-CM

## 2012-11-28 DIAGNOSIS — Z9119 Patient's noncompliance with other medical treatment and regimen: Secondary | ICD-10-CM

## 2012-11-28 DIAGNOSIS — R609 Edema, unspecified: Secondary | ICD-10-CM

## 2012-11-28 DIAGNOSIS — N182 Chronic kidney disease, stage 2 (mild): Secondary | ICD-10-CM | POA: Diagnosis present

## 2012-11-28 DIAGNOSIS — R000 Tachycardia, unspecified: Secondary | ICD-10-CM

## 2012-11-28 DIAGNOSIS — R0603 Acute respiratory distress: Secondary | ICD-10-CM | POA: Diagnosis present

## 2012-11-28 DIAGNOSIS — J209 Acute bronchitis, unspecified: Secondary | ICD-10-CM

## 2012-11-28 DIAGNOSIS — G4733 Obstructive sleep apnea (adult) (pediatric): Secondary | ICD-10-CM | POA: Diagnosis present

## 2012-11-28 HISTORY — DX: Unspecified asthma, uncomplicated: J45.909

## 2012-11-28 LAB — BASIC METABOLIC PANEL
BUN: 10 mg/dL (ref 6–23)
Calcium: 9.5 mg/dL (ref 8.4–10.5)
Creatinine, Ser: 1.23 mg/dL (ref 0.50–1.35)
GFR calc Af Amer: 73 mL/min — ABNORMAL LOW (ref >90)
GFR calc non Af Amer: 63 mL/min — ABNORMAL LOW (ref >90)
Glucose, Bld: 106 mg/dL — ABNORMAL HIGH (ref 70–99)

## 2012-11-28 LAB — GLUCOSE, CAPILLARY
Glucose-Capillary: 140 mg/dL — ABNORMAL HIGH (ref 70–99)
Glucose-Capillary: 149 mg/dL — ABNORMAL HIGH (ref 70–99)

## 2012-11-28 LAB — BLOOD GAS, ARTERIAL
Bicarbonate: 31.7 mEq/L — ABNORMAL HIGH (ref 20.0–24.0)
Expiratory PAP: 8
Patient temperature: 37
TCO2: 28.6 mmol/L (ref 0–100)
pCO2 arterial: 54.1 mmHg — ABNORMAL HIGH (ref 35.0–45.0)
pH, Arterial: 7.386 (ref 7.350–7.450)
pO2, Arterial: 106 mmHg — ABNORMAL HIGH (ref 80.0–100.0)

## 2012-11-28 LAB — CBC WITH DIFFERENTIAL/PLATELET
Eosinophils Relative: 1 % (ref 0–5)
Lymphocytes Relative: 14 % (ref 12–46)
MCV: 93 fL (ref 78.0–100.0)
Monocytes Relative: 9 % (ref 3–12)
Platelets: 242 10*3/uL (ref 150–400)
RBC: 4.16 MIL/uL — ABNORMAL LOW (ref 4.22–5.81)
RDW: 14.6 % (ref 11.5–15.5)
WBC: 5.6 10*3/uL (ref 4.0–10.5)

## 2012-11-28 LAB — MRSA PCR SCREENING: MRSA by PCR: NEGATIVE

## 2012-11-28 MED ORDER — ONDANSETRON HCL 4 MG/2ML IJ SOLN: 4.0000 mg | Freq: Four times a day (QID) | INTRAMUSCULAR | Status: DC | PRN

## 2012-11-28 MED ORDER — ONDANSETRON HCL 4 MG PO TABS: 4.0000 mg | ORAL_TABLET | Freq: Four times a day (QID) | ORAL | Status: DC | PRN

## 2012-11-28 MED ORDER — FAMOTIDINE 20 MG PO TABS
40.0000 mg | ORAL_TABLET | Freq: Every day | ORAL | Status: DC
  Administered 2012-11-28 – 2012-11-30 (×3): 40 mg via ORAL
  Filled 2012-11-28 (×3): qty 2

## 2012-11-28 MED ORDER — SODIUM CHLORIDE 0.9 % IJ SOLN: 3.0000 mL | INTRAMUSCULAR | Status: DC | PRN

## 2012-11-28 MED ORDER — ENOXAPARIN SODIUM 40 MG/0.4ML ~~LOC~~ SOLN
40.0000 mg | SUBCUTANEOUS | Status: DC
  Administered 2012-11-28 – 2012-11-30 (×3): 40 mg via SUBCUTANEOUS
  Filled 2012-11-28 (×3): qty 0.4

## 2012-11-28 MED ORDER — INSULIN ASPART 100 UNIT/ML ~~LOC~~ SOLN
0.0000 [IU] | Freq: Three times a day (TID) | SUBCUTANEOUS | Status: DC
  Administered 2012-11-28 – 2012-11-30 (×6): 2 [IU] via SUBCUTANEOUS
  Administered 2012-11-30: 8 [IU] via SUBCUTANEOUS

## 2012-11-28 MED ORDER — DEXTROSE 5 % IV SOLN
1.0000 g | INTRAVENOUS | Status: DC
  Administered 2012-11-28 – 2012-11-30 (×3): 1 g via INTRAVENOUS
  Filled 2012-11-28 (×4): qty 10

## 2012-11-28 MED ORDER — LEVOFLOXACIN IN D5W 500 MG/100ML IV SOLN
500.0000 mg | Freq: Once | INTRAVENOUS | Status: AC
  Administered 2012-11-28: 500 mg via INTRAVENOUS
  Filled 2012-11-28: qty 100

## 2012-11-28 MED ORDER — FUROSEMIDE 10 MG/ML IJ SOLN
60.0000 mg | Freq: Once | INTRAMUSCULAR | Status: AC
  Administered 2012-11-28: 60 mg via INTRAVENOUS
  Filled 2012-11-28: qty 6

## 2012-11-28 MED ORDER — MAGNESIUM SULFATE 50 % IJ SOLN: 2.0000 g | Freq: Once | INTRAVENOUS | Status: DC

## 2012-11-28 MED ORDER — ALBUTEROL SULFATE (5 MG/ML) 0.5% IN NEBU
2.5000 mg | INHALATION_SOLUTION | RESPIRATORY_TRACT | Status: DC
  Administered 2012-11-28 – 2012-12-01 (×18): 2.5 mg via RESPIRATORY_TRACT
  Filled 2012-11-28 (×17): qty 0.5

## 2012-11-28 MED ORDER — PANTOPRAZOLE SODIUM 40 MG IV SOLR
40.0000 mg | INTRAVENOUS | Status: DC
  Administered 2012-11-28 – 2012-11-29 (×2): 40 mg via INTRAVENOUS
  Filled 2012-11-28 (×2): qty 40

## 2012-11-28 MED ORDER — ALBUTEROL SULFATE (5 MG/ML) 0.5% IN NEBU
5.0000 mg | INHALATION_SOLUTION | Freq: Once | RESPIRATORY_TRACT | Status: AC
  Administered 2012-11-28: 5 mg via RESPIRATORY_TRACT
  Filled 2012-11-28: qty 1

## 2012-11-28 MED ORDER — GUAIFENESIN ER 600 MG PO TB12
1200.0000 mg | ORAL_TABLET | Freq: Two times a day (BID) | ORAL | Status: DC
Start: 2012-11-28 — End: 2012-12-01
  Administered 2012-11-28 – 2012-12-01 (×7): 1200 mg via ORAL
  Filled 2012-11-28 (×7): qty 2

## 2012-11-28 MED ORDER — LISINOPRIL 10 MG PO TABS
40.0000 mg | ORAL_TABLET | Freq: Every day | ORAL | Status: DC
  Administered 2012-11-29 – 2012-12-01 (×3): 40 mg via ORAL
  Filled 2012-11-28 (×4): qty 4

## 2012-11-28 MED ORDER — CLONIDINE HCL 0.2 MG PO TABS
0.2000 mg | ORAL_TABLET | Freq: Three times a day (TID) | ORAL | Status: DC
  Administered 2012-11-28 – 2012-12-01 (×7): 0.2 mg via ORAL
  Filled 2012-11-28 (×8): qty 1

## 2012-11-28 MED ORDER — METHYLPREDNISOLONE SODIUM SUCC 125 MG IJ SOLR
80.0000 mg | Freq: Four times a day (QID) | INTRAMUSCULAR | Status: DC
  Administered 2012-11-28 – 2012-11-30 (×7): 80 mg via INTRAVENOUS
  Filled 2012-11-28 (×7): qty 2

## 2012-11-28 MED ORDER — MAGNESIUM SULFATE 40 MG/ML IJ SOLN
2.0000 g | Freq: Once | INTRAMUSCULAR | Status: AC
  Administered 2012-11-28: 2 g via INTRAVENOUS
  Filled 2012-11-28: qty 50

## 2012-11-28 MED ORDER — FUROSEMIDE 10 MG/ML IJ SOLN
60.0000 mg | Freq: Two times a day (BID) | INTRAMUSCULAR | Status: DC
  Administered 2012-11-28 – 2012-12-01 (×6): 60 mg via INTRAVENOUS
  Filled 2012-11-28 (×8): qty 6

## 2012-11-28 MED ORDER — SODIUM CHLORIDE 0.9 % IJ SOLN
3.0000 mL | Freq: Two times a day (BID) | INTRAMUSCULAR | Status: DC
  Administered 2012-11-28 – 2012-12-01 (×5): 3 mL via INTRAVENOUS

## 2012-11-28 MED ORDER — DEXTROSE 5 % IV SOLN
INTRAVENOUS | Status: AC
  Filled 2012-11-28: qty 500

## 2012-11-28 MED ORDER — DEXTROSE 5 % IV SOLN
INTRAVENOUS | Status: AC
  Filled 2012-11-28: qty 10

## 2012-11-28 MED ORDER — METHYLPREDNISOLONE SODIUM SUCC 125 MG IJ SOLR
60.0000 mg | Freq: Once | INTRAMUSCULAR | Status: AC
  Administered 2012-11-28: 60 mg via INTRAVENOUS
  Filled 2012-11-28: qty 2

## 2012-11-28 MED ORDER — OXYCODONE HCL 5 MG PO TABS: 5.0000 mg | ORAL_TABLET | ORAL | Status: DC | PRN

## 2012-11-28 MED ORDER — MORPHINE SULFATE 4 MG/ML IJ SOLN: 4.0000 mg | INTRAMUSCULAR | Status: DC | PRN

## 2012-11-28 MED ORDER — SODIUM CHLORIDE 0.9 % IV SOLN: 250.0000 mL | INTRAVENOUS | Status: DC | PRN

## 2012-11-28 MED ORDER — MOMETASONE FURO-FORMOTEROL FUM 100-5 MCG/ACT IN AERO
2.0000 | INHALATION_SPRAY | Freq: Two times a day (BID) | RESPIRATORY_TRACT | Status: DC
  Administered 2012-11-28 – 2012-12-01 (×6): 2 via RESPIRATORY_TRACT
  Filled 2012-11-28: qty 8.8

## 2012-11-28 MED ORDER — DOCUSATE SODIUM 100 MG PO CAPS
100.0000 mg | ORAL_CAPSULE | Freq: Two times a day (BID) | ORAL | Status: DC
  Administered 2012-11-28 – 2012-12-01 (×7): 100 mg via ORAL
  Filled 2012-11-28 (×7): qty 1

## 2012-11-28 MED ORDER — POTASSIUM CHLORIDE CRYS ER 20 MEQ PO TBCR
40.0000 meq | EXTENDED_RELEASE_TABLET | Freq: Every day | ORAL | Status: AC
  Administered 2012-11-28: 40 meq via ORAL
  Filled 2012-11-28: qty 2

## 2012-11-28 MED ORDER — ACETAMINOPHEN 650 MG RE SUPP: 650.0000 mg | Freq: Four times a day (QID) | RECTAL | Status: DC | PRN

## 2012-11-28 MED ORDER — AZITHROMYCIN 500 MG IV SOLR
500.0000 mg | INTRAVENOUS | Status: DC
  Administered 2012-11-28 – 2012-11-29 (×2): 500 mg via INTRAVENOUS
  Filled 2012-11-28 (×2): qty 500

## 2012-11-28 MED ORDER — HYDROCOD POLST-CHLORPHEN POLST 10-8 MG/5ML PO LQCR: 5.0000 mL | Freq: Two times a day (BID) | ORAL | Status: DC | PRN

## 2012-11-28 MED ORDER — ACETAMINOPHEN 325 MG PO TABS: 650.0000 mg | ORAL_TABLET | Freq: Four times a day (QID) | ORAL | Status: DC | PRN

## 2012-11-28 MED ORDER — ALUM & MAG HYDROXIDE-SIMETH 200-200-20 MG/5ML PO SUSP: 30.0000 mL | Freq: Four times a day (QID) | ORAL | Status: DC | PRN

## 2012-11-28 MED ORDER — INSULIN ASPART 100 UNIT/ML ~~LOC~~ SOLN: 0.0000 [IU] | Freq: Every day | SUBCUTANEOUS | Status: DC

## 2012-11-28 MED ORDER — IPRATROPIUM BROMIDE 0.02 % IN SOLN
0.5000 mg | RESPIRATORY_TRACT | Status: DC
  Administered 2012-11-28 – 2012-12-01 (×18): 0.5 mg via RESPIRATORY_TRACT
  Filled 2012-11-28 (×17): qty 2.5

## 2012-11-28 NOTE — Progress Notes (Signed)
Pt wears CPAP 13 by sleep study at home. Taken off sleep study Records.
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
## 2012-11-28 NOTE — ED Notes (Signed)
Patient reports improvement with BiPap. No complaints. Denies any needs at present.

## 2012-11-28 NOTE — Progress Notes (Signed)
Dr Sherrie Mustache paged and made aware that pt had a 22 beat run of V-tach. Pt complained of increased shortness of breath during episode. Pt HR returned back to 110's. BP 145/79. No complaints of chest pain. Will continue to monitor. EKG strip of episode placed in pts chart.

## 2012-11-28 NOTE — Progress Notes (Addendum)
Pt placed on BIPAP as he is still air trapping CPAP not used

## 2012-11-28 NOTE — ED Provider Notes (Signed)
CSN: [id number]     Arrival date & time 11/28/12  1120 History  This chart was scribed for Lyanne Co, MD by Allene Dillon, ED Scribe. This patient was seen in room APA01/APA01 and the patient's care was started at 11:43 PM.    Chief Complaint  Patient presents with  . Respiratory Distress    The history is provided by the patient. No language interpreter was used.   HPI Comments: [name] is a 57 y.o. male who presents to the Emergency Department complaining of gradually worsening SOB. Pt states SOB worsens with minimal movement. Pt states he had urinary retention which began two days ago, but is resolved now. Pt has a history of COPD, asthma, heart failure. Pt is currently on 2.5L oxygen at his residence. Pt states he weighed himself at 230 lbs, which is his baseline. There has been no significant weight gain.  The patient was seen and evaluated [facility] today where he received IM dexamethasone, IM Kenalog, 1 inch of nitro paste, 3 do an abscess in the office with some improvement in his symptoms.  By the time I arrived at the bedside the patient had developed recurrent severe shortness of breath with minimal exertion when he sat on the edge of the bed to urinate.  He reports worsening shortness of breath over the past several days with some productive cough at home as well as chills without documented fever.  Patient also has a history of diastolic heart failure.  He has a recent similar presentation in July of 2014 at which point he was admitted and treated with IV steroids and breathing treatment with improvement in his symptoms.  At that time his echocardiogram demonstrated normal LV function.  He has had a cardiac catheterization demonstrating normal coronary arteries.      Past Medical History  Diagnosis Date  . Hypertension   . Dyspnea   . Chronic kidney disease, stage 2, mildly decreased GFR     Creatinine of 1.31 in 04/2011  . COPD (chronic  obstructive pulmonary disease)     moderate by spirometry in 08/2009;FEV1 of 1.1 ;nl alpha -1 antitrypsin   . Obesity   . Psoriasis   . Left eye trauma     with loss of vision  . Erectile dysfunction   . Lumbosacral spinal stenosis     chronic low back pain  . Headache(784.0)   . Sleep apnea     O2 at night  . Carotid artery aneurysm   . Asthma   . Psoriasis    Past Surgical History  Procedure Laterality Date  . Orif patella  12/14/2005    left fracture Dr.Harrison   . Ophthalmologic surgery  1963    secondary to left eye trauma, as a child hit in eye with tree limb   . Colonoscopy N/A 05/06/2012    Procedure: COLONOSCOPY;  Surgeon: Corbin Ade, MD;  Location: [facility];  Service: Endoscopy;  Laterality: N/A;  1:15   Family History  Problem Relation Age of Onset  . Heart disease    . Arthritis    . Lung disease    . Cancer    . Asthma    . Colon cancer Brother     deceased at 62   . Brain cancer Brother    History  Substance Use Topics  . Smoking status: Former Smoker -- 1.00 packs/day for 20 years    Quit date: 06/10/2010  . Smokeless tobacco:  Never Used     Comment: Quit in 06/2010  . Alcohol Use: 0.5 oz/week    1 drink(s) per week     Comment: hx of ETOH abuse, none since hospitalization in December 2013     Review of Systems A complete 10 system review of systems was obtained and all systems are negative except as noted in the HPI and PMH.   Allergies  Bee venom  Home Medications   Current Outpatient Rx  Name  Route  Sig  Dispense  Refill  . ADVAIR DISKUS 250-50 MCG/DOSE AEPB   Inhalation   Inhale 1 puff into the lungs 2 (two) times daily.          Marland Kitchen albuterol (PROVENTIL HFA;VENTOLIN HFA) 108 (90 BASE) MCG/ACT inhaler   Inhalation   Inhale 2 puffs into the lungs as needed for wheezing (rescue inhaler).          . cloNIDine (CATAPRES) 0.2 MG tablet   Oral   Take 0.2 mg by mouth 3 (three) times daily.         . Fluticasone  Furoate-Vilanterol (BREO ELLIPTA) 100-25 MCG/INH AEPB   Inhalation   Inhale 1 puff into the lungs daily as needed (Uses when he doesn't have Advair Inhaler.).         Marland Kitchen furosemide (LASIX) 40 MG tablet   Oral   Take 1 tablet (40 mg total) by mouth daily.   30 tablet   5   . guaiFENesin (MUCINEX) 600 MG 12 hr tablet   Oral   Take 1,200 mg by mouth 2 (two) times daily.         Marland Kitchen HYDROcodone-acetaminophen (NORCO/VICODIN) 5-325 MG per tablet   Oral   Take 1 tablet by mouth every 6 (six) hours as needed for pain.   60 tablet   0   . lisinopril (PRINIVIL,ZESTRIL) 40 MG tablet   Oral   Take 40 mg by mouth daily.          . predniSONE (DELTASONE) 10 MG tablet   Oral   Take 10 mg by mouth daily.          . ranitidine (ZANTAC) 300 MG tablet   Oral   Take 300 mg by mouth at bedtime.          Triage Vitals: BP 153/77  Pulse 106  Wt 230 lb (104.327 kg)  BMI 33.95 kg/m2  SpO2 100% Physical Exam  Nursing note and vitals reviewed. Constitutional: He is oriented to person, place, and time. He appears well-developed and well-nourished.  HENT:  Head: Normocephalic and atraumatic.  Eyes: EOM are normal.  Neck: Normal range of motion.  Cardiovascular: Normal rate, regular rhythm, normal heart sounds and intact distal pulses.   Pulmonary/Chest: Effort normal and breath sounds normal. He has no wheezes.  Bilateral rhonchi. Decreased breath sounds throughout.   Abdominal: Soft. He exhibits no distension. There is no tenderness.  Musculoskeletal: Normal range of motion. He exhibits edema.  1+ edema bilaterally  Neurological: He is alert and oriented to person, place, and time.  Skin: Skin is warm and dry.  Psychiatric: He has a normal mood and affect. Judgment normal.    ED Course  Procedures (including critical care time)  CRITICAL CARE Performed by: Lyanne Co Total critical care time: 35 Critical care time was exclusive of separately billable procedures and  treating other patients. Critical care was necessary to treat or prevent imminent or life-threatening deterioration. Critical care was time spent personally by  me on the following activities: development of treatment plan with patient and/or surrogate as well as nursing, discussions with consultants, evaluation of patient's response to treatment, examination of patient, obtaining history from patient or surrogate, ordering and performing treatments and interventions, ordering and review of laboratory studies, ordering and review of radiographic studies, pulse oximetry and re-evaluation of patient's condition.   ECG interpretation   Date: 11/28/2012  Rate: 104  Rhythm: normal sinus rhythm  QRS Axis: normal  Intervals: normal  ST/T Wave abnormalities: non specific lateral ST changes  Conduction Disutrbances: none  Narrative Interpretation: PVC  Old EKG Reviewed: No significant changes noted     DIAGNOSTIC STUDIES: Oxygen Saturation is 100% on [place], normal by my interpretation.    COORDINATION OF CARE: 11:45 AM- Putting pt on BiPAP. Pt advised of plan for treatment and pt agrees.   Labs Review Labs Reviewed  CBC WITH DIFFERENTIAL - Abnormal; Notable for the following:    RBC 4.16 (*)    Hemoglobin 12.7 (*)    HCT 38.7 (*)    All other components within normal limits  BASIC METABOLIC PANEL - Abnormal; Notable for the following:    Glucose, Bld 106 (*)    GFR calc non Af Amer 63 (*)    GFR calc Af Amer 73 (*)    All other components within normal limits  PRO B NATRIURETIC PEPTIDE - Abnormal; Notable for the following:    Pro B Natriuretic peptide (BNP) 1277.0 (*)    All other components within normal limits  TROPONIN I  BLOOD GAS, ARTERIAL   Imaging Review Dg Chest Portable 1 View  11/28/2012   CLINICAL DATA:  Short of breath  EXAM: PORTABLE CHEST - 1 VIEW  COMPARISON:  11/28/2012.  FINDINGS: The lungs appear hyperinflated. There are mildly coarsened interstitial markings  noted bilaterally. No pleural effusion or edema. No airspace consolidation.  IMPRESSION: 1. No acute finding   Electronically Signed   By: Signa Kell M.D.   On: 11/28/2012 12:11  I personally reviewed the imaging tests through PACS system I reviewed available ER/hospitalization records through the EMR   MDM   1. Acute-on-chronic respiratory failure   2. Diastolic heart failure   3. COPD with exacerbation    1:06 PM The patient's breathing seems to have improved on BiPAP.  This appears to be a presentation of COPD exacerbation.  The patient received some Decadron in the clinic however the admitting physician asked that I add some Solu-Medrol which I think is reasonable at this time.  Nitro paste has been removed and the patient's chest.  ABG pending at this time.  Doubt pulmonary embolism.  No evidence of pneumonia however given the COPD exacerbation these patients had shown to benefit from antibiotics and therefore he will receive a dose of IV Levaquin at this time.  The patient will be admitted to the intensive care unit   I personally performed the services described in this documentation, which was scribed in my presence. The recorded information has been reviewed and is accurate.      Lyanne Co, MD 11/28/12 1309

## 2012-11-28 NOTE — ED Notes (Signed)
Report given to Marcelino Duster, RN ICU. Awaiting respiratory to transport patient to ICU.

## 2012-11-28 NOTE — Progress Notes (Signed)
Pt off BIPAP< sat 100 on 3lpm/[place], hr 104 no dyspnea present.

## 2012-11-28 NOTE — ED Notes (Signed)
Patient arrives via EMS from [facility] medical center with c/o respiratory distress. Patient started with shortness of breath two days ago with progression in severity. [facility] gave patient 3 duo nebs in office along with dexamethazone IM, Kenalog IM, and 1 inch of nitro paste applied to chest. Patient alert/oriented x4. Arrives on CPAP.

## 2012-11-28 NOTE — Progress Notes (Signed)
Dr Sherrie Mustache paged. Pt becomes very short of breath with any movement and is having to urinate frequently due IV Lasix. Order received to place foley. 16 French Foley placed per protocol.

## 2012-11-29 ENCOUNTER — Inpatient Hospital Stay (HOSPITAL_COMMUNITY): Payer: Medicare HMO

## 2012-11-29 LAB — GLUCOSE, CAPILLARY
Glucose-Capillary: 147 mg/dL — ABNORMAL HIGH (ref 70–99)
Glucose-Capillary: 133 mg/dL — ABNORMAL HIGH (ref 70–99)
Glucose-Capillary: 118 mg/dL — ABNORMAL HIGH (ref 70–99)

## 2012-11-29 LAB — COMPREHENSIVE METABOLIC PANEL
AST: 16 U/L (ref 0–37)
Albumin: 3.4 g/dL — ABNORMAL LOW (ref 3.5–5.2)
Alkaline Phosphatase: 49 U/L (ref 39–117)
BUN: 15 mg/dL (ref 6–23)
CO2: 31 mEq/L (ref 19–32)
Calcium: 9.4 mg/dL (ref 8.4–10.5)
Chloride: 94 mEq/L — ABNORMAL LOW (ref 96–112)
GFR calc Af Amer: 66 mL/min — ABNORMAL LOW (ref >90)
Potassium: 4 mEq/L (ref 3.5–5.1)
Sodium: 136 mEq/L (ref 135–145)
Total Bilirubin: 0.4 mg/dL (ref 0.3–1.2)
Total Protein: 6.5 g/dL (ref 6.0–8.3)

## 2012-11-29 LAB — PROTIME-INR: Prothrombin Time: 16.3 seconds — ABNORMAL HIGH (ref 11.6–15.2)

## 2012-11-29 LAB — MAGNESIUM: Magnesium: 2.6 mg/dL — ABNORMAL HIGH (ref 1.5–2.5)

## 2012-11-29 LAB — TROPONIN I: Troponin I: <0.3 ng/mL (ref <0.30)

## 2012-11-29 NOTE — Progress Notes (Addendum)
PT on CPAP 13 home settings 30 % Fio2 , switched to nasal mask at 02:27 pt has both mask at home.

## 2012-11-29 NOTE — Progress Notes (Signed)
Pt did well slept till 03:13 on BIPAP, will leave off for now as he wanted to watch TV.

## 2012-11-29 NOTE — Progress Notes (Signed)
Subjective: He was admitted with respiratory distress. He says he feels much better. He seemed to be having a combination of CHF and COPD.  Objective: Vital signs in last 24 hours: Temp:  [98 F (36.7 C)-98.7 F (37.1 C)] 98.2 F (36.8 C) (09/21 0400) Pulse Rate:  [86-111] 102 (09/21 0800) Resp:  [14-30] 17 (09/21 0800) BP: (96-179)/(49-79) 113/63 mmHg (09/21 0800) SpO2:  [91 %-100 %] 95 % (09/21 0800) FiO2 (%):  [35 %] 35 % (09/20 1800) Weight:  [102.2 kg (225 lb 5 oz)-104.5 kg (230 lb 6.1 oz)] 104.5 kg (230 lb 6.1 oz) (09/21 0500) Weight change:  Last BM Date: 11/27/12  Intake/Output from previous day: 09/20 0701 - 09/21 0700 In: 1123 [P.O.:650; I.V.:123; IV Piggyback:350] Out: 1900 [Urine:1900]  PHYSICAL EXAM General appearance: alert, cooperative and mild distress Resp: rhonchi bilaterally Cardio: regular rate and rhythm, S1, S2 normal, no murmur, click, rub or gallop GI: soft, non-tender; bowel sounds normal; no masses,  no organomegaly Extremities: extremities normal, atraumatic, no cyanosis or edema  Lab Results:    Basic Metabolic Panel:  Recent Labs  40/98/11 1200 11/29/12 0321  NA 139 136  K 3.5 4.0  CL 98 94*  CO2 31 31  GLUCOSE 106* 163*  BUN 10 15  CREATININE 1.23 1.34  CALCIUM 9.5 9.4  MG  --  2.6*   Liver Function Tests:  Recent Labs  11/29/12 0321  AST 16  ALT 24  ALKPHOS 49  BILITOT 0.4  PROT 6.5  ALBUMIN 3.4*   No results found for this basename: LIPASE, AMYLASE,  in the last 72 hours No results found for this basename: AMMONIA,  in the last 72 hours CBC:  Recent Labs  11/28/12 1200  WBC 5.6  HGB 12.7*  HCT 38.7*  MCV 93.0  PLT 242   Cardiac Enzymes:  Recent Labs  11/28/12 1439 11/28/12 2041 11/29/12 0320  TROPONINI <0.30 <0.30 <0.30   BNP:  Recent Labs  11/28/12 1200  PROBNP 1277.0*   D-Dimer: No results found for this basename: DDIMER,  in the last 72 hours CBG:  Recent Labs  11/28/12 1636  11/28/12 2131 11/29/12 0739  GLUCAP 140* 149* 147*   Hemoglobin A1C: No results found for this basename: HGBA1C,  in the last 72 hours Fasting Lipid Panel: No results found for this basename: CHOL, HDL, LDLCALC, TRIG, CHOLHDL, LDLDIRECT,  in the last 72 hours Thyroid Function Tests:  Recent Labs  11/28/12 1200  TSH 0.459   Anemia Panel: No results found for this basename: VITAMINB12, FOLATE, FERRITIN, TIBC, IRON, RETICCTPCT,  in the last 72 hours Coagulation:  Recent Labs  11/29/12 0321  LABPROT 16.3*  INR 1.34   Urine Drug Screen: Drugs of Abuse     Component Value Date/Time   LABOPIA NONE DETECTED 03/01/2012 0019   COCAINSCRNUR POSITIVE* 03/01/2012 0019   LABBENZ NONE DETECTED 03/01/2012 0019   AMPHETMU NONE DETECTED 03/01/2012 0019   THCU NONE DETECTED 03/01/2012 0019   LABBARB NONE DETECTED 03/01/2012 0019    Alcohol Level: No results found for this basename: ETH,  in the last 72 hours Urinalysis: No results found for this basename: COLORURINE, APPERANCEUR, LABSPEC, PHURINE, GLUCOSEU, HGBUR, BILIRUBINUR, KETONESUR, PROTEINUR, UROBILINOGEN, NITRITE, LEUKOCYTESUR,  in the last 72 hours Misc. Labs:  ABGS  Recent Labs  11/28/12 1302  PHART 7.386  PO2ART 106.0*  TCO2 28.6  HCO3 31.7*   CULTURES Recent Results (from the past 240 hour(s))  MRSA PCR SCREENING  Status: None   Collection Time    11/28/12  3:50 PM      Result Value Range Status   MRSA by PCR NEGATIVE  NEGATIVE Final   Comment:            The GeneXpert MRSA Assay (FDA     approved for NASAL specimens     only), is one component of a     comprehensive MRSA colonization     surveillance program. It is not     intended to diagnose MRSA     infection nor to guide or     monitor treatment for     MRSA infections.   Studies/Results: Dg Chest Portable 1 View  11/28/2012   CLINICAL DATA:  Short of breath  EXAM: PORTABLE CHEST - 1 VIEW  COMPARISON:  11/28/2012.  FINDINGS: The lungs appear  hyperinflated. There are mildly coarsened interstitial markings noted bilaterally. No pleural effusion or edema. No airspace consolidation.  IMPRESSION: 1. No acute finding   Electronically Signed   By: Signa Kell M.D.   On: 11/28/2012 12:11    Medications:  Scheduled: . albuterol  2.5 mg Nebulization Q4H  . azithromycin  500 mg Intravenous Q24H  . cefTRIAXone (ROCEPHIN)  IV  1 g Intravenous Q24H  . cloNIDine  0.2 mg Oral TID  . docusate sodium  100 mg Oral BID  . enoxaparin (LOVENOX) injection  40 mg Subcutaneous Q24H  . famotidine  40 mg Oral QHS  . furosemide  60 mg Intravenous Q12H  . guaiFENesin  1,200 mg Oral BID  . insulin aspart  0-15 Units Subcutaneous TID WC  . insulin aspart  0-5 Units Subcutaneous QHS  . ipratropium  0.5 mg Nebulization Q4H  . lisinopril  40 mg Oral Daily  . methylPREDNISolone (SOLU-MEDROL) injection  80 mg Intravenous Q6H  . mometasone-formoterol  2 puff Inhalation BID  . pantoprazole (PROTONIX) IV  40 mg Intravenous Q24H  . sodium chloride  3 mL Intravenous Q12H   Continuous:  ZOX:WRUEAV chloride, acetaminophen, acetaminophen, alum & mag hydroxide-simeth, chlorpheniramine-HYDROcodone, morphine injection, ondansetron (ZOFRAN) IV, ondansetron, oxyCODONE, sodium chloride  Assesment: He was admitted with acute on chronic respiratory failure. He has acute on chronic diastolic CHF. He has COPD with exacerbation. He has improved with treatment including BiPAP Principal Problem:   Respiratory distress Active Problems:   Chronic kidney disease, stage 2, mildly decreased GFR   Diastolic CHF, acute on chronic   Acute-on-chronic respiratory failure   Obesity   Bilateral lower extremity edema   Obstructive sleep apnea   Hypertension    Plan: Continue current treatments he will stay in the ICU today and possibly be able to be transferred tomorrow    LOS: 1 day   Alyla Pietila L 11/29/2012, 9:03 AM

## 2012-11-30 DIAGNOSIS — J441 Chronic obstructive pulmonary disease with (acute) exacerbation: Secondary | ICD-10-CM

## 2012-11-30 DIAGNOSIS — I10 Essential (primary) hypertension: Secondary | ICD-10-CM

## 2012-11-30 DIAGNOSIS — I5033 Acute on chronic diastolic (congestive) heart failure: Principal | ICD-10-CM

## 2012-11-30 LAB — GLUCOSE, CAPILLARY
Glucose-Capillary: 127 mg/dL — ABNORMAL HIGH (ref 70–99)
Glucose-Capillary: 252 mg/dL — ABNORMAL HIGH (ref 70–99)
Glucose-Capillary: 115 mg/dL — ABNORMAL HIGH (ref 70–99)

## 2012-11-30 MED ORDER — PANTOPRAZOLE SODIUM 40 MG PO TBEC
40.0000 mg | DELAYED_RELEASE_TABLET | Freq: Every day | ORAL | Status: DC
  Administered 2012-11-30 – 2012-12-01 (×2): 40 mg via ORAL
  Filled 2012-11-30 (×2): qty 1

## 2012-11-30 MED ORDER — METHYLPREDNISOLONE SODIUM SUCC 40 MG IJ SOLR
40.0000 mg | Freq: Two times a day (BID) | INTRAMUSCULAR | Status: DC
  Administered 2012-11-30 – 2012-12-01 (×2): 40 mg via INTRAVENOUS
  Filled 2012-11-30 (×2): qty 1

## 2012-11-30 MED ORDER — AZITHROMYCIN 250 MG PO TABS
500.0000 mg | ORAL_TABLET | Freq: Every day | ORAL | Status: DC
  Administered 2012-11-30 – 2012-12-01 (×2): 500 mg via ORAL
  Filled 2012-11-30 (×2): qty 2

## 2012-11-30 NOTE — Progress Notes (Signed)
Pt off CPAP

## 2012-11-30 NOTE — Consult Note (Signed)
CARDIOLOGY CONSULT NOTE   Patient ID: SANJAY BROADFOOT MRN: [id number] DOB/AGE: 1955/11/07 57 y.o.  Admit Date: 11/28/2012 Referring Physician: Kari Baars MD Primary Physician: Fredirick Maudlin, MD Consulting Cardiologist: Purvis Sheffield, MD Primary Cardiologist: Formerly Dietrich Pates Reason for Consultation: CHF  Clinical Summary Mr. Milliman is a 57 y.o.male with CHF, dyspnea, with history of chronic diastolic CHF, oxygen COPD, obstructive sleep apnea on BiPAP and chronic kidney disease. The last 3 days the patient is noticed to increasing dyspnea on exertion, worsening to PND. The patient was in the process of moving from one house to another over the weekend, when he noted that his lower extremity are beginning to swell.  He moved from his other house the 2 mol in this patient, into a newer house without issues. Prior daily he notices breathing status had worsened, contacted Dr. Rejeana Brock primary care physician, who increased his Lasix from 40 mg daily to 80 mg, 2 weeks ago. Initially he had good diureses, the last 3 days symptoms. states that he did not miss any doses of medicine, but did eat some fried chicken.     He went to [place] [facility] and was treated with breathing treatment and steroids. Symptoms did not subside and he was therefore transferred to [facility]. Pro BNP was found be 1277, EKG revealed sinus tachycardia with T wave depression noted in the lateral leads and frequent PVCs. Troponin was found to be negative. He was al CHF.so negative for DVT. Chest x-ray was negative for CHF.he was treated with IV Lasix 60 mg x1, prednisone injection of 125 mg, started on levofloxacin, and albuterol treatments. His sense diuresis of approximately 2 L. Breathing status has improved. He denies chest pain, dizziness or near syncope.    Allergies  Allergen Reactions  . Bee Venom Anaphylaxis    Patient has an epi pen    Medications Scheduled Medications: . albuterol  2.5 mg  Nebulization Q4H  . azithromycin  500 mg Intravenous Q24H  . cefTRIAXone (ROCEPHIN)  IV  1 g Intravenous Q24H  . cloNIDine  0.2 mg Oral TID  . docusate sodium  100 mg Oral BID  . enoxaparin (LOVENOX) injection  40 mg Subcutaneous Q24H  . famotidine  40 mg Oral QHS  . furosemide  60 mg Intravenous Q12H  . guaiFENesin  1,200 mg Oral BID  . insulin aspart  0-15 Units Subcutaneous TID WC  . insulin aspart  0-5 Units Subcutaneous QHS  . ipratropium  0.5 mg Nebulization Q4H  . lisinopril  40 mg Oral Daily  . methylPREDNISolone (SOLU-MEDROL) injection  80 mg Intravenous Q6H  . mometasone-formoterol  2 puff Inhalation BID  . pantoprazole (PROTONIX) IV  40 mg Intravenous Q24H  . sodium chloride  3 mL Intravenous Q12H     Infusions:     PRN Medications:  sodium chloride, acetaminophen, acetaminophen, alum & mag hydroxide-simeth, chlorpheniramine-HYDROcodone, morphine injection, ondansetron (ZOFRAN) IV, ondansetron, oxyCODONE, sodium chloride   Past Medical History  Diagnosis Date  . Hypertension   . Dyspnea   . Chronic kidney disease, stage 2, mildly decreased GFR     Creatinine of 1.31 in 04/2011  . COPD (chronic obstructive pulmonary disease)     moderate by spirometry in 08/2009;FEV1 of 1.1 ;nl alpha -1 antitrypsin   . Obesity   . Psoriasis   . Left eye trauma     with loss of vision  . Erectile dysfunction   . Lumbosacral spinal stenosis     chronic low back pain  .  Headache(784.0)   . Sleep apnea     O2 at night  . Carotid artery aneurysm   . Asthma   . Psoriasis     Past Surgical History  Procedure Laterality Date  . Orif patella  12/14/2005    left fracture Dr.Harrison   . Ophthalmologic surgery  1963    secondary to left eye trauma, as a child hit in eye with tree limb   . Colonoscopy N/A 05/06/2012    Procedure: COLONOSCOPY;  Surgeon: Corbin Ade, MD;  Location: [facility];  Service: Endoscopy;  Laterality: N/A;  1:15    Family History  Problem Relation  Age of Onset  . Heart disease    . Arthritis    . Lung disease    . Cancer    . Asthma    . Colon cancer Brother     deceased at 67   . Brain cancer Brother     Social History Mr. Fullington reports that he quit smoking about 2 years ago. He has never used smokeless tobacco. Mr. Noone reports that he drinks about 0.5 ounces of alcohol per week.  Review of Systems Otherwise reviewed and negative except as outlined.  Physical Examination Blood pressure 119/65, pulse 119, temperature 98.3 F (36.8 C), temperature source Oral, resp. rate 20, height 5\' 9"  (1.753 m), weight 229 lb 0.9 oz (103.9 kg), SpO2 86.00%.  Intake/Output Summary (Last 24 hours) at 11/30/12 0915 Last data filed at 11/30/12 0800  Gross per 24 hour  Intake   1023 ml  Output   1850 ml  Net   -827 ml    Telemetry: SR/ST  100-113 bpm  HEENT: Conjunctiva and lids normal, oropharynx clear with moist mucosa. Neck: Supple, no elevated JVP or carotid bruits, no thyromegaly. Lungs: Diminished in the bases. Wearing 02.  Cardiac: Regular rate and rhythm, no S3 or significant systolic murmur, no pericardial rub. Abdomen: Soft, nontender, obese, no hepatomegaly, bowel sounds present, no guarding or rebound. Extremities: No pitting edema, distal pulses 2+. Skin: Warm and dry. Musculoskeletal: No kyphosis. Neuropsychiatric: Alert and oriented x3, affect grossly appropriate.  Prior Cardiac Testing/Procedures  1. Cardiac Cath 04/26/2010 IMPRESSION:  1. Relatively normal right heart pressures with mild increase in  systemic pressure.  2. Preserved overall left ventricular function.  3. No evidence of high-grade coronary artery obstruction.  4. Current right and left heart pressures that are within the normal  Range.  2. Echocardiogram 12/2012 Left ventricle: Technically difficult study, with poor visualization of endocardial borders and valvular structures. Moderate posterior wall and severe basal septal  hypertrophy. Systolic function was vigorous. The estimated ejection fraction was in the range of 65% to 70%. The study is not technically sufficient to allow evaluation of LV diastolic function. - Aortic valve: Aortic regurgitation was present, but cannot assess severity on the basis of this study. - Right ventricle: Hypertrophy is noted.     Lab Results  Basic Metabolic Panel:  Recent Labs Lab 11/28/12 1200 11/29/12 0321  NA 139 136  K 3.5 4.0  CL 98 94*  CO2 31 31  GLUCOSE 106* 163*  BUN 10 15  CREATININE 1.23 1.34  CALCIUM 9.5 9.4  MG  --  2.6*    Liver Function Tests:  Recent Labs Lab 11/29/12 0321  AST 16  ALT 24  ALKPHOS 49  BILITOT 0.4  PROT 6.5  ALBUMIN 3.4*    CBC:  Recent Labs Lab 11/28/12 1200  WBC 5.6  HGB 12.7*  HCT 38.7*  MCV 93.0  PLT 242    Cardiac Enzymes:  Recent Labs Lab 11/28/12 1200 11/28/12 1439 11/28/12 2041 11/29/12 0320  TROPONINI <0.30 <0.30 <0.30 <0.30   Pro- BNP: 1, 277.0  Radiology: US Venous Img Lower Bilateral  11/29/2012   CLINICAL DATA:  Bilateral leg edema, evaluate for DVT  EXAM: VENOUS DOPPLER ULTRASOUND OF BILATERAL LOWER EXTREMITIES  TECHNIQUE:   FINDINGS: Visualized bilateral lower extremity deep venous systems appear patent.  Normal compressibility. Patent color Doppler flow. Satisfactory spectral Doppler with respiratory variation and response to augmentation.  Greater saphenous vein, where visualized, is patent and compressible bilaterally.  Subcutaneous edema in the ankle.  IMPRESSION: No deep venous thrombosis in the visualized bilateral lower extremities.   Electronically Signed   By: Charline Bills M.D.   On: 11/29/2012 12:49   Dg Chest Portable 1 View  11/28/2012   CLINICAL DATA:  Short of breath  EXAM: PORTABLE CHEST - 1 VIEW  COMPARISON:  11/28/2012.  FINDINGS: The lungs appear hyperinflated. There are mildly coarsened interstitial markings noted bilaterally. No pleural effusion or edema. No  airspace consolidation.  IMPRESSION: 1. No acute finding   Electronically Signed   By: Signa Kell M.D.   On: 11/28/2012 12:11   ECG:NSR with PVC's with lateral T-wave depression and nonspecific inferior abnormalities. Lateral abnormalities are new compared to EKG in July of 2014.    Impression and Recommendations:   1. Acute on Chronic Diastolic CHF:  Sudden onset with associated PND. Now diuresed a little over a liter with improvement in symptoms. Most recent echo in 09/2012. Remains on lasix 60 mg IV Q 12 hours. Would leave on this for the remainder of the day and then transition to PO.   2. Abnormal EKG: Lateral EKG depression on admission, with normal troponin.  Cath in 2012, demonstrated the coronary arteries were large and patulous, LAD was free of critical disease at that time, (he may have had 30% mild plaquing in the distal LAD) , but given the large caliber of the vessel, it was not felt to be hemodynamically significant. The AV circumflex was small but supplied a small posterior lateral territory, the right coronary artery was a giant coronary vessel supplying a posterior descending and posterior/lateral system, which were without significant focal obstruction.     Would not plan for any ischemic testing at this time.   3. COPD: Continues on BiPAP at night There may be a component of right heart failure in conjunction with high pulmonary pressures associated in COPD.     Signed: Bettey Mare. Lyman Bishop NP [facility] 11/30/2012, 9:15 AM Co-Sign MD

## 2012-11-30 NOTE — Care Management Note (Signed)
    Page 1 of 2   12/01/2012     2:05:11 PM   CARE MANAGEMENT NOTE 12/01/2012  Patient:  [name], [name]   Account Number:  [id number]  Date Initiated:  11/30/2012  Documentation initiated by:  Sharrie Rothman  Subjective/Objective Assessment:   Pt admitted from home with CHF. Pt lives alone but has a friend that stays with him most of the time.Pt also has a CAP aide 3 hours a day, M-f and 2 hours on Saturday and Sunday. Pt has home O2 with [employer], cpap, walker, cane and neb     Action/Plan:   machine.Pt would benefit from [facility] RN and PT at discharge. Cm will arrange with pts choice of agency at discharge.   Anticipated DC Date:  12/02/2012   Anticipated DC Plan:  HOME W HOME HEALTH SERVICES      DC Planning Services  CM consult      [facility] Choice  HOME HEALTH   Choice offered to / List presented to:  C-1 Patient        HH arranged  HH-1 RN  HH-10 DISEASE MANAGEMENT  HH-2 PT      HH agency  [facility]   Status of service:  Completed, signed off Medicare Important Message given?  YES (If response is "NO", the following Medicare IM given date fields will be blank) Date Medicare IM given:  12/01/2012 Date Additional Medicare IM given:    Discharge Disposition:  HOME W HOME HEALTH SERVICES  Per UR Regulation:    If discussed at Long Length of Stay Meetings, dates discussed:    Comments:  12/01/12 1400 Arlyss Queen, RN BSN CM Pt discharged home today with [facility] RN. Alroy Bailiff of [facility] is aware and will collect pts information from the chart. HH services will start within 48 hours of discharge. No DME needs noted. Pt and pts nurse aware of discharge arrangements.  11/30/12 1345 Arlyss Queen, RN BSN CM

## 2012-11-30 NOTE — Progress Notes (Signed)
PHARMACIST - PHYSICIAN COMMUNICATION DR:   Juanetta Gosling CONCERNING: Antibiotic IV to Oral Route Change Policy  RECOMMENDATION: This patient is receiving Azithromycin by the intravenous route.  Based on criteria approved by the Pharmacy and Therapeutics Committee, the antibiotic(s) is/are being converted to the equivalent oral dose form(s).   DESCRIPTION: These criteria include:  Patient being treated for a respiratory tract infection, urinary tract infection, cellulitis or clostridium difficile associated diarrhea if on metronidazole  The patient is not neutropenic and does not exhibit a GI malabsorption state  The patient is eating (either orally or via tube) and/or has been taking other orally administered medications for a least 24 hours  The patient is improving clinically and has a Tmax < 100.5  If you have questions about this conversion, please contact the Pharmacy Department  [x]   [phone] )  [facility] []   [phone] )  [facility]  []   [phone] )  [facility] []   [phone] )  [facility]    The patient is receiving Protonix by the intravenous route.  Based on criteria approved by the Pharmacy and Therapeutics Committee and the Medical Executive Committee, the medication is being converted to the equivalent oral dose form.  These criteria include: -No Active GI bleeding -Able to tolerate diet of full liquids (or better) or tube feeding OR able to tolerate other medications by the oral or enteral route  If you have any questions about this conversion, please contact the Pharmacy Department (ext [phone]).  Thank you.  Elson Clan, [facility] 11/30/2012 9:18 AM

## 2012-11-30 NOTE — Progress Notes (Signed)
UR chart review completed.  

## 2012-11-30 NOTE — Consult Note (Signed)
The patient was seen and examined, and I agree with the assessment and plan as documented above. Pt has been compliant with diuretics, and has been doing well with a low-sodium diet. He ate one fried chicken wing and one packet of salty cheese crackers. He does have both COPD and sleep apnea, and remains compliant with CPAP. He has diuresed well and feels much better.  Given the lack of pulmonary edema, his leg swelling was likely secondary to transiently elevated right heart pressures from a combination of COPD and sleep apnea, in the setting of obesity-hypoventilation syndrome. His SOB is resolved, and he may have had a very mild diastolic decompensation, with the primary etiologies being acute bronchitis in the setting of COPD. While a diastolic assessment was not performed on his most recent echocardiogram, given the severe septal hypertrophy and moderate posterior wall hypertrophy, he most certainly has diastolic dysfunction and has a good probability of having elevated LVEDP. His BP is controlled. Can switch to oral diuretics on 12/01/12.

## 2012-11-30 NOTE — Progress Notes (Signed)
Subjective: He says he feels better. He has no new complaints. His breathing is much improved.  Objective: Vital signs in last 24 hours: Temp:  [97.1 F (36.2 C)-98.2 F (36.8 C)] 98 F (36.7 C) (09/22 0400) Pulse Rate:  [82-107] 87 (09/22 0600) Resp:  [11-23] 14 (09/22 0600) BP: (88-116)/(48-63) 95/57 mmHg (09/22 0600) SpO2:  [93 %-99 %] 96 % (09/22 0600) Weight:  [103.9 kg (229 lb 0.9 oz)] 103.9 kg (229 lb 0.9 oz) (09/22 0500) Weight change: -0.427 kg (-15.1 oz) Last BM Date: 11/27/12  Intake/Output from previous day: 09/21 0701 - 09/22 0700 In: 533 [I.V.:233; IV Piggyback:300] Out: 1850 [Urine:1850]  PHYSICAL EXAM General appearance: alert, cooperative and no distress Resp: clear to auscultation bilaterally Cardio: regular rate and rhythm, S1, S2 normal, no murmur, click, rub or gallop GI: soft, non-tender; bowel sounds normal; no masses,  no organomegaly Extremities: extremities normal, atraumatic, no cyanosis or edema  Lab Results:    Basic Metabolic Panel:  Recent Labs  16/10/96 1200 11/29/12 0321  NA 139 136  K 3.5 4.0  CL 98 94*  CO2 31 31  GLUCOSE 106* 163*  BUN 10 15  CREATININE 1.23 1.34  CALCIUM 9.5 9.4  MG  --  2.6*   Liver Function Tests:  Recent Labs  11/29/12 0321  AST 16  ALT 24  ALKPHOS 49  BILITOT 0.4  PROT 6.5  ALBUMIN 3.4*   No results found for this basename: LIPASE, AMYLASE,  in the last 72 hours No results found for this basename: AMMONIA,  in the last 72 hours CBC:  Recent Labs  11/28/12 1200  WBC 5.6  HGB 12.7*  HCT 38.7*  MCV 93.0  PLT 242   Cardiac Enzymes:  Recent Labs  11/28/12 1439 11/28/12 2041 11/29/12 0320  TROPONINI <0.30 <0.30 <0.30   BNP:  Recent Labs  11/28/12 1200  PROBNP 1277.0*   D-Dimer: No results found for this basename: DDIMER,  in the last 72 hours CBG:  Recent Labs  11/28/12 2131 11/29/12 0739 11/29/12 1242 11/29/12 1628 11/29/12 2149 11/30/12 0717  GLUCAP 149* 147*  141* 133* 118* 127*   Hemoglobin A1C: No results found for this basename: HGBA1C,  in the last 72 hours Fasting Lipid Panel: No results found for this basename: CHOL, HDL, LDLCALC, TRIG, CHOLHDL, LDLDIRECT,  in the last 72 hours Thyroid Function Tests:  Recent Labs  11/28/12 1200  TSH 0.459   Anemia Panel: No results found for this basename: VITAMINB12, FOLATE, FERRITIN, TIBC, IRON, RETICCTPCT,  in the last 72 hours Coagulation:  Recent Labs  11/29/12 0321  LABPROT 16.3*  INR 1.34   Urine Drug Screen: Drugs of Abuse     Component Value Date/Time   LABOPIA NONE DETECTED 03/01/2012 0019   COCAINSCRNUR POSITIVE* 03/01/2012 0019   LABBENZ NONE DETECTED 03/01/2012 0019   AMPHETMU NONE DETECTED 03/01/2012 0019   THCU NONE DETECTED 03/01/2012 0019   LABBARB NONE DETECTED 03/01/2012 0019    Alcohol Level: No results found for this basename: ETH,  in the last 72 hours Urinalysis: No results found for this basename: COLORURINE, APPERANCEUR, LABSPEC, PHURINE, GLUCOSEU, HGBUR, BILIRUBINUR, KETONESUR, PROTEINUR, UROBILINOGEN, NITRITE, LEUKOCYTESUR,  in the last 72 hours Misc. Labs:  ABGS  Recent Labs  11/28/12 1302  PHART 7.386  PO2ART 106.0*  TCO2 28.6  HCO3 31.7*   CULTURES Recent Results (from the past 240 hour(s))  MRSA PCR SCREENING     Status: None   Collection Time  11/28/12  3:50 PM      Result Value Range Status   MRSA by PCR NEGATIVE  NEGATIVE Final   Comment:            The GeneXpert MRSA Assay (FDA     approved for NASAL specimens     only), is one component of a     comprehensive MRSA colonization     surveillance program. It is not     intended to diagnose MRSA     infection nor to guide or     monitor treatment for     MRSA infections.   Studies/Results: US Venous Img Lower Bilateral  11/29/2012   CLINICAL DATA:  Bilateral leg edema, evaluate for DVT  EXAM: VENOUS DOPPLER ULTRASOUND OF BILATERAL LOWER EXTREMITIES  TECHNIQUE: Gray-scale  sonography with graded compression, as well as color Doppler and duplex ultrasound, were performed to evaluate the deep venous system from the level of the common femoral vein through the popliteal and proximal calf veins. Spectral Doppler was utilized to evaluate flow at rest and with distal augmentation maneuvers.  COMPARISON:  None.  FINDINGS: Visualized bilateral lower extremity deep venous systems appear patent.  Normal compressibility. Patent color Doppler flow. Satisfactory spectral Doppler with respiratory variation and response to augmentation.  Greater saphenous vein, where visualized, is patent and compressible bilaterally.  Subcutaneous edema in the ankle.  IMPRESSION: No deep venous thrombosis in the visualized bilateral lower extremities.   Electronically Signed   By: Charline Bills M.D.   On: 11/29/2012 12:49   Dg Chest Portable 1 View  11/28/2012   CLINICAL DATA:  Short of breath  EXAM: PORTABLE CHEST - 1 VIEW  COMPARISON:  11/28/2012.  FINDINGS: The lungs appear hyperinflated. There are mildly coarsened interstitial markings noted bilaterally. No pleural effusion or edema. No airspace consolidation.  IMPRESSION: 1. No acute finding   Electronically Signed   By: Signa Kell M.D.   On: 11/28/2012 12:11    Medications:  Prior to Admission:  Prescriptions prior to admission  Medication Sig Dispense Refill  . ADVAIR DISKUS 250-50 MCG/DOSE AEPB Inhale 1 puff into the lungs 2 (two) times daily.       Marland Kitchen albuterol (PROVENTIL HFA;VENTOLIN HFA) 108 (90 BASE) MCG/ACT inhaler Inhale 2 puffs into the lungs as needed for wheezing (rescue inhaler).       . cloNIDine (CATAPRES) 0.2 MG tablet Take 0.2 mg by mouth 3 (three) times daily.      . Fluticasone Furoate-Vilanterol (BREO ELLIPTA) 100-25 MCG/INH AEPB Inhale 1 puff into the lungs daily as needed (Uses when he doesn't have Advair Inhaler.).      Marland Kitchen furosemide (LASIX) 40 MG tablet Take 1 tablet (40 mg total) by mouth daily.  30 tablet  5  .  guaiFENesin (MUCINEX) 600 MG 12 hr tablet Take 1,200 mg by mouth 2 (two) times daily.      Marland Kitchen HYDROcodone-acetaminophen (NORCO/VICODIN) 5-325 MG per tablet Take 1 tablet by mouth every 6 (six) hours as needed for pain.  60 tablet  0  . lisinopril (PRINIVIL,ZESTRIL) 40 MG tablet Take 40 mg by mouth daily.       . predniSONE (DELTASONE) 10 MG tablet Take 10 mg by mouth daily.       . ranitidine (ZANTAC) 300 MG tablet Take 300 mg by mouth at bedtime.       Scheduled: . albuterol  2.5 mg Nebulization Q4H  . azithromycin  500 mg Intravenous Q24H  . cefTRIAXone (ROCEPHIN)  IV  1 g Intravenous Q24H  . cloNIDine  0.2 mg Oral TID  . docusate sodium  100 mg Oral BID  . enoxaparin (LOVENOX) injection  40 mg Subcutaneous Q24H  . famotidine  40 mg Oral QHS  . furosemide  60 mg Intravenous Q12H  . guaiFENesin  1,200 mg Oral BID  . insulin aspart  0-15 Units Subcutaneous TID WC  . insulin aspart  0-5 Units Subcutaneous QHS  . ipratropium  0.5 mg Nebulization Q4H  . lisinopril  40 mg Oral Daily  . methylPREDNISolone (SOLU-MEDROL) injection  80 mg Intravenous Q6H  . mometasone-formoterol  2 puff Inhalation BID  . pantoprazole (PROTONIX) IV  40 mg Intravenous Q24H  . sodium chloride  3 mL Intravenous Q12H   Continuous:  NWG:NFAOZH chloride, acetaminophen, acetaminophen, alum & mag hydroxide-simeth, chlorpheniramine-HYDROcodone, morphine injection, ondansetron (ZOFRAN) IV, ondansetron, oxyCODONE, sodium chloride  Assesment: He was admitted with respiratory distress and acute on chronic respiratory failure. I think this is a combination of acute on chronic diastolic CHF obstructive sleep apnea and COPD with exacerbation. He is better. Principal Problem:   Respiratory distress Active Problems:   Chronic kidney disease, stage 2, mildly decreased GFR   Diastolic CHF, acute on chronic   Acute-on-chronic respiratory failure   Obesity   Bilateral lower extremity edema   Obstructive sleep apnea    Hypertension    Plan: I will move him from the step down unit. I will request cardiology consultation.    LOS: 2 days   Jerrine Urschel L 11/30/2012, 7:57 AM

## 2012-11-30 NOTE — Progress Notes (Signed)
Report called to Billy Coast, RN. Patient ready for transfer to room 315.  No acute distress noted.  Foley catheter removed and IC heplocked.  Aromatherapy cream applied to patients left knee for relief of pain in knee.

## 2012-12-01 LAB — GLUCOSE, CAPILLARY

## 2012-12-01 MED ORDER — AZITHROMYCIN 250 MG PO TABS: ORAL_TABLET | ORAL | Status: DC

## 2012-12-01 MED ORDER — FUROSEMIDE 40 MG PO TABS
40.0000 mg | ORAL_TABLET | Freq: Two times a day (BID) | ORAL | Status: DC
Start: 2012-12-01 — End: 2012-12-01
  Administered 2012-12-01: 40 mg via ORAL
  Filled 2012-12-01: qty 1

## 2012-12-01 MED ORDER — METHYLPREDNISOLONE (PAK) 4 MG PO TABS: ORAL_TABLET | ORAL | Status: DC

## 2012-12-01 MED ORDER — FUROSEMIDE 40 MG PO TABS: 40.0000 mg | ORAL_TABLET | Freq: Two times a day (BID) | ORAL | Status: AC

## 2012-12-01 MED ORDER — CEPHALEXIN 500 MG PO CAPS: 500.0000 mg | ORAL_CAPSULE | Freq: Three times a day (TID) | ORAL | Status: DC

## 2012-12-01 NOTE — Progress Notes (Signed)
The patient was seen and examined, and I agree with the assessment and plan as documented above. Pt is symptomatically stable and ready for discharge. Switch to oral diuretics.

## 2012-12-01 NOTE — Progress Notes (Signed)
Pt refused Bipap at 2:00am. Pts sats were 100% at the time of refusal. Rechecked sats at 0400 and pt still saturating at 99-100%, pt stable with unlabored breathing. Will continue to monitor.

## 2012-12-01 NOTE — Progress Notes (Signed)
Patient with orders to be discharge home. Discharge instructions given, patient verbalized understanding. Patient in stable condition. Patient left with family in private vehicle.

## 2012-12-01 NOTE — Progress Notes (Signed)
[facility] ROUNDING NOTE  Consulting cardiologist: Purvis Sheffield, MD  Subjective:    Feeling and breathing better.Wants to go home.  Objective:   Temp:  [98.1 F (36.7 C)-98.3 F (36.8 C)] 98.3 F (36.8 C) (09/23 0621) Pulse Rate:  [87-109] 93 (09/23 0621) Resp:  [18-20] 20 (09/23 0621) BP: (87-108)/(45-66) 101/59 mmHg (09/23 0621) SpO2:  [95 %-98 %] 98 % (09/23 0725) FiO2 (%):  [28 %] 28 % (09/23 0028) Weight:  [227 lb 4.7 oz (103.1 kg)] 227 lb 4.7 oz (103.1 kg) (09/23 0325) Last BM Date: 12/01/12  Filed Weights   11/29/12 0500 11/30/12 0500 12/01/12 0325  Weight: 230 lb 6.1 oz (104.5 kg) 229 lb 0.9 oz (103.9 kg) 227 lb 4.7 oz (103.1 kg)    Intake/Output Summary (Last 24 hours) at 12/01/12 1045 Last data filed at 12/01/12 0857  Gross per 24 hour  Intake   1100 ml  Output   3000 ml  Net  -1900 ml    Telemetry: NSR 80's.  Exam:  General: No acute distress.  HEENT: Conjunctiva and lids normal, oropharynx clear.  Lungs: Diminished in the bases, no wheezes.  Cardiac: No elevated JVP or bruits. RRR, no gallop or rub.   Abdomen: Normoactive bowel sounds, obese, non-distended, nontender, nondistended.  Extremities: No pitting edema, distal pulses full.  Neuropsychiatric: Alert and oriented x3, affect appropriate.   Lab Results:  Basic Metabolic Panel:  Recent Labs Lab 11/28/12 1200 11/29/12 0321  NA 139 136  K 3.5 4.0  CL 98 94*  CO2 31 31  GLUCOSE 106* 163*  BUN 10 15  CREATININE 1.23 1.34  CALCIUM 9.5 9.4  MG  --  2.6*    Liver Function Tests:  Recent Labs Lab 11/29/12 0321  AST 16  ALT 24  ALKPHOS 49  BILITOT 0.4  PROT 6.5  ALBUMIN 3.4*    CBC:  Recent Labs Lab 11/28/12 1200  WBC 5.6  HGB 12.7*  HCT 38.7*  MCV 93.0  PLT 242    Cardiac Enzymes:  Recent Labs Lab 11/28/12 1439 11/28/12 2041 11/29/12 0320  TROPONINI <0.30 <0.30 <0.30    BNP:  Recent Labs  07/20/12 0936 10/05/12 1141 11/28/12 1200  PROBNP  3060.0* 768.6* 1277.0*    Coagulation:  Recent Labs Lab 11/29/12 0321  INR 1.34    Radiology: US Venous Img Lower Bilateral  11/29/2012   CLINICAL DATA:  Bilateral leg edema, evaluate for DVT  EXAM: VENOUS DOPPLER ULTRASOUND OF BILATERAL LOWER EXTREMITIES  TECHNIQUE: Gray-scale sonography with graded compression, as well as color Doppler and duplex ultrasound, were performed to evaluate the deep venous system from the level of the common femoral vein through the popliteal and proximal calf veins. Spectral Doppler was utilized to evaluate flow at rest and with distal augmentation maneuvers.  COMPARISON:  None.  FINDINGS: Visualized bilateral lower extremity deep venous systems appear patent.  Normal compressibility. Patent color Doppler flow. Satisfactory spectral Doppler with respiratory variation and response to augmentation.  Greater saphenous vein, where visualized, is patent and compressible bilaterally.  Subcutaneous edema in the ankle.  IMPRESSION: No deep venous thrombosis in the visualized bilateral lower extremities.   Electronically Signed   By: Charline Bills M.D.   On: 11/29/2012 12:49      Medications:   Scheduled Medications: . albuterol  2.5 mg Nebulization Q4H  . azithromycin  500 mg Oral Daily  . cefTRIAXone (ROCEPHIN)  IV  1 g Intravenous Q24H  . cloNIDine  0.2 mg Oral TID  .  docusate sodium  100 mg Oral BID  . enoxaparin (LOVENOX) injection  40 mg Subcutaneous Q24H  . famotidine  40 mg Oral QHS  . furosemide  40 mg Oral BID  . guaiFENesin  1,200 mg Oral BID  . insulin aspart  0-15 Units Subcutaneous TID WC  . insulin aspart  0-5 Units Subcutaneous QHS  . ipratropium  0.5 mg Nebulization Q4H  . lisinopril  40 mg Oral Daily  . methylPREDNISolone (SOLU-MEDROL) injection  40 mg Intravenous Q12H  . mometasone-formoterol  2 puff Inhalation BID  . pantoprazole  40 mg Oral Daily  . sodium chloride  3 mL Intravenous Q12H          PRN Medications:  sodium  chloride, acetaminophen, acetaminophen, alum & mag hydroxide-simeth, chlorpheniramine-HYDROcodone, morphine injection, ondansetron (ZOFRAN) IV, ondansetron, oxyCODONE, sodium chloride   Assessment and Plan:   1. Acute on Chronic Diastolic CHF: Feeling better, has lost 3 lbs from highest recorded,  changed to PO lasix 40 mg BID. He is advised on low sodium diet. He verbalizes understanding. Will need follow up appt. Our office will call for appt.   2. COPD: Continues on CPAP at HS. To continue follow up with Dr. Juanetta Gosling.  Bettey Mare. Lyman Bishop NP [facility] 12/01/2012, 10:45 AM

## 2012-12-01 NOTE — Progress Notes (Signed)
Subjective: He feels much better. He has no new complaints. He says he is breathing well. They help from cardiology is noted and appreciated  Objective: Vital signs in last 24 hours: Temp:  [98.1 F (36.7 C)-98.3 F (36.8 C)] 98.3 F (36.8 C) (09/23 0621) Pulse Rate:  [87-109] 93 (09/23 0621) Resp:  [15-20] 20 (09/23 0621) BP: (86-109)/(45-66) 101/59 mmHg (09/23 0621) SpO2:  [94 %-98 %] 98 % (09/23 0725) FiO2 (%):  [28 %] 28 % (09/23 0028) Weight:  [103.1 kg (227 lb 4.7 oz)] 103.1 kg (227 lb 4.7 oz) (09/23 0325) Weight change: -0.8 kg (-1 lb 12.2 oz) Last BM Date: 11/27/12  Intake/Output from previous day: 09/22 0701 - 09/23 0700 In: 1620 [P.O.:1560; I.V.:60] Out: 2550 [Urine:2550]  PHYSICAL EXAM General appearance: alert, cooperative and no distress Resp: clear to auscultation bilaterally Cardio: regular rate and rhythm, S1, S2 normal, no murmur, click, rub or gallop GI: soft, non-tender; bowel sounds normal; no masses,  no organomegaly Extremities: extremities normal, atraumatic, no cyanosis or edema  Lab Results:    Basic Metabolic Panel:  Recent Labs  08/65/78 1200 11/29/12 0321  NA 139 136  K 3.5 4.0  CL 98 94*  CO2 31 31  GLUCOSE 106* 163*  BUN 10 15  CREATININE 1.23 1.34  CALCIUM 9.5 9.4  MG  --  2.6*   Liver Function Tests:  Recent Labs  11/29/12 0321  AST 16  ALT 24  ALKPHOS 49  BILITOT 0.4  PROT 6.5  ALBUMIN 3.4*   No results found for this basename: LIPASE, AMYLASE,  in the last 72 hours No results found for this basename: AMMONIA,  in the last 72 hours CBC:  Recent Labs  11/28/12 1200  WBC 5.6  HGB 12.7*  HCT 38.7*  MCV 93.0  PLT 242   Cardiac Enzymes:  Recent Labs  11/28/12 1439 11/28/12 2041 11/29/12 0320  TROPONINI <0.30 <0.30 <0.30   BNP:  Recent Labs  11/28/12 1200  PROBNP 1277.0*   D-Dimer: No results found for this basename: DDIMER,  in the last 72 hours CBG:  Recent Labs  11/29/12 2149 11/30/12 0717  11/30/12 1241 11/30/12 1628 11/30/12 2042 12/01/12 0734  GLUCAP 118* 127* 252* 130* 115* 112*   Hemoglobin A1C: No results found for this basename: HGBA1C,  in the last 72 hours Fasting Lipid Panel: No results found for this basename: CHOL, HDL, LDLCALC, TRIG, CHOLHDL, LDLDIRECT,  in the last 72 hours Thyroid Function Tests:  Recent Labs  11/28/12 1200  TSH 0.459   Anemia Panel: No results found for this basename: VITAMINB12, FOLATE, FERRITIN, TIBC, IRON, RETICCTPCT,  in the last 72 hours Coagulation:  Recent Labs  11/29/12 0321  LABPROT 16.3*  INR 1.34   Urine Drug Screen: Drugs of Abuse     Component Value Date/Time   LABOPIA NONE DETECTED 03/01/2012 0019   COCAINSCRNUR POSITIVE* 03/01/2012 0019   LABBENZ NONE DETECTED 03/01/2012 0019   AMPHETMU NONE DETECTED 03/01/2012 0019   THCU NONE DETECTED 03/01/2012 0019   LABBARB NONE DETECTED 03/01/2012 0019    Alcohol Level: No results found for this basename: ETH,  in the last 72 hours Urinalysis: No results found for this basename: COLORURINE, APPERANCEUR, LABSPEC, PHURINE, GLUCOSEU, HGBUR, BILIRUBINUR, KETONESUR, PROTEINUR, UROBILINOGEN, NITRITE, LEUKOCYTESUR,  in the last 72 hours Misc. Labs:  ABGS  Recent Labs  11/28/12 1302  PHART 7.386  PO2ART 106.0*  TCO2 28.6  HCO3 31.7*   CULTURES Recent Results (from the past 240  hour(s))  MRSA PCR SCREENING     Status: None   Collection Time    11/28/12  3:50 PM      Result Value Range Status   MRSA by PCR NEGATIVE  NEGATIVE Final   Comment:            The GeneXpert MRSA Assay (FDA     approved for NASAL specimens     only), is one component of a     comprehensive MRSA colonization     surveillance program. It is not     intended to diagnose MRSA     infection nor to guide or     monitor treatment for     MRSA infections.   Studies/Results: US Venous Img Lower Bilateral  11/29/2012   CLINICAL DATA:  Bilateral leg edema, evaluate for DVT  EXAM: VENOUS  DOPPLER ULTRASOUND OF BILATERAL LOWER EXTREMITIES  TECHNIQUE: Gray-scale sonography with graded compression, as well as color Doppler and duplex ultrasound, were performed to evaluate the deep venous system from the level of the common femoral vein through the popliteal and proximal calf veins. Spectral Doppler was utilized to evaluate flow at rest and with distal augmentation maneuvers.  COMPARISON:  None.  FINDINGS: Visualized bilateral lower extremity deep venous systems appear patent.  Normal compressibility. Patent color Doppler flow. Satisfactory spectral Doppler with respiratory variation and response to augmentation.  Greater saphenous vein, where visualized, is patent and compressible bilaterally.  Subcutaneous edema in the ankle.  IMPRESSION: No deep venous thrombosis in the visualized bilateral lower extremities.   Electronically Signed   By: Charline Bills M.D.   On: 11/29/2012 12:49    Medications:  Prior to Admission:  Prescriptions prior to admission  Medication Sig Dispense Refill  . ADVAIR DISKUS 250-50 MCG/DOSE AEPB Inhale 1 puff into the lungs 2 (two) times daily.       Marland Kitchen albuterol (PROVENTIL HFA;VENTOLIN HFA) 108 (90 BASE) MCG/ACT inhaler Inhale 2 puffs into the lungs as needed for wheezing (rescue inhaler).       . cloNIDine (CATAPRES) 0.2 MG tablet Take 0.2 mg by mouth 3 (three) times daily.      . Fluticasone Furoate-Vilanterol (BREO ELLIPTA) 100-25 MCG/INH AEPB Inhale 1 puff into the lungs daily as needed (Uses when he doesn't have Advair Inhaler.).      Marland Kitchen furosemide (LASIX) 40 MG tablet Take 1 tablet (40 mg total) by mouth daily.  30 tablet  5  . guaiFENesin (MUCINEX) 600 MG 12 hr tablet Take 1,200 mg by mouth 2 (two) times daily.      Marland Kitchen HYDROcodone-acetaminophen (NORCO/VICODIN) 5-325 MG per tablet Take 1 tablet by mouth every 6 (six) hours as needed for pain.  60 tablet  0  . lisinopril (PRINIVIL,ZESTRIL) 40 MG tablet Take 40 mg by mouth daily.       . predniSONE  (DELTASONE) 10 MG tablet Take 10 mg by mouth daily.       . ranitidine (ZANTAC) 300 MG tablet Take 300 mg by mouth at bedtime.       Scheduled: . albuterol  2.5 mg Nebulization Q4H  . azithromycin  500 mg Oral Daily  . cefTRIAXone (ROCEPHIN)  IV  1 g Intravenous Q24H  . cloNIDine  0.2 mg Oral TID  . docusate sodium  100 mg Oral BID  . enoxaparin (LOVENOX) injection  40 mg Subcutaneous Q24H  . famotidine  40 mg Oral QHS  . furosemide  60 mg Intravenous Q12H  . guaiFENesin  1,200 mg Oral BID  . insulin aspart  0-15 Units Subcutaneous TID WC  . insulin aspart  0-5 Units Subcutaneous QHS  . ipratropium  0.5 mg Nebulization Q4H  . lisinopril  40 mg Oral Daily  . methylPREDNISolone (SOLU-MEDROL) injection  40 mg Intravenous Q12H  . mometasone-formoterol  2 puff Inhalation BID  . pantoprazole  40 mg Oral Daily  . sodium chloride  3 mL Intravenous Q12H   Continuous:  WUJ:WJXBJY chloride, acetaminophen, acetaminophen, alum & mag hydroxide-simeth, chlorpheniramine-HYDROcodone, morphine injection, ondansetron (ZOFRAN) IV, ondansetron, oxyCODONE, sodium chloride  Assesment: He was admitted with acute on chronic respiratory failure and probably acute exacerbation of COPD and CHF. He is much improved. Principal Problem:   Respiratory distress Active Problems:   CHRONIC OBSTRUCTIVE PULMONARY DISEASE   Chronic kidney disease, stage 2, mildly decreased GFR   COPD exacerbation   Diastolic CHF, acute on chronic   Acute-on-chronic respiratory failure   Obesity   Bilateral lower extremity edema   Obstructive sleep apnea   Hypertension    Plan: Switch to oral diuretics and he may be able to go home later today or in the morning    LOS: 3 days   Navada Osterhout L 12/01/2012, 8:49 AM

## 2012-12-03 NOTE — Discharge Summary (Signed)
Physician Discharge Summary  Patient ID: [name] MRN: [id number] DOB/AGE: [birth year] 57 y.o. Primary Care Physician:Francesco Provencal L, MD Admit date: 11/28/2012 Discharge date: 12/03/2012    Discharge Diagnoses:   Principal Problem:   Respiratory distress Active Problems:   CHRONIC OBSTRUCTIVE PULMONARY DISEASE   Chronic kidney disease, stage 2, mildly decreased GFR   COPD exacerbation   Diastolic CHF, acute on chronic   Acute-on-chronic respiratory failure   Obesity   Bilateral lower extremity edema   Obstructive sleep apnea   Hypertension     Medication List         ADVAIR DISKUS 250-50 MCG/DOSE Aepb  Generic drug:  Fluticasone-Salmeterol  Inhale 1 puff into the lungs 2 (two) times daily.     albuterol 108 (90 BASE) MCG/ACT inhaler  Commonly known as:  PROVENTIL HFA;VENTOLIN HFA  Inhale 2 puffs into the lungs as needed for wheezing (rescue inhaler).     azithromycin 250 MG tablet  Commonly known as:  ZITHROMAX  Take one daily     BREO ELLIPTA 100-25 MCG/INH Aepb  Generic drug:  Fluticasone Furoate-Vilanterol  Inhale 1 puff into the lungs daily as needed (Uses when he doesn't have Advair Inhaler.).     cephALEXin 500 MG capsule  Commonly known as:  KEFLEX  Take 1 capsule (500 mg total) by mouth 3 (three) times daily.     cloNIDine 0.2 MG tablet  Commonly known as:  CATAPRES  Take 0.2 mg by mouth 3 (three) times daily.     furosemide 40 MG tablet  Commonly known as:  LASIX  Take 1 tablet (40 mg total) by mouth 2 (two) times daily.     guaiFENesin 600 MG 12 hr tablet  Commonly known as:  MUCINEX  Take 1,200 mg by mouth 2 (two) times daily.     HYDROcodone-acetaminophen 5-325 MG per tablet  Commonly known as:  NORCO/VICODIN  Take 1 tablet by mouth every 6 (six) hours as needed for pain.     lisinopril 40 MG tablet  Commonly known as:  PRINIVIL,ZESTRIL  Take 40 mg by mouth daily.     methylPREDNIsolone 4 MG tablet  Commonly known as:  MEDROL  DOSPACK  follow package directions     predniSONE 10 MG tablet  Commonly known as:  DELTASONE  Take 10 mg by mouth daily.     ranitidine 300 MG tablet  Commonly known as:  ZANTAC  Take 300 mg by mouth at bedtime.        Discharged Condition: Improved    Consults: Cardiology  Significant Diagnostic Studies: US Venous Img Lower Bilateral  11/29/2012   CLINICAL DATA:  Bilateral leg edema, evaluate for DVT  EXAM: VENOUS DOPPLER ULTRASOUND OF BILATERAL LOWER EXTREMITIES  TECHNIQUE: Gray-scale sonography with graded compression, as well as color Doppler and duplex ultrasound, were performed to evaluate the deep venous system from the level of the common femoral vein through the popliteal and proximal calf veins. Spectral Doppler was utilized to evaluate flow at rest and with distal augmentation maneuvers.  COMPARISON:  None.  FINDINGS: Visualized bilateral lower extremity deep venous systems appear patent.  Normal compressibility. Patent color Doppler flow. Satisfactory spectral Doppler with respiratory variation and response to augmentation.  Greater saphenous vein, where visualized, is patent and compressible bilaterally.  Subcutaneous edema in the ankle.  IMPRESSION: No deep venous thrombosis in the visualized bilateral lower extremities.   Electronically Signed   By: Charline Bills M.D.   On: 11/29/2012 12:49  Dg Chest Portable 1 View  11/28/2012   CLINICAL DATA:  Short of breath  EXAM: PORTABLE CHEST - 1 VIEW  COMPARISON:  11/28/2012.  FINDINGS: The lungs appear hyperinflated. There are mildly coarsened interstitial markings noted bilaterally. No pleural effusion or edema. No airspace consolidation.  IMPRESSION: 1. No acute finding   Electronically Signed   By: Signa Kell M.D.   On: 11/28/2012 12:11    Lab Results: Basic Metabolic Panel: No results found for this basename: NA, K, CL, CO2, GLUCOSE, BUN, CREATININE, CALCIUM, MG, PHOS,  in the last 72 hours Liver Function  Tests: No results found for this basename: AST, ALT, ALKPHOS, BILITOT, PROT, ALBUMIN,  in the last 72 hours   CBC: No results found for this basename: WBC, NEUTROABS, HGB, HCT, MCV, PLT,  in the last 72 hours  Recent Results (from the past 240 hour(s))  MRSA PCR SCREENING     Status: None   Collection Time    11/28/12  3:50 PM      Result Value Range Status   MRSA by PCR NEGATIVE  NEGATIVE Final   Comment:            The GeneXpert MRSA Assay (FDA     approved for NASAL specimens     only), is one component of a     comprehensive MRSA colonization     surveillance program. It is not     intended to diagnose MRSA     infection nor to guide or     monitor treatment for     MRSA infections.     Hospital Course: This is a 57 year old with known COPD sleep apnea and diastolic congestive heart failure. He had been trying to move and became more short of breath. He admits to some dietary noncompliance. His weight went up. He was seen in the clinic in [facility] and sent to the emergency department from there. When he was seen in the emergency department he was noted to be short of breath was placed on BiPAP and admitted to step down. He was treated for congestive heart failure and COPD. The next morning he was much improved and then even better soon after that. His weight was down he had diuresed about 4 L felt better was able to ambulate in the room. He was reminded about his diet and felt that he could control that at home. By the time of discharge he was back at baseline comfortable ambulating.  I discussed with Cardiology consultants and they and I felt he was ready for discharge  Discharge Exam: Blood pressure 114/61, pulse 104, temperature 98.3 F (36.8 C), temperature source Oral, resp. rate 20, height 5\' 9"  (1.753 m), weight 103.1 kg (227 lb 4.7 oz), SpO2 93.00%. He is awake and alert. His chest is clear. Heart is regular. Abdomen is soft.  Disposition: Home with home  health services      Discharge Orders   Future Appointments Provider Department Dept Phone   12/16/2012 11:20 AM Collier Bullock [facility] [place] [facility] [phone]   Future Orders Complete By Expires   Discharge patient  As directed    Face-to-face encounter (required for Medicare/Medicaid patients)  As directed    Comments:     I Sharvi Mooneyhan L certify that this patient is under my care and that I, or a nurse practitioner or physician's assistant working with me, had a face-to-face encounter that meets the physician face-to-face encounter requirements with this patient on  12/01/2012. The encounter with the patient was in whole, or in part for the following medical condition(s) which is the primary reason for home health care (List medical condition): copd/chf   Questions:     The encounter with the patient was in whole, or in part, for the following medical condition, which is the primary reason for home health care:  copd/chf   I certify that, based on my findings, the following services are medically necessary home health services:  Nursing   My clinical findings support the need for the above services:  Shortness of breath with activity   Further, I certify that my clinical findings support that this patient is homebound due to:  Shortness of Breath with activity   Reason for Medically Necessary Home Health Services:  Skilled Nursing- Change/Decline in Patient Status   Home Health  As directed    Questions:     To provide the following care/treatments:  RN      Follow-up Information   Follow up with [facility].   Contact information:   [address] [place] [address] [phone]      Signed: Fredirick Maudlin Pager [phone]  12/03/2012, 8:53 AM

## 2012-12-07 ENCOUNTER — Other Ambulatory Visit: Payer: Self-pay | Admitting: *Deleted

## 2012-12-07 DIAGNOSIS — M7652 Patellar tendinitis, left knee: Secondary | ICD-10-CM

## 2012-12-07 MED ORDER — HYDROCODONE-ACETAMINOPHEN 5-325 MG PO TABS: 1.0000 | ORAL_TABLET | Freq: Four times a day (QID) | ORAL | Status: AC | PRN

## 2012-12-16 ENCOUNTER — Ambulatory Visit (INDEPENDENT_AMBULATORY_CARE_PROVIDER_SITE_OTHER): Payer: Medicare HMO | Admitting: Physician Assistant

## 2012-12-16 ENCOUNTER — Encounter: Payer: Self-pay | Admitting: Physician Assistant

## 2012-12-16 VITALS — BP 126/65 | HR 103 | Ht 70.0 in | Wt 224.8 lb

## 2012-12-16 DIAGNOSIS — I498 Other specified cardiac arrhythmias: Secondary | ICD-10-CM

## 2012-12-16 DIAGNOSIS — J449 Chronic obstructive pulmonary disease, unspecified: Secondary | ICD-10-CM

## 2012-12-16 DIAGNOSIS — I5032 Chronic diastolic (congestive) heart failure: Secondary | ICD-10-CM

## 2012-12-16 DIAGNOSIS — R000 Tachycardia, unspecified: Secondary | ICD-10-CM
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
## 2012-12-16 NOTE — Assessment & Plan Note (Signed)
Patient heart rate is 105/m today but he did struggle walking in to the office his O2 sats were 82% and he uses inhalers prior to coming here. I will make no medicine changes today.

## 2012-12-16 NOTE — Patient Instructions (Addendum)
Your physician recommends that you schedule a follow-up appointment in: 2 months with Dr. Koneswaran   

## 2012-12-16 NOTE — Assessment & Plan Note (Signed)
Patient has significant COPD on home oxygen. Dr. Juanetta Gosling is trying to get him portable oxygen. This is really needed as his O2 sats dropped to 82% while walking into our office. There is no evidence of heart failure and exam today.

## 2012-12-16 NOTE — Assessment & Plan Note (Signed)
Patient had recent hospitalization for acute on chronic diastolic heart failure. He is well compensated today. I will  not make any changes in his medication

## 2012-12-29 ENCOUNTER — Encounter (HOSPITAL_COMMUNITY): Payer: Self-pay | Admitting: Emergency Medicine

## 2012-12-29 ENCOUNTER — Emergency Department (HOSPITAL_COMMUNITY)
Admission: EM | Admit: 2012-12-29 | Discharge: 2012-12-29 | Disposition: A | Discharge status: Home / Self Care | Payer: Medicare HMO | Attending: Emergency Medicine | Admitting: Emergency Medicine

## 2012-12-29 DIAGNOSIS — J441 Chronic obstructive pulmonary disease with (acute) exacerbation: Secondary | ICD-10-CM | POA: Insufficient documentation

## 2012-12-29 DIAGNOSIS — Z87891 Personal history of nicotine dependence: Secondary | ICD-10-CM | POA: Insufficient documentation

## 2012-12-29 DIAGNOSIS — N182 Chronic kidney disease, stage 2 (mild): Secondary | ICD-10-CM | POA: Insufficient documentation

## 2012-12-29 DIAGNOSIS — H5440 Blindness, one eye, unspecified eye: Secondary | ICD-10-CM | POA: Insufficient documentation

## 2012-12-29 DIAGNOSIS — Z79899 Other long term (current) drug therapy: Secondary | ICD-10-CM | POA: Insufficient documentation

## 2012-12-29 DIAGNOSIS — Z872 Personal history of diseases of the skin and subcutaneous tissue: Secondary | ICD-10-CM | POA: Insufficient documentation

## 2012-12-29 DIAGNOSIS — E669 Obesity, unspecified: Secondary | ICD-10-CM | POA: Insufficient documentation

## 2012-12-29 DIAGNOSIS — J449 Chronic obstructive pulmonary disease, unspecified: Secondary | ICD-10-CM

## 2012-12-29 DIAGNOSIS — Z87448 Personal history of other diseases of urinary system: Secondary | ICD-10-CM | POA: Insufficient documentation

## 2012-12-29 DIAGNOSIS — IMO0002 Reserved for concepts with insufficient information to code with codable children: Secondary | ICD-10-CM | POA: Insufficient documentation

## 2012-12-29 DIAGNOSIS — Z792 Long term (current) use of antibiotics: Secondary | ICD-10-CM | POA: Insufficient documentation

## 2012-12-29 DIAGNOSIS — I129 Hypertensive chronic kidney disease with stage 1 through stage 4 chronic kidney disease, or unspecified chronic kidney disease: Secondary | ICD-10-CM | POA: Insufficient documentation

## 2012-12-29 MED ORDER — IPRATROPIUM BROMIDE 0.02 % IN SOLN
0.5000 mg | Freq: Once | RESPIRATORY_TRACT | Status: AC
  Administered 2012-12-29: 0.5 mg via RESPIRATORY_TRACT
  Filled 2012-12-29: qty 2.5

## 2012-12-29 MED ORDER — ALBUTEROL SULFATE (5 MG/ML) 0.5% IN NEBU
2.5000 mg | INHALATION_SOLUTION | Freq: Once | RESPIRATORY_TRACT | Status: AC
  Administered 2012-12-29: 2.5 mg via RESPIRATORY_TRACT
  Filled 2012-12-29: qty 0.5

## 2012-12-29 NOTE — ED Notes (Signed)
Pt verified power is back on at his home for discharge.

## 2012-12-29 NOTE — ED Notes (Signed)
Pt resting quietly.  No distress noted.  
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
## 2012-12-29 NOTE — ED Notes (Signed)
Pt reporting ease of breathing following nebulizer treatment.

## 2012-12-29 NOTE — ED Notes (Signed)
Pt resting quietly with eyes closed.  Respirations regular, even and unlabored.

## 2012-12-29 NOTE — ED Notes (Signed)
Pt called home. Electricity is now on and pt is able to continue O2 at home. PT does not normally use portable O2 and states he is able to travel short distances without it. Pt home with family member. NAD.

## 2013-01-03 ENCOUNTER — Inpatient Hospital Stay (HOSPITAL_COMMUNITY)
Admission: EM | Admit: 2013-01-03 | Discharge: 2013-01-05 | DRG: 191 | Disposition: A | Discharge status: Home Health | Payer: Medicare HMO | Attending: Pulmonary Disease | Admitting: Pulmonary Disease

## 2013-01-03 ENCOUNTER — Inpatient Hospital Stay (HOSPITAL_COMMUNITY): Payer: Medicare HMO

## 2013-01-03 ENCOUNTER — Encounter (HOSPITAL_COMMUNITY): Payer: Self-pay | Admitting: Emergency Medicine

## 2013-01-03 ENCOUNTER — Emergency Department (HOSPITAL_COMMUNITY): Payer: Medicare HMO

## 2013-01-03 DIAGNOSIS — G4730 Sleep apnea, unspecified: Secondary | ICD-10-CM | POA: Diagnosis present

## 2013-01-03 DIAGNOSIS — Z808 Family history of malignant neoplasm of other organs or systems: Secondary | ICD-10-CM

## 2013-01-03 DIAGNOSIS — Z79899 Other long term (current) drug therapy: Secondary | ICD-10-CM

## 2013-01-03 DIAGNOSIS — K648 Other hemorrhoids: Secondary | ICD-10-CM | POA: Diagnosis present

## 2013-01-03 DIAGNOSIS — Z23 Encounter for immunization: Secondary | ICD-10-CM

## 2013-01-03 DIAGNOSIS — Z87891 Personal history of nicotine dependence: Secondary | ICD-10-CM

## 2013-01-03 DIAGNOSIS — J441 Chronic obstructive pulmonary disease with (acute) exacerbation: Principal | ICD-10-CM | POA: Diagnosis present

## 2013-01-03 DIAGNOSIS — K922 Gastrointestinal hemorrhage, unspecified: Secondary | ICD-10-CM

## 2013-01-03 DIAGNOSIS — R1310 Dysphagia, unspecified: Secondary | ICD-10-CM | POA: Diagnosis present

## 2013-01-03 DIAGNOSIS — N182 Chronic kidney disease, stage 2 (mild): Secondary | ICD-10-CM | POA: Diagnosis present

## 2013-01-03 DIAGNOSIS — R0602 Shortness of breath: Secondary | ICD-10-CM

## 2013-01-03 DIAGNOSIS — K5900 Constipation, unspecified: Secondary | ICD-10-CM | POA: Diagnosis present

## 2013-01-03 DIAGNOSIS — D649 Anemia, unspecified: Secondary | ICD-10-CM | POA: Diagnosis present

## 2013-01-03 DIAGNOSIS — E876 Hypokalemia: Secondary | ICD-10-CM | POA: Diagnosis present

## 2013-01-03 DIAGNOSIS — K219 Gastro-esophageal reflux disease without esophagitis: Secondary | ICD-10-CM | POA: Diagnosis present

## 2013-01-03 DIAGNOSIS — Z9981 Dependence on supplemental oxygen: Secondary | ICD-10-CM

## 2013-01-03 DIAGNOSIS — J9620 Acute and chronic respiratory failure, unspecified whether with hypoxia or hypercapnia: Secondary | ICD-10-CM

## 2013-01-03 DIAGNOSIS — R000 Tachycardia, unspecified: Secondary | ICD-10-CM

## 2013-01-03 DIAGNOSIS — I5032 Chronic diastolic (congestive) heart failure: Secondary | ICD-10-CM

## 2013-01-03 DIAGNOSIS — I129 Hypertensive chronic kidney disease with stage 1 through stage 4 chronic kidney disease, or unspecified chronic kidney disease: Secondary | ICD-10-CM | POA: Diagnosis present

## 2013-01-03 DIAGNOSIS — Z800 Family history of malignant neoplasm of digestive organs: Secondary | ICD-10-CM

## 2013-01-03 DIAGNOSIS — Z8249 Family history of ischemic heart disease and other diseases of the circulatory system: Secondary | ICD-10-CM

## 2013-01-03 DIAGNOSIS — I509 Heart failure, unspecified: Secondary | ICD-10-CM | POA: Diagnosis present

## 2013-01-03 DIAGNOSIS — L408 Other psoriasis: Secondary | ICD-10-CM | POA: Diagnosis present

## 2013-01-03 DIAGNOSIS — I10 Essential (primary) hypertension: Secondary | ICD-10-CM | POA: Diagnosis present

## 2013-01-03 DIAGNOSIS — Z825 Family history of asthma and other chronic lower respiratory diseases: Secondary | ICD-10-CM

## 2013-01-03 HISTORY — DX: Heart failure, unspecified: I50.9

## 2013-01-03 HISTORY — DX: Disorder of kidney and ureter, unspecified: N28.9

## 2013-01-03 LAB — CBC WITH DIFFERENTIAL/PLATELET
Eosinophils Absolute: 0.1 10*3/uL (ref 0.0–0.7)
Eosinophils Relative: 2 % (ref 0–5)
HCT: 34.1 % — ABNORMAL LOW (ref 39.0–52.0)
Hemoglobin: 11.4 g/dL — ABNORMAL LOW (ref 13.0–17.0)
Lymphocytes Relative: 20 % (ref 12–46)
Lymphs Abs: 0.9 10*3/uL (ref 0.7–4.0)
MCH: 30.6 pg (ref 26.0–34.0)
MCV: 91.7 fL (ref 78.0–100.0)
Monocytes Absolute: 0.5 10*3/uL (ref 0.1–1.0)
Monocytes Relative: 12 % (ref 3–12)
Platelets: 261 10*3/uL (ref 150–400)
RBC: 3.72 MIL/uL — ABNORMAL LOW (ref 4.22–5.81)
WBC: 4.5 10*3/uL (ref 4.0–10.5)

## 2013-01-03 LAB — BASIC METABOLIC PANEL
BUN: 8 mg/dL (ref 6–23)
CO2: 36 mEq/L — ABNORMAL HIGH (ref 19–32)
Calcium: 9.7 mg/dL (ref 8.4–10.5)
GFR calc non Af Amer: 60 mL/min — ABNORMAL LOW (ref >90)
Glucose, Bld: 106 mg/dL — ABNORMAL HIGH (ref 70–99)
Sodium: 140 mEq/L (ref 135–145)

## 2013-01-03 LAB — PRO B NATRIURETIC PEPTIDE: Pro B Natriuretic peptide (BNP): 787.4 pg/mL — ABNORMAL HIGH (ref 0–125)

## 2013-01-03 LAB — TROPONIN I: Troponin I: <0.3 ng/mL (ref <0.30)

## 2013-01-03 MED ORDER — ALBUTEROL SULFATE (5 MG/ML) 0.5% IN NEBU
5.0000 mg | INHALATION_SOLUTION | RESPIRATORY_TRACT | Status: AC
  Administered 2013-01-03: 5 mg via RESPIRATORY_TRACT
  Filled 2013-01-03: qty 1

## 2013-01-03 MED ORDER — SODIUM CHLORIDE 0.9 % IJ SOLN
3.0000 mL | INTRAMUSCULAR | Status: DC | PRN
  Administered 2013-01-04: 3 mL via INTRAVENOUS

## 2013-01-03 MED ORDER — BIOTENE DRY MOUTH MT LIQD
15.0000 mL | Freq: Two times a day (BID) | OROMUCOSAL | Status: DC
  Administered 2013-01-04 – 2013-01-05 (×3): 15 mL via OROMUCOSAL

## 2013-01-03 MED ORDER — ENOXAPARIN SODIUM 120 MG/0.8ML ~~LOC~~ SOLN
SUBCUTANEOUS | Status: AC
  Filled 2013-01-03: qty 0.8

## 2013-01-03 MED ORDER — LEVALBUTEROL HCL 0.63 MG/3ML IN NEBU
0.6300 mg | INHALATION_SOLUTION | Freq: Four times a day (QID) | RESPIRATORY_TRACT | Status: DC
  Administered 2013-01-03: 0.63 mg via RESPIRATORY_TRACT
  Filled 2013-01-03: qty 3

## 2013-01-03 MED ORDER — SODIUM CHLORIDE 0.9 % IJ SOLN
3.0000 mL | Freq: Two times a day (BID) | INTRAMUSCULAR | Status: DC
  Administered 2013-01-03 – 2013-01-05 (×4): 3 mL via INTRAVENOUS

## 2013-01-03 MED ORDER — FAMOTIDINE IN NACL 20-0.9 MG/50ML-% IV SOLN
20.0000 mg | Freq: Once | INTRAVENOUS | Status: AC
  Administered 2013-01-03: 20 mg via INTRAVENOUS
  Filled 2013-01-03: qty 50

## 2013-01-03 MED ORDER — METHYLPREDNISOLONE SODIUM SUCC 40 MG IJ SOLR
40.0000 mg | Freq: Four times a day (QID) | INTRAMUSCULAR | Status: DC
  Administered 2013-01-03 – 2013-01-05 (×7): 40 mg via INTRAVENOUS
  Filled 2013-01-03 (×7): qty 1

## 2013-01-03 MED ORDER — SODIUM CHLORIDE 0.9 % IV SOLN
250.0000 mL | INTRAVENOUS | Status: DC | PRN
  Administered 2013-01-03: 10 mL/h via INTRAVENOUS

## 2013-01-03 MED ORDER — PANTOPRAZOLE SODIUM 40 MG IV SOLR
40.0000 mg | INTRAVENOUS | Status: AC
  Administered 2013-01-03: 40 mg via INTRAVENOUS
  Filled 2013-01-03: qty 40

## 2013-01-03 MED ORDER — HYDROCODONE-ACETAMINOPHEN 5-325 MG PO TABS: 1.0000 | ORAL_TABLET | Freq: Four times a day (QID) | ORAL | Status: DC | PRN

## 2013-01-03 MED ORDER — SODIUM CHLORIDE 0.9 % IJ SOLN
3.0000 mL | Freq: Two times a day (BID) | INTRAMUSCULAR | Status: DC
  Administered 2013-01-04 – 2013-01-05 (×3): 3 mL via INTRAVENOUS

## 2013-01-03 MED ORDER — INFLUENZA VAC SPLIT QUAD 0.5 ML IM SUSP
0.5000 mL | INTRAMUSCULAR | Status: AC
  Administered 2013-01-04: 0.5 mL via INTRAMUSCULAR
  Filled 2013-01-03: qty 0.5

## 2013-01-03 MED ORDER — IPRATROPIUM BROMIDE 0.02 % IN SOLN
0.5000 mg | Freq: Four times a day (QID) | RESPIRATORY_TRACT | Status: DC
  Administered 2013-01-03: 0.5 mg via RESPIRATORY_TRACT
  Filled 2013-01-03: qty 2.5

## 2013-01-03 MED ORDER — IPRATROPIUM BROMIDE 0.02 % IN SOLN
0.5000 mg | RESPIRATORY_TRACT | Status: DC
  Administered 2013-01-04 – 2013-01-05 (×9): 0.5 mg via RESPIRATORY_TRACT
  Filled 2013-01-03 (×10): qty 2.5

## 2013-01-03 MED ORDER — PANTOPRAZOLE SODIUM 40 MG PO TBEC
40.0000 mg | DELAYED_RELEASE_TABLET | Freq: Every day | ORAL | Status: DC
  Administered 2013-01-04: 40 mg via ORAL
  Filled 2013-01-03: qty 1

## 2013-01-03 MED ORDER — ENOXAPARIN SODIUM 120 MG/0.8ML ~~LOC~~ SOLN
1.0000 mg/kg | Freq: Once | SUBCUTANEOUS | Status: AC
  Administered 2013-01-03: 100 mg via SUBCUTANEOUS
  Filled 2013-01-03: qty 0.8

## 2013-01-03 MED ORDER — POTASSIUM CHLORIDE CRYS ER 20 MEQ PO TBCR
40.0000 meq | EXTENDED_RELEASE_TABLET | Freq: Every day | ORAL | Status: AC
  Administered 2013-01-03 – 2013-01-04 (×2): 40 meq via ORAL
  Filled 2013-01-03 (×2): qty 2

## 2013-01-03 MED ORDER — POTASSIUM CHLORIDE 10 MEQ/100ML IV SOLN
10.0000 meq | INTRAVENOUS | Status: AC
  Administered 2013-01-03 – 2013-01-04 (×3): 10 meq via INTRAVENOUS
  Filled 2013-01-03: qty 100

## 2013-01-03 MED ORDER — DOCUSATE SODIUM 100 MG PO CAPS
100.0000 mg | ORAL_CAPSULE | Freq: Two times a day (BID) | ORAL | Status: DC
  Administered 2013-01-03 – 2013-01-05 (×4): 100 mg via ORAL
  Filled 2013-01-03 (×4): qty 1

## 2013-01-03 MED ORDER — FUROSEMIDE 10 MG/ML IJ SOLN
40.0000 mg | Freq: Two times a day (BID) | INTRAMUSCULAR | Status: AC
  Administered 2013-01-03 – 2013-01-04 (×3): 40 mg via INTRAVENOUS
  Filled 2013-01-03 (×3): qty 4

## 2013-01-03 MED ORDER — ONDANSETRON HCL 4 MG/2ML IJ SOLN: 4.0000 mg | Freq: Three times a day (TID) | INTRAMUSCULAR | Status: AC | PRN

## 2013-01-03 MED ORDER — CLONIDINE HCL 0.2 MG PO TABS
0.2000 mg | ORAL_TABLET | Freq: Three times a day (TID) | ORAL | Status: DC
  Administered 2013-01-03 – 2013-01-05 (×5): 0.2 mg via ORAL
  Filled 2013-01-03 (×5): qty 1

## 2013-01-03 MED ORDER — LEVALBUTEROL HCL 0.63 MG/3ML IN NEBU
0.6300 mg | INHALATION_SOLUTION | RESPIRATORY_TRACT | Status: DC
  Administered 2013-01-04 – 2013-01-05 (×9): 0.63 mg via RESPIRATORY_TRACT
  Filled 2013-01-03 (×9): qty 3

## 2013-01-03 MED ORDER — GUAIFENESIN ER 600 MG PO TB12
1200.0000 mg | ORAL_TABLET | Freq: Two times a day (BID) | ORAL | Status: DC
  Administered 2013-01-03 – 2013-01-05 (×4): 1200 mg via ORAL
  Filled 2013-01-03 (×4): qty 2
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
## 2013-01-03 NOTE — ED Notes (Signed)
Pt c/o sob worse today, hx of COPD. C/o blood in stool x 2 days. Stool has been hard. Pt on 2.5 Rentz 02 at home. Continued in ED. Tingling to left arm and hand 1 month. pcp aware of tingling and was given "pills". Slight sob noted with intermittent grunting but pt states he always breaths and grunts sometimes.

## 2013-01-03 NOTE — ED Provider Notes (Signed)
CSN: [id number]     Arrival date & time 01/03/13  1833 History   First MD Initiated Contact with Patient 01/03/13 1842     Chief Complaint  Patient presents with  . Rectal Bleeding  . Shortness of Breath   (Consider location/radiation/quality/duration/timing/severity/associated sxs/prior Treatment) HPI Comments: 57 year old male with a history of hypertension, recent diagnosis of congestive heart failure and COPD. He states that he has several complaints including shortness of breath and blood in his stools.  #1 shortness of breath: The patient is a history of chronic COPD from 35 years of smoking and requires to a half liters of oxygen by nasal cannula at home daily. Over last several days he has had some increased shortness of breath, orthopnea, dyspnea on exertion as well as increased coughing.  #2 the patient has had blood in his stools for 2 days. This is described as dark red, voluminous, he denies taking anticoagulants, denies any rectal foreign bodies or injury, denies chronic use of prednisone or NSAIDs though of note the patient does have chronic COPD and is likely had steroid use intermittently.  Nothing seems to make either of these 2 complaints better, they are gradually getting worse, they are now severe.  Patient is a 57 y.o. male presenting with hematochezia and shortness of breath. The history is provided by the patient and medical records.  Rectal Bleeding Shortness of Breath   Past Medical History  Diagnosis Date  . Hypertension   . Dyspnea   . Chronic kidney disease, stage 2, mildly decreased GFR     Creatinine of 1.31 in 04/2011  . COPD (chronic obstructive pulmonary disease)     moderate by spirometry in 08/2009;FEV1 of 1.1 ;nl alpha -1 antitrypsin   . Obesity   . Psoriasis   . Left eye trauma     with loss of vision  . Erectile dysfunction   . Lumbosacral spinal stenosis     chronic low back pain  . Headache(784.0)   . Sleep apnea     O2 at night  .  Carotid artery aneurysm   . Asthma   . Psoriasis    Past Surgical History  Procedure Laterality Date  . Orif patella  12/14/2005    left fracture Dr.Harrison   . Ophthalmologic surgery  1963    secondary to left eye trauma, as a child hit in eye with tree limb   . Colonoscopy N/A 05/06/2012    Procedure: COLONOSCOPY;  Surgeon: Corbin Ade, MD;  Location: [facility];  Service: Endoscopy;  Laterality: N/A;  1:15   Family History  Problem Relation Age of Onset  . Heart disease    . Arthritis    . Lung disease    . Cancer    . Asthma    . Colon cancer Brother     deceased at 38   . Brain cancer Brother    History  Substance Use Topics  . Smoking status: Former Smoker -- 1.00 packs/day for 20 years    Quit date: 06/10/2010  . Smokeless tobacco: Never Used     Comment: Quit in 06/2010  . Alcohol Use: 0.5 oz/week    1 drink(s) per week     Comment: hx of ETOH abuse, none since hospitalization in December 2013     Review of Systems  Respiratory: Positive for shortness of breath.   Gastrointestinal: Positive for hematochezia.  All other systems reviewed and are negative.    Allergies  Bee venom  Home Medications   Current Outpatient Rx  Name  Route  Sig  Dispense  Refill  . cloNIDine (CATAPRES) 0.2 MG tablet   Oral   Take 0.2 mg by mouth 3 (three) times daily.         . fluticasone (CUTIVATE) 0.05 % cream               . methotrexate (RHEUMATREX) 2.5 MG tablet   Oral   Take 10 mg by mouth once a week.          Marland Kitchen ADVAIR DISKUS 250-50 MCG/DOSE AEPB   Inhalation   Inhale 1 puff into the lungs 2 (two) times daily.          Marland Kitchen albuterol (PROVENTIL HFA;VENTOLIN HFA) 108 (90 BASE) MCG/ACT inhaler   Inhalation   Inhale 2 puffs into the lungs as needed for wheezing (rescue inhaler).          Marland Kitchen azithromycin (ZITHROMAX) 250 MG tablet      Take one daily   6 each   0   . cephALEXin (KEFLEX) 500 MG capsule   Oral   Take 1 capsule (500 mg total) by  mouth 3 (three) times daily.   21 capsule   0   . Fluticasone Furoate-Vilanterol (BREO ELLIPTA) 100-25 MCG/INH AEPB   Inhalation   Inhale 1 puff into the lungs daily as needed (Uses when he doesn't have Advair Inhaler.).         Marland Kitchen furosemide (LASIX) 40 MG tablet   Oral   Take 1 tablet (40 mg total) by mouth 2 (two) times daily.   30 tablet   5   . guaiFENesin (MUCINEX) 600 MG 12 hr tablet   Oral   Take 1,200 mg by mouth 2 (two) times daily.         Marland Kitchen HYDROcodone-acetaminophen (NORCO/VICODIN) 5-325 MG per tablet   Oral   Take 1 tablet by mouth every 6 (six) hours as needed for pain.   120 tablet   0   . lisinopril (PRINIVIL,ZESTRIL) 40 MG tablet   Oral   Take 40 mg by mouth daily.          . pantoprazole (PROTONIX) 40 MG tablet               . ranitidine (ZANTAC) 300 MG tablet   Oral   Take 300 mg by mouth at bedtime.          BP 129/69  Pulse 112  Temp(Src) 99.2 F (37.3 C) (Oral)  Resp 20  SpO2 98% Physical Exam  Nursing note and vitals reviewed. Constitutional: He appears well-developed and well-nourished. No distress.  HENT:  Head: Normocephalic and atraumatic.  Mouth/Throat: Oropharynx is clear and moist. No oropharyngeal exudate.  Eyes: EOM are normal. Pupils are equal, round, and reactive to light. Right eye exhibits no discharge. Left eye exhibits no discharge. No scleral icterus.  Pale conjunctiva  Neck: Normal range of motion. Neck supple. No JVD present. No thyromegaly present.  Cardiovascular: Regular rhythm, normal heart sounds and intact distal pulses.  Exam reveals no gallop and no friction rub.   No murmur heard. Tachycardic to 130 beats per minute in sinus tach  Pulmonary/Chest: Breath sounds normal. He is in respiratory distress. He has no wheezes. He has no rales.  Slight increased work of breathing, decreased breath sounds in all quadrants, no wheezing or rales  Abdominal: Soft. Bowel sounds are normal. He exhibits no distension  and no mass. There  is no tenderness.  Genitourinary:  Rectal exam shows reddish mucus, Hemoccult positive  Musculoskeletal: Normal range of motion. He exhibits edema ( Bilateral pitting edema, no asymmetry). He exhibits no tenderness.  Lymphadenopathy:    He has no cervical adenopathy.  Neurological: He is alert. Coordination normal.  Skin: Skin is warm and dry. No rash noted. No erythema.  Psychiatric: He has a normal mood and affect. His behavior is normal.    ED Course  Procedures (including critical care time) Labs Review Labs Reviewed  CBC WITH DIFFERENTIAL - Abnormal; Notable for the following:    RBC 3.72 (*)    Hemoglobin 11.4 (*)    HCT 34.1 (*)    All other components within normal limits  BASIC METABOLIC PANEL - Abnormal; Notable for the following:    Potassium 2.8 (*)    Chloride 95 (*)    CO2 36 (*)    Glucose, Bld 106 (*)    GFR calc non Af Amer 60 (*)    GFR calc Af Amer 69 (*)    All other components within normal limits  PRO B NATRIURETIC PEPTIDE - Abnormal; Notable for the following:    Pro B Natriuretic peptide (BNP) 787.4 (*)    All other components within normal limits  TROPONIN I   Imaging Review Dg Chest Portable 1 View  01/03/2013   CLINICAL DATA:  Shortness of breath.  EXAM: PORTABLE CHEST - 1 VIEW  COMPARISON:  November 28, 2012.  FINDINGS: The heart size and mediastinal contours are within normal limits. Both lungs are clear. The visualized skeletal structures are unremarkable.  IMPRESSION: No active disease.   Electronically Signed   By: Roque Lias M.D.   On: 01/03/2013 19:13    EKG Interpretation     Ventricular Rate:  134 PR Interval:  132 QRS Duration: 80 QT Interval:  308 QTC Calculation: 459 R Axis:   58 Text Interpretation:  Sinus tachycardia with Premature atrial complexes Possible Left atrial enlargement Nonspecific ST and T wave abnormality Abnormal ECG When compared with ECG of 28-Nov-2012 11:21, Premature ventricular complexes  are no longer Present Premature atrial complexes are now Present            MDM   1. Shortness of breath   2. Tachycardia   3. GI bleed    The patient has shortness of breath with no hypoxia but does have a significant tachycardia. I would consider that this could be related to his pulmonary disease but would also consider anemia from a GI bleed. No history of GI bleed per the patient, he does take antacid medications including Zantac and Protonix. He describes it element of chest discomfort that gets better when he belches and gets better after Zantac. He will need labs to show evaluation for anemia as well as chest x-ray and cardiac workup as well. Supplemental oxygen, cardiac monitoring, IV fluids, IV Pepcid and Protonix.  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  Labs today show that the patient has hypokalemia, preserved renal function and a slight drop in his hemoglobin to 11.4 which at this time does not seem to be a significant source of his tachycardia. His chest x-ray is negative, troponin is normal and his EKG shows sinus tachycardia. The patient will need  to be admitted for multiple reasons including his GI bleed in addition to the shortness of breath and tachycardia.  No definite source for tachycardia found - pt could have PE - needs admission for ongoing sx - his hgb is relatively stable - recent investigation by colonoscopy and endoscopy shows no reason for acute GI intervention (possible internal hemorrhoid), d/w Dr. Sharl Ma who will admit for Dr. Juanetta Gosling.    Vida Roller, MD 01/03/13 Corky Crafts

## 2013-01-04 ENCOUNTER — Inpatient Hospital Stay (HOSPITAL_COMMUNITY): Payer: Medicare HMO

## 2013-01-04 ENCOUNTER — Telehealth: Payer: Self-pay | Admitting: Orthopedic Surgery

## 2013-01-04 ENCOUNTER — Encounter (HOSPITAL_COMMUNITY): Payer: Self-pay

## 2013-01-04 DIAGNOSIS — K625 Hemorrhage of anus and rectum: Secondary | ICD-10-CM

## 2013-01-04 LAB — COMPREHENSIVE METABOLIC PANEL
ALT: 17 U/L (ref 0–53)
AST: 19 U/L (ref 0–37)
Albumin: 3.2 g/dL — ABNORMAL LOW (ref 3.5–5.2)
Alkaline Phosphatase: 58 U/L (ref 39–117)
CO2: 33 mEq/L — ABNORMAL HIGH (ref 19–32)
Calcium: 9.1 mg/dL (ref 8.4–10.5)
Chloride: 100 mEq/L (ref 96–112)
GFR calc non Af Amer: 64 mL/min — ABNORMAL LOW (ref >90)
Glucose, Bld: 176 mg/dL — ABNORMAL HIGH (ref 70–99)
Potassium: 3.9 mEq/L (ref 3.5–5.1)
Sodium: 140 mEq/L (ref 135–145)
Total Bilirubin: 0.5 mg/dL (ref 0.3–1.2)

## 2013-01-04 LAB — CBC
HCT: 33.4 % — ABNORMAL LOW (ref 39.0–52.0)
Platelets: 256 10*3/uL (ref 150–400)
RBC: 3.63 MIL/uL — ABNORMAL LOW (ref 4.22–5.81)
RDW: 13.6 % (ref 11.5–15.5)
WBC: 3.6 10*3/uL — ABNORMAL LOW (ref 4.0–10.5)

## 2013-01-04 MED ORDER — PANTOPRAZOLE SODIUM 40 MG PO TBEC
40.0000 mg | DELAYED_RELEASE_TABLET | Freq: Two times a day (BID) | ORAL | Status: DC
  Administered 2013-01-04 – 2013-01-05 (×2): 40 mg via ORAL
  Filled 2013-01-04 (×2): qty 1

## 2013-01-04 MED ORDER — TECHNETIUM TC 99M DIETHYLENETRIAME-PENTAACETIC ACID
40.0000 | Freq: Once | INTRAVENOUS | Status: AC | PRN
  Administered 2013-01-04: 40 via RESPIRATORY_TRACT

## 2013-01-04 MED ORDER — POLYETHYLENE GLYCOL 3350 17 G PO PACK
17.0000 g | PACK | Freq: Every day | ORAL | Status: DC
  Administered 2013-01-04 – 2013-01-05 (×2): 17 g via ORAL
  Filled 2013-01-04 (×2): qty 1

## 2013-01-04 MED ORDER — HYDROCORTISONE ACETATE 25 MG RE SUPP
25.0000 mg | Freq: Two times a day (BID) | RECTAL | Status: DC
  Administered 2013-01-04: 25 mg via RECTAL
  Filled 2013-01-04 (×6): qty 1

## 2013-01-04 MED ORDER — TECHNETIUM TO 99M ALBUMIN AGGREGATED
6.0000 | Freq: Once | INTRAVENOUS | Status: AC | PRN
  Administered 2013-01-04: 6 via INTRAVENOUS

## 2013-01-04 MED ORDER — PANTOPRAZOLE SODIUM 40 MG PO TBEC: 40.0000 mg | DELAYED_RELEASE_TABLET | Freq: Every day | ORAL | Status: DC

## 2013-01-04 NOTE — Progress Notes (Signed)
UR chart review completed.  

## 2013-01-04 NOTE — Telephone Encounter (Signed)
[name] wants a prescription for Hydrocodone.  He was last seen here 11/20/11.

## 2013-01-04 NOTE — Consult Note (Signed)
Attending note:  Patient seen and examined. Barium pill esophagram reveals dysmotility but no obstruction. Agree with a course of twice a day PPI therapy. Gross blood per rectum likely hemorrhoidal in origin. We'll plan for hemorrhoidal banding in the office in the next couple of weeks. Hopefully, from a GI standpoint, patient can be discharged soon.

## 2013-01-04 NOTE — Consult Note (Signed)
Referring Provider: Dr. Sharl Ma Primary Care Physician:  Fredirick Maudlin, MD Primary Gastroenterologist:  Dr. Jena Gauss   Date of Admission: 01/03/13 Date of Consultation: 01/04/13  Reason for Consultation: Rectal bleeding, dysphagia  HPI:  [name] is a 57 year old male last seen by our practice in Feb 2014 secondary to heme positive stool, anemia, and dysphagia. He underwent a colonoscopy by Dr. Jena Gauss revealing internal hemorrhoids, cecal ulcerations with benign path, tubular adenoma. Surveillance due in 2019. 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  Presented to the ED with increasing shortness of breath, dyspnea on exertion, coughing, tachycardia.  Notes improvement after dilation Feb 2014. States he has a lot of phlegm; if he doesn't cough it up, it feels hard to swallow. Symptoms returned about a month ago. Stays away from tough textures, because he felt a tightness in his chest and worsening reflux. Protonix in the morning, Zantac at night. Feels a tightness in epigastric area. Intermittent. Present about a month. "feels like acid reflux". Burping relieves, takes pressure off. No N/V. Last 2 days noted dark red blood with defecation. Rectal discomfort noted. Significant constipation. "hard" to have a bowel movement. Tries to have a BM every morning. Straining at times. Not productive or feeling fully evacuated. Intermittent Ibuprofen for lower back, leg pain.    Past Medical History  Diagnosis Date  . Hypertension   . Dyspnea   . Chronic kidney disease, stage 2, mildly decreased GFR     Creatinine of 1.31 in 04/2011  . COPD (chronic obstructive pulmonary disease)     moderate by spirometry in 08/2009;FEV1 of 1.1 ;nl alpha -1 antitrypsin   . Obesity   . Psoriasis   . Left eye trauma     with loss of vision  . Erectile dysfunction   . Lumbosacral  spinal stenosis     chronic low back pain  . Headache(784.0)   . Sleep apnea     O2 at night  . Carotid artery aneurysm   . Asthma   . Psoriasis   . CHF (congestive heart failure)   . Renal insufficiency     Past Surgical History  Procedure Laterality Date  . Orif patella  12/14/2005    left fracture Dr.Harrison   . Ophthalmologic surgery  1963    secondary to left eye trauma, as a child hit in eye with tree limb   . Colonoscopy N/A 05/06/2012    RMR: internal hemorrhoids, tubular adenoma, cecal ulcerations with benign path, repeat in 2019  . Esophagogastroduodenoscopy  05/06/12    RMR: probably cervical esophageal web s/p 64 F dilation, hiatal hernia, antral and bulbar erosions with negative H.pylori    Prior to Admission medications   Medication Sig Start Date End Date Taking? Authorizing Provider  ADVAIR DISKUS 250-50 MCG/DOSE AEPB Inhale 1 puff into the lungs 2 (two) times daily.  04/17/12  Yes Historical Provider, MD  albuterol (PROVENTIL HFA;VENTOLIN HFA) 108 (90 BASE) MCG/ACT inhaler Inhale 2 puffs into the lungs as needed for wheezing (rescue inhaler).    Yes Historical Provider, MD  cloNIDine (CATAPRES) 0.2 MG tablet Take 0.2 mg by mouth 3 (three) times daily.   Yes Historical Provider, MD  fluticasone (CUTIVATE) 0.05 % cream  09/21/12  Yes Historical Provider, MD  Fluticasone Furoate-Vilanterol (BREO ELLIPTA) 100-25 MCG/INH AEPB Inhale 1 puff into the lungs daily as needed (Uses when he doesn't  have Advair Inhaler.).   Yes Historical Provider, MD  furosemide (LASIX) 40 MG tablet Take 1 tablet (40 mg total) by mouth 2 (two) times daily. 12/01/12  Yes Fredirick Maudlin, MD  guaiFENesin (MUCINEX) 600 MG 12 hr tablet Take 1,200 mg by mouth 2 (two) times daily.   Yes Historical Provider, MD  HYDROcodone-acetaminophen (NORCO/VICODIN) 5-325 MG per tablet Take 1 tablet by mouth every 6 (six) hours as needed for pain. 12/07/12  Yes Vickki Hearing, MD  lisinopril (PRINIVIL,ZESTRIL) 40 MG  tablet Take 40 mg by mouth daily.  04/06/12  Yes Historical Provider, MD  methotrexate (RHEUMATREX) 2.5 MG tablet Take 10 mg by mouth once a week. Sunday 11/20/12  Yes Historical Provider, MD  pantoprazole (PROTONIX) 40 MG tablet Take 40 mg by mouth daily.  12/08/12  Yes Historical Provider, MD  predniSONE (DELTASONE) 10 MG tablet Take 10 mg by mouth daily. 12/11/12  Yes Historical Provider, MD  ranitidine (ZANTAC) 300 MG tablet Take 300 mg by mouth at bedtime.   Yes Historical Provider, MD    Current Facility-Administered Medications  Medication Dose Route Frequency Provider Last Rate Last Dose  . 0.9 %  sodium chloride infusion  250 mL Intravenous PRN Meredeth Ide, MD 10 mL/hr at 01/03/13 2224 10 mL/hr at 01/03/13 2224  . antiseptic oral rinse (BIOTENE) solution 15 mL  15 mL Mouth Rinse BID Meredeth Ide, MD      . cloNIDine (CATAPRES) tablet 0.2 mg  0.2 mg Oral TID Meredeth Ide, MD   0.2 mg at 01/03/13 2255  . docusate sodium (COLACE) capsule 100 mg  100 mg Oral BID Meredeth Ide, MD   100 mg at 01/03/13 2255  . furosemide (LASIX) injection 40 mg  40 mg Intravenous Q12H Meredeth Ide, MD   40 mg at 01/03/13 2223  . guaiFENesin (MUCINEX) 12 hr tablet 1,200 mg  1,200 mg Oral BID Meredeth Ide, MD   1,200 mg at 01/03/13 2255  . HYDROcodone-acetaminophen (NORCO/VICODIN) 5-325 MG per tablet 1 tablet  1 tablet Oral Q6H PRN Meredeth Ide, MD      . influenza vac split quadrivalent PF (FLUARIX) injection 0.5 mL  0.5 mL Intramuscular Tomorrow-1000 Meredeth Ide, MD      . ipratropium (ATROVENT) nebulizer solution 0.5 mg  0.5 mg Nebulization Q4H Fredirick Maudlin, MD   0.5 mg at 01/04/13 0741  . levalbuterol (XOPENEX) nebulizer solution 0.63 mg  0.63 mg Nebulization Q4H Fredirick Maudlin, MD   0.63 mg at 01/04/13 0742  . methylPREDNISolone sodium succinate (SOLU-MEDROL) 40 mg/mL injection 40 mg  40 mg Intravenous Q6H Meredeth Ide, MD   40 mg at 01/04/13 0552  . ondansetron (ZOFRAN) injection 4 mg  4 mg Intravenous  Q8H PRN Vida Roller, MD      . pantoprazole (PROTONIX) EC tablet 40 mg  40 mg Oral Daily Meredeth Ide, MD      . potassium chloride SA (K-DUR,KLOR-CON) CR tablet 40 mEq  40 mEq Oral Daily Meredeth Ide, MD   40 mEq at 01/03/13 2256  . sodium chloride 0.9 % injection 3 mL  3 mL Intravenous Q12H Meredeth Ide, MD      . sodium chloride 0.9 % injection 3 mL  3 mL Intravenous Q12H Meredeth Ide, MD   3 mL at 01/03/13 2256  . sodium chloride 0.9 % injection 3 mL  3 mL Intravenous PRN Meredeth Ide, MD  3 mL at 01/04/13 0553    Allergies as of 01/03/2013 - Review Complete 01/03/2013  Allergen Reaction Noted  . Bee venom Anaphylaxis 05/13/2011    Family History  Problem Relation Age of Onset  . Heart disease    . Arthritis    . Lung disease    . Cancer    . Asthma    . Colon cancer Brother     deceased at 44   . Brain cancer Brother     History   Social History  . Marital Status: Divorced    Spouse Name: N/A    Number of Children: N/A  . Years of Education: N/A   Occupational History  . disabled    Social History Main Topics  . Smoking status: Former Smoker -- 1.00 packs/day for 20 years    Quit date: 06/10/2010  . Smokeless tobacco: Former Neurosurgeon     Comment: Quit in 06/2010  . Alcohol Use: 0.5 oz/week    1 drink(s) per week     Comment: hx of ETOH abuse, none since hospitalization in December 2013   . Drug Use: Yes    Special: Marijuana     Comment: history of marijuna use, none since December 2013   . Sexual Activity: No   Other Topics Concern  . Not on file   Social History Narrative  . No narrative on file    Review of Systems: Gen: Denies fever, chills, loss of appetite, change in weight or weight loss CV: Denies chest pain Resp: +DOE, +coughing GI: see HPI GU : Denies urinary burning, urinary frequency, urinary incontinence.  MS: +joint pain  Derm: Denies rash, itching, dry skin Psych: Denies depression, anxiety,confusion, or memory loss Heme: Denies  bruising, bleeding, and enlarged lymph nodes.  Physical Exam: Vital signs in last 24 hours: Temp:  [98.8 F (37.1 C)-99.2 F (37.3 C)] 98.8 F (37.1 C) (10/27 0434) Pulse Rate:  [94-131] 94 (10/27 0434) Resp:  [18-21] 18 (10/27 0434) BP: (121-138)/(69-73) 121/70 mmHg (10/27 0434) SpO2:  [95 %-100 %] 98 % (10/27 0745) FiO2 (%):  [30 %] 30 % (10/26 2149) Weight:  [220 lb 14.4 oz (100.2 kg)-221 lb 5.5 oz (100.4 kg)] 220 lb 14.4 oz (100.2 kg) (10/26 2145) Last BM Date: 01/03/13 General:   Alert,  Well-developed, well-nourished, pleasant and cooperative in NAD Head:  Normocephalic and atraumatic. Eyes:  Sclera clear, no icterus.    Ears:  Normal auditory acuity. Nose:  No deformity, discharge,  or lesions. Mouth:  No deformity or lesions Lungs:  Scattered rhonchi, mild wheeze bilaterally Heart:  S1 S2 present Abdomen:  Distended but soft, rounded, nontender. Difficult to appreciate HSM due to large body habitus.  Rectal:  Benign external exam, patient would not let me complete an internal exam due to discomfort. Incomplete internal exam.  Msk:  Symmetrical without gross deformities. Normal posture. Extremities:  Without clubbing or edema. Neurologic:  Alert and  oriented x4;  grossly normal neurologically. Skin:  Intact without significant lesions or rashes. Cervical Nodes:  No significant cervical adenopathy. Psych:  Alert and cooperative. Normal mood and affect.  Intake/Output from previous day: 10/26 0701 - 10/27 0700 In: 878.5 [I.V.:578.5; IV Piggyback:300] Out: 1150 [Urine:1150] Intake/Output this shift:    Lab Results:  Recent Labs  01/03/13 1846 01/04/13 0509  WBC 4.5 3.6*  HGB 11.4* 10.9*  HCT 34.1* 33.4*  PLT 261 256   BMET  Recent Labs  01/03/13 1846 01/04/13 0509  NA 140 140  K 2.8* 3.9  CL 95* 100  CO2 36* 33*  GLUCOSE 106* 176*  BUN 8 9  CREATININE 1.29 1.21  CALCIUM 9.7 9.1   LFT  Recent Labs  01/04/13 0509  PROT 6.7  ALBUMIN 3.2*  AST  19  ALT 17  ALKPHOS 58  BILITOT 0.5    Studies/Results: Nm Pulmonary Perf And Vent  01/04/2013   CLINICAL DATA:  Difficulty breathing  EXAM: NUCLEAR MEDICINE VENTILATION - PERFUSION LUNG SCAN  COMPARISON:  Chest radiograph January 03, 2013  FINDINGS: On the ventilation study, there is patchy decreased uptake of radiotracer in portions of both upper lobes consistent with the underlying emphysema.  On the perfusion study, there are matching defects in both upper lobes, particularly in the right upper lobe consistent with the bullous emphysematous change seen on the chest radiograph.  There is no appreciable ventilation/ perfusion mismatch.  IMPRESSION: Underlying emphysema with matching ventilation and perfusion defects in both upper lobes. There is a bulla in the right upper lobe with diminished ventilation and perfusion in a matching pattern. There is no appreciable ventilation/ perfusion mismatch. This combination of findings is consistent with a low probability of pulmonary embolism in the face of underlying emphysematous change.  VIEWS:  ANTERIOR, POSTERIOR, LEFT LATERAL, RIGHT LATERAL, RPO, LPO, RAO, LAO -VENTILATION AND PERFUSION: VIEWS: ANTERIOR, POSTERIOR, LEFT LATERAL, RIGHT LATERAL, RPO, LPO, RAO, LAO -VENTILATION AND PERFUSION  Radionuclide: Technetium 4m DTPA -ventilation; Technetium 61m macro aggregated albumin-perfusion  Dose: 40.0 mCi-ventilation; 6.0 mCi -perfusion  Route of administration: Inhalation -ventilation; intravenous -perfusion   Electronically Signed   By: Bretta Bang M.D.   On: 01/04/2013 08:05   Dg Chest Portable 1 View  01/03/2013   CLINICAL DATA:  Shortness of breath.  EXAM: PORTABLE CHEST - 1 VIEW  COMPARISON:  November 28, 2012.  FINDINGS: The heart size and mediastinal contours are within normal limits. Both lungs are clear. The visualized skeletal structures are unremarkable.  IMPRESSION: No active disease.   Electronically Signed   By: Roque Lias M.D.   On:  01/03/2013 19:13    Impression: 57 year old male with history of GERD, dysphagia s/p dilation of probable cervical esophageal web, constipation, and known internal hemorrhoids, now with low-volume hematochezia and recurrent dysphagia.   Anemia at baseline, likely multifactorial but doubt significant GI contributor. Low-volume hematochezia likely secondary to known internal hemorrhoids in the setting of constipation/straining. Needs more aggressive management of constipation, supportive measures for hemorrhoids, and he would likely benefit from outpatient banding procedure for hemorrhoids after hospitalization. Colonoscopy up-to-date.   Dysphagia, in the setting of chronic GERD, which seems to be related to dietary intake per patient's reports. Unable to exclude underlying motility disorder or possible need for repeat EGD with dilation. Will proceed with a barium pill esophagram to help sort out further. If need for dilation, would recommend as outpatient electively.   Plan: BPE today Protonix BID for now Anusol suppositories BID X 7-10 days Outpatient banding procedure Consider outpatient dilation if necessary after review of BPE Monitor for any further overt GI bleeding, drop in Hgb  Nira Retort, ANP-BC [facility]     LOS: 1 day    01/04/2013, 8:13 AM    Addendum: start Linzess 145 mcg daily for constipation as outpatient. For now, Miralax once daily.

## 2013-01-04 NOTE — Progress Notes (Signed)
Subjective: He says he feels better. He says part of the problem was that he had been taking prednisone twice a day instead of once a day as it was ordered. He ran out and thinks that gave him some trouble. He has been having blood in his stool. He has been tachycardic. He says he is better now.  Objective: Vital signs in last 24 hours: Temp:  [98.8 F (37.1 C)-99.2 F (37.3 C)] 98.8 F (37.1 C) (10/27 0434) Pulse Rate:  [94-131] 94 (10/27 0434) Resp:  [18-21] 18 (10/27 0434) BP: (121-138)/(69-73) 121/70 mmHg (10/27 0434) SpO2:  [95 %-100 %] 98 % (10/27 0745) FiO2 (%):  [30 %] 30 % (10/26 2149) Weight:  [100.2 kg (220 lb 14.4 oz)-100.4 kg (221 lb 5.5 oz)] 100.2 kg (220 lb 14.4 oz) (10/26 2145) Weight change:  Last BM Date: 01/03/13  Intake/Output from previous day: 10/26 0701 - 10/27 0700 In: 878.5 [I.V.:578.5; IV Piggyback:300] Out: 1150 [Urine:1150]  PHYSICAL EXAM General appearance: alert, cooperative and mild distress Resp: clear to auscultation bilaterally Cardio: regular rate and rhythm, S1, S2 normal, no murmur, click, rub or gallop GI: soft, non-tender; bowel sounds normal; no masses,  no organomegaly Extremities: extremities normal, atraumatic, no cyanosis or edema  Lab Results:    Basic Metabolic Panel:  Recent Labs  78/46/96 1846 01/04/13 0509  NA 140 140  K 2.8* 3.9  CL 95* 100  CO2 36* 33*  GLUCOSE 106* 176*  BUN 8 9  CREATININE 1.29 1.21  CALCIUM 9.7 9.1   Liver Function Tests:  Recent Labs  01/04/13 0509  AST 19  ALT 17  ALKPHOS 58  BILITOT 0.5  PROT 6.7  ALBUMIN 3.2*   No results found for this basename: LIPASE, AMYLASE,  in the last 72 hours No results found for this basename: AMMONIA,  in the last 72 hours CBC:  Recent Labs  01/03/13 1846 01/04/13 0509  WBC 4.5 3.6*  NEUTROABS 3.0  --   HGB 11.4* 10.9*  HCT 34.1* 33.4*  MCV 91.7 92.0  PLT 261 256   Cardiac Enzymes:  Recent Labs  01/03/13 1846  TROPONINI <0.30    BNP:  Recent Labs  01/03/13 1846  PROBNP 787.4*   D-Dimer: No results found for this basename: DDIMER,  in the last 72 hours CBG: No results found for this basename: GLUCAP,  in the last 72 hours Hemoglobin A1C: No results found for this basename: HGBA1C,  in the last 72 hours Fasting Lipid Panel: No results found for this basename: CHOL, HDL, LDLCALC, TRIG, CHOLHDL, LDLDIRECT,  in the last 72 hours Thyroid Function Tests: No results found for this basename: TSH, T4TOTAL, FREET4, T3FREE, THYROIDAB,  in the last 72 hours Anemia Panel: No results found for this basename: VITAMINB12, FOLATE, FERRITIN, TIBC, IRON, RETICCTPCT,  in the last 72 hours Coagulation: No results found for this basename: LABPROT, INR,  in the last 72 hours Urine Drug Screen: Drugs of Abuse     Component Value Date/Time   LABOPIA NONE DETECTED 03/01/2012 0019   COCAINSCRNUR POSITIVE* 03/01/2012 0019   LABBENZ NONE DETECTED 03/01/2012 0019   AMPHETMU NONE DETECTED 03/01/2012 0019   THCU NONE DETECTED 03/01/2012 0019   LABBARB NONE DETECTED 03/01/2012 0019    Alcohol Level: No results found for this basename: ETH,  in the last 72 hours Urinalysis: No results found for this basename: COLORURINE, APPERANCEUR, LABSPEC, PHURINE, GLUCOSEU, HGBUR, BILIRUBINUR, KETONESUR, PROTEINUR, UROBILINOGEN, NITRITE, LEUKOCYTESUR,  in the last 72 hours Misc.  Labs:  ABGS No results found for this basename: PHART, PCO2, PO2ART, TCO2, HCO3,  in the last 72 hours CULTURES No results found for this or any previous visit (from the past 240 hour(s)). Studies/Results: Nm Pulmonary Perf And Vent  01/04/2013   CLINICAL DATA:  Difficulty breathing  EXAM: NUCLEAR MEDICINE VENTILATION - PERFUSION LUNG SCAN  COMPARISON:  Chest radiograph January 03, 2013  FINDINGS: On the ventilation study, there is patchy decreased uptake of radiotracer in portions of both upper lobes consistent with the underlying emphysema.  On the perfusion  study, there are matching defects in both upper lobes, particularly in the right upper lobe consistent with the bullous emphysematous change seen on the chest radiograph.  There is no appreciable ventilation/ perfusion mismatch.  IMPRESSION: Underlying emphysema with matching ventilation and perfusion defects in both upper lobes. There is a bulla in the right upper lobe with diminished ventilation and perfusion in a matching pattern. There is no appreciable ventilation/ perfusion mismatch. This combination of findings is consistent with a low probability of pulmonary embolism in the face of underlying emphysematous change.  VIEWS:  ANTERIOR, POSTERIOR, LEFT LATERAL, RIGHT LATERAL, RPO, LPO, RAO, LAO -VENTILATION AND PERFUSION: VIEWS: ANTERIOR, POSTERIOR, LEFT LATERAL, RIGHT LATERAL, RPO, LPO, RAO, LAO -VENTILATION AND PERFUSION  Radionuclide: Technetium 74m DTPA -ventilation; Technetium 46m macro aggregated albumin-perfusion  Dose: 40.0 mCi-ventilation; 6.0 mCi -perfusion  Route of administration: Inhalation -ventilation; intravenous -perfusion   Electronically Signed   By: Bretta Bang M.D.   On: 01/04/2013 08:05   Dg Chest Portable 1 View  01/03/2013   CLINICAL DATA:  Shortness of breath.  EXAM: PORTABLE CHEST - 1 VIEW  COMPARISON:  November 28, 2012.  FINDINGS: The heart size and mediastinal contours are within normal limits. Both lungs are clear. The visualized skeletal structures are unremarkable.  IMPRESSION: No active disease.   Electronically Signed   By: Roque Lias M.D.   On: 01/03/2013 19:13    Medications:  Prior to Admission:  Prescriptions prior to admission  Medication Sig Dispense Refill  . ADVAIR DISKUS 250-50 MCG/DOSE AEPB Inhale 1 puff into the lungs 2 (two) times daily.       Marland Kitchen albuterol (PROVENTIL HFA;VENTOLIN HFA) 108 (90 BASE) MCG/ACT inhaler Inhale 2 puffs into the lungs as needed for wheezing (rescue inhaler).       . cloNIDine (CATAPRES) 0.2 MG tablet Take 0.2 mg by  mouth 3 (three) times daily.      . fluticasone (CUTIVATE) 0.05 % cream       . Fluticasone Furoate-Vilanterol (BREO ELLIPTA) 100-25 MCG/INH AEPB Inhale 1 puff into the lungs daily as needed (Uses when he doesn't have Advair Inhaler.).      Marland Kitchen furosemide (LASIX) 40 MG tablet Take 1 tablet (40 mg total) by mouth 2 (two) times daily.  30 tablet  5  . guaiFENesin (MUCINEX) 600 MG 12 hr tablet Take 1,200 mg by mouth 2 (two) times daily.      Marland Kitchen HYDROcodone-acetaminophen (NORCO/VICODIN) 5-325 MG per tablet Take 1 tablet by mouth every 6 (six) hours as needed for pain.  120 tablet  0  . lisinopril (PRINIVIL,ZESTRIL) 40 MG tablet Take 40 mg by mouth daily.       . methotrexate (RHEUMATREX) 2.5 MG tablet Take 10 mg by mouth once a week. Sunday      . pantoprazole (PROTONIX) 40 MG tablet Take 40 mg by mouth daily.       . predniSONE (DELTASONE) 10 MG tablet Take  10 mg by mouth daily.      . ranitidine (ZANTAC) 300 MG tablet Take 300 mg by mouth at bedtime.       Scheduled: . antiseptic oral rinse  15 mL Mouth Rinse BID  . cloNIDine  0.2 mg Oral TID  . docusate sodium  100 mg Oral BID  . furosemide  40 mg Intravenous Q12H  . guaiFENesin  1,200 mg Oral BID  . influenza vac split quadrivalent PF  0.5 mL Intramuscular Tomorrow-1000  . ipratropium  0.5 mg Nebulization Q4H  . levalbuterol  0.63 mg Nebulization Q4H  . methylPREDNISolone (SOLU-MEDROL) injection  40 mg Intravenous Q6H  . pantoprazole  40 mg Oral Daily  . potassium chloride  40 mEq Oral Daily  . sodium chloride  3 mL Intravenous Q12H  . sodium chloride  3 mL Intravenous Q12H   Continuous:  WUJ:[id number] chloride, HYDROcodone-acetaminophen, ondansetron (ZOFRAN) IV, sodium chloride  Assesment: He was admitted with COPD exacerbation blood in his stool and he has chronic diastolic heart failure. He was tachycardic in the emergency room. I don't think he has acute congestive heart failure although his pro BNP is up but this is in the range of where  he has been in the past. Active Problems:   COPD exacerbation   Chronic diastolic HF (heart failure)   Hypertension    Plan: Continue with current treatments. He will have GI consultation.    LOS: 1 day   Jefry Lesinski L 01/04/2013, 8:50 AM

## 2013-01-04 NOTE — Care Management Note (Addendum)
    Page 1 of 2   01/05/2013     12:04:39 PM   CARE MANAGEMENT NOTE 01/05/2013  Patient:  [name], [name]   Account Number:  [id number]  Date Initiated:  01/04/2013  Documentation initiated by:  Sharrie Rothman  Subjective/Objective Assessment:   Pt admitted from home with COPD and CHF. Pt lives alone but has a CAP aide m-F, 3 hours a day and 2 hours on the weekend. Pt is also active with [facility] and [facility]. Pt has shower chair, cane, home O2, and neb machine. Pt stated that he does well     Action/Plan:   when he is alone. CM will arrange resumption of  HH at discharge. No other CM needs noted.   Anticipated DC Date:  01/07/2013   Anticipated DC Plan:  HOME W HOME HEALTH SERVICES      DC Planning Services  CM consult      [facility] Choice  Resumption Of Svcs/PTA Provider   Choice offered to / List presented to:  C-1 Patient        HH arranged  HH-1 RN      [facility] agency  [facility]   Status of service:  Completed, signed off Medicare Important Message given?  NA - LOS <3 / Initial given by admissions (If response is "NO", the following Medicare IM given date fields will be blank) Date Medicare IM given:   Date Additional Medicare IM given:    Discharge Disposition:  HOME W HOME HEALTH SERVICES  Per UR Regulation:    If discussed at Long Length of Stay Meetings, dates discussed:    Comments:  01/05/13 1200 Arlyss Queen, RN BSN CM Pt discharged home today with resumption of [facility] RN. Alroy Bailiff of [facility] is aware and will collect the pts information from the chart. HH services to start within 48 hours. No DME needs noted. Pt and pts nurse aware of discharge arrangements.  01/04/13 1450 Arlyss Queen, RN BSN CM

## 2013-01-05 ENCOUNTER — Telehealth: Payer: Self-pay | Admitting: Gastroenterology

## 2013-01-05 MED ORDER — MOMETASONE FURO-FORMOTEROL FUM 200-5 MCG/ACT IN AERO: 2.0000 | INHALATION_SPRAY | Freq: Two times a day (BID) | RESPIRATORY_TRACT | Status: AC

## 2013-01-05 MED ORDER — DSS 100 MG PO CAPS: 100.0000 mg | ORAL_CAPSULE | Freq: Two times a day (BID) | ORAL | Status: DC

## 2013-01-05 MED ORDER — HYDROCORTISONE ACETATE 25 MG RE SUPP: 25.0000 mg | Freq: Two times a day (BID) | RECTAL | Status: DC

## 2013-01-05 MED ORDER — PREDNISONE 10 MG PO TABS: 20.0000 mg | ORAL_TABLET | Freq: Every day | ORAL | Status: DC

## 2013-01-05 MED ORDER — PANTOPRAZOLE SODIUM 40 MG PO TBEC: 40.0000 mg | DELAYED_RELEASE_TABLET | Freq: Two times a day (BID) | ORAL | Status: AC

## 2013-01-05 MED ORDER — MOMETASONE FURO-FORMOTEROL FUM 200-5 MCG/ACT IN AERO
2.0000 | INHALATION_SPRAY | Freq: Two times a day (BID) | RESPIRATORY_TRACT | Status: DC
  Administered 2013-01-05: 2 via RESPIRATORY_TRACT
  Filled 2013-01-05: qty 8.8

## 2013-01-05 NOTE — Discharge Planning (Addendum)
Pt stated that he was ready to go home and pain he had no pain.  Pt's IV and tele were removed and he was given DC papers and educated on future FU appointments and S/Sx which may give the need to call the doctor or return to the hospital.  Pt educated on CHF and given pamphlet and education in DC papers.  Pt stated he currently weighs self at home and understands the importance. Pt told of needed FU appointments with GI and Cardiac doctors.Pt will be wheeled to car when ready and family arrives.

## 2013-01-05 NOTE — Telephone Encounter (Signed)
Please have patient return to see Dr. Jena Gauss in about  3-4 weeks for hemorrhoid banding as outpatient.

## 2013-01-05 NOTE — Progress Notes (Signed)
Subjective: No further rectal bleeding. BM yesterday evening per patient. Can't get mucus to come out of throat. Eating pancakes and sausage for breakfast.   Objective: Vital signs in last 24 hours: Temp:  [98.1 F (36.7 C)-98.4 F (36.9 C)] 98.1 F (36.7 C) (10/28 0444) Pulse Rate:  [92-93] 92 (10/28 0444) Resp:  [18-20] 20 (10/28 0444) BP: (97-109)/(45-60) 109/57 mmHg (10/28 0444) SpO2:  [94 %-98 %] 94 % (10/28 0718) Last BM Date: 01/03/13 General:   Alert and oriented, pleasant Head:  Normocephalic and atraumatic. Eyes:  No icterus, sclera clear. Conjuctiva pink.  Heart:  S1, S2 present, no murmurs noted.  Lungs: Clear to auscultation bilaterally Abdomen:  Bowel sounds present, round but non-distended, non-tender.  Msk:  Symmetrical without gross deformities. Normal posture. Pulses:  Normal pulses noted. Extremities:  Without clubbing or edema. Psych:  Alert and cooperative. Normal mood and affect.  Intake/Output from previous day: 10/27 0701 - 10/28 0700 In: 467.5 [P.O.:240; I.V.:227.5] Out: 1800 [Urine:1800] Intake/Output this shift:    Lab Results:  Recent Labs  01/03/13 1846 01/04/13 0509  WBC 4.5 3.6*  HGB 11.4* 10.9*  HCT 34.1* 33.4*  PLT 261 256   BMET  Recent Labs  01/03/13 1846 01/04/13 0509  NA 140 140  K 2.8* 3.9  CL 95* 100  CO2 36* 33*  GLUCOSE 106* 176*  BUN 8 9  CREATININE 1.29 1.21  CALCIUM 9.7 9.1   LFT  Recent Labs  01/04/13 0509  PROT 6.7  ALBUMIN 3.2*  AST 19  ALT 17  ALKPHOS 58  BILITOT 0.5     Studies/Results: Dg Esophagus  01/04/2013   CLINICAL DATA:  Dysphagia. Mid epigastric pain and esophageal pain after meals. History of dilatation earlier this year.  EXAM: ESOPHOGRAM / BARIUM SWALLOW / BARIUM TABLET STUDY  TECHNIQUE: Combined double contrast and single contrast examination performed using effervescent crystals, thick barium liquid, and thin barium liquid. The patient was observed with fluoroscopy swallowing a  13mm barium sulphate tablet.  COMPARISON:  None.  FLUOROSCOPY TIME:  1 Min and 18 seconds.  FINDINGS: Swallowing mechanism is normal. There is poor primary esophageal peristalsis. No esophageal fold thickening, stricture or obstruction. A 13 mm barium pill passed into the stomach without difficulty.  IMPRESSION: Esophageal dysmotility.   Electronically Signed   By: Leanna Battles M.D.   On: 01/04/2013 15:47   Nm Pulmonary Perf And Vent  01/04/2013   CLINICAL DATA:  Difficulty breathing  EXAM: NUCLEAR MEDICINE VENTILATION - PERFUSION LUNG SCAN  COMPARISON:  Chest radiograph January 03, 2013  FINDINGS: On the ventilation study, there is patchy decreased uptake of radiotracer in portions of both upper lobes consistent with the underlying emphysema.  On the perfusion study, there are matching defects in both upper lobes, particularly in the right upper lobe consistent with the bullous emphysematous change seen on the chest radiograph.  There is no appreciable ventilation/ perfusion mismatch.  IMPRESSION: Underlying emphysema with matching ventilation and perfusion defects in both upper lobes. There is a bulla in the right upper lobe with diminished ventilation and perfusion in a matching pattern. There is no appreciable ventilation/ perfusion mismatch. This combination of findings is consistent with a low probability of pulmonary embolism in the face of underlying emphysematous change.  VIEWS:  ANTERIOR, POSTERIOR, LEFT LATERAL, RIGHT LATERAL, RPO, LPO, RAO, LAO -VENTILATION AND PERFUSION: VIEWS: ANTERIOR, POSTERIOR, LEFT LATERAL, RIGHT LATERAL, RPO, LPO, RAO, LAO -VENTILATION AND PERFUSION  Radionuclide: Technetium 58m DTPA -ventilation; Technetium 73m macro  aggregated albumin-perfusion  Dose: 40.0 mCi-ventilation; 6.0 mCi -perfusion  Route of administration: Inhalation -ventilation; intravenous -perfusion   Electronically Signed   By: Bretta Bang M.D.   On: 01/04/2013 08:05   Dg Chest Portable 1  View  01/03/2013   CLINICAL DATA:  Shortness of breath.  EXAM: PORTABLE CHEST - 1 VIEW  COMPARISON:  November 28, 2012.  FINDINGS: The heart size and mediastinal contours are within normal limits. Both lungs are clear. The visualized skeletal structures are unremarkable.  IMPRESSION: No active disease.   Electronically Signed   By: Roque Lias M.D.   On: 01/03/2013 19:13    Assessment: 57 year old male with rectal bleeding likely secondary to known internal hemorrhoids, now resolved since admission and starting Anusol suppositories yesterday.  Dysphagia s/p EGD in Feb 2014 with BPE this admission noting esophageal motility disorder but no obstruction; patient tolerating diet.  From a GI standpoint, will sign off and see patient in our office for further assessment and hemorrhoid banding with Dr. Jena Gauss.   Plan: PPI BID Dysphagia 3 diet Anusol per rectum BID X 7 days Outpatient hemorrhoid banding Signing off.  Thank you for the opportunity to see patient during this admission.  Nira Retort, ANP-BC [facility]     LOS: 2 days    01/05/2013, 7:52 AM

## 2013-01-05 NOTE — Progress Notes (Signed)
REVIEWED.  

## 2013-01-05 NOTE — Progress Notes (Signed)
Patient requesting [name] order by MD (patient takes this at home).

## 2013-01-05 NOTE — Progress Notes (Signed)
Subjective: He feels well. He has no new complaints. His breathing is about at baseline. He still has some trouble with not being able to produce mucus and I'm going to have him try a flutter valve. He wants to go home  Objective: Vital signs in last 24 hours: Temp:  [98.1 F (36.7 C)-98.4 F (36.9 C)] 98.1 F (36.7 C) (10/28 0444) Pulse Rate:  [92-93] 92 (10/28 0444) Resp:  [18-20] 20 (10/28 0444) BP: (97-109)/(45-60) 109/57 mmHg (10/28 0444) SpO2:  [94 %-98 %] 94 % (10/28 0718) Weight change:  Last BM Date: 01/03/13  Intake/Output from previous day: 10/27 0701 - 10/28 0700 In: 467.5 [P.O.:240; I.V.:227.5] Out: 1800 [Urine:1800]  PHYSICAL EXAM General appearance: alert, cooperative and mild distress Resp: clear to auscultation bilaterally Cardio: regular rate and rhythm, S1, S2 normal, no murmur, click, rub or gallop GI: soft, non-tender; bowel sounds normal; no masses,  no organomegaly Extremities: extremities normal, atraumatic, no cyanosis or edema  Lab Results:    Basic Metabolic Panel:  Recent Labs  29/56/21 1846 01/04/13 0509  NA 140 140  K 2.8* 3.9  CL 95* 100  CO2 36* 33*  GLUCOSE 106* 176*  BUN 8 9  CREATININE 1.29 1.21  CALCIUM 9.7 9.1   Liver Function Tests:  Recent Labs  01/04/13 0509  AST 19  ALT 17  ALKPHOS 58  BILITOT 0.5  PROT 6.7  ALBUMIN 3.2*   No results found for this basename: LIPASE, AMYLASE,  in the last 72 hours No results found for this basename: AMMONIA,  in the last 72 hours CBC:  Recent Labs  01/03/13 1846 01/04/13 0509  WBC 4.5 3.6*  NEUTROABS 3.0  --   HGB 11.4* 10.9*  HCT 34.1* 33.4*  MCV 91.7 92.0  PLT 261 256   Cardiac Enzymes:  Recent Labs  01/03/13 1846  TROPONINI <0.30   BNP:  Recent Labs  01/03/13 1846  PROBNP 787.4*   D-Dimer: No results found for this basename: DDIMER,  in the last 72 hours CBG: No results found for this basename: GLUCAP,  in the last 72 hours Hemoglobin A1C: No  results found for this basename: HGBA1C,  in the last 72 hours Fasting Lipid Panel: No results found for this basename: CHOL, HDL, LDLCALC, TRIG, CHOLHDL, LDLDIRECT,  in the last 72 hours Thyroid Function Tests: No results found for this basename: TSH, T4TOTAL, FREET4, T3FREE, THYROIDAB,  in the last 72 hours Anemia Panel: No results found for this basename: VITAMINB12, FOLATE, FERRITIN, TIBC, IRON, RETICCTPCT,  in the last 72 hours Coagulation: No results found for this basename: LABPROT, INR,  in the last 72 hours Urine Drug Screen: Drugs of Abuse     Component Value Date/Time   LABOPIA NONE DETECTED 03/01/2012 0019   COCAINSCRNUR POSITIVE* 03/01/2012 0019   LABBENZ NONE DETECTED 03/01/2012 0019   AMPHETMU NONE DETECTED 03/01/2012 0019   THCU NONE DETECTED 03/01/2012 0019   LABBARB NONE DETECTED 03/01/2012 0019    Alcohol Level: No results found for this basename: ETH,  in the last 72 hours Urinalysis: No results found for this basename: COLORURINE, APPERANCEUR, LABSPEC, PHURINE, GLUCOSEU, HGBUR, BILIRUBINUR, KETONESUR, PROTEINUR, UROBILINOGEN, NITRITE, LEUKOCYTESUR,  in the last 72 hours Misc. Labs:  ABGS No results found for this basename: PHART, PCO2, PO2ART, TCO2, HCO3,  in the last 72 hours CULTURES No results found for this or any previous visit (from the past 240 hour(s)). Studies/Results: Dg Esophagus  01/04/2013   CLINICAL DATA:  Dysphagia. Mid epigastric  pain and esophageal pain after meals. History of dilatation earlier this year.  EXAM: ESOPHOGRAM / BARIUM SWALLOW / BARIUM TABLET STUDY  TECHNIQUE: Combined double contrast and single contrast examination performed using effervescent crystals, thick barium liquid, and thin barium liquid. The patient was observed with fluoroscopy swallowing a 13mm barium sulphate tablet.  COMPARISON:  None.  FLUOROSCOPY TIME:  1 Min and 18 seconds.  FINDINGS: Swallowing mechanism is normal. There is poor primary esophageal peristalsis. No  esophageal fold thickening, stricture or obstruction. A 13 mm barium pill passed into the stomach without difficulty.  IMPRESSION: Esophageal dysmotility.   Electronically Signed   By: Leanna Battles M.D.   On: 01/04/2013 15:47   Nm Pulmonary Perf And Vent  01/04/2013   CLINICAL DATA:  Difficulty breathing  EXAM: NUCLEAR MEDICINE VENTILATION - PERFUSION LUNG SCAN  COMPARISON:  Chest radiograph January 03, 2013  FINDINGS: On the ventilation study, there is patchy decreased uptake of radiotracer in portions of both upper lobes consistent with the underlying emphysema.  On the perfusion study, there are matching defects in both upper lobes, particularly in the right upper lobe consistent with the bullous emphysematous change seen on the chest radiograph.  There is no appreciable ventilation/ perfusion mismatch.  IMPRESSION: Underlying emphysema with matching ventilation and perfusion defects in both upper lobes. There is a bulla in the right upper lobe with diminished ventilation and perfusion in a matching pattern. There is no appreciable ventilation/ perfusion mismatch. This combination of findings is consistent with a low probability of pulmonary embolism in the face of underlying emphysematous change.  VIEWS:  ANTERIOR, POSTERIOR, LEFT LATERAL, RIGHT LATERAL, RPO, LPO, RAO, LAO -VENTILATION AND PERFUSION: VIEWS: ANTERIOR, POSTERIOR, LEFT LATERAL, RIGHT LATERAL, RPO, LPO, RAO, LAO -VENTILATION AND PERFUSION  Radionuclide: Technetium 54m DTPA -ventilation; Technetium 110m macro aggregated albumin-perfusion  Dose: 40.0 mCi-ventilation; 6.0 mCi -perfusion  Route of administration: Inhalation -ventilation; intravenous -perfusion   Electronically Signed   By: Bretta Bang M.D.   On: 01/04/2013 08:05   Dg Chest Portable 1 View  01/03/2013   CLINICAL DATA:  Shortness of breath.  EXAM: PORTABLE CHEST - 1 VIEW  COMPARISON:  November 28, 2012.  FINDINGS: The heart size and mediastinal contours are within normal  limits. Both lungs are clear. The visualized skeletal structures are unremarkable.  IMPRESSION: No active disease.   Electronically Signed   By: Roque Lias M.D.   On: 01/03/2013 19:13    Medications:  Prior to Admission:  Prescriptions prior to admission  Medication Sig Dispense Refill  . ADVAIR DISKUS 250-50 MCG/DOSE AEPB Inhale 1 puff into the lungs 2 (two) times daily.       Marland Kitchen albuterol (PROVENTIL HFA;VENTOLIN HFA) 108 (90 BASE) MCG/ACT inhaler Inhale 2 puffs into the lungs as needed for wheezing (rescue inhaler).       . cloNIDine (CATAPRES) 0.2 MG tablet Take 0.2 mg by mouth 3 (three) times daily.      . fluticasone (CUTIVATE) 0.05 % cream       . Fluticasone Furoate-Vilanterol (BREO ELLIPTA) 100-25 MCG/INH AEPB Inhale 1 puff into the lungs daily as needed (Uses when he doesn't have Advair Inhaler.).      Marland Kitchen furosemide (LASIX) 40 MG tablet Take 1 tablet (40 mg total) by mouth 2 (two) times daily.  30 tablet  5  . guaiFENesin (MUCINEX) 600 MG 12 hr tablet Take 1,200 mg by mouth 2 (two) times daily.      Marland Kitchen HYDROcodone-acetaminophen (NORCO/VICODIN) 5-325 MG per  tablet Take 1 tablet by mouth every 6 (six) hours as needed for pain.  120 tablet  0  . lisinopril (PRINIVIL,ZESTRIL) 40 MG tablet Take 40 mg by mouth daily.       . methotrexate (RHEUMATREX) 2.5 MG tablet Take 10 mg by mouth once a week. Sunday      . pantoprazole (PROTONIX) 40 MG tablet Take 40 mg by mouth daily.       . predniSONE (DELTASONE) 10 MG tablet Take 10 mg by mouth daily.      . ranitidine (ZANTAC) 300 MG tablet Take 300 mg by mouth at bedtime.       Scheduled: . antiseptic oral rinse  15 mL Mouth Rinse BID  . cloNIDine  0.2 mg Oral TID  . docusate sodium  100 mg Oral BID  . guaiFENesin  1,200 mg Oral BID  . hydrocortisone  25 mg Rectal BID  . ipratropium  0.5 mg Nebulization Q4H  . levalbuterol  0.63 mg Nebulization Q4H  . methylPREDNISolone (SOLU-MEDROL) injection  40 mg Intravenous Q6H  . pantoprazole  40 mg  Oral BID  . polyethylene glycol  17 g Oral Daily  . sodium chloride  3 mL Intravenous Q12H  . sodium chloride  3 mL Intravenous Q12H   Continuous:  ZOX:WRUEAV chloride, HYDROcodone-acetaminophen, sodium chloride  Assesment: He was admitted with hematochezia and a mild COPD exacerbation. He has chronic diastolic heart failure but does not seem to be having an acute exacerbation. He has hypertension which is stable. He has hemorrhoids which are being treated. He has improved and is back at baseline. Active Problems:   COPD exacerbation   Chronic diastolic HF (heart failure)   Hypertension    Plan: He will get a flutter valve. He will be discharged. Continue with home health services. He will have hemorrhoid banding in the GI office    LOS: 2 days   Jamerius Boeckman L 01/05/2013, 8:37 AM

## 2013-01-05 NOTE — Telephone Encounter (Signed)
01/27/13 with RMR at 3:15 hemorrhoid banding

## 2013-01-06 ENCOUNTER — Other Ambulatory Visit: Payer: Self-pay | Admitting: Orthopedic Surgery

## 2013-01-06 DIAGNOSIS — M25569 Pain in unspecified knee: Secondary | ICD-10-CM

## 2013-01-06 MED ORDER — ACETAMINOPHEN-CODEINE #3 300-30 MG PO TABS: 1.0000 | ORAL_TABLET | ORAL | Status: AC | PRN

## 2013-01-06 NOTE — Telephone Encounter (Signed)
tyl 3 prescribed

## 2013-01-06 NOTE — Telephone Encounter (Signed)
Patient advised to pick up the prescription

## 2013-01-07 NOTE — Discharge Summary (Signed)
Physician Discharge Summary  Patient ID: [name] MRN: [id number] DOB/AGE: January 31, 1956 57 y.o. Primary Care Physician:Chas Axel L, MD Admit date: 01/03/2013 Discharge date: 01/07/2013    Discharge Diagnoses:   Active Problems:   COPD exacerbation   Chronic diastolic HF (heart failure)   Hypertension  chronic renal failure Trivial GI bleeding Hemorrhoids Anemia Dysphagia   Medication List    STOP taking these medications       ADVAIR DISKUS 250-50 MCG/DOSE Aepb  Generic drug:  Fluticasone-Salmeterol      TAKE these medications       albuterol 108 (90 BASE) MCG/ACT inhaler  Commonly known as:  PROVENTIL HFA;VENTOLIN HFA  Inhale 2 puffs into the lungs as needed for wheezing (rescue inhaler).     BREO ELLIPTA 100-25 MCG/INH Aepb  Generic drug:  Fluticasone Furoate-Vilanterol  Inhale 1 puff into the lungs daily as needed (Uses when he doesn't have Advair Inhaler.).     cloNIDine 0.2 MG tablet  Commonly known as:  CATAPRES  Take 0.2 mg by mouth 3 (three) times daily.     DSS 100 MG Caps  Take 100 mg by mouth 2 (two) times daily.     fluticasone 0.05 % cream  Commonly known as:  CUTIVATE     furosemide 40 MG tablet  Commonly known as:  LASIX  Take 1 tablet (40 mg total) by mouth 2 (two) times daily.     guaiFENesin 600 MG 12 hr tablet  Commonly known as:  MUCINEX  Take 1,200 mg by mouth 2 (two) times daily.     HYDROcodone-acetaminophen 5-325 MG per tablet  Commonly known as:  NORCO/VICODIN  Take 1 tablet by mouth every 6 (six) hours as needed for pain.     hydrocortisone 25 MG suppository  Commonly known as:  ANUSOL-HC  Place 1 suppository (25 mg total) rectally 2 (two) times daily.     lisinopril 40 MG tablet  Commonly known as:  PRINIVIL,ZESTRIL  Take 40 mg by mouth daily.     methotrexate 2.5 MG tablet  Commonly known as:  RHEUMATREX  Take 10 mg by mouth once a week. Sunday     mometasone-formoterol 200-5 MCG/ACT Aero  Commonly  known as:  DULERA  Inhale 2 puffs into the lungs 2 (two) times daily.     pantoprazole 40 MG tablet  Commonly known as:  PROTONIX  Take 1 tablet (40 mg total) by mouth 2 (two) times daily.     predniSONE 10 MG tablet  Commonly known as:  DELTASONE  Take 2 tablets (20 mg total) by mouth daily.     ranitidine 300 MG tablet  Commonly known as:  ZANTAC  Take 300 mg by mouth at bedtime.        Discharged Condition: Improved    Consults: GI  Significant Diagnostic Studies: Dg Esophagus  01/04/2013   CLINICAL DATA:  Dysphagia. Mid epigastric pain and esophageal pain after meals. History of dilatation earlier this year.  EXAM: ESOPHOGRAM / BARIUM SWALLOW / BARIUM TABLET STUDY  TECHNIQUE: Combined double contrast and single contrast examination performed using effervescent crystals, thick barium liquid, and thin barium liquid. The patient was observed with fluoroscopy swallowing a 13mm barium sulphate tablet.  COMPARISON:  None.  FLUOROSCOPY TIME:  1 Min and 18 seconds.  FINDINGS: Swallowing mechanism is normal. There is poor primary esophageal peristalsis. No esophageal fold thickening, stricture or obstruction. A 13 mm barium pill passed into the stomach without difficulty.  IMPRESSION: Esophageal dysmotility.  Electronically Signed   By: Leanna Battles M.D.   On: 01/04/2013 15:47   Nm Pulmonary Perf And Vent  01/04/2013   CLINICAL DATA:  Difficulty breathing  EXAM: NUCLEAR MEDICINE VENTILATION - PERFUSION LUNG SCAN  COMPARISON:  Chest radiograph January 03, 2013  FINDINGS: On the ventilation study, there is patchy decreased uptake of radiotracer in portions of both upper lobes consistent with the underlying emphysema.  On the perfusion study, there are matching defects in both upper lobes, particularly in the right upper lobe consistent with the bullous emphysematous change seen on the chest radiograph.  There is no appreciable ventilation/ perfusion mismatch.  IMPRESSION: Underlying  emphysema with matching ventilation and perfusion defects in both upper lobes. There is a bulla in the right upper lobe with diminished ventilation and perfusion in a matching pattern. There is no appreciable ventilation/ perfusion mismatch. This combination of findings is consistent with a low probability of pulmonary embolism in the face of underlying emphysematous change.  VIEWS:  ANTERIOR, POSTERIOR, LEFT LATERAL, RIGHT LATERAL, RPO, LPO, RAO, LAO -VENTILATION AND PERFUSION: VIEWS: ANTERIOR, POSTERIOR, LEFT LATERAL, RIGHT LATERAL, RPO, LPO, RAO, LAO -VENTILATION AND PERFUSION  Radionuclide: Technetium 69m DTPA -ventilation; Technetium 66m macro aggregated albumin-perfusion  Dose: 40.0 mCi-ventilation; 6.0 mCi -perfusion  Route of administration: Inhalation -ventilation; intravenous -perfusion   Electronically Signed   By: Bretta Bang M.D.   On: 01/04/2013 08:05   Dg Chest Portable 1 View  01/03/2013   CLINICAL DATA:  Shortness of breath.  EXAM: PORTABLE CHEST - 1 VIEW  COMPARISON:  November 28, 2012.  FINDINGS: The heart size and mediastinal contours are within normal limits. Both lungs are clear. The visualized skeletal structures are unremarkable.  IMPRESSION: No active disease.   Electronically Signed   By: Roque Lias M.D.   On: 01/03/2013 19:13    Lab Results: Basic Metabolic Panel: No results found for this basename: NA, K, CL, CO2, GLUCOSE, BUN, CREATININE, CALCIUM, MG, PHOS,  in the last 72 hours Liver Function Tests: No results found for this basename: AST, ALT, ALKPHOS, BILITOT, PROT, ALBUMIN,  in the last 72 hours   CBC: No results found for this basename: WBC, NEUTROABS, HGB, HCT, MCV, PLT,  in the last 72 hours  No results found for this or any previous visit (from the past 240 hour(s)).   Hospital Course: This is a 57 year old who came to the emergency department because of blood in his stool. When he was seen in the emergency department he was found to have some  respiratory distress and ended up being admitted because of that and the blood in his stool. He has a significant past medical history of COPD and chronic diastolic heart failure. His BNP was about at baseline and his chest x-ray did not show any definite abnormalities suggested he had any evidence of volume overload so this was felt this was mostly COPD exacerbation and less problem with his heart. He was given IV steroids and antibiotics. He had GI consultation and it was felt that this was likely trivial bleeding from hemorrhoids. Plan will be to have banding of hemorrhoids in the office. He complained of some trouble swallowing and has had a previous esophageal dilatation and that may need to be redone as an outpatient.  Discharge Exam: Blood pressure 115/62, pulse 92, temperature 98.1 F (36.7 C), temperature source Oral, resp. rate 20, height 5\' 9"  (1.753 m), weight 100.2 kg (220 lb 14.4 oz), SpO2 94.00%. He is awake and alert  and looks comfortable. His chest is clear. His heart is regular. He has no edema. Central nervous system exam is grossly intact  Disposition: Home with home health services      Discharge Orders   Future Appointments Provider Department Dept Phone   01/27/2013 3:15 PM Corbin Ade, MD [facility] [phone]   02/17/2013 11:20 AM Laqueta Linden, MD [facility] [phone]   Future Orders Complete By Expires   Face-to-face encounter (required for Medicare/Medicaid patients)  As directed    Comments:     I Tynisha Ogan L certify that this patient is under my care and that I, or a nurse practitioner or physician's assistant working with me, had a face-to-face encounter that meets the physician face-to-face encounter requirements with this patient on 01/05/2013. The encounter with the patient was in whole, or in part for the following medical condition(s) which is the primary reason for home health care (List medical  condition): copd   Questions:     The encounter with the patient was in whole, or in part, for the following medical condition, which is the primary reason for home health care:  copd   I certify that, based on my findings, the following services are medically necessary home health services:  Nursing   My clinical findings support the need for the above services:  Shortness of breath with activity   Further, I certify that my clinical findings support that this patient is homebound due to:  Shortness of Breath with activity   Reason for Medically Necessary Home Health Services:  Skilled Nursing- Change/Decline in Patient Status   Home Health  As directed    Questions:     To provide the following care/treatments:  RN      Follow-up Information   Follow up with [facility].   Contact information:   [address] [place] [address] [phone]      Signed: Fredirick Maudlin Pager [phone]  01/07/2013, 8:29 AM

## 2013-01-15 ENCOUNTER — Encounter: Payer: Self-pay | Admitting: Internal Medicine

## 2013-01-16 ENCOUNTER — Inpatient Hospital Stay (HOSPITAL_COMMUNITY)
Admission: EM | Admit: 2013-01-16 | Discharge: 2013-01-19 | DRG: 191 | Disposition: A | Discharge status: Home Health | Payer: Medicare HMO | Attending: Family Medicine | Admitting: Family Medicine

## 2013-01-16 ENCOUNTER — Emergency Department (HOSPITAL_COMMUNITY): Payer: Medicare HMO

## 2013-01-16 ENCOUNTER — Encounter (HOSPITAL_COMMUNITY): Payer: Self-pay | Admitting: Emergency Medicine

## 2013-01-16 DIAGNOSIS — K224 Dyskinesia of esophagus: Secondary | ICD-10-CM | POA: Diagnosis present

## 2013-01-16 DIAGNOSIS — R600 Localized edema: Secondary | ICD-10-CM | POA: Diagnosis present

## 2013-01-16 DIAGNOSIS — I509 Heart failure, unspecified: Secondary | ICD-10-CM | POA: Diagnosis present

## 2013-01-16 DIAGNOSIS — I129 Hypertensive chronic kidney disease with stage 1 through stage 4 chronic kidney disease, or unspecified chronic kidney disease: Secondary | ICD-10-CM | POA: Diagnosis present

## 2013-01-16 DIAGNOSIS — G4730 Sleep apnea, unspecified: Secondary | ICD-10-CM | POA: Diagnosis present

## 2013-01-16 DIAGNOSIS — L408 Other psoriasis: Secondary | ICD-10-CM | POA: Diagnosis present

## 2013-01-16 DIAGNOSIS — Z808 Family history of malignant neoplasm of other organs or systems: Secondary | ICD-10-CM

## 2013-01-16 DIAGNOSIS — Z825 Family history of asthma and other chronic lower respiratory diseases: Secondary | ICD-10-CM

## 2013-01-16 DIAGNOSIS — I5032 Chronic diastolic (congestive) heart failure: Secondary | ICD-10-CM | POA: Diagnosis present

## 2013-01-16 DIAGNOSIS — E669 Obesity, unspecified: Secondary | ICD-10-CM | POA: Diagnosis present

## 2013-01-16 DIAGNOSIS — N182 Chronic kidney disease, stage 2 (mild): Secondary | ICD-10-CM | POA: Diagnosis present

## 2013-01-16 DIAGNOSIS — Z800 Family history of malignant neoplasm of digestive organs: Secondary | ICD-10-CM

## 2013-01-16 DIAGNOSIS — Z8249 Family history of ischemic heart disease and other diseases of the circulatory system: Secondary | ICD-10-CM

## 2013-01-16 DIAGNOSIS — Z9981 Dependence on supplemental oxygen: Secondary | ICD-10-CM

## 2013-01-16 DIAGNOSIS — Z87891 Personal history of nicotine dependence: Secondary | ICD-10-CM

## 2013-01-16 DIAGNOSIS — Z79899 Other long term (current) drug therapy: Secondary | ICD-10-CM

## 2013-01-16 DIAGNOSIS — J441 Chronic obstructive pulmonary disease with (acute) exacerbation: Principal | ICD-10-CM | POA: Diagnosis present

## 2013-01-16 DIAGNOSIS — R7989 Other specified abnormal findings of blood chemistry: Secondary | ICD-10-CM | POA: Diagnosis present

## 2013-01-16 DIAGNOSIS — J4489 Other specified chronic obstructive pulmonary disease: Secondary | ICD-10-CM | POA: Diagnosis present

## 2013-01-16 DIAGNOSIS — J449 Chronic obstructive pulmonary disease, unspecified: Secondary | ICD-10-CM | POA: Diagnosis present

## 2013-01-16 LAB — CBC
HCT: 33.3 % — ABNORMAL LOW (ref 39.0–52.0)
Hemoglobin: 10.8 g/dL — ABNORMAL LOW (ref 13.0–17.0)
MCH: 30.6 pg (ref 26.0–34.0)
MCHC: 32.4 g/dL (ref 30.0–36.0)
RDW: 14.7 % (ref 11.5–15.5)

## 2013-01-16 LAB — BASIC METABOLIC PANEL
BUN: 13 mg/dL (ref 6–23)
Creatinine, Ser: 1.07 mg/dL (ref 0.50–1.35)
GFR calc Af Amer: 87 mL/min — ABNORMAL LOW (ref >90)
GFR calc non Af Amer: 75 mL/min — ABNORMAL LOW (ref >90)
Glucose, Bld: 96 mg/dL (ref 70–99)
Potassium: 3.5 mEq/L (ref 3.5–5.1)

## 2013-01-16 LAB — MRSA PCR SCREENING: MRSA by PCR: NEGATIVE

## 2013-01-16 LAB — BLOOD GAS, ARTERIAL
Acid-Base Excess: 6.4 mmol/L — ABNORMAL HIGH (ref 0.0–2.0)
Bicarbonate: 31.2 mEq/L — ABNORMAL HIGH (ref 20.0–24.0)
TCO2: 28.8 mmol/L (ref 0–100)
pCO2 arterial: 51.9 mmHg — ABNORMAL HIGH (ref 35.0–45.0)
pH, Arterial: 7.396 (ref 7.350–7.450)
pO2, Arterial: 104 mmHg — ABNORMAL HIGH (ref 80.0–100.0)

## 2013-01-16 LAB — PRO B NATRIURETIC PEPTIDE: Pro B Natriuretic peptide (BNP): 2598 pg/mL — ABNORMAL HIGH (ref 0–125)

## 2013-01-16 LAB — TROPONIN I: Troponin I: <0.3 ng/mL (ref <0.30)

## 2013-01-16 MED ORDER — METHOTREXATE 2.5 MG PO TABS
10.0000 mg | ORAL_TABLET | ORAL | Status: DC
  Filled 2013-01-16: qty 4

## 2013-01-16 MED ORDER — METHYLPREDNISOLONE SODIUM SUCC 125 MG IJ SOLR
125.0000 mg | Freq: Once | INTRAMUSCULAR | Status: AC
  Administered 2013-01-16: 125 mg via INTRAVENOUS
  Filled 2013-01-16: qty 2

## 2013-01-16 MED ORDER — GUAIFENESIN ER 600 MG PO TB12
1200.0000 mg | ORAL_TABLET | Freq: Two times a day (BID) | ORAL | Status: DC
  Administered 2013-01-16 – 2013-01-19 (×6): 1200 mg via ORAL
  Filled 2013-01-16 (×6): qty 2

## 2013-01-16 MED ORDER — ALBUTEROL SULFATE (5 MG/ML) 0.5% IN NEBU
2.5000 mg | INHALATION_SOLUTION | RESPIRATORY_TRACT | Status: DC
  Administered 2013-01-16 (×2): 2.5 mg via RESPIRATORY_TRACT

## 2013-01-16 MED ORDER — METHYLPREDNISOLONE SODIUM SUCC 125 MG IJ SOLR
125.0000 mg | Freq: Four times a day (QID) | INTRAMUSCULAR | Status: DC
  Administered 2013-01-16 – 2013-01-19 (×11): 125 mg via INTRAVENOUS
  Filled 2013-01-16 (×11): qty 2

## 2013-01-16 MED ORDER — GI COCKTAIL ~~LOC~~
ORAL | Status: AC
  Filled 2013-01-16: qty 30

## 2013-01-16 MED ORDER — ASPIRIN 81 MG PO CHEW
324.0000 mg | CHEWABLE_TABLET | Freq: Once | ORAL | Status: AC
  Administered 2013-01-16: 324 mg via ORAL
  Filled 2013-01-16: qty 4

## 2013-01-16 MED ORDER — CLONIDINE HCL 0.2 MG PO TABS
0.2000 mg | ORAL_TABLET | Freq: Three times a day (TID) | ORAL | Status: DC
  Administered 2013-01-16 – 2013-01-19 (×8): 0.2 mg via ORAL
  Filled 2013-01-16 (×8): qty 1

## 2013-01-16 MED ORDER — IPRATROPIUM BROMIDE 0.02 % IN SOLN
0.5000 mg | RESPIRATORY_TRACT | Status: DC
  Administered 2013-01-16 – 2013-01-19 (×18): 0.5 mg via RESPIRATORY_TRACT
  Filled 2013-01-16 (×17): qty 2.5

## 2013-01-16 MED ORDER — HYDROCODONE-ACETAMINOPHEN 5-325 MG PO TABS: 1.0000 | ORAL_TABLET | Freq: Four times a day (QID) | ORAL | Status: DC | PRN

## 2013-01-16 MED ORDER — ALBUTEROL SULFATE (5 MG/ML) 0.5% IN NEBU
2.5000 mg | INHALATION_SOLUTION | RESPIRATORY_TRACT | Status: DC
  Administered 2013-01-17 – 2013-01-19 (×16): 2.5 mg via RESPIRATORY_TRACT
  Filled 2013-01-16 (×17): qty 0.5

## 2013-01-16 MED ORDER — FUROSEMIDE 10 MG/ML IJ SOLN: 40.0000 mg | Freq: Every day | INTRAMUSCULAR | Status: DC

## 2013-01-16 MED ORDER — FUROSEMIDE 10 MG/ML IJ SOLN: 40.0000 mg | Freq: Once | INTRAMUSCULAR | Status: DC

## 2013-01-16 MED ORDER — FUROSEMIDE 40 MG PO TABS
40.0000 mg | ORAL_TABLET | Freq: Two times a day (BID) | ORAL | Status: DC
  Administered 2013-01-16 – 2013-01-19 (×5): 40 mg via ORAL
  Filled 2013-01-16 (×5): qty 1

## 2013-01-16 MED ORDER — FUROSEMIDE 10 MG/ML IJ SOLN
40.0000 mg | Freq: Once | INTRAMUSCULAR | Status: AC
  Administered 2013-01-16: 40 mg via INTRAVENOUS
  Filled 2013-01-16: qty 4

## 2013-01-16 MED ORDER — ACETAMINOPHEN-CODEINE #3 300-30 MG PO TABS: 1.0000 | ORAL_TABLET | ORAL | Status: DC | PRN

## 2013-01-16 MED ORDER — PANTOPRAZOLE SODIUM 40 MG PO TBEC
40.0000 mg | DELAYED_RELEASE_TABLET | Freq: Two times a day (BID) | ORAL | Status: DC
  Administered 2013-01-16 – 2013-01-19 (×6): 40 mg via ORAL
  Filled 2013-01-16 (×7): qty 1

## 2013-01-16 MED ORDER — MOMETASONE FURO-FORMOTEROL FUM 200-5 MCG/ACT IN AERO
2.0000 | INHALATION_SPRAY | Freq: Two times a day (BID) | RESPIRATORY_TRACT | Status: DC
  Administered 2013-01-17 – 2013-01-19 (×6): 2 via RESPIRATORY_TRACT
  Filled 2013-01-16: qty 8.8

## 2013-01-16 MED ORDER — GI COCKTAIL ~~LOC~~
30.0000 mL | Freq: Once | ORAL | Status: AC
  Administered 2013-01-16: 30 mL via ORAL

## 2013-01-16 MED ORDER — MOMETASONE FURO-FORMOTEROL FUM 100-5 MCG/ACT IN AERO
INHALATION_SPRAY | RESPIRATORY_TRACT | Status: AC
  Filled 2013-01-16: qty 8.8

## 2013-01-16 MED ORDER — DOCUSATE SODIUM 100 MG PO CAPS
100.0000 mg | ORAL_CAPSULE | Freq: Two times a day (BID) | ORAL | Status: DC | PRN
  Administered 2013-01-17: 100 mg via ORAL

## 2013-01-16 MED ORDER — LISINOPRIL 10 MG PO TABS
40.0000 mg | ORAL_TABLET | Freq: Every day | ORAL | Status: DC
  Administered 2013-01-16 – 2013-01-18 (×3): 40 mg via ORAL
  Filled 2013-01-16 (×3): qty 4

## 2013-01-16 MED ORDER — ALBUTEROL (5 MG/ML) CONTINUOUS INHALATION SOLN
20.0000 mg/h | INHALATION_SOLUTION | RESPIRATORY_TRACT | Status: DC
  Administered 2013-01-16: 20 mg/h via RESPIRATORY_TRACT
  Filled 2013-01-16: qty 20

## 2013-01-16 MED ORDER — DOCUSATE SODIUM 100 MG PO CAPS
100.0000 mg | ORAL_CAPSULE | Freq: Two times a day (BID) | ORAL | Status: DC
  Administered 2013-01-16 – 2013-01-19 (×6): 100 mg via ORAL
  Filled 2013-01-16 (×6): qty 1

## 2013-01-16 NOTE — ED Provider Notes (Addendum)
TIME SEEN: 9:04 AM  CHIEF COMPLAINT: SOB  HPI: [name] is a 57 y.o. male with a history of HTN, CKD, CHF and asthma/COPD who presents to the Emergency Department complaining of SOB that started this morning. He denies chest pain, fever, and cough as associated symptoms. No lower extremity swelling or pain. He was given albuterol PTA.  He states that he was seen in the ED three weeks ago with similar symptoms.  The patient states he quit smoking a year ago.  He states that he wears 2.5 oxygen at home.  The patient's PCP is Dr. Juanetta Gosling.    ROS: See HPI Constitutional: no fever  Eyes: no drainage  ENT: no runny nose   Cardiovascular:  no chest pain  Resp:  SOB  GI: no vomiting GU: no dysuria Integumentary: no rash  Allergy: no hives  Musculoskeletal: no leg swelling  Neurological: no slurred speech ROS otherwise negative  PAST MEDICAL HISTORY/PAST SURGICAL HISTORY:  Past Medical History  Diagnosis Date  . Hypertension   . Dyspnea   . Chronic kidney disease, stage 2, mildly decreased GFR     Creatinine of 1.31 in 04/2011  . COPD (chronic obstructive pulmonary disease)     moderate by spirometry in 08/2009;FEV1 of 1.1 ;nl alpha -1 antitrypsin   . Obesity   . Psoriasis   . Left eye trauma     with loss of vision  . Erectile dysfunction   . Lumbosacral spinal stenosis     chronic low back pain  . Headache(784.0)   . Sleep apnea     O2 at night  . Carotid artery aneurysm   . Asthma   . Psoriasis   . CHF (congestive heart failure)   . Renal insufficiency     MEDICATIONS:  Prior to Admission medications   Medication Sig Start Date End Date Taking? Authorizing Provider  acetaminophen-codeine (TYLENOL #3) 300-30 MG per tablet Take 1 tablet by mouth every 4 (four) hours as needed for pain. 01/06/13   Vickki Hearing, MD  albuterol (PROVENTIL HFA;VENTOLIN HFA) 108 (90 BASE) MCG/ACT inhaler Inhale 2 puffs into the lungs as needed for wheezing (rescue inhaler).      Historical Provider, MD  cloNIDine (CATAPRES) 0.2 MG tablet Take 0.2 mg by mouth 3 (three) times daily.    Historical Provider, MD  docusate sodium 100 MG CAPS Take 100 mg by mouth 2 (two) times daily. 01/05/13   Fredirick Maudlin, MD  fluticasone (CUTIVATE) 0.05 % cream  09/21/12   Historical Provider, MD  Fluticasone Furoate-Vilanterol (BREO ELLIPTA) 100-25 MCG/INH AEPB Inhale 1 puff into the lungs daily as needed (Uses when he doesn't have Advair Inhaler.).    Historical Provider, MD  furosemide (LASIX) 40 MG tablet Take 1 tablet (40 mg total) by mouth 2 (two) times daily. 12/01/12   Fredirick Maudlin, MD  guaiFENesin (MUCINEX) 600 MG 12 hr tablet Take 1,200 mg by mouth 2 (two) times daily.    Historical Provider, MD  HYDROcodone-acetaminophen (NORCO/VICODIN) 5-325 MG per tablet Take 1 tablet by mouth every 6 (six) hours as needed for pain. 12/07/12   Vickki Hearing, MD  hydrocortisone (ANUSOL-HC) 25 MG suppository Place 1 suppository (25 mg total) rectally 2 (two) times daily. 01/05/13   Fredirick Maudlin, MD  lisinopril (PRINIVIL,ZESTRIL) 40 MG tablet Take 40 mg by mouth daily.  04/06/12   Historical Provider, MD  methotrexate (RHEUMATREX) 2.5 MG tablet Take 10 mg by mouth once a week. Sunday  11/20/12   Historical Provider, MD  mometasone-formoterol (DULERA) 200-5 MCG/ACT AERO Inhale 2 puffs into the lungs 2 (two) times daily. 01/05/13   Fredirick Maudlin, MD  pantoprazole (PROTONIX) 40 MG tablet Take 1 tablet (40 mg total) by mouth 2 (two) times daily. 01/05/13   Fredirick Maudlin, MD  predniSONE (DELTASONE) 10 MG tablet Take 2 tablets (20 mg total) by mouth daily. 01/05/13   Fredirick Maudlin, MD  ranitidine (ZANTAC) 300 MG tablet Take 300 mg by mouth at bedtime.    Historical Provider, MD    ALLERGIES:  Allergies  Allergen Reactions  . Bee Venom Anaphylaxis    Patient has an epi pen    SOCIAL HISTORY:  History  Substance Use Topics  . Smoking status: Former Smoker -- 1.00 packs/day for 20  years    Quit date: 06/10/2010  . Smokeless tobacco: Former Neurosurgeon     Comment: Quit in 06/2010  . Alcohol Use: 0.5 oz/week    1 drink(s) per week     Comment: hx of ETOH abuse, none since hospitalization in December 2013     FAMILY HISTORY: Family History  Problem Relation Age of Onset  . Heart disease    . Arthritis    . Lung disease    . Cancer    . Asthma    . Colon cancer Brother     deceased at 62   . Brain cancer Brother     EXAM: There were no vitals taken for this visit. CONSTITUTIONAL: Alert and oriented and responds appropriately to questions. In mild respiratory distress, nontoxic HEAD: Normocephalic EYES: Conjunctivae clear, PERRL ENT: normal nose; no rhinorrhea; moist mucous membranes; pharynx without lesions noted NECK: Supple, no meningismus, no LAD  CARD: Tachycardic; S1 and S2 appreciated; no murmurs, no clicks, no rubs, no gallops RESP: Patient is tachypneic, increased work of breathing, diminished breath sounds bilaterally with mild expiratory wheezing diffusely, no rhonchi or rales ABD/GI: Normal bowel sounds; non-distended; soft, non-tender, no rebound, no guarding BACK:  The back appears normal and is non-tender to palpation, there is no CVA tenderness EXT: Normal ROM in all joints; non-tender to palpation; no edema; normal capillary refill; no cyanosis    SKIN: Normal color for age and race; warm NEURO: Moves all extremities equally PSYCH: The patient's mood and manner are appropriate. Grooming and personal hygiene are appropriate.  MEDICAL DECISION MAKING: Patient here with shortness of breath that started this morning. He is tachycardic, tachypneic, and has increased worker breathing. Suspect CHF versus COPD exacerbation. No prior history of PE or DVT. We'll obtain cardiac labs, BMP, chest x-ray. We'll give continuous albuterol, Solu-Medrol. Suspect patient will need admission.  ED PROGRESS: Patient has some mild improvement in his aeration after  continuous treatment. His labs show a leukocytosis but he is chronically on prednisone. Blood gas shows pH of 7.396, PCO2 51.9, PO2 104. Patient's BNP is elevated at 2500. Chest x-ray shows minimal vascular congestion. No infiltrate. Troponin negative. Discussed with Dr. Megan Mans for admission.  Patient is still currently receiving albuterol and is on 3 L nasal cannula to maintain his sats greater than 92%.    Date: 01/16/2013 9:03 AM  Rate: 107  Rhythm: Sinus tachycardia, PACs  QRS Axis: normal  Intervals: normal  ST/T Wave abnormalities: normal  Conduction Disutrbances: none  Narrative Interpretation: Sinus tachycardia, nonspecific T wave flattening in lateral leads  CRITICAL CARE Performed by: Raelyn Number   Total critical care time: 30 minutes  Critical  care time was exclusive of separately billable procedures and treating other patients.  Critical care was necessary to treat or prevent imminent or life-threatening deterioration.  Critical care was time spent personally by me on the following activities: development of treatment plan with patient and/or surrogate as well as nursing, discussions with consultants, evaluation of patient's response to treatment, examination of patient, obtaining history from patient or surrogate, ordering and performing treatments and interventions, ordering and review of laboratory studies, ordering and review of radiographic studies, pulse oximetry and re-evaluation of patient's condition.     Layla Maw Berkeley Veldman, DO 01/16/13 1608  Layla Maw Avondre Richens, DO 01/16/13 1911

## 2013-01-16 NOTE — ED Notes (Addendum)
Patient w/hx CHF and COPD woke this morning with SOB.  Denies chest pain.  Albuterol 2.5 mg given in route via EMS.

## 2013-01-16 NOTE — ED Notes (Signed)
Patient states he woke well, took his usual medications.  When he went outside into the cold, he had sudden onset of difficulty breathing.  Lungs extremely diminished, w/prolonged expiratory phase and accessory muscle use.

## 2013-01-16 NOTE — ED Notes (Signed)
Indigestion relieved

## 2013-01-16 NOTE — ED Notes (Signed)
Report given to Cindy, RN in ICU.

## 2013-01-17 MED ORDER — ENOXAPARIN SODIUM 40 MG/0.4ML ~~LOC~~ SOLN
40.0000 mg | SUBCUTANEOUS | Status: DC
  Administered 2013-01-17 – 2013-01-19 (×3): 40 mg via SUBCUTANEOUS
  Filled 2013-01-17 (×4): qty 0.4

## 2013-01-17 MED ORDER — LEVOFLOXACIN IN D5W 500 MG/100ML IV SOLN
500.0000 mg | INTRAVENOUS | Status: DC
  Administered 2013-01-17 – 2013-01-18 (×2): 500 mg via INTRAVENOUS
  Filled 2013-01-17 (×4): qty 100

## 2013-01-17 NOTE — H&P (Signed)
[name], [name]           ACCOUNT NO.:  [id number]  MEDICAL RECORD NO.:  [id number]  LOCATION:  IC03                          FACILITY:  [facility]  PHYSICIAN:  Jaedynn Bohlken G. Renard Matter, MD   DATE OF BIRTH:  1955-05-10  DATE OF ADMISSION:  01/16/2013 DATE OF DISCHARGE:  LH                             HISTORY & PHYSICAL   This 57 year old, male patient, in the emergency department with a chief complaint being shortness of breath.  He was seen and evaluated by ED physician, and given albuterol treatment.  Apparently, he has a longstanding history of COPD and CHF as well as chronic kidney disease stage II.  He did show improvement with treatment given in the emergency department.  X-ray showed minimal vascular congestion.  His troponin was negative.  It was felt he should be in step-down in the ICU.  His blood gases on admission pCO2 of 51.9, pO2 of 104.0, pH 7.396.  PAST MEDICAL HISTORY: 1. History of hypertension. 2. Chronic kidney disease stage II. 3. COPD. 4. Obesity. 5. Psoriasis. 6. Sleep apnea. 7. Prior history of CHF. 8. Renal insufficiency.  REVIEW OF SYSTEMS:  HEENT:  Negative.  CARDIOPULMONARY:  The patient does have increased dyspnea, occasional cough.  No hemoptysis.  GI:  No nausea, vomiting, diarrhea.  GU:  No dysuria, hematuria.  ALLERGIES:  BEE VENOM.  SOCIAL HISTORY:  The patient is a former cigarette smoker.  Drinks occasionally.  FAMILY HISTORY:  Positive for colon cancer with 1 of his brothers and brain cancer in 1 of his brothers.  Prior history of heart disease in his family.  PHYSICAL EXAMINATION:  GENERAL:  Alert male. VITAL SIGNS:  Blood pressure of 129/57, respirations 20, pulse 113, temp 98. HEENT:  Eyes, PERRLA.  TMs negative.  Oropharynx benign. NECK:  Supple.  No JVD or thyroid abnormalities. HEART:  Regular rhythm.  No murmurs.  No cardiomegaly. LUNGS:  Diminished breath sounds bilaterally. ABDOMEN:  Slightly obese.  No  organomegaly. EXTREMITIES:  Free of edema. NEUROLOGIC:  Cranial nerves intact.  No motor or sensory abnormalities.  ASSESSMENT:  The patient was admitted with, 1. Exacerbation of chronic obstructive pulmonary disease. 2. Mild congestive heart failure. 3. He does have chronic renal disease stage II. 4. Hypertension. 5. Sleep apnea.  MEDICATION LIST: 1. Tylenol No. 3 every 4 hours as needed for pain. 2. Proventil HFA 2 puffs every 4 hours as needed. 3. Clonidine 0.2 mg t.i.d.. 4. Docusate sodium 100 mg b.i.d. 5. Cutivate cream 0.05% daily. 6. Breo Ellipta 100/25, 1 inhalation daily. 7. Lasix 40 mg b.i.d. 8. Mucinex 1200 mg b.i.d. 9. Hydrocodone-acetaminophen 5/325 every 6 hours as needed for pain. 10.Anusol-HC suppository twice daily as needed. 11.Prinivil 40 mg daily. 12.Rheumatrex 2.5 mg. 13.Protonix 40 mg daily. 14.Deltasone 20 mg daily. 15.Ranitidine 300 mg at bedtime.     [name] Retz G. Renard Matter, MD     AGM/MEDQ  D:  01/16/2013  T:  01/17/2013  Job:  [id number]

## 2013-01-17 NOTE — Progress Notes (Signed)
Subjective: He is awake and alert. He says he feels much better. He is less short of breath.  Objective: Vital signs in last 24 hours: Temp:  [98 F (36.7 C)] 98 F (36.7 C) (11/08 1515) Pulse Rate:  [88-113] 113 (11/08 1515) Resp:  [10-20] 16 (11/09 0700) BP: (86-147)/(49-124) 115/68 mmHg (11/09 0700) SpO2:  [93 %-98 %] 97 % (11/09 0751) Weight:  [103 kg (227 lb 1.2 oz)] 103 kg (227 lb 1.2 oz) (11/08 1515) Weight change:  Last BM Date: 01/16/13  Intake/Output from previous day: 11/08 0701 - 11/09 0700 In: 360 [P.O.:360] Out: 1900 [Urine:1900]  PHYSICAL EXAM General appearance: alert, cooperative and no distress Resp: clear to auscultation bilaterally Cardio: regular rate and rhythm, S1, S2 normal, no murmur, click, rub or gallop GI: soft, non-tender; bowel sounds normal; no masses,  no organomegaly Extremities: extremities normal, atraumatic, no cyanosis or edema  Lab Results:    Basic Metabolic Panel:  Recent Labs  81/19/14 0930  NA 139  K 3.5  CL 96  CO2 34*  GLUCOSE 96  BUN 13  CREATININE 1.07  CALCIUM 9.3   Liver Function Tests: No results found for this basename: AST, ALT, ALKPHOS, BILITOT, PROT, ALBUMIN,  in the last 72 hours No results found for this basename: LIPASE, AMYLASE,  in the last 72 hours No results found for this basename: AMMONIA,  in the last 72 hours CBC:  Recent Labs  01/16/13 0930  WBC 18.8*  HGB 10.8*  HCT 33.3*  MCV 94.3  PLT 245   Cardiac Enzymes:  Recent Labs  01/16/13 0930  TROPONINI <0.30   BNP:  Recent Labs  01/16/13 0930  PROBNP 2598.0*   D-Dimer: No results found for this basename: DDIMER,  in the last 72 hours CBG: No results found for this basename: GLUCAP,  in the last 72 hours Hemoglobin A1C: No results found for this basename: HGBA1C,  in the last 72 hours Fasting Lipid Panel: No results found for this basename: CHOL, HDL, LDLCALC, TRIG, CHOLHDL, LDLDIRECT,  in the last 72 hours Thyroid Function  Tests: No results found for this basename: TSH, T4TOTAL, FREET4, T3FREE, THYROIDAB,  in the last 72 hours Anemia Panel: No results found for this basename: VITAMINB12, FOLATE, FERRITIN, TIBC, IRON, RETICCTPCT,  in the last 72 hours Coagulation: No results found for this basename: LABPROT, INR,  in the last 72 hours Urine Drug Screen: Drugs of Abuse     Component Value Date/Time   LABOPIA NONE DETECTED 03/01/2012 0019   COCAINSCRNUR POSITIVE* 03/01/2012 0019   LABBENZ NONE DETECTED 03/01/2012 0019   AMPHETMU NONE DETECTED 03/01/2012 0019   THCU NONE DETECTED 03/01/2012 0019   LABBARB NONE DETECTED 03/01/2012 0019    Alcohol Level: No results found for this basename: ETH,  in the last 72 hours Urinalysis: No results found for this basename: COLORURINE, APPERANCEUR, LABSPEC, PHURINE, GLUCOSEU, HGBUR, BILIRUBINUR, KETONESUR, PROTEINUR, UROBILINOGEN, NITRITE, LEUKOCYTESUR,  in the last 72 hours Misc. Labs:  ABGS  Recent Labs  01/16/13 0935  PHART 7.396  PO2ART 104.0*  TCO2 28.8  HCO3 31.2*   CULTURES Recent Results (from the past 240 hour(s))  MRSA PCR SCREENING     Status: None   Collection Time    01/16/13  3:48 PM      Result Value Range Status   MRSA by PCR NEGATIVE  NEGATIVE Final   Comment:            The GeneXpert MRSA Assay (FDA  approved for NASAL specimens     only), is one component of a     comprehensive MRSA colonization     surveillance program. It is not     intended to diagnose MRSA     infection nor to guide or     monitor treatment for     MRSA infections.   Studies/Results: Dg Chest Port 1 View  01/16/2013   CLINICAL DATA:  Shortness of breath and weakness.  EXAM: PORTABLE CHEST - 1 VIEW  COMPARISON:  01/03/2013 and 11/2012  FINDINGS: Lordotic technique is demonstrated as the lungs are adequately inflated with subtle prominence of the perihilar markings suggesting minimal vascular congestion. Mild stable cardiomegaly. Remainder of the exam is  unchanged.  IMPRESSION: Mild stable cardiomegaly. Suggestion of minimal vascular congestion.   Electronically Signed   By: Elberta Fortis M.D.   On: 01/16/2013 09:32    Medications:  Prior to Admission:  Prescriptions prior to admission  Medication Sig Dispense Refill  . acetaminophen-codeine (TYLENOL #3) 300-30 MG per tablet Take 1 tablet by mouth every 4 (four) hours as needed for pain.  60 tablet  5  . albuterol (PROVENTIL HFA;VENTOLIN HFA) 108 (90 BASE) MCG/ACT inhaler Inhale 2 puffs into the lungs as needed for wheezing (rescue inhaler).       Marland Kitchen albuterol (PROVENTIL) (2.5 MG/3ML) 0.083% nebulizer solution Take 2.5 mg by nebulization every 6 (six) hours as needed for wheezing or shortness of breath.      . cloNIDine (CATAPRES) 0.2 MG tablet Take 0.2 mg by mouth 3 (three) times daily.      Marland Kitchen docusate sodium (COLACE) 100 MG capsule Take 100 mg by mouth 2 (two) times daily as needed for mild constipation.      . [EXPIRED] Docusate Sodium (DSS) 100 MG CAPS Take 100 mg by mouth 2 (two) times daily as needed.      . fluticasone (CUTIVATE) 0.05 % cream Apply 1 application topically daily as needed (rash).       . furosemide (LASIX) 40 MG tablet Take 1 tablet (40 mg total) by mouth 2 (two) times daily.  30 tablet  5  . guaiFENesin (MUCINEX) 600 MG 12 hr tablet Take 1,200 mg by mouth at bedtime as needed for to loosen phlegm.       Marland Kitchen HYDROcodone-acetaminophen (NORCO/VICODIN) 5-325 MG per tablet Take 1 tablet by mouth every 6 (six) hours as needed for pain.  120 tablet  0  . hydrocortisone (ANUSOL-HC) 25 MG suppository Place 25 mg rectally daily as needed for hemorrhoids.      Marland Kitchen lisinopril (PRINIVIL,ZESTRIL) 40 MG tablet Take 40 mg by mouth daily.       . mometasone-formoterol (DULERA) 200-5 MCG/ACT AERO Inhale 2 puffs into the lungs 2 (two) times daily.  1 Inhaler  12  . pantoprazole (PROTONIX) 40 MG tablet Take 1 tablet (40 mg total) by mouth 2 (two) times daily.  60 tablet  12  . predniSONE  (DELTASONE) 10 MG tablet Take 10 mg by mouth 2 (two) times daily.      . ranitidine (ZANTAC) 300 MG tablet Take 300 mg by mouth at bedtime.      . methotrexate (RHEUMATREX) 2.5 MG tablet Take 10 mg by mouth once a week. Saturday       Scheduled: . ipratropium  0.5 mg Nebulization Q4H   And  . albuterol  2.5 mg Nebulization Q4H  . cloNIDine  0.2 mg Oral TID  . docusate sodium  100  mg Oral BID  . enoxaparin (LOVENOX) injection  40 mg Subcutaneous Q24H  . furosemide  40 mg Oral BID  . guaiFENesin  1,200 mg Oral BID  . levofloxacin (LEVAQUIN) IV  500 mg Intravenous Q24H  . lisinopril  40 mg Oral Daily  . methotrexate  10 mg Oral Weekly  . methylPREDNISolone (SOLU-MEDROL) injection  125 mg Intravenous Q6H  . mometasone-formoterol  2 puff Inhalation BID  . pantoprazole  40 mg Oral BID   Continuous: . albuterol 20 mg/hr (01/16/13 0929)   AVW:[id number], docusate sodium, HYDROcodone-acetaminophen  Assesment: He was admitted with COPD exacerbation. He is better. It looked like this had occurred after he went outside into cold air and developed acute bronchospasm. He has CHF and chest x-ray was suggestive that he had some volume overload and he says he thinks that's better after receiving IV Lasix Active Problems:   * No active hospital problems. *    Plan: Continue current treatments I will transfer him out of the step down    LOS: 1 day   Keiarra Charon L 01/17/2013, 10:21 AM

## 2013-01-18 NOTE — Progress Notes (Signed)
Patient transferred to room 305. Report given to Soledad Gerlach Schonewitz RN. Vital signs stable at transfer.

## 2013-01-18 NOTE — Progress Notes (Signed)
Subjective: He says he feels well. He has no complaints. His shortness of breath is okay. He says he is having trouble coughing up sputum and requests a flutter valve  Objective: Vital signs in last 24 hours: Temp:  [97.9 F (36.6 C)-98.3 F (36.8 C)] 98.3 F (36.8 C) (11/10 0400) Resp:  [11-22] 14 (11/10 0600) BP: (102-135)/(54-75) 105/56 mmHg (11/10 0600) SpO2:  [3 %-97 %] 91 % (11/10 0732) Weight:  [104.4 kg (230 lb 2.6 oz)] 104.4 kg (230 lb 2.6 oz) (11/10 0500) Weight change: 2.341 kg (5 lb 2.6 oz) Last BM Date: 01/17/13  Intake/Output from previous day: 11/09 0701 - 11/10 0700 In: 1300 [P.O.:1200; IV Piggyback:100] Out: 1150 [Urine:1150]  PHYSICAL EXAM General appearance: alert, cooperative and no distress Resp: rhonchi bilaterally Cardio: S1, S2 normal GI: soft, non-tender; bowel sounds normal; no masses,  no organomegaly Extremities: extremities normal, atraumatic, no cyanosis or edema  Lab Results:    Basic Metabolic Panel:  Recent Labs  16/10/96 0930  NA 139  K 3.5  CL 96  CO2 34*  GLUCOSE 96  BUN 13  CREATININE 1.07  CALCIUM 9.3   Liver Function Tests: No results found for this basename: AST, ALT, ALKPHOS, BILITOT, PROT, ALBUMIN,  in the last 72 hours No results found for this basename: LIPASE, AMYLASE,  in the last 72 hours No results found for this basename: AMMONIA,  in the last 72 hours CBC:  Recent Labs  01/16/13 0930  WBC 18.8*  HGB 10.8*  HCT 33.3*  MCV 94.3  PLT 245   Cardiac Enzymes:  Recent Labs  01/16/13 0930  TROPONINI <0.30   BNP:  Recent Labs  01/16/13 0930  PROBNP 2598.0*   D-Dimer: No results found for this basename: DDIMER,  in the last 72 hours CBG: No results found for this basename: GLUCAP,  in the last 72 hours Hemoglobin A1C: No results found for this basename: HGBA1C,  in the last 72 hours Fasting Lipid Panel: No results found for this basename: CHOL, HDL, LDLCALC, TRIG, CHOLHDL, LDLDIRECT,  in the last  72 hours Thyroid Function Tests: No results found for this basename: TSH, T4TOTAL, FREET4, T3FREE, THYROIDAB,  in the last 72 hours Anemia Panel: No results found for this basename: VITAMINB12, FOLATE, FERRITIN, TIBC, IRON, RETICCTPCT,  in the last 72 hours Coagulation: No results found for this basename: LABPROT, INR,  in the last 72 hours Urine Drug Screen: Drugs of Abuse     Component Value Date/Time   LABOPIA NONE DETECTED 03/01/2012 0019   COCAINSCRNUR POSITIVE* 03/01/2012 0019   LABBENZ NONE DETECTED 03/01/2012 0019   AMPHETMU NONE DETECTED 03/01/2012 0019   THCU NONE DETECTED 03/01/2012 0019   LABBARB NONE DETECTED 03/01/2012 0019    Alcohol Level: No results found for this basename: ETH,  in the last 72 hours Urinalysis: No results found for this basename: COLORURINE, APPERANCEUR, LABSPEC, PHURINE, GLUCOSEU, HGBUR, BILIRUBINUR, KETONESUR, PROTEINUR, UROBILINOGEN, NITRITE, LEUKOCYTESUR,  in the last 72 hours Misc. Labs:  ABGS  Recent Labs  01/16/13 0935  PHART 7.396  PO2ART 104.0*  TCO2 28.8  HCO3 31.2*   CULTURES Recent Results (from the past 240 hour(s))  MRSA PCR SCREENING     Status: None   Collection Time    01/16/13  3:48 PM      Result Value Range Status   MRSA by PCR NEGATIVE  NEGATIVE Final   Comment:            The GeneXpert MRSA Assay (  FDA     approved for NASAL specimens     only), is one component of a     comprehensive MRSA colonization     surveillance program. It is not     intended to diagnose MRSA     infection nor to guide or     monitor treatment for     MRSA infections.   Studies/Results: Dg Chest Port 1 View  01/16/2013   CLINICAL DATA:  Shortness of breath and weakness.  EXAM: PORTABLE CHEST - 1 VIEW  COMPARISON:  01/03/2013 and 11/2012  FINDINGS: Lordotic technique is demonstrated as the lungs are adequately inflated with subtle prominence of the perihilar markings suggesting minimal vascular congestion. Mild stable cardiomegaly.  Remainder of the exam is unchanged.  IMPRESSION: Mild stable cardiomegaly. Suggestion of minimal vascular congestion.   Electronically Signed   By: Elberta Fortis M.D.   On: 01/16/2013 09:32    Medications:  Scheduled: . ipratropium  0.5 mg Nebulization Q4H   And  . albuterol  2.5 mg Nebulization Q4H  . cloNIDine  0.2 mg Oral TID  . docusate sodium  100 mg Oral BID  . enoxaparin (LOVENOX) injection  40 mg Subcutaneous Q24H  . furosemide  40 mg Oral BID  . guaiFENesin  1,200 mg Oral BID  . levofloxacin (LEVAQUIN) IV  500 mg Intravenous Q24H  . lisinopril  40 mg Oral Daily  . methotrexate  10 mg Oral Weekly  . methylPREDNISolone (SOLU-MEDROL) injection  125 mg Intravenous Q6H  . mometasone-formoterol  2 puff Inhalation BID  . pantoprazole  40 mg Oral BID   Continuous: . albuterol 20 mg/hr (01/16/13 0929)   HYQ:[id number], docusate sodium, HYDROcodone-acetaminophen  Assesment: He was admitted with COPD exacerbation. He has some element of CHF and is generally better. I had planned to move him out of the step down yesterday but no beds were available. He is having trouble mobilizing sputum so he will have a flutter valve Active Problems:   * No active hospital problems. *    Plan: As above    LOS: 2 days   Zeppelin Commisso L 01/18/2013, 8:11 AM

## 2013-01-18 NOTE — Progress Notes (Signed)
UR chart review completed.  

## 2013-01-19 MED ORDER — PREDNISONE 10 MG PO TABS: ORAL_TABLET | ORAL | Status: DC

## 2013-01-19 MED ORDER — LEVOFLOXACIN 500 MG PO TABS: 500.0000 mg | ORAL_TABLET | Freq: Every day | ORAL | Status: DC

## 2013-01-19 NOTE — Progress Notes (Signed)
Subjective: He says he feels well and wants to go home. He has no new complaints  Objective: Vital signs in last 24 hours: Temp:  [97.9 F (36.6 C)-98.2 F (36.8 C)] 98.2 F (36.8 C) (11/11 0459) Pulse Rate:  [86-99] 99 (11/11 0459) Resp:  [16-18] 18 (11/11 0459) BP: (106-121)/(50-69) 121/67 mmHg (11/11 0459) SpO2:  [94 %-100 %] 94 % (11/11 0709) Weight:  [104.463 kg (230 lb 4.8 oz)] 104.463 kg (230 lb 4.8 oz) (11/11 0459) Weight change: 0.064 kg (2.2 oz) Last BM Date: 01/18/13  Intake/Output from previous day: 11/10 0701 - 11/11 0700 In: 820 [P.O.:720; IV Piggyback:100] Out: -   PHYSICAL EXAM General appearance: alert, cooperative and no distress Resp: clear to auscultation bilaterally Cardio: regular rate and rhythm, S1, S2 normal, no murmur, click, rub or gallop GI: soft, non-tender; bowel sounds normal; no masses,  no organomegaly Extremities: extremities normal, atraumatic, no cyanosis or edema  Lab Results:    Basic Metabolic Panel:  Recent Labs  40/98/11 0930  NA 139  K 3.5  CL 96  CO2 34*  GLUCOSE 96  BUN 13  CREATININE 1.07  CALCIUM 9.3   Liver Function Tests: No results found for this basename: AST, ALT, ALKPHOS, BILITOT, PROT, ALBUMIN,  in the last 72 hours No results found for this basename: LIPASE, AMYLASE,  in the last 72 hours No results found for this basename: AMMONIA,  in the last 72 hours CBC:  Recent Labs  01/16/13 0930  WBC 18.8*  HGB 10.8*  HCT 33.3*  MCV 94.3  PLT 245   Cardiac Enzymes:  Recent Labs  01/16/13 0930  TROPONINI <0.30   BNP:  Recent Labs  01/16/13 0930  PROBNP 2598.0*   D-Dimer: No results found for this basename: DDIMER,  in the last 72 hours CBG: No results found for this basename: GLUCAP,  in the last 72 hours Hemoglobin A1C: No results found for this basename: HGBA1C,  in the last 72 hours Fasting Lipid Panel: No results found for this basename: CHOL, HDL, LDLCALC, TRIG, CHOLHDL, LDLDIRECT,  in  the last 72 hours Thyroid Function Tests: No results found for this basename: TSH, T4TOTAL, FREET4, T3FREE, THYROIDAB,  in the last 72 hours Anemia Panel: No results found for this basename: VITAMINB12, FOLATE, FERRITIN, TIBC, IRON, RETICCTPCT,  in the last 72 hours Coagulation: No results found for this basename: LABPROT, INR,  in the last 72 hours Urine Drug Screen: Drugs of Abuse     Component Value Date/Time   LABOPIA NONE DETECTED 03/01/2012 0019   COCAINSCRNUR POSITIVE* 03/01/2012 0019   LABBENZ NONE DETECTED 03/01/2012 0019   AMPHETMU NONE DETECTED 03/01/2012 0019   THCU NONE DETECTED 03/01/2012 0019   LABBARB NONE DETECTED 03/01/2012 0019    Alcohol Level: No results found for this basename: ETH,  in the last 72 hours Urinalysis: No results found for this basename: COLORURINE, APPERANCEUR, LABSPEC, PHURINE, GLUCOSEU, HGBUR, BILIRUBINUR, KETONESUR, PROTEINUR, UROBILINOGEN, NITRITE, LEUKOCYTESUR,  in the last 72 hours Misc. Labs:  ABGS  Recent Labs  01/16/13 0935  PHART 7.396  PO2ART 104.0*  TCO2 28.8  HCO3 31.2*   CULTURES Recent Results (from the past 240 hour(s))  MRSA PCR SCREENING     Status: None   Collection Time    01/16/13  3:48 PM      Result Value Range Status   MRSA by PCR NEGATIVE  NEGATIVE Final   Comment:            The  GeneXpert MRSA Assay (FDA     approved for NASAL specimens     only), is one component of a     comprehensive MRSA colonization     surveillance program. It is not     intended to diagnose MRSA     infection nor to guide or     monitor treatment for     MRSA infections.   Studies/Results: No results found.  Medications:  Prior to Admission:  Prescriptions prior to admission  Medication Sig Dispense Refill  . acetaminophen-codeine (TYLENOL #3) 300-30 MG per tablet Take 1 tablet by mouth every 4 (four) hours as needed for pain.  60 tablet  5  . albuterol (PROVENTIL HFA;VENTOLIN HFA) 108 (90 BASE) MCG/ACT inhaler Inhale 2  puffs into the lungs as needed for wheezing (rescue inhaler).       Marland Kitchen albuterol (PROVENTIL) (2.5 MG/3ML) 0.083% nebulizer solution Take 2.5 mg by nebulization every 6 (six) hours as needed for wheezing or shortness of breath.      . cloNIDine (CATAPRES) 0.2 MG tablet Take 0.2 mg by mouth 3 (three) times daily.      Marland Kitchen docusate sodium (COLACE) 100 MG capsule Take 100 mg by mouth 2 (two) times daily as needed for mild constipation.      . [EXPIRED] Docusate Sodium (DSS) 100 MG CAPS Take 100 mg by mouth 2 (two) times daily as needed.      . fluticasone (CUTIVATE) 0.05 % cream Apply 1 application topically daily as needed (rash).       . furosemide (LASIX) 40 MG tablet Take 1 tablet (40 mg total) by mouth 2 (two) times daily.  30 tablet  5  . guaiFENesin (MUCINEX) 600 MG 12 hr tablet Take 1,200 mg by mouth at bedtime as needed for to loosen phlegm.       Marland Kitchen HYDROcodone-acetaminophen (NORCO/VICODIN) 5-325 MG per tablet Take 1 tablet by mouth every 6 (six) hours as needed for pain.  120 tablet  0  . hydrocortisone (ANUSOL-HC) 25 MG suppository Place 25 mg rectally daily as needed for hemorrhoids.      Marland Kitchen lisinopril (PRINIVIL,ZESTRIL) 40 MG tablet Take 40 mg by mouth daily.       . mometasone-formoterol (DULERA) 200-5 MCG/ACT AERO Inhale 2 puffs into the lungs 2 (two) times daily.  1 Inhaler  12  . pantoprazole (PROTONIX) 40 MG tablet Take 1 tablet (40 mg total) by mouth 2 (two) times daily.  60 tablet  12  . predniSONE (DELTASONE) 10 MG tablet Take 10 mg by mouth 2 (two) times daily.      . ranitidine (ZANTAC) 300 MG tablet Take 300 mg by mouth at bedtime.      . methotrexate (RHEUMATREX) 2.5 MG tablet Take 10 mg by mouth once a week. Saturday       Scheduled: . ipratropium  0.5 mg Nebulization Q4H   And  . albuterol  2.5 mg Nebulization Q4H  . cloNIDine  0.2 mg Oral TID  . docusate sodium  100 mg Oral BID  . enoxaparin (LOVENOX) injection  40 mg Subcutaneous Q24H  . furosemide  40 mg Oral BID  .  guaiFENesin  1,200 mg Oral BID  . levofloxacin (LEVAQUIN) IV  500 mg Intravenous Q24H  . lisinopril  40 mg Oral Daily  . methotrexate  10 mg Oral Weekly  . methylPREDNISolone (SOLU-MEDROL) injection  125 mg Intravenous Q6H  . mometasone-formoterol  2 puff Inhalation BID  . pantoprazole  40 mg  Oral BID   Continuous: . albuterol 20 mg/hr (01/16/13 0929)   ZOX:[id number], docusate sodium, HYDROcodone-acetaminophen  Assesment: He was admitted with COPD exacerbation. He also has chronic diastolic heart failure but I think his problem was mostly from his COPD. This occurred when he went out into cold air. He has improved and is ready for discharge Active Problems:   CHRONIC OBSTRUCTIVE PULMONARY DISEASE   Chronic kidney disease, stage 2, mildly decreased GFR   COPD exacerbation   Chronic diastolic HF (heart failure)   Bilateral lower extremity edema    Plan: Discharge home with extensive home support.    LOS: 3 days   [name] 01/19/2013, 8:52 AM

## 2013-01-19 NOTE — Care Management Note (Signed)
    Page 1 of 1   01/19/2013     9:09:11 AM   CARE MANAGEMENT NOTE 01/19/2013  Patient:  GRANITE, GODMAN   Account Number:  [id number]  Date Initiated:  01/19/2013  Documentation initiated by:  Sharrie Rothman  Subjective/Objective Assessment:   Pt admitted from home with COPD exacerbation. Pt lives alone but has a friend that stays with him at times. Pt is active with [facility] and [facility]. Pt has CAP aide 7 days a week/3 hours a day.     Action/Plan:   Pt has home O2, cpap, and neb machine. Pt discharged home today with resumption of HH. Alroy Bailiff of [facility] aware and will collect the pts information from the chart. HH services to resume within 48 hours of D/c. [facility] aware of discharge.   Anticipated DC Date:  01/19/2013   Anticipated DC Plan:  HOME W HOME HEALTH SERVICES      DC Planning Services  CM consult      [facility] Choice  Resumption Of Svcs/PTA Provider   Choice offered to / List presented to:          [facility] arranged  HH-1 RN  HH-2 PT      [facility] agency  [facility]   Status of service:  Completed, signed off Medicare Important Message given?  YES (If response is "NO", the following Medicare IM given date fields will be blank) Date Medicare IM given:  01/19/2013 Date Additional Medicare IM given:    Discharge Disposition:  HOME W HOME HEALTH SERVICES  Per UR Regulation:    If discussed at Long Length of Stay Meetings, dates discussed:    Comments:  01/19/13 0910 Arlyss Queen, RN BSN CM

## 2013-01-19 NOTE — Progress Notes (Signed)
Patient discharged with instructions given on medications and follow up visits,patient verbalized understanding.Prescriptions sent with patient.No c/o pain or discomfort noted.Accompanied by staff to an awaiting vehicle.

## 2013-01-22 NOTE — Discharge Summary (Signed)
Physician Discharge Summary  Patient ID: [name] MRN: [id number] DOB/AGE: [birth year] 57 y.o. Primary Care Physician:Apolonia Ellwood L, MD Admit date: 01/16/2013 Discharge date: 01/22/2013    Discharge Diagnoses:   Active Problems:   CHRONIC OBSTRUCTIVE PULMONARY DISEASE   Chronic kidney disease, stage 2, mildly decreased GFR   COPD exacerbation   Chronic diastolic HF (heart failure)   Bilateral lower extremity edema  esophageal dysmotility    Medication List         acetaminophen-codeine 300-30 MG per tablet  Commonly known as:  TYLENOL #3  Take 1 tablet by mouth every 4 (four) hours as needed for pain.     albuterol 108 (90 BASE) MCG/ACT inhaler  Commonly known as:  PROVENTIL HFA;VENTOLIN HFA  Inhale 2 puffs into the lungs as needed for wheezing (rescue inhaler).     albuterol (2.5 MG/3ML) 0.083% nebulizer solution  Commonly known as:  PROVENTIL  Take 2.5 mg by nebulization every 6 (six) hours as needed for wheezing or shortness of breath.     cloNIDine 0.2 MG tablet  Commonly known as:  CATAPRES  Take 0.2 mg by mouth 3 (three) times daily.     docusate sodium 100 MG capsule  Commonly known as:  COLACE  Take 100 mg by mouth 2 (two) times daily as needed for mild constipation.     fluticasone 0.05 % cream  Commonly known as:  CUTIVATE  Apply 1 application topically daily as needed (rash).     furosemide 40 MG tablet  Commonly known as:  LASIX  Take 1 tablet (40 mg total) by mouth 2 (two) times daily.     guaiFENesin 600 MG 12 hr tablet  Commonly known as:  MUCINEX  Take 1,200 mg by mouth at bedtime as needed for to loosen phlegm.     HYDROcodone-acetaminophen 5-325 MG per tablet  Commonly known as:  NORCO/VICODIN  Take 1 tablet by mouth every 6 (six) hours as needed for pain.     hydrocortisone 25 MG suppository  Commonly known as:  ANUSOL-HC  Place 25 mg rectally daily as needed for hemorrhoids.     levofloxacin 500 MG tablet  Commonly known  as:  LEVAQUIN  Take 1 tablet (500 mg total) by mouth daily.     lisinopril 40 MG tablet  Commonly known as:  PRINIVIL,ZESTRIL  Take 40 mg by mouth daily.     methotrexate 2.5 MG tablet  Commonly known as:  RHEUMATREX  Take 10 mg by mouth once a week. Saturday     mometasone-formoterol 200-5 MCG/ACT Aero  Commonly known as:  DULERA  Inhale 2 puffs into the lungs 2 (two) times daily.     pantoprazole 40 MG tablet  Commonly known as:  PROTONIX  Take 1 tablet (40 mg total) by mouth 2 (two) times daily.     predniSONE 10 MG tablet  Commonly known as:  DELTASONE  Take 10 mg by mouth 2 (two) times daily.     predniSONE 10 MG tablet  Commonly known as:  DELTASONE  Take 4 daily for 3 days then 3 daily for 3 days then 2 dialy     ranitidine 300 MG tablet  Commonly known as:  ZANTAC  Take 300 mg by mouth at bedtime.        Discharged Condition: Improved    Consults: None  Significant Diagnostic Studies: Dg Esophagus  01/04/2013   CLINICAL DATA:  Dysphagia. Mid epigastric pain and esophageal pain after meals. History of dilatation  earlier this year.  EXAM: ESOPHOGRAM / BARIUM SWALLOW / BARIUM TABLET STUDY  TECHNIQUE: Combined double contrast and single contrast examination performed using effervescent crystals, thick barium liquid, and thin barium liquid. The patient was observed with fluoroscopy swallowing a 13mm barium sulphate tablet.  COMPARISON:  None.  FLUOROSCOPY TIME:  1 Min and 18 seconds.  FINDINGS: Swallowing mechanism is normal. There is poor primary esophageal peristalsis. No esophageal fold thickening, stricture or obstruction. A 13 mm barium pill passed into the stomach without difficulty.  IMPRESSION: Esophageal dysmotility.   Electronically Signed   By: Leanna Battles M.D.   On: 01/04/2013 15:47   Nm Pulmonary Perf And Vent  01/04/2013   CLINICAL DATA:  Difficulty breathing  EXAM: NUCLEAR MEDICINE VENTILATION - PERFUSION LUNG SCAN  COMPARISON:  Chest radiograph  January 03, 2013  FINDINGS: On the ventilation study, there is patchy decreased uptake of radiotracer in portions of both upper lobes consistent with the underlying emphysema.  On the perfusion study, there are matching defects in both upper lobes, particularly in the right upper lobe consistent with the bullous emphysematous change seen on the chest radiograph.  There is no appreciable ventilation/ perfusion mismatch.  IMPRESSION: Underlying emphysema with matching ventilation and perfusion defects in both upper lobes. There is a bulla in the right upper lobe with diminished ventilation and perfusion in a matching pattern. There is no appreciable ventilation/ perfusion mismatch. This combination of findings is consistent with a low probability of pulmonary embolism in the face of underlying emphysematous change.  VIEWS:  ANTERIOR, POSTERIOR, LEFT LATERAL, RIGHT LATERAL, RPO, LPO, RAO, LAO -VENTILATION AND PERFUSION: VIEWS: ANTERIOR, POSTERIOR, LEFT LATERAL, RIGHT LATERAL, RPO, LPO, RAO, LAO -VENTILATION AND PERFUSION  Radionuclide: Technetium 75m DTPA -ventilation; Technetium 24m macro aggregated albumin-perfusion  Dose: 40.0 mCi-ventilation; 6.0 mCi -perfusion  Route of administration: Inhalation -ventilation; intravenous -perfusion   Electronically Signed   By: Bretta Bang M.D.   On: 01/04/2013 08:05   Dg Chest Port 1 View  01/16/2013   CLINICAL DATA:  Shortness of breath and weakness.  EXAM: PORTABLE CHEST - 1 VIEW  COMPARISON:  01/03/2013 and 11/2012  FINDINGS: Lordotic technique is demonstrated as the lungs are adequately inflated with subtle prominence of the perihilar markings suggesting minimal vascular congestion. Mild stable cardiomegaly. Remainder of the exam is unchanged.  IMPRESSION: Mild stable cardiomegaly. Suggestion of minimal vascular congestion.   Electronically Signed   By: Elberta Fortis M.D.   On: 01/16/2013 09:32   Dg Chest Portable 1 View  01/03/2013   CLINICAL DATA:  Shortness  of breath.  EXAM: PORTABLE CHEST - 1 VIEW  COMPARISON:  November 28, 2012.  FINDINGS: The heart size and mediastinal contours are within normal limits. Both lungs are clear. The visualized skeletal structures are unremarkable.  IMPRESSION: No active disease.   Electronically Signed   By: Roque Lias M.D.   On: 01/03/2013 19:13    Lab Results: Basic Metabolic Panel: No results found for this basename: NA, K, CL, CO2, GLUCOSE, BUN, CREATININE, CALCIUM, MG, PHOS,  in the last 72 hours Liver Function Tests: No results found for this basename: AST, ALT, ALKPHOS, BILITOT, PROT, ALBUMIN,  in the last 72 hours   CBC: No results found for this basename: WBC, NEUTROABS, HGB, HCT, MCV, PLT,  in the last 72 hours  Recent Results (from the past 240 hour(s))  MRSA PCR SCREENING     Status: None   Collection Time    01/16/13  3:48 PM  Result Value Range Status   MRSA by PCR NEGATIVE  NEGATIVE Final   Comment:            The GeneXpert MRSA Assay (FDA     approved for NASAL specimens     only), is one component of a     comprehensive MRSA colonization     surveillance program. It is not     intended to diagnose MRSA     infection nor to guide or     monitor treatment for     MRSA infections.     Hospital Course: He was admitted with COPD exacerbation. He developed increasing cough and congestion. He has evidence of congestive heart failure as well and has edema but he did not have clinical evidence he was having acute problems with congestive heart failure. He said he went to see his father in the air was very cold and he almost immediately started having shortness of breath. He was treated with intravenous steroids inhaled bronchodilators and antibiotics. He had a swallow evaluation which showed esophageal dysmotility. He improved rapidly and was back at baseline at the time of discharge  Discharge Exam: Blood pressure 121/67, pulse 99, temperature 98.2 F (36.8 C), temperature source Oral,  resp. rate 18, height 5\' 9"  (1.753 m), weight 104.463 kg (230 lb 4.8 oz), SpO2 94.00%. He is awake and alert. His chest is clear. His heart is regular.  Disposition: Home with extensive home health services      Discharge Orders   Future Appointments Provider Department Dept Phone   02/09/2013 3:15 PM Corbin Ade, MD [facility] [phone]   02/17/2013 11:20 AM Laqueta Linden, MD [facility] [phone]   Future Orders Complete By Expires   Discharge patient  As directed    Face-to-face encounter (required for Medicare/Medicaid patients)  As directed    Comments:     I Darra Rosa L certify that this patient is under my care and that I, or a nurse practitioner or physician's assistant working with me, had a face-to-face encounter that meets the physician face-to-face encounter requirements with this patient on 01/19/2013. The encounter with the patient was in whole, or in part for the following medical condition(s) which is the primary reason for home health care (List medical condition): copd   Questions:     The encounter with the patient was in whole, or in part, for the following medical condition, which is the primary reason for home health care:  copd   I certify that, based on my findings, the following services are medically necessary home health services:  Nursing   Physical therapy   My clinical findings support the need for the above services:  Shortness of breath with activity   Further, I certify that my clinical findings support that this patient is homebound due to:  Shortness of Breath with activity   Reason for Medically Necessary Home Health Services:  Skilled Nursing- Change/Decline in Patient Status   Home Health  As directed    Questions:     To provide the following care/treatments:  PT   RN      Follow-up Information   Follow up with [facility].   Contact information:   [address] [place]  [place] [address] [phone]      Signed: Fredirick Maudlin Pager [phone]  01/22/2013, 8:02 AM

## 2013-01-27 ENCOUNTER — Encounter: Payer: Medicare HMO | Admitting: Internal Medicine

## 2013-02-09 ENCOUNTER — Encounter: Payer: Medicare HMO | Admitting: Internal Medicine

## 2013-02-13 ENCOUNTER — Inpatient Hospital Stay (HOSPITAL_COMMUNITY)
Admission: EM | Admit: 2013-02-13 | Discharge: 2013-03-11 | DRG: 870 | Disposition: E | Payer: Medicare HMO | Attending: Pulmonary Disease | Admitting: Pulmonary Disease

## 2013-02-13 ENCOUNTER — Emergency Department (HOSPITAL_COMMUNITY): Payer: Medicare HMO

## 2013-02-13 ENCOUNTER — Encounter (HOSPITAL_COMMUNITY): Payer: Self-pay | Admitting: Emergency Medicine

## 2013-02-13 ENCOUNTER — Other Ambulatory Visit: Payer: Self-pay

## 2013-02-13 DIAGNOSIS — I509 Heart failure, unspecified: Secondary | ICD-10-CM | POA: Diagnosis present

## 2013-02-13 DIAGNOSIS — A0472 Enterocolitis due to Clostridium difficile, not specified as recurrent: Secondary | ICD-10-CM | POA: Diagnosis present

## 2013-02-13 DIAGNOSIS — K560 Paralytic ileus: Secondary | ICD-10-CM | POA: Diagnosis not present

## 2013-02-13 DIAGNOSIS — R509 Fever, unspecified: Secondary | ICD-10-CM

## 2013-02-13 DIAGNOSIS — J9600 Acute respiratory failure, unspecified whether with hypoxia or hypercapnia: Secondary | ICD-10-CM

## 2013-02-13 DIAGNOSIS — Z79899 Other long term (current) drug therapy: Secondary | ICD-10-CM

## 2013-02-13 DIAGNOSIS — A419 Sepsis, unspecified organism: Secondary | ICD-10-CM | POA: Diagnosis not present

## 2013-02-13 DIAGNOSIS — E870 Hyperosmolality and hypernatremia: Secondary | ICD-10-CM | POA: Diagnosis not present

## 2013-02-13 DIAGNOSIS — Z9911 Dependence on respirator [ventilator] status: Secondary | ICD-10-CM

## 2013-02-13 DIAGNOSIS — J209 Acute bronchitis, unspecified: Secondary | ICD-10-CM

## 2013-02-13 DIAGNOSIS — J690 Pneumonitis due to inhalation of food and vomit: Secondary | ICD-10-CM | POA: Diagnosis present

## 2013-02-13 DIAGNOSIS — I472 Ventricular tachycardia, unspecified: Secondary | ICD-10-CM | POA: Diagnosis not present

## 2013-02-13 DIAGNOSIS — R600 Localized edema: Secondary | ICD-10-CM | POA: Diagnosis present

## 2013-02-13 DIAGNOSIS — D649 Anemia, unspecified: Secondary | ICD-10-CM | POA: Diagnosis not present

## 2013-02-13 DIAGNOSIS — J441 Chronic obstructive pulmonary disease with (acute) exacerbation: Secondary | ICD-10-CM

## 2013-02-13 DIAGNOSIS — E119 Type 2 diabetes mellitus without complications: Secondary | ICD-10-CM | POA: Diagnosis present

## 2013-02-13 DIAGNOSIS — G9340 Encephalopathy, unspecified: Secondary | ICD-10-CM | POA: Diagnosis present

## 2013-02-13 DIAGNOSIS — E876 Hypokalemia: Secondary | ICD-10-CM | POA: Diagnosis not present

## 2013-02-13 DIAGNOSIS — F411 Generalized anxiety disorder: Secondary | ICD-10-CM | POA: Diagnosis not present

## 2013-02-13 DIAGNOSIS — J9620 Acute and chronic respiratory failure, unspecified whether with hypoxia or hypercapnia: Secondary | ICD-10-CM

## 2013-02-13 DIAGNOSIS — F1210 Cannabis abuse, uncomplicated: Secondary | ICD-10-CM | POA: Diagnosis present

## 2013-02-13 DIAGNOSIS — I4729 Other ventricular tachycardia: Secondary | ICD-10-CM | POA: Diagnosis not present

## 2013-02-13 DIAGNOSIS — N182 Chronic kidney disease, stage 2 (mild): Secondary | ICD-10-CM | POA: Diagnosis present

## 2013-02-13 DIAGNOSIS — I4891 Unspecified atrial fibrillation: Secondary | ICD-10-CM | POA: Diagnosis not present

## 2013-02-13 DIAGNOSIS — N179 Acute kidney failure, unspecified: Secondary | ICD-10-CM

## 2013-02-13 DIAGNOSIS — E875 Hyperkalemia: Secondary | ICD-10-CM | POA: Diagnosis present

## 2013-02-13 DIAGNOSIS — J9690 Respiratory failure, unspecified, unspecified whether with hypoxia or hypercapnia: Secondary | ICD-10-CM

## 2013-02-13 DIAGNOSIS — N289 Disorder of kidney and ureter, unspecified: Secondary | ICD-10-CM

## 2013-02-13 DIAGNOSIS — I5032 Chronic diastolic (congestive) heart failure: Secondary | ICD-10-CM

## 2013-02-13 DIAGNOSIS — I5033 Acute on chronic diastolic (congestive) heart failure: Secondary | ICD-10-CM

## 2013-02-13 DIAGNOSIS — J449 Chronic obstructive pulmonary disease, unspecified: Secondary | ICD-10-CM

## 2013-02-13 DIAGNOSIS — Z87891 Personal history of nicotine dependence: Secondary | ICD-10-CM

## 2013-02-13 DIAGNOSIS — I129 Hypertensive chronic kidney disease with stage 1 through stage 4 chronic kidney disease, or unspecified chronic kidney disease: Secondary | ICD-10-CM | POA: Diagnosis present

## 2013-02-13 DIAGNOSIS — G4733 Obstructive sleep apnea (adult) (pediatric): Secondary | ICD-10-CM | POA: Diagnosis present

## 2013-02-13 LAB — APTT: aPTT: 24 seconds (ref 24–37)

## 2013-02-13 LAB — CBC
HCT: 42.9 % (ref 39.0–52.0)
Hemoglobin: 13.7 g/dL (ref 13.0–17.0)
RDW: 14.7 % (ref 11.5–15.5)
WBC: 10.7 10*3/uL — ABNORMAL HIGH (ref 4.0–10.5)

## 2013-02-13 LAB — PROTIME-INR
INR: 0.98 (ref 0.00–1.49)
Prothrombin Time: 12.8 seconds (ref 11.6–15.2)

## 2013-02-13 LAB — PRO B NATRIURETIC PEPTIDE: Pro B Natriuretic peptide (BNP): 2136 pg/mL — ABNORMAL HIGH (ref 0–125)

## 2013-02-13 LAB — BASIC METABOLIC PANEL
BUN: 20 mg/dL (ref 6–23)
Calcium: 8.7 mg/dL (ref 8.4–10.5)
GFR calc Af Amer: 54 mL/min — ABNORMAL LOW (ref >90)
GFR calc non Af Amer: 47 mL/min — ABNORMAL LOW (ref >90)
Potassium: 4.1 mEq/L (ref 3.5–5.1)
Sodium: 139 mEq/L (ref 135–145)

## 2013-02-13 MED ORDER — PROPOFOL 10 MG/ML IV EMUL
5.0000 ug/kg/min | Freq: Once | INTRAVENOUS | Status: DC
  Administered 2013-02-13: 40 ug/kg/min via INTRAVENOUS
  Administered 2013-02-14: 1000 mg via INTRAVENOUS

## 2013-02-13 MED ORDER — NITROGLYCERIN IN D5W 200-5 MCG/ML-% IV SOLN
5.0000 ug/min | Freq: Once | INTRAVENOUS | Status: AC
  Administered 2013-02-13: 50 ug/min via INTRAVENOUS

## 2013-02-13 MED ORDER — PROPOFOL 10 MG/ML IV EMUL
INTRAVENOUS | Status: AC
  Filled 2013-02-13: qty 100

## 2013-02-13 MED ORDER — SODIUM CHLORIDE 0.9 % IV SOLN: 20.0000 mL | INTRAVENOUS | Status: DC

## 2013-02-13 MED ORDER — METHYLPREDNISOLONE SODIUM SUCC 125 MG IJ SOLR
125.0000 mg | Freq: Once | INTRAMUSCULAR | Status: AC
  Administered 2013-02-13: 125 mg via INTRAVENOUS
  Filled 2013-02-13: qty 2

## 2013-02-13 MED ORDER — ASPIRIN 300 MG RE SUPP
325.0000 mg | Freq: Once | RECTAL | Status: AC
  Administered 2013-02-14: 300 mg via RECTAL
  Filled 2013-02-13: qty 1

## 2013-02-13 MED ORDER — ALBUTEROL (5 MG/ML) CONTINUOUS INHALATION SOLN
10.0000 mg/h | INHALATION_SOLUTION | RESPIRATORY_TRACT | Status: AC
  Administered 2013-02-13: 10 mg/h via RESPIRATORY_TRACT
  Filled 2013-02-13: qty 20

## 2013-02-13 MED ORDER — SODIUM CHLORIDE 0.9 % IV BOLUS (SEPSIS)
1000.0000 mL | Freq: Once | INTRAVENOUS | Status: AC
  Administered 2013-02-14: 1000 mL via INTRAVENOUS

## 2013-02-13 MED ORDER — ASPIRIN 81 MG PO CHEW: 324.0000 mg | CHEWABLE_TABLET | Freq: Once | ORAL | Status: DC

## 2013-02-13 MED ORDER — FUROSEMIDE 10 MG/ML IJ SOLN
40.0000 mg | INTRAMUSCULAR | Status: AC
  Administered 2013-02-13: 40 mg via INTRAVENOUS
  Filled 2013-02-13: qty 4

## 2013-02-13 MED ORDER — VANCOMYCIN HCL IN DEXTROSE 1-5 GM/200ML-% IV SOLN
1000.0000 mg | Freq: Once | INTRAVENOUS | Status: AC
  Administered 2013-02-14: 1000 mg via INTRAVENOUS
  Filled 2013-02-13: qty 200

## 2013-02-13 MED ORDER — PIPERACILLIN-TAZOBACTAM 3.375 G IVPB
3.3750 g | Freq: Once | INTRAVENOUS | Status: AC
  Administered 2013-02-14: 3.375 g via INTRAVENOUS
  Filled 2013-02-13: qty 50

## 2013-02-13 MED ORDER — NITROGLYCERIN IN D5W 200-5 MCG/ML-% IV SOLN
INTRAVENOUS | Status: AC
  Filled 2013-02-13: qty 250

## 2013-02-13 NOTE — ED Notes (Signed)
Patient c/o shortness of breath that has been getting worse for the past few days.  Patient drove himself to store and had to sit in car to get his breath.  Patient on CPAP on arrival.

## 2013-02-13 NOTE — ED Notes (Signed)
Temp per temp foley up to 101.6   Dr Hyacinth Meeker notified and in room, aware of BP also

## 2013-02-13 NOTE — ED Notes (Signed)
Dr Hyacinth Meeker discussing intubation with patient - patient states yes - nods to affirmative- follows commands

## 2013-02-13 NOTE — ED Notes (Signed)
Etomidate 20 mg IV  Line right hand Followed by  Succinycholine 100 mg  Nitroglycerin drip started at 50 mcg IV right hand IV  Diprivan drip started at 30 mcg in IV right AC

## 2013-02-13 NOTE — ED Provider Notes (Signed)
CSN: [id number]     Arrival date & time 02/21/2013  2157 History  This chart was scribed for Vida Roller, MD by Quintella Reichert, ED scribe.  This patient was seen in room APA01/APA01 and the patient's care was started at 10:06 PM.   Chief Complaint  Patient presents with  . Shortness of Breath    HPI Comments: Level 5 caveat applies secondary to respiratory distress and acuity of illness.  The history is provided by the patient. No language interpreter was used.    Level 5 Caveat: Respiratory Distress  HPI Comments: MONTRELL CESSNA is a 57 y.o. male with h/o COPD, CHF, and stage 2 CKD brought in by EMS to the Emergency Department complaining of severe SOB.  On arrival pt is in severe respiratory distress with CPAP in place but he is able to nod and gesture with his hands.  He states his symptoms began 2 days ago and he confirms that he has had progressively-worsening SOB with associated increased swelling.  He has required intubation before.  He is a former smoker.   Past Medical History  Diagnosis Date  . Hypertension   . Dyspnea   . Chronic kidney disease, stage 2, mildly decreased GFR     Creatinine of 1.31 in 04/2011  . COPD (chronic obstructive pulmonary disease)     moderate by spirometry in 08/2009;FEV1 of 1.1 ;nl alpha -1 antitrypsin   . Obesity   . Psoriasis   . Left eye trauma     with loss of vision  . Erectile dysfunction   . Lumbosacral spinal stenosis     chronic low back pain  . Headache(784.0)   . Sleep apnea     O2 at night  . Carotid artery aneurysm   . Asthma   . Psoriasis   . CHF (congestive heart failure)   . Renal insufficiency     Past Surgical History  Procedure Laterality Date  . Orif patella  12/14/2005    left fracture Dr.Harrison   . Ophthalmologic surgery  1963    secondary to left eye trauma, as a child hit in eye with tree limb   . Colonoscopy N/A 05/06/2012    RMR: internal hemorrhoids, tubular adenoma, cecal ulcerations with benign  path, repeat in 2019  . Esophagogastroduodenoscopy  05/06/12    RMR: probably cervical esophageal web s/p 55 F dilation, hiatal hernia, antral and bulbar erosions with negative H.pylori    Family History  Problem Relation Age of Onset  . Heart disease    . Arthritis    . Lung disease    . Cancer    . Asthma    . Colon cancer Brother     deceased at 69   . Brain cancer Brother     History  Substance Use Topics  . Smoking status: Former Smoker -- 1.00 packs/day for 20 years    Quit date: 06/10/2010  . Smokeless tobacco: Former Neurosurgeon     Comment: Quit in 06/2010  . Alcohol Use: 0.5 oz/week    1 drink(s) per week     Comment: hx of ETOH abuse, none since hospitalization in December 2013      Review of Systems  Unable to perform ROS: Severe respiratory distress     Allergies  Bee venom  Home Medications   Current Outpatient Rx  Name  Route  Sig  Dispense  Refill  . acetaminophen-codeine (TYLENOL #3) 300-30 MG per tablet   Oral  Take 1 tablet by mouth every 4 (four) hours as needed for pain.   60 tablet   5   . albuterol (PROVENTIL HFA;VENTOLIN HFA) 108 (90 BASE) MCG/ACT inhaler   Inhalation   Inhale 2 puffs into the lungs as needed for wheezing (rescue inhaler).          Marland Kitchen albuterol (PROVENTIL) (2.5 MG/3ML) 0.083% nebulizer solution   Nebulization   Take 2.5 mg by nebulization every 6 (six) hours as needed for wheezing or shortness of breath.         . cloNIDine (CATAPRES) 0.2 MG tablet   Oral   Take 0.2 mg by mouth 3 (three) times daily.         Marland Kitchen docusate sodium (COLACE) 100 MG capsule   Oral   Take 100 mg by mouth 2 (two) times daily as needed for mild constipation.         . fluticasone (CUTIVATE) 0.05 % cream   Topical   Apply 1 application topically daily as needed (rash).          . furosemide (LASIX) 40 MG tablet   Oral   Take 1 tablet (40 mg total) by mouth 2 (two) times daily.   30 tablet   5   . guaiFENesin (MUCINEX) 600 MG 12 hr  tablet   Oral   Take 1,200 mg by mouth at bedtime as needed for to loosen phlegm.          Marland Kitchen HYDROcodone-acetaminophen (NORCO/VICODIN) 5-325 MG per tablet   Oral   Take 1 tablet by mouth every 6 (six) hours as needed for pain.   120 tablet   0   . hydrocortisone (ANUSOL-HC) 25 MG suppository   Rectal   Place 25 mg rectally daily as needed for hemorrhoids.         Marland Kitchen levofloxacin (LEVAQUIN) 500 MG tablet   Oral   Take 1 tablet (500 mg total) by mouth daily.   7 tablet   0   . lisinopril (PRINIVIL,ZESTRIL) 40 MG tablet   Oral   Take 40 mg by mouth daily.          . methotrexate (RHEUMATREX) 2.5 MG tablet   Oral   Take 10 mg by mouth once a week. Saturday         . mometasone-formoterol (DULERA) 200-5 MCG/ACT AERO   Inhalation   Inhale 2 puffs into the lungs 2 (two) times daily.   1 Inhaler   12   . pantoprazole (PROTONIX) 40 MG tablet   Oral   Take 1 tablet (40 mg total) by mouth 2 (two) times daily.   60 tablet   12   . predniSONE (DELTASONE) 10 MG tablet   Oral   Take 10 mg by mouth 2 (two) times daily.         . predniSONE (DELTASONE) 10 MG tablet      Take 4 daily for 3 days then 3 daily for 3 days then 2 dialy   100 tablet   1   . ranitidine (ZANTAC) 300 MG tablet   Oral   Take 300 mg by mouth at bedtime.          BP 207/118  Pulse 168  Resp 31  SpO2 90%  Physical Exam  Nursing note and vitals reviewed. Constitutional: He is oriented to person, place, and time. He appears well-developed and well-nourished. He appears distressed.  HENT:  Head: Normocephalic and atraumatic.  Mouth/Throat: Oropharynx is clear  and moist.  Eyes: Conjunctivae are normal. Pupils are equal, round, and reactive to light. No scleral icterus.  Neck: Neck supple.  Cardiovascular: Normal rate, regular rhythm, normal heart sounds and intact distal pulses.   No murmur heard. Pulmonary/Chest: No stridor.  Severe respiratory distress, patient arrived on positive  airway pressure device, continue to use accessory muscles and had slight decrease in respiratory effort requiring intubation. Diffuse decreased lung sounds and expiratory wheezing with prolonged expiratory phase  Abdominal: Soft. He exhibits no distension. There is no tenderness.  Musculoskeletal: Normal range of motion. He exhibits edema (2+ peripheral pitting edema on both legs).  Neurological: He is alert and oriented to person, place, and time.  Skin: Skin is warm and dry. No rash noted.  Psychiatric: He has a normal mood and affect. His behavior is normal.    ED Course  ARTERIAL LINE Date/Time: 02/14/2013 12:05 AM Performed by: Eber Hong D Authorized by: Eber Hong D Consent: The procedure was performed in an emergent situation. Required items: required blood products, implants, devices, and special equipment available Patient identity confirmed: arm band Time out: Immediately prior to procedure a "time out" was called to verify the correct patient, procedure, equipment, support staff and site/side marked as required. Preparation: Patient was prepped and draped in the usual sterile fashion. Indications: multiple ABGs, respiratory failure and hemodynamic monitoring Location: right radial Patient sedated: yes Sedatives: propofol Vitals: Vital signs were monitored during sedation. Allen's test normal: yes Needle gauge: 22 Seldinger technique: Seldinger technique used Number of attempts: 2 Post-procedure: dressing applied Post-procedure CMS: normal Patient tolerance: Patient tolerated the procedure well with no immediate complications.   (including critical care time)  DIAGNOSTIC STUDIES: Oxygen Saturation is 90% on CPAP, low by my interpretation.    COORDINATION OF CARE: 10:11 PM: Pt's mental status sufficient to give consent for intubation.  Will intubate and order aspirin, IV lasix, nitroglycerin, EKG, CXR, and labs.   Labs Review Labs Reviewed  CBC - Abnormal;  Notable for the following:    WBC 10.7 (*)    All other components within normal limits  PRO B NATRIURETIC PEPTIDE - Abnormal; Notable for the following:    Pro B Natriuretic peptide (BNP) 2136.0 (*)    All other components within normal limits  BASIC METABOLIC PANEL - Abnormal; Notable for the following:    Chloride 94 (*)    Glucose, Bld 281 (*)    Creatinine, Ser 1.58 (*)    GFR calc non Af Amer 47 (*)    GFR calc Af Amer 54 (*)    All other components within normal limits  LACTIC ACID, PLASMA - Abnormal; Notable for the following:    Lactic Acid, Venous 2.9 (*)    All other components within normal limits  PROTIME-INR  APTT  BLOOD GAS, ARTERIAL    Imaging Review Dg Chest Portable 1 View  March 11, 2013   CLINICAL DATA:  Endotracheal tube placement.  Shortness of breath.  EXAM: PORTABLE CHEST - 1 VIEW  COMPARISON:  Chest radiograph performed 01/16/2013  FINDINGS: The patient's endotracheal tube is seen ending 5-6 cm above the carina.  The lungs are well aerated. Mild vascular congestion is noted. No focal consolidation is identified. No pleural effusion or pneumothorax is seen. Mild chronically increased interstitial markings are noted.  The cardiomediastinal silhouette is borderline normal in size. No acute osseous abnormalities are identified.  IMPRESSION: 1. Endotracheal tube seen ending 5-6 cm above the carina. 2. Mild vascular congestion noted; mild chronically increased  interstitial markings seen.   Electronically Signed   By: Roanna Raider M.D.   On: 02/11/2013 22:53    EKG Interpretation   None       MDM   1. Respiratory failure   2. COPD (chronic obstructive pulmonary disease)    Review of the medical record shows that the patient does have a history of congestive heart failure though had a recent echocardiogram showing a normal ejection fraction with ventricular hypertrophy. He has also had a ventilation perfusion scan in the last 2 years showing no pulmonary embolism  and Dopplers of the lower extremity showing no DVT. His chest x-ray today shows no obvious significant amount of pulmonary edema, his exam is consistent with severe COPD with a prolonged expiratory phase, severe respiratory distress, severe and significant accessory muscle usage. He also has bilateral significantly swollen and edematous lower extremities. Overall the patient is significantly ill, hypoxic, tachycardic and severely hypertensive with blood pressures over 250 systolic. He is requiring a nitroglycerin drip, a propofol drip for sedation, he has received rectal aspirin, IV Lasix and his cardiac monitoring, Foley catheter placement. His EKG shows a significant tachycardia which at this time I cannot easily explain unless the patient is septic from a pneumonia. He certainly is not in shock at this time with a blood pressure as high as it is however he may not been taking his medications including clonidine which could cause rebound hypertension.  Critical care is currently being provided. Multiple re\re evaluations of the patient have occurred digesting drip of nitroglycerin and propofol frequently, monitoring and oxygenation, interpreting ABG.  ED ECG REPORT  I personally interpreted this EKG   Date: 02/14/2013   Rate: 169  Rhythm: sinus tachycardia  QRS Axis: normal  Intervals: normal  ST/T Wave abnormalities: nonspecific ST/T changes  Conduction Disutrbances:none  Narrative Interpretation:   Old EKG Reviewed: since last tracing, rate increased  INTUBATION Performed by: Vida Roller  Required items: required blood products, implants, devices, and special equipment available Patient identity confirmed: provided demographic data and hospital-assigned identification number Time out: Immediately prior to procedure a "time out" was called to verify the correct patient, procedure, equipment, support staff and site/side marked as required.  Indications: Severe respiratory distress, hypoxia    Intubation method: Direct laryngoscopy    Preoxygenation: BVM, CPAP   Sedatives: 20 mg Etomidate Paralytic: 100 mg Succinylcholine  Tube Size: 8 cuffed  Post-procedure assessment: chest rise and ETCO2 monitor Breath sounds: equal and absent over the epigastrium Tube secured with: ETT holder Chest x-ray interpreted by radiologist and me.  Chest x-ray findings: endotracheal tube in appropriate position  Patient tolerated the procedure well with no immediate complications.  11:00 PM, patient reexamined again, temperature Foley states that the patient has a temperature of 101.5, tachycardia is slowly improving with endotracheal intubation, positive airway pressure, nebulize treatments. X-rays had been examined by myself as well as the radiologist and do not appear to show acute infiltrates or pulmonary edema of any significance. The blood pressure has dropped with nitroglycerin and propofol and last measurement was just under 100 systolic, because of this acute drop a small IV fluid bolus was given and nitroglycerin was decreased. The patient remains in a sinus tachycardia without any other significant arrhythmias.  Laboratory work shows blood count of 10,700, renal function is at baseline at 1.6 creatinine, no other significant electrolyte allergies.  D/w Dr. Rito Ehrlich who will admit - broad spec abx ordered  CRITICAL CARE Performed by: Vida Roller Total  critical care time: 35 Critical care time was exclusive of separately billable procedures and treating other patients. Critical care was necessary to treat or prevent imminent or life-threatening deterioration. Critical care was time spent personally by me on the following activities: development of treatment plan with patient and/or surrogate as well as nursing, discussions with consultants, evaluation of patient's response to treatment, examination of patient, obtaining history from patient or surrogate, ordering and performing  treatments and interventions, ordering and review of laboratory studies, ordering and review of radiographic studies, pulse oximetry and re-evaluation of patient's condition.   I personally performed the services described in this documentation, which was scribed in my presence. The recorded information has been reviewed and is accurate.      Vida Roller, MD 02/14/13 [phone]

## 2013-02-13 NOTE — ED Notes (Signed)
Nitroglycerine stopped and fluid bolus 500 ml started

## 2013-02-14 ENCOUNTER — Inpatient Hospital Stay (HOSPITAL_COMMUNITY): Payer: Medicare HMO

## 2013-02-14 ENCOUNTER — Encounter (HOSPITAL_COMMUNITY): Payer: Self-pay | Admitting: Internal Medicine

## 2013-02-14 DIAGNOSIS — J441 Chronic obstructive pulmonary disease with (acute) exacerbation: Secondary | ICD-10-CM

## 2013-02-14 DIAGNOSIS — J9600 Acute respiratory failure, unspecified whether with hypoxia or hypercapnia: Secondary | ICD-10-CM | POA: Diagnosis present

## 2013-02-14 DIAGNOSIS — I5032 Chronic diastolic (congestive) heart failure: Secondary | ICD-10-CM

## 2013-02-14 DIAGNOSIS — R509 Fever, unspecified: Secondary | ICD-10-CM

## 2013-02-14 LAB — BLOOD GAS, ARTERIAL
Acid-Base Excess: 5.4 mmol/L — ABNORMAL HIGH (ref 0.0–2.0)
Bicarbonate: 29.9 mEq/L — ABNORMAL HIGH (ref 20.0–24.0)
Drawn by: 213101
MECHVT: 650 mL
MECHVT: 650 mL
O2 Saturation: 100 %
PEEP: 5 cmH2O
PEEP: 5 cmH2O
Patient temperature: 37
RATE: 14 resp/min
TCO2: 26.7 mmol/L (ref 0–100)
pCO2 arterial: 44.7 mmHg (ref 35.0–45.0)
pCO2 arterial: 51.6 mmHg — ABNORMAL HIGH (ref 35.0–45.0)
pH, Arterial: 7.385 (ref 7.350–7.450)
pH, Arterial: 7.441 (ref 7.350–7.450)
pO2, Arterial: 430 mmHg — ABNORMAL HIGH (ref 80.0–100.0)

## 2013-02-14 LAB — BASIC METABOLIC PANEL
BUN: 21 mg/dL (ref 6–23)
Calcium: 8.5 mg/dL (ref 8.4–10.5)
Creatinine, Ser: 1.77 mg/dL — ABNORMAL HIGH (ref 0.50–1.35)
GFR calc Af Amer: 47 mL/min — ABNORMAL LOW (ref >90)
GFR calc non Af Amer: 41 mL/min — ABNORMAL LOW (ref >90)
Glucose, Bld: 165 mg/dL — ABNORMAL HIGH (ref 70–99)

## 2013-02-14 LAB — CBC
HCT: 37 % — ABNORMAL LOW (ref 39.0–52.0)
Hemoglobin: 11.9 g/dL — ABNORMAL LOW (ref 13.0–17.0)
MCHC: 32.2 g/dL (ref 30.0–36.0)
MCV: 96.9 fL (ref 78.0–100.0)
RDW: 14.8 % (ref 11.5–15.5)

## 2013-02-14 LAB — POCT I-STAT TROPONIN I: Troponin i, poc: 0.11 ng/mL (ref 0.00–0.08)

## 2013-02-14 LAB — INFLUENZA PANEL BY PCR (TYPE A & B)
H1N1 flu by pcr: NEGATIVE — AB
Influenza A By PCR: NEGATIVE — AB

## 2013-02-14 LAB — TRIGLYCERIDES: Triglycerides: 214 mg/dL — ABNORMAL HIGH (ref <150)

## 2013-02-14 LAB — MRSA PCR SCREENING: MRSA by PCR: NEGATIVE

## 2013-02-14 MED ORDER — SODIUM CHLORIDE 0.9 % IV SOLN
250.0000 mL | INTRAVENOUS | Status: DC | PRN
  Administered 2013-02-14: 250 mL via INTRAVENOUS

## 2013-02-14 MED ORDER — IPRATROPIUM BROMIDE 0.02 % IN SOLN
RESPIRATORY_TRACT | Status: AC
  Filled 2013-02-14: qty 2.5

## 2013-02-14 MED ORDER — BIOTENE DRY MOUTH MT LIQD
15.0000 mL | Freq: Four times a day (QID) | OROMUCOSAL | Status: DC
  Administered 2013-02-14 – 2013-02-28 (×57): 15 mL via OROMUCOSAL

## 2013-02-14 MED ORDER — METHYLPREDNISOLONE SODIUM SUCC 125 MG IJ SOLR
60.0000 mg | Freq: Four times a day (QID) | INTRAMUSCULAR | Status: DC
  Administered 2013-02-14 – 2013-02-17 (×13): 60 mg via INTRAVENOUS
  Filled 2013-02-14 (×13): qty 2

## 2013-02-14 MED ORDER — FUROSEMIDE 10 MG/ML IJ SOLN
40.0000 mg | Freq: Two times a day (BID) | INTRAMUSCULAR | Status: DC
  Administered 2013-02-14 – 2013-02-19 (×12): 40 mg via INTRAVENOUS
  Filled 2013-02-14 (×12): qty 4

## 2013-02-14 MED ORDER — VANCOMYCIN HCL 10 G IV SOLR
1500.0000 mg | Freq: Once | INTRAVENOUS | Status: DC
  Filled 2013-02-14: qty 1500

## 2013-02-14 MED ORDER — PROPOFOL 10 MG/ML IV EMUL
0.0000 ug/kg/min | INTRAVENOUS | Status: DC
  Administered 2013-02-14 (×2): 40 ug/kg/min via INTRAVENOUS
  Administered 2013-02-14 (×4): 50 ug/kg/min via INTRAVENOUS
  Administered 2013-02-14: 40 ug/kg/min via INTRAVENOUS
  Administered 2013-02-15: 50 ug/kg/min via INTRAVENOUS
  Administered 2013-02-15: 40 ug/kg/min via INTRAVENOUS
  Administered 2013-02-15 (×2): 50 ug/kg/min via INTRAVENOUS
  Administered 2013-02-15: 40 ug/kg/min via INTRAVENOUS
  Administered 2013-02-15: 50 ug/kg/min via INTRAVENOUS
  Administered 2013-02-15: 40 ug/kg/min via INTRAVENOUS
  Administered 2013-02-16: 50 ug/kg/min via INTRAVENOUS
  Administered 2013-02-16: 35 ug/kg/min via INTRAVENOUS
  Administered 2013-02-16 (×2): 50 ug/kg/min via INTRAVENOUS
  Administered 2013-02-16: 55 ug/kg/min via INTRAVENOUS
  Administered 2013-02-16 (×2): 50 ug/kg/min via INTRAVENOUS
  Administered 2013-02-17 (×5): 40 ug/kg/min via INTRAVENOUS
  Administered 2013-02-17: 50 ug/kg/min via INTRAVENOUS
  Administered 2013-02-18 (×6): 40 ug/kg/min via INTRAVENOUS
  Administered 2013-02-19: 10 ug/kg/min via INTRAVENOUS
  Administered 2013-02-19 (×2): 40 ug/kg/min via INTRAVENOUS
  Filled 2013-02-14 (×4): qty 100
  Filled 2013-02-14: qty 300
  Filled 2013-02-14 (×8): qty 100
  Filled 2013-02-14: qty 500
  Filled 2013-02-14: qty 400
  Filled 2013-02-14 (×3): qty 100
  Filled 2013-02-14: qty 400
  Filled 2013-02-14 (×2): qty 100
  Filled 2013-02-14: qty 200
  Filled 2013-02-14 (×4): qty 100

## 2013-02-14 MED ORDER — VANCOMYCIN HCL IN DEXTROSE 1-5 GM/200ML-% IV SOLN
1000.0000 mg | Freq: Once | INTRAVENOUS | Status: AC
  Administered 2013-02-14: 1000 mg via INTRAVENOUS
  Filled 2013-02-14: qty 200

## 2013-02-14 MED ORDER — HEPARIN SODIUM (PORCINE) 5000 UNIT/ML IJ SOLN
5000.0000 [IU] | Freq: Three times a day (TID) | INTRAMUSCULAR | Status: DC
  Administered 2013-02-14 – 2013-02-23 (×29): 5000 [IU] via SUBCUTANEOUS
  Filled 2013-02-14 (×29): qty 1

## 2013-02-14 MED ORDER — PROPOFOL 10 MG/ML IV EMUL
INTRAVENOUS | Status: AC
  Administered 2013-02-14: 1000 mg via INTRAVENOUS
  Filled 2013-02-14: qty 100

## 2013-02-14 MED ORDER — IPRATROPIUM BROMIDE 0.02 % IN SOLN
0.5000 mg | RESPIRATORY_TRACT | Status: DC
  Administered 2013-02-14 – 2013-02-27 (×77): 0.5 mg via RESPIRATORY_TRACT
  Filled 2013-02-14 (×74): qty 2.5

## 2013-02-14 MED ORDER — CHLORHEXIDINE GLUCONATE 0.12 % MT SOLN
15.0000 mL | Freq: Two times a day (BID) | OROMUCOSAL | Status: DC
  Administered 2013-02-14 – 2013-02-27 (×28): 15 mL via OROMUCOSAL
  Filled 2013-02-14 (×28): qty 15

## 2013-02-14 MED ORDER — LEVALBUTEROL HCL 0.63 MG/3ML IN NEBU
0.6300 mg | INHALATION_SOLUTION | RESPIRATORY_TRACT | Status: DC
  Administered 2013-02-14 – 2013-02-27 (×77): 0.63 mg via RESPIRATORY_TRACT
  Filled 2013-02-14 (×84): qty 3

## 2013-02-14 MED ORDER — VANCOMYCIN HCL IN DEXTROSE 1-5 GM/200ML-% IV SOLN
INTRAVENOUS | Status: AC
  Filled 2013-02-14: qty 200

## 2013-02-14 MED ORDER — SODIUM CHLORIDE 0.9 % IV SOLN
1500.0000 mg | INTRAVENOUS | Status: DC
  Administered 2013-02-14 – 2013-02-17 (×4): 1500 mg via INTRAVENOUS
  Filled 2013-02-14 (×4): qty 1500

## 2013-02-14 MED ORDER — PANTOPRAZOLE SODIUM 40 MG IV SOLR
40.0000 mg | Freq: Every day | INTRAVENOUS | Status: DC
  Administered 2013-02-14 – 2013-02-25 (×12): 40 mg via INTRAVENOUS
  Filled 2013-02-14 (×13): qty 40

## 2013-02-14 MED ORDER — POTASSIUM CHLORIDE 10 MEQ/100ML IV SOLN
10.0000 meq | INTRAVENOUS | Status: AC
  Administered 2013-02-14 (×4): 10 meq via INTRAVENOUS
  Filled 2013-02-14: qty 100

## 2013-02-14 MED ORDER — SODIUM CHLORIDE 0.9 % IV SOLN
INTRAVENOUS | Status: DC
  Administered 2013-02-14 – 2013-02-16 (×3): via INTRAVENOUS

## 2013-02-14 MED ORDER — FENTANYL CITRATE 0.05 MG/ML IJ SOLN
100.0000 ug | INTRAMUSCULAR | Status: DC | PRN
  Administered 2013-02-14 – 2013-02-25 (×13): 100 ug via INTRAVENOUS
  Filled 2013-02-14 (×9): qty 2

## 2013-02-14 MED ORDER — PIPERACILLIN-TAZOBACTAM 3.375 G IVPB
3.3750 g | Freq: Three times a day (TID) | INTRAVENOUS | Status: DC
  Administered 2013-02-14 – 2013-02-21 (×22): 3.375 g via INTRAVENOUS
  Filled 2013-02-14 (×25): qty 50

## 2013-02-14 MED ORDER — LEVALBUTEROL HCL 0.63 MG/3ML IN NEBU
INHALATION_SOLUTION | RESPIRATORY_TRACT | Status: AC
  Filled 2013-02-14: qty 3

## 2013-02-14 NOTE — Progress Notes (Signed)
Subjective: He was admitted yesterday with healthcare associated pneumonia and ventilator dependent respiratory failure. He remains intubated and on the ventilator.  Objective: Vital signs in last 24 hours: Temp:  [97 F (36.1 C)-101.6 F (38.7 C)] 97.8 F (36.6 C) (12/07 0430) Pulse Rate:  [26-168] 118 (12/07 0900) Resp:  [14-31] 14 (12/07 0900) BP: (74-207)/(37-119) 122/64 mmHg (12/07 0900) SpO2:  [84 %-100 %] 100 % (12/07 0900) Arterial Line BP: (100-177)/(35-83) 177/83 mmHg (12/07 0900) FiO2 (%):  [35 %-100 %] 35 % (12/07 0900) Weight:  [104.2 kg (229 lb 11.5 oz)] 104.2 kg (229 lb 11.5 oz) (12/07 0342) Weight change:  Last BM Date:  (unknown)  Intake/Output from previous day: 12/06 0701 - 12/07 0700 In: 623.4 [I.V.:423.4; IV Piggyback:200] Out: 450 [Urine:450]  PHYSICAL EXAM General appearance: no distress and Intubated, sedated and on the ventilator Resp: rhonchi bilaterally Cardio: His heart is regular but he has more of a tachycardia now GI: soft, non-tender; bowel sounds normal; no masses,  no organomegaly Extremities: He has chronic venous stasis and 2+ edema bilaterally  Lab Results:    Basic Metabolic Panel:  Recent Labs  16/10/96 2230 02/14/13 0446  NA 139 140  K 4.1 3.2*  CL 94* 97  CO2 30 30  GLUCOSE 281* 165*  BUN 20 21  CREATININE 1.58* 1.77*  CALCIUM 8.7 8.5   Liver Function Tests: No results found for this basename: AST, ALT, ALKPHOS, BILITOT, PROT, ALBUMIN,  in the last 72 hours No results found for this basename: LIPASE, AMYLASE,  in the last 72 hours No results found for this basename: AMMONIA,  in the last 72 hours CBC:  Recent Labs  02/09/2013 2230 02/14/13 0446  WBC 10.7* 9.7  HGB 13.7 11.9*  HCT 42.9 37.0*  MCV 97.3 96.9  PLT 275 176   Cardiac Enzymes: No results found for this basename: CKTOTAL, CKMB, CKMBINDEX, TROPONINI,  in the last 72 hours BNP:  Recent Labs  03/01/2013 2230  PROBNP 2136.0*   D-Dimer: No results  found for this basename: DDIMER,  in the last 72 hours CBG: No results found for this basename: GLUCAP,  in the last 72 hours Hemoglobin A1C: No results found for this basename: HGBA1C,  in the last 72 hours Fasting Lipid Panel:  Recent Labs  02/14/13 0446  TRIG 214*   Thyroid Function Tests: No results found for this basename: TSH, T4TOTAL, FREET4, T3FREE, THYROIDAB,  in the last 72 hours Anemia Panel: No results found for this basename: VITAMINB12, FOLATE, FERRITIN, TIBC, IRON, RETICCTPCT,  in the last 72 hours Coagulation:  Recent Labs  02/25/2013 2230  LABPROT 12.8  INR 0.98   Urine Drug Screen: Drugs of Abuse     Component Value Date/Time   LABOPIA NONE DETECTED 03/01/2012 0019   COCAINSCRNUR POSITIVE* 03/01/2012 0019   LABBENZ NONE DETECTED 03/01/2012 0019   AMPHETMU NONE DETECTED 03/01/2012 0019   THCU NONE DETECTED 03/01/2012 0019   LABBARB NONE DETECTED 03/01/2012 0019    Alcohol Level: No results found for this basename: ETH,  in the last 72 hours Urinalysis: No results found for this basename: COLORURINE, APPERANCEUR, LABSPEC, PHURINE, GLUCOSEU, HGBUR, BILIRUBINUR, KETONESUR, PROTEINUR, UROBILINOGEN, NITRITE, LEUKOCYTESUR,  in the last 72 hours Misc. Labs:  ABGS  Recent Labs  02/14/13 0500  PHART 7.441  PO2ART 81.3  TCO2 26.7  HCO3 29.9*   CULTURES Recent Results (from the past 240 hour(s))  MRSA PCR SCREENING     Status: None   Collection Time  02/14/13  1:33 AM      Result Value Range Status   MRSA by PCR NEGATIVE  NEGATIVE Final   Comment:            The GeneXpert MRSA Assay (FDA     approved for NASAL specimens     only), is one component of a     comprehensive MRSA colonization     surveillance program. It is not     intended to diagnose MRSA     infection nor to guide or     monitor treatment for     MRSA infections.   Studies/Results: Dg Wrist 2 Views Left  02/14/2013   CLINICAL DATA:  Broken arterial line.  Possible foreign  body.  EXAM: LEFT WRIST - 2 VIEW  COMPARISON:  None.  FINDINGS: There is no visible foreign body in the soft tissues of the distal forearm or in the wrist. No significant osseous abnormality.  IMPRESSION: No foreign body is visible.   Electronically Signed   By: Geanie Cooley M.D.   On: 02/14/2013 09:01   Dg Chest Port 1 View  02/14/2013   CLINICAL DATA:  Orogastric tube placement.  EXAM: PORTABLE CHEST - 1 VIEW  COMPARISON:  Chest radiograph performed 03/01/13  FINDINGS: The patient's endotracheal tube is seen ending 5-6 cm above the carina. An enteric tube is noted extending below the diaphragm.  Mild right perihilar and left basilar airspace opacities may reflect mild pneumonia or possibly interstitial edema. No pleural effusion or pneumothorax is seen.  The cardiomediastinal silhouette is borderline normal in size. No acute osseous abnormalities are identified.  IMPRESSION: 1. Endotracheal tube seen ending 5-6 cm above the carina. 2. Mild right perihilar and left basilar airspace opacities may reflect mild pneumonia or possibly interstitial edema.   Electronically Signed   By: Roanna Raider M.D.   On: 02/14/2013 04:55   Dg Chest Portable 1 View  2013-03-01   CLINICAL DATA:  Endotracheal tube placement.  Shortness of breath.  EXAM: PORTABLE CHEST - 1 VIEW  COMPARISON:  Chest radiograph performed 01/16/2013  FINDINGS: The patient's endotracheal tube is seen ending 5-6 cm above the carina.  The lungs are well aerated. Mild vascular congestion is noted. No focal consolidation is identified. No pleural effusion or pneumothorax is seen. Mild chronically increased interstitial markings are noted.  The cardiomediastinal silhouette is borderline normal in size. No acute osseous abnormalities are identified.  IMPRESSION: 1. Endotracheal tube seen ending 5-6 cm above the carina. 2. Mild vascular congestion noted; mild chronically increased interstitial markings seen.   Electronically Signed   By: Roanna Raider  M.D.   On: 01-Mar-2013 22:53   Dg Chest Port 1v Same Day  02/14/2013   CLINICAL DATA:  Respiratory failure.  EXAM: PORTABLE CHEST - 1 VIEW SAME DAY  COMPARISON:  February 14, 2013.  At 4:20 a.m.  FINDINGS: The lungs are well-expanded. There are coarse left infrahilar lung markings which appear unchanged. The perihilar lung markings on the right do not appear abnormally increased.The endotracheal tube tip lies 6.5 cm above the crotch of the carina. The cardiopericardial silhouette is normal in size. There is tortuosity of the ascending and descending portions of the thoracic aorta. The esophagogastric tube tip projects off the film.  IMPRESSION: 1. The lungs remain hyperinflated. Left lower lobe atelectasis or early pneumonia is present. 2. There is no evidence of CHF. 3. The position of the endotracheal tube tip is approximately 6.5 cm above the carina.  Electronically Signed   By: David  [place]   On: 02/14/2013 09:38    Medications:  Prior to Admission:  Prescriptions prior to admission  Medication Sig Dispense Refill  . acetaminophen-codeine (TYLENOL #3) 300-30 MG per tablet Take 1 tablet by mouth every 4 (four) hours as needed for pain.  60 tablet  5  . albuterol (PROVENTIL HFA;VENTOLIN HFA) 108 (90 BASE) MCG/ACT inhaler Inhale 2 puffs into the lungs as needed for wheezing (rescue inhaler).       Marland Kitchen albuterol (PROVENTIL) (2.5 MG/3ML) 0.083% nebulizer solution Take 2.5 mg by nebulization every 6 (six) hours as needed for wheezing or shortness of breath.      . cloNIDine (CATAPRES) 0.2 MG tablet Take 0.2 mg by mouth 3 (three) times daily.      Marland Kitchen docusate sodium (COLACE) 100 MG capsule Take 100 mg by mouth 2 (two) times daily as needed for mild constipation.      . fluticasone (CUTIVATE) 0.05 % cream Apply 1 application topically daily as needed (rash).       . furosemide (LASIX) 40 MG tablet Take 1 tablet (40 mg total) by mouth 2 (two) times daily.  30 tablet  5  . guaiFENesin (MUCINEX) 600 MG 12 hr  tablet Take 1,200 mg by mouth at bedtime as needed for to loosen phlegm.       Marland Kitchen HYDROcodone-acetaminophen (NORCO/VICODIN) 5-325 MG per tablet Take 1 tablet by mouth every 6 (six) hours as needed for pain.  120 tablet  0  . hydrocortisone (ANUSOL-HC) 25 MG suppository Place 25 mg rectally daily as needed for hemorrhoids.      Marland Kitchen levofloxacin (LEVAQUIN) 500 MG tablet Take 1 tablet (500 mg total) by mouth daily.  7 tablet  0  . lisinopril (PRINIVIL,ZESTRIL) 40 MG tablet Take 40 mg by mouth daily.       . methotrexate (RHEUMATREX) 2.5 MG tablet Take 10 mg by mouth once a week. Saturday      . mometasone-formoterol (DULERA) 200-5 MCG/ACT AERO Inhale 2 puffs into the lungs 2 (two) times daily.  1 Inhaler  12  . pantoprazole (PROTONIX) 40 MG tablet Take 1 tablet (40 mg total) by mouth 2 (two) times daily.  60 tablet  12  . predniSONE (DELTASONE) 10 MG tablet Take 10 mg by mouth 2 (two) times daily.      . predniSONE (DELTASONE) 10 MG tablet Take 4 daily for 3 days then 3 daily for 3 days then 2 dialy  100 tablet  1  . ranitidine (ZANTAC) 300 MG tablet Take 300 mg by mouth at bedtime.       Scheduled: . antiseptic oral rinse  15 mL Mouth Rinse QID  . chlorhexidine  15 mL Mouth/Throat BID  . furosemide  40 mg Intravenous Q12H  . heparin  5,000 Units Subcutaneous Q8H  . ipratropium      . ipratropium  0.5 mg Nebulization Q4H  . levalbuterol      . levalbuterol  0.63 mg Nebulization Q4H  . methylPREDNISolone (SOLU-MEDROL) injection  60 mg Intravenous Q6H  . pantoprazole (PROTONIX) IV  40 mg Intravenous QHS  . piperacillin-tazobactam (ZOSYN)  IV  3.375 g Intravenous Q8H  . potassium chloride  10 mEq Intravenous Q1 Hr x 4  . vancomycin  1,500 mg Intravenous Q24H   Continuous: . sodium chloride 50 mL/hr at 02/14/13 0800  . propofol 40 mcg/kg/min (02/14/13 0858)   XWR:UEAVWU chloride, fentaNYL  Assesment: He has acute respiratory failure. He has  pneumonia. He has some congestive heart failure as  well. It's not quite clear what happened. He had an appointment scheduled with me on 12/ 07/2012  that he did not come to. I don't know if he was allready sick at that point or not. Principal Problem:   Respiratory failure, acute Active Problems:   COPD exacerbation   Bilateral lower extremity edema   Obstructive sleep apnea   Fever   Acute respiratory failure    Plan: Continue antibiotics. Continue his other treatments. He will have some Lasix because of the edema he is not ready for extubation    LOS: 1 day   [name] L 02/14/2013, 10:29 AM

## 2013-02-14 NOTE — Progress Notes (Addendum)
.   ANTIBIOTIC CONSULT NOTE-Preliminary  Pharmacy Consult for vancomycin and Zosyn Indication: pneumonia  Allergies  Allergen Reactions  . Bee Venom Anaphylaxis    Patient has an epi pen    Patient Measurements: Weight 104.2 kg Height approx 72 inches  Vital Signs: Temp: 99 F (37.2 C) (12/07 0100) BP: 94/49 mmHg (12/06 2305) Pulse Rate: 111 (12/07 0100)  Labs:  Recent Labs  02/23/2013 2230  WBC 10.7*  HGB 13.7  PLT 275  CREATININE 1.58*    Medical History: Past Medical History  Diagnosis Date  . Hypertension   . Dyspnea   . Chronic kidney disease, stage 2, mildly decreased GFR     Creatinine of 1.31 in 04/2011  . COPD (chronic obstructive pulmonary disease)     moderate by spirometry in 08/2009;FEV1 of 1.1 ;nl alpha -1 antitrypsin   . Obesity   . Psoriasis   . Left eye trauma     with loss of vision  . Erectile dysfunction   . Lumbosacral spinal stenosis     chronic low back pain  . Headache(784.0)   . Sleep apnea     O2 at night  . Carotid artery aneurysm   . Asthma   . Psoriasis   . CHF (congestive heart failure)   . Renal insufficiency    Estimated CrCl ~ 55 ml/min  Medications:  Vancomycin 1000 mg IV x 1 on 12/7 at 0051 Zosyn 3.375 g IV x 1 on 12/7 at 0009  Assessment: Pt is a 57 yo M presenting with acute respiratory failure requiring intubation, purulent secretions, and a fever. Vancomycin and Zosyn being initiated for possible HCAP. Pt received one dose of both vancomycin and Zosyn. Due to pt large size, will need additional dose of vancomycin for adequate coverage.   Goal of Therapy:  Vancomycin trough level 15-20 mcg/ml  Plan:  Vancomycin 1000 mg IV x 1 to equal total loading dose of 2000 mg  Preliminary review of pertinent patient information completed.  Protocol will be initiated with a one-time dose(s) of vancomycin.  Jeani Hawking clinical pharmacist will complete review during morning rounds to assess patient and finalize treatment  regimen.  Christpher Stogsdill [place], RPH 02/14/2013,2:15 AM

## 2013-02-14 NOTE — Progress Notes (Signed)
ANTIBIOTIC CONSULT NOTE- follow up  Pharmacy Consult for vancomycin and Zosyn Indication: pneumonia  Allergies  Allergen Reactions  . Bee Venom Anaphylaxis    Patient has an epi pen   Patient Measurements: Weight 104.2 kg Height approx 72 inches  Vital Signs: Temp: 97.8 F (36.6 C) (12/07 0430) Temp src: Axillary (12/07 0430) BP: 90/48 mmHg (12/07 0645) Pulse Rate: 99 (12/07 0645)  Labs:  Recent Labs  02-18-2013 2230 02/14/13 0446  WBC 10.7* 9.7  HGB 13.7 11.9*  PLT 275 176  CREATININE 1.58* 1.77*   Medical History: Past Medical History  Diagnosis Date  . Hypertension   . Dyspnea   . Chronic kidney disease, stage 2, mildly decreased GFR     Creatinine of 1.31 in 04/2011  . COPD (chronic obstructive pulmonary disease)     moderate by spirometry in 08/2009;FEV1 of 1.1 ;nl alpha -1 antitrypsin   . Obesity   . Psoriasis   . Left eye trauma     with loss of vision  . Erectile dysfunction   . Lumbosacral spinal stenosis     chronic low back pain  . Headache(784.0)   . Sleep apnea     O2 at night  . Carotid artery aneurysm   . Asthma   . Psoriasis   . CHF (congestive heart failure)   . Renal insufficiency    Estimated Normalized CrCl ~ 45-50 ml/min  Assessment: Pt is a 57 yo M presenting with acute respiratory failure requiring intubation, purulent secretions, and a fever. Vancomycin and Zosyn being initiated for possible HCAP. Pt received one dose of both vancomycin and Zosyn. Due to pt large size, will need additional dose of vancomycin for adequate coverage. Pt has elevated SCr.  Goal of Therapy:  Vancomycin trough level 15-20 mcg/ml  Plan:  Vancomycin 2000mg  IV loading dose then Vancomycin 1500mg  IV q24hrs Check trough at steady state Zosyn 3.375gm IV q8h Monitor labs, renal fxn, and cultures  Wayland Denis, RPH 02/14/2013,8:05 AM

## 2013-02-14 NOTE — ED Notes (Signed)
Urinary output now very clear- entire output in urimeter 90ml plus 10 ml sent for analysis.  Emptied hourly chamber into collection overflow container.

## 2013-02-14 NOTE — H&P (Signed)
[facility] History and Physical  [name] ZOX:[id number] DOB: [birth year] DOA: 02/27/2013   PCP: Fredirick Maudlin, MD  Specialists: Unknown  Chief Complaint: Shortness of breath  HPI: [name] is a 57 y.o. male with a past medical history of COPD, and obstructive sleep apnea, who was in his usual state of health till the last few days when he started developing shortness of breath. Patient was in respiratory distress when he was brought into the hospital. He was subsequently intubated and placed on mechanical ventilation. Patient is currently intubated and sedated and unable to provide any history. He was noted to have a temperature of 101F. According to the ED physician he was noted to have purulent secretions when he was intubated. Patient was also quite tachycardic and hypertensive when he was initially assessed and those parameters have improved. So history is limited at this time.  Home Medications: Prior to Admission medications   Medication Sig Start Date End Date Taking? Authorizing Provider  acetaminophen-codeine (TYLENOL #3) 300-30 MG per tablet Take 1 tablet by mouth every 4 (four) hours as needed for pain. 01/06/13   Vickki Hearing, MD  albuterol (PROVENTIL HFA;VENTOLIN HFA) 108 (90 BASE) MCG/ACT inhaler Inhale 2 puffs into the lungs as needed for wheezing (rescue inhaler).     Historical Provider, MD  albuterol (PROVENTIL) (2.5 MG/3ML) 0.083% nebulizer solution Take 2.5 mg by nebulization every 6 (six) hours as needed for wheezing or shortness of breath.    Historical Provider, MD  cloNIDine (CATAPRES) 0.2 MG tablet Take 0.2 mg by mouth 3 (three) times daily.    Historical Provider, MD  docusate sodium (COLACE) 100 MG capsule Take 100 mg by mouth 2 (two) times daily as needed for mild constipation.    Historical Provider, MD  fluticasone (CUTIVATE) 0.05 % cream Apply 1 application topically daily as needed (rash).  09/21/12   Historical Provider, MD   furosemide (LASIX) 40 MG tablet Take 1 tablet (40 mg total) by mouth 2 (two) times daily. 12/01/12   Fredirick Maudlin, MD  guaiFENesin (MUCINEX) 600 MG 12 hr tablet Take 1,200 mg by mouth at bedtime as needed for to loosen phlegm.     Historical Provider, MD  HYDROcodone-acetaminophen (NORCO/VICODIN) 5-325 MG per tablet Take 1 tablet by mouth every 6 (six) hours as needed for pain. 12/07/12   Vickki Hearing, MD  hydrocortisone (ANUSOL-HC) 25 MG suppository Place 25 mg rectally daily as needed for hemorrhoids. 01/05/13   Fredirick Maudlin, MD  levofloxacin (LEVAQUIN) 500 MG tablet Take 1 tablet (500 mg total) by mouth daily. 01/19/13   Fredirick Maudlin, MD  lisinopril (PRINIVIL,ZESTRIL) 40 MG tablet Take 40 mg by mouth daily.  04/06/12   Historical Provider, MD  methotrexate (RHEUMATREX) 2.5 MG tablet Take 10 mg by mouth once a week. Saturday 11/20/12   Historical Provider, MD  mometasone-formoterol (DULERA) 200-5 MCG/ACT AERO Inhale 2 puffs into the lungs 2 (two) times daily. 01/05/13   Fredirick Maudlin, MD  pantoprazole (PROTONIX) 40 MG tablet Take 1 tablet (40 mg total) by mouth 2 (two) times daily. 01/05/13   Fredirick Maudlin, MD  predniSONE (DELTASONE) 10 MG tablet Take 10 mg by mouth 2 (two) times daily. 01/05/13   Fredirick Maudlin, MD  predniSONE (DELTASONE) 10 MG tablet Take 4 daily for 3 days then 3 daily for 3 days then 2 dialy 01/19/13   Fredirick Maudlin, MD  ranitidine (ZANTAC) 300 MG tablet Take 300 mg by  mouth at bedtime.    Historical Provider, MD    Allergies:  Allergies  Allergen Reactions  . Bee Venom Anaphylaxis    Patient has an epi pen    Past Medical History: Past Medical History  Diagnosis Date  . Hypertension   . Dyspnea   . Chronic kidney disease, stage 2, mildly decreased GFR     Creatinine of 1.31 in 04/2011  . COPD (chronic obstructive pulmonary disease)     moderate by spirometry in 08/2009;FEV1 of 1.1 ;nl alpha -1 antitrypsin   . Obesity   . Psoriasis   .  Left eye trauma     with loss of vision  . Erectile dysfunction   . Lumbosacral spinal stenosis     chronic low back pain  . Headache(784.0)   . Sleep apnea     O2 at night  . Carotid artery aneurysm   . Asthma   . Psoriasis   . CHF (congestive heart failure)   . Renal insufficiency     Past Surgical History  Procedure Laterality Date  . Orif patella  12/14/2005    left fracture Dr.Harrison   . Ophthalmologic surgery  1963    secondary to left eye trauma, as a child hit in eye with tree limb   . Colonoscopy N/A 05/06/2012    RMR: internal hemorrhoids, tubular adenoma, cecal ulcerations with benign path, repeat in 2019  . Esophagogastroduodenoscopy  05/06/12    RMR: probably cervical esophageal web s/p 87 F dilation, hiatal hernia, antral and bulbar erosions with negative H.pylori    Social History: Unable to obtain due to sedation  Family History:  Family History  Problem Relation Age of Onset  . Heart disease    . Arthritis    . Lung disease    . Cancer    . Asthma    . Colon cancer Brother     deceased at 38   . Brain cancer Brother      Review of Systems - unable to obtain due to sedation  Physical Examination  Filed Vitals:   02/10/2013 2300 02/20/2013 2305 02/12/2013 2358 02/14/13 0004  BP: 89/43 94/49    Pulse: 153 153 117 113  Temp: 101.4 F (38.6 C) 101.6 F (38.7 C) 100.6 F (38.1 C) 100.3 F (37.9 C)  Resp:   14 14  SpO2: 98% 97% 100% 99%    General appearance: intubated and sedated. In no distress. Head: Normocephalic, without obvious abnormality, atraumatic Neck: no adenopathy, no carotid bruit, no JVD, supple, symmetrical, trachea midline and thyroid not enlarged, symmetric, no tenderness/mass/nodules Resp: Diffuse end expiratory wheezing bilaterally, with a few crackles at the bases. Cardio: S1-S2 is tachycardic. Regular. No S3, S4. No rubs, murmurs, bruit. He does have pitting pedal edema in bilateral lower extremities GI: soft, non-tender; bowel  sounds normal; no masses,  no organomegaly Extremities: edema 1+ pitting bilateral lower extremities Pulses: 2+ and symmetric Skin: Skin color, texture, turgor normal. No rashes or lesions Lymph nodes: Cervical, supraclavicular, and axillary nodes normal. Neurologic: Intubated and sedated. Withdraws to painful stimuli.  Laboratory Data: Results for orders placed during the hospital encounter of 03/03/2013 (from the past 48 hour(s))  CBC     Status: Abnormal   Collection Time    02/08/2013 10:30 PM      Result Value Range   WBC 10.7 (*) 4.0 - 10.5 K/uL   RBC 4.41  4.22 - 5.81 MIL/uL   Hemoglobin 13.7  13.0 - 17.0 g/dL  HCT 42.9  39.0 - 52.0 %   MCV 97.3  78.0 - 100.0 fL   MCH 31.1  26.0 - 34.0 pg   MCHC 31.9  30.0 - 36.0 g/dL   RDW 40.9  81.1 - 91.4 %   Platelets 275  150 - 400 K/uL  PROTIME-INR     Status: None   Collection Time    02/22/2013 10:30 PM      Result Value Range   Prothrombin Time 12.8  11.6 - 15.2 seconds   INR 0.98  0.00 - 1.49  APTT     Status: None   Collection Time    02/21/2013 10:30 PM      Result Value Range   aPTT 24  24 - 37 seconds  PRO B NATRIURETIC PEPTIDE     Status: Abnormal   Collection Time    03/10/2013 10:30 PM      Result Value Range   Pro B Natriuretic peptide (BNP) 2136.0 (*) 0 - 125 pg/mL  BASIC METABOLIC PANEL     Status: Abnormal   Collection Time    02/10/2013 10:30 PM      Result Value Range   Sodium 139  135 - 145 mEq/L   Potassium 4.1  3.5 - 5.1 mEq/L   Chloride 94 (*) 96 - 112 mEq/L   CO2 30  19 - 32 mEq/L   Glucose, Bld 281 (*) 70 - 99 mg/dL   BUN 20  6 - 23 mg/dL   Creatinine, Ser 7.82 (*) 0.50 - 1.35 mg/dL   Calcium 8.7  8.4 - 95.6 mg/dL   GFR calc non Af Amer 47 (*) >90 mL/min   GFR calc Af Amer 54 (*) >90 mL/min   Comment: (NOTE)     The eGFR has been calculated using the CKD EPI equation.     This calculation has not been validated in all clinical situations.     eGFR's persistently <90 mL/min signify possible Chronic Kidney      Disease.  LACTIC ACID, PLASMA     Status: Abnormal   Collection Time    02/12/2013 10:54 PM      Result Value Range   Lactic Acid, Venous 2.9 (*) 0.5 - 2.2 mmol/L  BLOOD GAS, ARTERIAL     Status: Abnormal   Collection Time    02/14/13 12:15 AM      Result Value Range   FIO2 100.00     Delivery systems VENTILATOR     Mode PRESSURE REGULATED VOLUME CONTROL     VT 650     Rate 14     Peep/cpap 5.0     pH, Arterial 7.385  7.350 - 7.450   pCO2 arterial 51.6 (*) 35.0 - 45.0 mmHg   pO2, Arterial 430.0 (*) 80.0 - 100.0 mmHg   Bicarbonate 30.2 (*) 20.0 - 24.0 mEq/L   TCO2 27.7  0 - 100 mmol/L   Acid-Base Excess 5.4 (*) 0.0 - 2.0 mmol/L   O2 Saturation 100.0     Collection site ARTERIAL LINE     Drawn by [id number]     Sample type ARTERIAL     Allens test (pass/fail) PASS  PASS    Radiology Reports: Dg Chest Portable 1 View  02/09/2013   CLINICAL DATA:  Endotracheal tube placement.  Shortness of breath.  EXAM: PORTABLE CHEST - 1 VIEW  COMPARISON:  Chest radiograph performed 01/16/2013  FINDINGS: The patient's endotracheal tube is seen ending 5-6 cm above the carina.  The lungs are well aerated. Mild vascular congestion is noted. No focal consolidation is identified. No pleural effusion or pneumothorax is seen. Mild chronically increased interstitial markings are noted.  The cardiomediastinal silhouette is borderline normal in size. No acute osseous abnormalities are identified.  IMPRESSION: 1. Endotracheal tube seen ending 5-6 cm above the carina. 2. Mild vascular congestion noted; mild chronically increased interstitial markings seen.   Electronically Signed   By: Roanna Raider M.D.   On: 03/01/2013 22:53    Electrocardiogram: Sinus tachycardia 169 beats per minute. Normal axis. Intervals are normal. ST depression noted in lateral leads, which is probably related to tachycardia.  Problem List  Principal Problem:   Respiratory failure, acute Active Problems:   COPD exacerbation    Bilateral lower extremity edema   Obstructive sleep apnea   Fever   Acute respiratory failure   Assessment: This is a 57 year old, African American male, who presents with respiratory distress. He has acute respiratory failure, possibly due to COPD exacerbation. However, he is febrile, and while he was being intubated purulent secretion was noted in his airway. He could have pneumonia. Since he was admitted recently this may be considered Healthcare associated infection. Influenza is another possibility.   Plan:  #1 acute respiratory failure with COPD exacerbation and possible healthcare associated pneumonia: He'll be treated with vancomycin and Zosyn for now. Influenza panel will be checked. He'll be given steroids, nebulizer treatments. Mechanical ventilation will be continued. Sedation will be provided. ABG will be repeated in the morning. He does have mildly elevated lactic acid level. He'll be given IV fluids.  #2 abnormal EKG: Most likely due to tachycardia. EKG will be repeated.  #3 fever: As noted under #1. Treat with antibiotics for presumed healthcare associated pneumonia. Influenza panel will be followed up on.  #4 arterial stick to the left radial artery: Arterial line was attempted at that site by the ED physician, which was not very successful. ED physician will order a x-ray of the left wrist area to make sure there is no foreign object as the catheter apparently broke while arterial line was being attempted. Arterial line was successfully placed in the right radial artery.  #5 history of chronic diastolic heart failure with lower extremity edema: He has been given a dose of Lasix. Continue to monitor his volume status closely. He is on Lasix at home. Further doses can be determined tomorrow.  #6 history of chronic kidney disease, stage II. His creatinine is higher than his baseline. Continue to monitor urine output. Repeat labs in the morning.  DVT Prophylaxis: Heparin Code  Status: Full code Family Communication: No family is available at this time  Disposition Plan: Admit to ICU. Dr. Juanetta Gosling to assume care in the morning.   Further management decisions will depend on results of further testing and patient's response to treatment.  [facility]  [facility] Pager [phone]  If 7PM-7AM, please contact night-coverage [email or web address] Password [facility]  02/14/2013, 12:37 AM

## 2013-02-14 NOTE — ED Notes (Signed)
Dr. Hyacinth Meeker inserting Arterial line - initial attempt left radial unsuccessful..   Inserted right radial artery

## 2013-02-15 ENCOUNTER — Inpatient Hospital Stay (HOSPITAL_COMMUNITY): Payer: Medicare HMO

## 2013-02-15 LAB — CBC WITH DIFFERENTIAL/PLATELET
Basophils Absolute: 0 10*3/uL (ref 0.0–0.1)
Eosinophils Relative: 0 % (ref 0–5)
HCT: 36.6 % — ABNORMAL LOW (ref 39.0–52.0)
Hemoglobin: 11.9 g/dL — ABNORMAL LOW (ref 13.0–17.0)
Lymphocytes Relative: 4 % — ABNORMAL LOW (ref 12–46)
MCH: 31 pg (ref 26.0–34.0)
Monocytes Absolute: 0.3 10*3/uL (ref 0.1–1.0)
Neutro Abs: 10.1 10*3/uL — ABNORMAL HIGH (ref 1.7–7.7)
Neutrophils Relative %: 93 % — ABNORMAL HIGH (ref 43–77)
Platelets: 199 10*3/uL (ref 150–400)
RDW: 15 % (ref 11.5–15.5)
WBC: 10.9 10*3/uL — ABNORMAL HIGH (ref 4.0–10.5)

## 2013-02-15 LAB — BASIC METABOLIC PANEL
CO2: 29 mEq/L (ref 19–32)
Chloride: 98 mEq/L (ref 96–112)
Creatinine, Ser: 1.87 mg/dL — ABNORMAL HIGH (ref 0.50–1.35)
GFR calc Af Amer: 44 mL/min — ABNORMAL LOW (ref >90)
Potassium: 3.3 mEq/L — ABNORMAL LOW (ref 3.5–5.1)
Sodium: 142 mEq/L (ref 135–145)

## 2013-02-15 LAB — GLUCOSE, CAPILLARY: Glucose-Capillary: 137 mg/dL — ABNORMAL HIGH (ref 70–99)

## 2013-02-15 LAB — BLOOD GAS, ARTERIAL
Bicarbonate: 28.7 mEq/L — ABNORMAL HIGH (ref 20.0–24.0)
FIO2: 0.35 %
O2 Saturation: 95.1 %
TCO2: 26.2 mmol/L (ref 0–100)
pCO2 arterial: 47.2 mmHg — ABNORMAL HIGH (ref 35.0–45.0)
pH, Arterial: 7.401 (ref 7.350–7.450)

## 2013-02-15 MED ORDER — VITAL AF 1.2 CAL PO LIQD
1000.0000 mL | ORAL | Status: AC
Start: 2013-02-15 — End: 2013-02-16
  Administered 2013-02-15: 1000 mL
  Filled 2013-02-15 (×2): qty 1000

## 2013-02-15 MED ORDER — INSULIN ASPART 100 UNIT/ML ~~LOC~~ SOLN
0.0000 [IU] | Freq: Three times a day (TID) | SUBCUTANEOUS | Status: DC
  Administered 2013-02-15 – 2013-02-17 (×5): 3 [IU] via SUBCUTANEOUS
  Administered 2013-02-17: 4 [IU] via SUBCUTANEOUS
  Administered 2013-02-17 – 2013-02-18 (×2): 3 [IU] via SUBCUTANEOUS
  Administered 2013-02-18: 4 [IU] via SUBCUTANEOUS
  Administered 2013-02-18: 3 [IU] via SUBCUTANEOUS
  Administered 2013-02-19: 4 [IU] via SUBCUTANEOUS

## 2013-02-15 MED ORDER — POTASSIUM CHLORIDE 10 MEQ/100ML IV SOLN
10.0000 meq | INTRAVENOUS | Status: AC
  Administered 2013-02-15 (×4): 10 meq via INTRAVENOUS
  Filled 2013-02-15 (×4): qty 100

## 2013-02-15 MED ORDER — VITAL HIGH PROTEIN PO LIQD: 1000.0000 mL | ORAL | Status: DC

## 2013-02-15 MED ORDER — VITAL HIGH PROTEIN PO LIQD
1000.0000 mL | ORAL | Status: DC
  Administered 2013-02-16 (×5)
  Administered 2013-02-16: 1000 mL
  Administered 2013-02-17: 12:00:00
  Filled 2013-02-15 (×3): qty 1000

## 2013-02-15 NOTE — Progress Notes (Signed)
RT placed pt on 5/5 SBT and 35%. Pt looked good and within 15 minutes he became agitated, increased HR 160, RR 39, and BP 203/109. Nurse called RT to inform her of the PT's condition.

## 2013-02-15 NOTE — Clinical Documentation Improvement (Signed)
THIS DOCUMENT IS NOT A PERMANENT PART OF THE MEDICAL RECORD  Please update your documentation within the medical record to reflect your response to this query. If you need help knowing how to do this please call [phone].  02/15/13  Dear Dr. Juanetta Gosling Marton Redwood  A review of the medical record has revealed the following indicators.  Based on your clinical judgment, please clarify and document in a progress note and/or discharge summary the clinical condition associated with the following supporting information:  Abnormal findings (laboratory, x-ray, pathologic, and other diagnostic results) are not coded and reported unless the physician indicates their clinical significance.   The medical record reflects the following clinical findings, please clarify the diagnostic and/or clinical significance:      Possible Clinical Conditions?                                 :  Hypokalemia :  Other Condition___________________                Cannot Clinically Determine_________  Clinical findings  Normal K+ levels = 3.5-5.1  12/7  = 3.2 12/8  = 3.3  Treatment: 12/7  K+ IV q1h x 4 12/8  K+ IV q1h x 4  Reviewed: Response found in 12/10 progress note-hypokalemia  Thank You,  Harless Litten  Clinical Documentation Specialist: [phone] Health Information Management [place]

## 2013-02-15 NOTE — Progress Notes (Signed)
INITIAL NUTRITION ASSESSMENT  DOCUMENTATION CODES Per approved criteria  -Obesity Unspecified   INTERVENTION: Patient has OG tube in place. Vital AF 1.2  is infusing @ 40 ml/hr.  Tube feeding regimen currently providing 1152 kcal, 72 grams protein, and 778 ml H2O.  When current bag is complete change EN formula to Vital HP @ 40 ml/hr to improve protein intake. Will provide 960 kcal, 84 gr protein and 802 ml water daily.  Propofol is adding 826 kcal daily at current rate.  Free water flushes: 100 ml every 4 hrs providing 600 ml  of free water.  Total free water: 1402 ml per day.  Residuals: 5 ml  Last bm: PTA   NUTRITION DIAGNOSIS: Inadequate oral intake related to inability to eat as evidenced by NPO status.  Goal: Enteral nutrition to provide 60-70% of estimated calorie needs (22-25 kcals/kg ideal body weight) and 100% of estimated protein needs, based on ASPEN guidelines for permissive underfeeding in critically ill obese individuals.  Monitor:  Respiratory status, nutrition support tolerance, labs, I/O's and wt changes  Reason for Assessment: Enteral nutrition management consult  57 y.o. male  Admitting Dx: Respiratory failure, acute  ASSESSMENT: Patient is currently intubated on ventilator support.  MV: 8.8 L/min Temp (24hrs), Avg:97.9 F (36.6 C), Min:97.1 F (36.2 C), Max:98.2 F (36.8 C)  Propofol: 31.3 ml/hr provides 826 lipid kcal daily   Height: Ht Readings from Last 1 Encounters:  02/15/13 6' (1.829 m)    Weight: Wt Readings from Last 1 Encounters:  02/15/13 238 lb 15.7 oz (108.4 kg)    Ideal Body Weight: 178# (80.9 kg)  % Ideal Body Weight: 134%  Wt Readings from Last 10 Encounters:  02/15/13 238 lb 15.7 oz (108.4 kg)  01/19/13 230 lb 4.8 oz (104.463 kg)  01/03/13 220 lb 14.4 oz (100.2 kg)  12/29/12 225 lb (102.059 kg)  12/16/12 224 lb 12 oz (101.946 kg)  12/01/12 227 lb 4.7 oz (103.1 kg)  10/07/12 230 lb (104.327 kg)  07/23/12 240 lb  1.3 oz (108.9 kg)  04/30/12 232 lb 12.8 oz (105.597 kg)  03/12/12 229 lb 15 oz (104.3 kg)    Usual Body Weight: 224-238#  % Usual Body Weight: 100%  BMI:  Body mass index is 32.4 kg/(m^2).obesity class I  Estimated Nutritional Needs: Kcal: 2037 (60-70% energy 1222-1425) Protein: 121-137 gr Fluid: >2200 ml daily  Skin:  intact  Diet Order: NPO  EDUCATION NEEDS: -No education needs identified at this time   Intake/Output Summary (Last 24 hours) at 02/15/13 1532 Last data filed at 02/15/13 1400  Gross per 24 hour  Intake 3182.4 ml  Output    950 ml  Net 2232.4 ml    Labs:   Recent Labs Lab 02/10/2013 2230 02/14/13 0446 02/15/13 0433  NA 139 140 142  K 4.1 3.2* 3.3*  CL 94* 97 98  CO2 30 30 29   BUN 20 21 26*  CREATININE 1.58* 1.77* 1.87*  CALCIUM 8.7 8.5 9.2  GLUCOSE 281* 165* 169*    CBG (last 3)   Recent Labs  02/15/13 1100  GLUCAP 137*    Scheduled Meds: . antiseptic oral rinse  15 mL Mouth Rinse QID  . chlorhexidine  15 mL Mouth/Throat BID  . furosemide  40 mg Intravenous Q12H  . heparin  5,000 Units Subcutaneous Q8H  . insulin aspart  0-20 Units Subcutaneous TID WC  . ipratropium  0.5 mg Nebulization Q4H  . levalbuterol  0.63 mg Nebulization Q4H  . methylPREDNISolone (  SOLU-MEDROL) injection  60 mg Intravenous Q6H  . pantoprazole (PROTONIX) IV  40 mg Intravenous QHS  . piperacillin-tazobactam (ZOSYN)  IV  3.375 g Intravenous Q8H  . vancomycin  1,500 mg Intravenous Q24H    Continuous Infusions: . sodium chloride 50 mL/hr at 02/15/13 1400  . feeding supplement (VITAL AF 1.2 CAL) 1,000 mL (02/15/13 1400)  . propofol 50 mcg/kg/min (02/15/13 1513)    Past Medical History  Diagnosis Date  . Hypertension   . Dyspnea   . Chronic kidney disease, stage 2, mildly decreased GFR     Creatinine of 1.31 in 04/2011  . COPD (chronic obstructive pulmonary disease)     moderate by spirometry in 08/2009;FEV1 of 1.1 ;nl alpha -1 antitrypsin   . Obesity    . Psoriasis   . Left eye trauma     with loss of vision  . Erectile dysfunction   . Lumbosacral spinal stenosis     chronic low back pain  . Headache(784.0)   . Sleep apnea     O2 at night  . Carotid artery aneurysm   . Asthma   . Psoriasis   . CHF (congestive heart failure)   . Renal insufficiency     Past Surgical History  Procedure Laterality Date  . Orif patella  12/14/2005    left fracture Dr.Harrison   . Ophthalmologic surgery  1963    secondary to left eye trauma, as a child hit in eye with tree limb   . Colonoscopy N/A 05/06/2012    RMR: internal hemorrhoids, tubular adenoma, cecal ulcerations with benign path, repeat in 2019  . Esophagogastroduodenoscopy  05/06/12    RMR: probably cervical esophageal web s/p 77 F dilation, hiatal hernia, antral and bulbar erosions with negative H.pylori    Royann Shivers MS,RD,CSG,LDN Office: [phone] Pager: [phone]

## 2013-02-15 NOTE — Progress Notes (Signed)
Inpatient Diabetes Program Recommendations  AACE/ADA: New Consensus Statement on Inpatient Glycemic Control (2013)  Target Ranges:  Prepandial:   less than 140 mg/dL      Peak postprandial:   less than 180 mg/dL (1-2 hours)      Critically ill patients:  140 - 180 mg/dL   Results for OBDULIO, MASH (MRN [id number]) as of 02/15/2013 08:13  Ref. Range 02/12/2013 22:30 02/14/2013 04:46 02/15/2013 04:33  Glucose Latest Range: 70-99 mg/dL 409 (H) 811 (H) 914 (H)    Inpatient Diabetes Program Recommendations Correction (SSI): Please consider ordering CBGs with Novolog correction Q4H while on steroids.  Note: Patient does not have a documented history of diabetes.  However, initial lab glucose was noted to be 281 mg/dl.  Fasting glucose this morning was 169 mg/dl.  Patient is ordered steroids which is likely cause of hyperglycemia.  While inpatient and receiving steroids, please consider ordering CBGs with Novolog correction Q4H.  Will continue to follow.  Thanks, Orlando Penner, RN, MSN, CCRN Diabetes Coordinator Inpatient Diabetes Program [phone] (Team Pager) [phone] (AP office) [phone] [facility] office)

## 2013-02-15 NOTE — Care Management Note (Addendum)
    Page 1 of 2   02/25/2013     10:57:21 AM   CARE MANAGEMENT NOTE 02/25/2013  Patient:  [name], [name]   Account Number:  [id number]  Date Initiated:  02/15/2013  Documentation initiated by:  Sharrie Rothman  Subjective/Objective Assessment:   Pt admitted from home with respiratory failure. Pt lives alone but has a friend that stays with him at times. Pt is active with [facility] and [facility] RN. Pt also has a CAP aide that is with pt 3 hours a day.     Action/Plan:   Pt currently on ventilator and unable to communicate. Will continue to follow for discharge planning needs. Will arrange resumption of HH if able to return home at discharge.   Anticipated DC Date:  02/22/2013   Anticipated DC Plan:  HOME W HOME HEALTH SERVICES      DC Planning Services  CM consult      Choice offered to / List presented to:          [facility] arranged  HH-1 RN  HH-2 PT      [facility] agency  [facility]   Status of service:  Completed, signed off Medicare Important Message given?   (If response is "NO", the following Medicare IM given date fields will be blank) Date Medicare IM given:   Date Additional Medicare IM given:    Discharge Disposition:  HOME W HOME HEALTH SERVICES  Per UR Regulation:    If discussed at Long Length of Stay Meetings, dates discussed:   02/18/2013  02/23/2013  02/25/2013    Comments:  02/25/13 1055 Arlyss Queen, RN BSN CM Pt transferring to [facility].  02/24/13 1430 Arlyss Queen, RN BSN CM Per insurance, pt will need to be on vent for 21 days and trached before LTAC could be considered.  02/23/13 1405 Arlyss Queen, RN BSN CM [facility] checking pts insurance for any LTAC benefits.  02/18/13 1545 Arlyss Queen, RN BSN CM Pt still on vent. Did not tolerate weaning today.  02/15/13 1522 Arlyss Queen, RN BSN CM

## 2013-02-15 NOTE — Progress Notes (Signed)
UR chart review completed.  

## 2013-02-15 NOTE — Progress Notes (Signed)
Subjective: He was doing well this morning and weaning was underway when he developed much worse shortness of breath. Weaning was aborted and he was recent dated and placed back on full ventilator support.  Objective: Vital signs in last 24 hours: Temp:  [97.1 F (36.2 C)-98.6 F (37 C)] 98 F (36.7 C) (12/08 0800) Pulse Rate:  [98-139] 129 (12/08 0815) Resp:  [14-32] 15 (12/08 0815) BP: (91-157)/(45-76) 116/61 mmHg (12/08 0815) SpO2:  [89 %-100 %] 93 % (12/08 0815) Arterial Line BP: (139-203)/(59-89) 164/74 mmHg (12/08 0815) FiO2 (%):  [35 %] 35 % (12/08 0745) Weight:  [107 kg (235 lb 14.3 oz)-108.4 kg (238 lb 15.7 oz)] 108.4 kg (238 lb 15.7 oz) (12/08 0630) Weight change: 2.8 kg (6 lb 2.8 oz) Last BM Date:  (unknown)  Intake/Output from previous day: 12/07 0701 - 12/08 0700 In: 3063.9 [I.V.:2063.9; IV Piggyback:1000] Out: 1300 [Urine:1000; Emesis/NG output:300]  PHYSICAL EXAM General appearance: alert and severe distress Resp: rhonchi bilaterally Cardio: regular rate and rhythm, S1, S2 normal, no murmur, click, rub or gallop GI: soft, non-tender; bowel sounds normal; no masses,  no organomegaly Extremities: extremities normal, atraumatic, no cyanosis or edema  Lab Results:    Basic Metabolic Panel:  Recent Labs  98/11/91 0446 02/15/13 0433  NA 140 142  K 3.2* 3.3*  CL 97 98  CO2 30 29  GLUCOSE 165* 169*  BUN 21 26*  CREATININE 1.77* 1.87*  CALCIUM 8.5 9.2   Liver Function Tests: No results found for this basename: AST, ALT, ALKPHOS, BILITOT, PROT, ALBUMIN,  in the last 72 hours No results found for this basename: LIPASE, AMYLASE,  in the last 72 hours No results found for this basename: AMMONIA,  in the last 72 hours CBC:  Recent Labs  02/14/13 0446 02/15/13 0433  WBC 9.7 10.9*  NEUTROABS  --  10.1*  HGB 11.9* 11.9*  HCT 37.0* 36.6*  MCV 96.9 95.3  PLT 176 199   Cardiac Enzymes: No results found for this basename: CKTOTAL, CKMB, CKMBINDEX,  TROPONINI,  in the last 72 hours BNP:  Recent Labs  03/07/2013 2230  PROBNP 2136.0*   D-Dimer: No results found for this basename: DDIMER,  in the last 72 hours CBG: No results found for this basename: GLUCAP,  in the last 72 hours Hemoglobin A1C: No results found for this basename: HGBA1C,  in the last 72 hours Fasting Lipid Panel:  Recent Labs  02/14/13 0446  TRIG 214*   Thyroid Function Tests: No results found for this basename: TSH, T4TOTAL, FREET4, T3FREE, THYROIDAB,  in the last 72 hours Anemia Panel: No results found for this basename: VITAMINB12, FOLATE, FERRITIN, TIBC, IRON, RETICCTPCT,  in the last 72 hours Coagulation:  Recent Labs  03/08/2013 2230  LABPROT 12.8  INR 0.98   Urine Drug Screen: Drugs of Abuse     Component Value Date/Time   LABOPIA NONE DETECTED 03/01/2012 0019   COCAINSCRNUR POSITIVE* 03/01/2012 0019   LABBENZ NONE DETECTED 03/01/2012 0019   AMPHETMU NONE DETECTED 03/01/2012 0019   THCU NONE DETECTED 03/01/2012 0019   LABBARB NONE DETECTED 03/01/2012 0019    Alcohol Level: No results found for this basename: ETH,  in the last 72 hours Urinalysis: No results found for this basename: COLORURINE, APPERANCEUR, LABSPEC, PHURINE, GLUCOSEU, HGBUR, BILIRUBINUR, KETONESUR, PROTEINUR, UROBILINOGEN, NITRITE, LEUKOCYTESUR,  in the last 72 hours Misc. Labs:  ABGS  Recent Labs  02/15/13 0500  PHART 7.401  PO2ART 76.9*  TCO2 26.2  HCO3 28.7*  CULTURES Recent Results (from the past 240 hour(s))  MRSA PCR SCREENING     Status: None   Collection Time    02/14/13  1:33 AM      Result Value Range Status   MRSA by PCR NEGATIVE  NEGATIVE Final   Comment:            The GeneXpert MRSA Assay (FDA     approved for NASAL specimens     only), is one component of a     comprehensive MRSA colonization     surveillance program. It is not     intended to diagnose MRSA     infection nor to guide or     monitor treatment for     MRSA infections.    Studies/Results: Dg Wrist 2 Views Left  02/14/2013   CLINICAL DATA:  Broken arterial line.  Possible foreign body.  EXAM: LEFT WRIST - 2 VIEW  COMPARISON:  None.  FINDINGS: There is no visible foreign body in the soft tissues of the distal forearm or in the wrist. No significant osseous abnormality.  IMPRESSION: No foreign body is visible.   Electronically Signed   By: Geanie Cooley M.D.   On: 02/14/2013 09:01   Dg Chest Port 1 View  02/15/2013   CLINICAL DATA:  Hypertension, COPD, CHF  EXAM: PORTABLE CHEST - 1 VIEW  COMPARISON:  Portable exam 0632 hr compared to 02/14/2013  FINDINGS: Tip of endotracheal tube projects 4.5 cm above carinal.  Nasogastric tube extends to the mid thorax is but is not seen beyond due to underpenetration of the inferior mediastinum.  Enlargement of cardiac silhouette.  Mediastinal contours and pulmonary vascularity normal.  Minimal bibasilar atelectasis greater on right.  Lungs otherwise clear.  No pleural effusion or pneumothorax.  IMPRESSION: Minimal bibasilar atelectasis.  Enlargement of cardiac silhouette.   Electronically Signed   By: Ulyses Southward M.D.   On: 02/15/2013 07:33   Dg Chest Port 1 View  02/14/2013   CLINICAL DATA:  Orogastric tube placement.  EXAM: PORTABLE CHEST - 1 VIEW  COMPARISON:  Chest radiograph performed 02/25/2013  FINDINGS: The patient's endotracheal tube is seen ending 5-6 cm above the carina. An enteric tube is noted extending below the diaphragm.  Mild right perihilar and left basilar airspace opacities may reflect mild pneumonia or possibly interstitial edema. No pleural effusion or pneumothorax is seen.  The cardiomediastinal silhouette is borderline normal in size. No acute osseous abnormalities are identified.  IMPRESSION: 1. Endotracheal tube seen ending 5-6 cm above the carina. 2. Mild right perihilar and left basilar airspace opacities may reflect mild pneumonia or possibly interstitial edema.   Electronically Signed   By: Roanna Raider M.D.    On: 02/14/2013 04:55   Dg Chest Portable 1 View  02/12/2013   CLINICAL DATA:  Endotracheal tube placement.  Shortness of breath.  EXAM: PORTABLE CHEST - 1 VIEW  COMPARISON:  Chest radiograph performed 01/16/2013  FINDINGS: The patient's endotracheal tube is seen ending 5-6 cm above the carina.  The lungs are well aerated. Mild vascular congestion is noted. No focal consolidation is identified. No pleural effusion or pneumothorax is seen. Mild chronically increased interstitial markings are noted.  The cardiomediastinal silhouette is borderline normal in size. No acute osseous abnormalities are identified.  IMPRESSION: 1. Endotracheal tube seen ending 5-6 cm above the carina. 2. Mild vascular congestion noted; mild chronically increased interstitial markings seen.   Electronically Signed   By: Beryle Beams.D.  On: Mar 15, 2013 22:53   Dg Chest Port 1v Same Day  02/14/2013   CLINICAL DATA:  Respiratory failure.  EXAM: PORTABLE CHEST - 1 VIEW SAME DAY  COMPARISON:  February 14, 2013.  At 4:20 a.m.  FINDINGS: The lungs are well-expanded. There are coarse left infrahilar lung markings which appear unchanged. The perihilar lung markings on the right do not appear abnormally increased.The endotracheal tube tip lies 6.5 cm above the crotch of the carina. The cardiopericardial silhouette is normal in size. There is tortuosity of the ascending and descending portions of the thoracic aorta. The esophagogastric tube tip projects off the film.  IMPRESSION: 1. The lungs remain hyperinflated. Left lower lobe atelectasis or early pneumonia is present. 2. There is no evidence of CHF. 3. The position of the endotracheal tube tip is approximately 6.5 cm above the carina.   Electronically Signed   By: David  [place]   On: 02/14/2013 09:38    Medications:  Prior to Admission:  Prescriptions prior to admission  Medication Sig Dispense Refill  . acetaminophen-codeine (TYLENOL #3) 300-30 MG per tablet Take 1 tablet by mouth  every 4 (four) hours as needed for pain.  60 tablet  5  . albuterol (PROVENTIL HFA;VENTOLIN HFA) 108 (90 BASE) MCG/ACT inhaler Inhale 2 puffs into the lungs as needed for wheezing (rescue inhaler).       Marland Kitchen albuterol (PROVENTIL) (2.5 MG/3ML) 0.083% nebulizer solution Take 2.5 mg by nebulization every 6 (six) hours as needed for wheezing or shortness of breath.      . cloNIDine (CATAPRES) 0.2 MG tablet Take 0.2 mg by mouth 3 (three) times daily.      Marland Kitchen docusate sodium (COLACE) 100 MG capsule Take 100 mg by mouth 2 (two) times daily as needed for mild constipation.      . fluticasone (CUTIVATE) 0.05 % cream Apply 1 application topically daily as needed (rash).       . furosemide (LASIX) 40 MG tablet Take 1 tablet (40 mg total) by mouth 2 (two) times daily.  30 tablet  5  . guaiFENesin (MUCINEX) 600 MG 12 hr tablet Take 1,200 mg by mouth at bedtime as needed for to loosen phlegm.       Marland Kitchen HYDROcodone-acetaminophen (NORCO/VICODIN) 5-325 MG per tablet Take 1 tablet by mouth every 6 (six) hours as needed for pain.  120 tablet  0  . hydrocortisone (ANUSOL-HC) 25 MG suppository Place 25 mg rectally daily as needed for hemorrhoids.      Marland Kitchen levofloxacin (LEVAQUIN) 500 MG tablet Take 1 tablet (500 mg total) by mouth daily.  7 tablet  0  . lisinopril (PRINIVIL,ZESTRIL) 40 MG tablet Take 40 mg by mouth daily.       . methotrexate (RHEUMATREX) 2.5 MG tablet Take 10 mg by mouth once a week. Saturday      . mometasone-formoterol (DULERA) 200-5 MCG/ACT AERO Inhale 2 puffs into the lungs 2 (two) times daily.  1 Inhaler  12  . pantoprazole (PROTONIX) 40 MG tablet Take 1 tablet (40 mg total) by mouth 2 (two) times daily.  60 tablet  12  . predniSONE (DELTASONE) 10 MG tablet Take 10 mg by mouth 2 (two) times daily.      . predniSONE (DELTASONE) 10 MG tablet Take 4 daily for 3 days then 3 daily for 3 days then 2 dialy  100 tablet  1  . ranitidine (ZANTAC) 300 MG tablet Take 300 mg by mouth at bedtime.       Scheduled: .  antiseptic oral rinse  15 mL Mouth Rinse QID  . chlorhexidine  15 mL Mouth/Throat BID  . furosemide  40 mg Intravenous Q12H  . heparin  5,000 Units Subcutaneous Q8H  . ipratropium  0.5 mg Nebulization Q4H  . levalbuterol  0.63 mg Nebulization Q4H  . methylPREDNISolone (SOLU-MEDROL) injection  60 mg Intravenous Q6H  . pantoprazole (PROTONIX) IV  40 mg Intravenous QHS  . piperacillin-tazobactam (ZOSYN)  IV  3.375 g Intravenous Q8H  . potassium chloride  10 mEq Intravenous Q1 Hr x 4  . vancomycin  1,500 mg Intravenous Q24H   Continuous: . sodium chloride 50 mL/hr at 02/15/13 0800  . propofol 50 mcg/kg/min (02/15/13 0805)   QMV:HQIONG chloride, fentaNYL  Assesment: He was admitted with acute respiratory failure. This is combined with COPD sleep apnea and chronic congestive heart failure. He has diabetes with elevated blood sugars here. He is on IV steroids. He failed weaning today. Principal Problem:   Respiratory failure, acute Active Problems:   COPD exacerbation   Bilateral lower extremity edema   Obstructive sleep apnea   Fever   Acute respiratory failure    Plan: Continue current treatments. He will start sliding scale. He needs to start nutritional support.    LOS: 2 days   [name] L 02/15/2013, 8:24 AM

## 2013-02-15 NOTE — Progress Notes (Signed)
Respiratory therapy note- changed VT to 8cc/kg, per order.

## 2013-02-16 ENCOUNTER — Inpatient Hospital Stay (HOSPITAL_COMMUNITY): Payer: Medicare HMO

## 2013-02-16 DIAGNOSIS — E876 Hypokalemia: Secondary | ICD-10-CM | POA: Diagnosis not present

## 2013-02-16 DIAGNOSIS — I5033 Acute on chronic diastolic (congestive) heart failure: Secondary | ICD-10-CM | POA: Diagnosis present

## 2013-02-16 LAB — BASIC METABOLIC PANEL
CO2: 31 mEq/L (ref 19–32)
Calcium: 8.5 mg/dL (ref 8.4–10.5)
GFR calc non Af Amer: 47 mL/min — ABNORMAL LOW (ref >90)
Glucose, Bld: 185 mg/dL — ABNORMAL HIGH (ref 70–99)
Potassium: 3.3 mEq/L — ABNORMAL LOW (ref 3.5–5.1)
Sodium: 142 mEq/L (ref 135–145)

## 2013-02-16 LAB — CULTURE, RESPIRATORY W GRAM STAIN

## 2013-02-16 LAB — BLOOD GAS, ARTERIAL
Acid-Base Excess: 1.9 mmol/L (ref 0.0–2.0)
Drawn by: 38235
FIO2: 0.3 %
MECHVT: 620 mL
O2 Saturation: 95 %
PEEP: 5 cmH2O
Patient temperature: 37
RATE: 14 resp/min
pCO2 arterial: 51.3 mmHg — ABNORMAL HIGH (ref 35.0–45.0)
pO2, Arterial: 82.7 mmHg (ref 80.0–100.0)

## 2013-02-16 LAB — CBC WITH DIFFERENTIAL/PLATELET
Eosinophils Absolute: 0 10*3/uL (ref 0.0–0.7)
Eosinophils Relative: 0 % (ref 0–5)
Hemoglobin: 11.6 g/dL — ABNORMAL LOW (ref 13.0–17.0)
Lymphocytes Relative: 2 % — ABNORMAL LOW (ref 12–46)
Lymphs Abs: 0.3 10*3/uL — ABNORMAL LOW (ref 0.7–4.0)
MCH: 30.9 pg (ref 26.0–34.0)
MCV: 96.8 fL (ref 78.0–100.0)
Monocytes Absolute: 0.3 10*3/uL (ref 0.1–1.0)
Monocytes Relative: 3 % (ref 3–12)
Neutrophils Relative %: 95 % — ABNORMAL HIGH (ref 43–77)
Platelets: 188 10*3/uL (ref 150–400)
RBC: 3.75 MIL/uL — ABNORMAL LOW (ref 4.22–5.81)
WBC: 12 10*3/uL — ABNORMAL HIGH (ref 4.0–10.5)

## 2013-02-16 LAB — URINALYSIS, DIPSTICK ONLY
Bilirubin Urine: NEGATIVE
Glucose, UA: NEGATIVE mg/dL
Ketones, ur: NEGATIVE mg/dL
Protein, ur: 30 mg/dL — AB
Specific Gravity, Urine: >1.03 — ABNORMAL HIGH (ref 1.005–1.030)
Urobilinogen, UA: 0.2 mg/dL (ref 0.0–1.0)

## 2013-02-16 LAB — GLUCOSE, CAPILLARY
Glucose-Capillary: 187 mg/dL — ABNORMAL HIGH (ref 70–99)
Glucose-Capillary: 145 mg/dL — ABNORMAL HIGH (ref 70–99)

## 2013-02-16 MED ORDER — ENALAPRILAT 1.25 MG/ML IV SOLN
1.2500 mg | Freq: Four times a day (QID) | INTRAVENOUS | Status: DC
  Administered 2013-02-16 – 2013-02-17 (×5): 1.25 mg via INTRAVENOUS
  Filled 2013-02-16 (×5): qty 2

## 2013-02-16 MED ORDER — FUROSEMIDE 10 MG/ML IJ SOLN
40.0000 mg | Freq: Once | INTRAMUSCULAR | Status: AC
  Administered 2013-02-16: 40 mg via INTRAVENOUS
  Filled 2013-02-16: qty 4

## 2013-02-16 MED ORDER — POTASSIUM CHLORIDE 10 MEQ/100ML IV SOLN
10.0000 meq | INTRAVENOUS | Status: AC
  Administered 2013-02-16 (×4): 10 meq via INTRAVENOUS
  Filled 2013-02-16 (×4): qty 100

## 2013-02-16 MED ORDER — LORAZEPAM 2 MG/ML IJ SOLN
1.0000 mg | INTRAMUSCULAR | Status: DC | PRN
  Administered 2013-02-18 – 2013-02-24 (×3): 1 mg via INTRAVENOUS
  Filled 2013-02-16 (×3): qty 1

## 2013-02-16 MED ORDER — HYDRALAZINE HCL 20 MG/ML IJ SOLN
25.0000 mg | Freq: Four times a day (QID) | INTRAMUSCULAR | Status: DC | PRN
  Administered 2013-02-16 – 2013-02-21 (×3): 25 mg via INTRAVENOUS
  Filled 2013-02-16 (×3): qty 2

## 2013-02-16 MED ORDER — MIDAZOLAM HCL 2 MG/2ML IJ SOLN
2.0000 mg | Freq: Once | INTRAMUSCULAR | Status: AC
  Administered 2013-02-16: 2 mg via INTRAVENOUS
  Filled 2013-02-16: qty 2

## 2013-02-16 NOTE — Progress Notes (Signed)
Pt looks uncomfortable, HR and BP still very elevated even after prn Hydralzine order from Dr Juanetta Gosling at 1600. IV prn Fentanyl given along with Diprovan bolus. Pt repositioned and suctioned. No secretions from ET tube. No changes noted. Pt lung sounds noted to be diminished and tight sounding with expiratory wheezes.   RT called to bedside to assess pt. Stat 12 lead and chest xray done.   Elink contact for further evaluation. New orders received. Will continue to monitor.

## 2013-02-16 NOTE — Progress Notes (Signed)
Subjective: He remains intubated and on the ventilator. His heart rate and blood pressure went up when he was attempted on weaning. He is back on full ventilator support.  Objective: Vital signs in last 24 hours: Temp:  [97.4 F (36.3 C)-98.2 F (36.8 C)] 97.5 F (36.4 C) (12/09 0400) Pulse Rate:  [103-119] 112 (12/09 0700) Resp:  [13-28] 15 (12/09 0830) BP: (93-165)/(50-80) 149/75 mmHg (12/09 0830) SpO2:  [93 %-100 %] 98 % (12/09 0737) Arterial Line BP: (135-208)/(62-93) 208/93 mmHg (12/09 0830) FiO2 (%):  [30 %-35 %] 30 % (12/09 0755) Weight:  [108.7 kg (239 lb 10.2 oz)] 108.7 kg (239 lb 10.2 oz) (12/09 0500) Weight change: 1.7 kg (3 lb 12 oz) Last BM Date:  (unknown)  Intake/Output from previous day: 12/08 0701 - 12/09 0700 In: 3364.2 [I.V.:1786.2; NG/GT:478; IV Piggyback:1100] Out: 2500 [Urine:2500]  PHYSICAL EXAM General appearance: moderate distress and He was awake during the wake up assessment of became very agitated. He is now sedated and back on full ventilator support Resp: rhonchi bilaterally Cardio: regular rate and rhythm, S1, S2 normal, no murmur, click, rub or gallop GI: soft, non-tender; bowel sounds normal; no masses,  no organomegaly Extremities: venous stasis dermatitis noted  Lab Results:    Basic Metabolic Panel:  Recent Labs  19/14/78 0433 02/16/13 0418  NA 142 142  K 3.3* 3.3*  CL 98 101  CO2 29 31  GLUCOSE 169* 185*  BUN 26* 33*  CREATININE 1.87* 1.58*  CALCIUM 9.2 8.5   Liver Function Tests: No results found for this basename: AST, ALT, ALKPHOS, BILITOT, PROT, ALBUMIN,  in the last 72 hours No results found for this basename: LIPASE, AMYLASE,  in the last 72 hours No results found for this basename: AMMONIA,  in the last 72 hours CBC:  Recent Labs  02/15/13 0433 02/16/13 0418  WBC 10.9* 12.0*  NEUTROABS 10.1* 11.4*  HGB 11.9* 11.6*  HCT 36.6* 36.3*  MCV 95.3 96.8  PLT 199 188   Cardiac Enzymes: No results found for this  basename: CKTOTAL, CKMB, CKMBINDEX, TROPONINI,  in the last 72 hours BNP:  Recent Labs  02/21/2013 2230  PROBNP 2136.0*   D-Dimer: No results found for this basename: DDIMER,  in the last 72 hours CBG:  Recent Labs  02/15/13 1100 02/15/13 1645 02/15/13 1956 02/16/13 0009 02/16/13 0413 02/16/13 0747  GLUCAP 137* 140* 157* 171* 187* 145*   Hemoglobin A1C: No results found for this basename: HGBA1C,  in the last 72 hours Fasting Lipid Panel:  Recent Labs  02/14/13 0446  TRIG 214*   Thyroid Function Tests: No results found for this basename: TSH, T4TOTAL, FREET4, T3FREE, THYROIDAB,  in the last 72 hours Anemia Panel: No results found for this basename: VITAMINB12, FOLATE, FERRITIN, TIBC, IRON, RETICCTPCT,  in the last 72 hours Coagulation:  Recent Labs  02/23/2013 2230  LABPROT 12.8  INR 0.98   Urine Drug Screen: Drugs of Abuse     Component Value Date/Time   LABOPIA NONE DETECTED 03/01/2012 0019   COCAINSCRNUR POSITIVE* 03/01/2012 0019   LABBENZ NONE DETECTED 03/01/2012 0019   AMPHETMU NONE DETECTED 03/01/2012 0019   THCU NONE DETECTED 03/01/2012 0019   LABBARB NONE DETECTED 03/01/2012 0019    Alcohol Level: No results found for this basename: ETH,  in the last 72 hours Urinalysis: No results found for this basename: COLORURINE, APPERANCEUR, LABSPEC, PHURINE, GLUCOSEU, HGBUR, BILIRUBINUR, KETONESUR, PROTEINUR, UROBILINOGEN, NITRITE, LEUKOCYTESUR,  in the last 72 hours Misc. Labs:  ABGS  Recent Labs  02/16/13 0500  PHART 7.341*  PO2ART 82.7  TCO2 24.6  HCO3 27.0*   CULTURES Recent Results (from the past 240 hour(s))  MRSA PCR SCREENING     Status: None   Collection Time    02/14/13  1:33 AM      Result Value Range Status   MRSA by PCR NEGATIVE  NEGATIVE Final   Comment:            The GeneXpert MRSA Assay (FDA     approved for NASAL specimens     only), is one component of a     comprehensive MRSA colonization     surveillance program. It is  not     intended to diagnose MRSA     infection nor to guide or     monitor treatment for     MRSA infections.  CULTURE, RESPIRATORY (NON-EXPECTORATED)     Status: None   Collection Time    02/14/13  2:00 PM      Result Value Range Status   Specimen Description ASPIRATE   Final   Special Requests NONE   Final   Gram Stain     Final   Value: ABUNDANT WBC PRESENT, PREDOMINANTLY PMN     FEW SQUAMOUS EPITHELIAL CELLS PRESENT     FEW GRAM POSITIVE COCCI IN PAIRS     RARE GRAM NEGATIVE RODS     Performed at [facility]   Culture     Final   Value: Culture reincubated for better growth     Performed at [facility]   Report Status PENDING   Incomplete   Studies/Results: Dg Chest Port 1 View  02/15/2013   CLINICAL DATA:  Hypertension, COPD, CHF  EXAM: PORTABLE CHEST - 1 VIEW  COMPARISON:  Portable exam 0632 hr compared to 02/14/2013  FINDINGS: Tip of endotracheal tube projects 4.5 cm above carinal.  Nasogastric tube extends to the mid thorax is but is not seen beyond due to underpenetration of the inferior mediastinum.  Enlargement of cardiac silhouette.  Mediastinal contours and pulmonary vascularity normal.  Minimal bibasilar atelectasis greater on right.  Lungs otherwise clear.  No pleural effusion or pneumothorax.  IMPRESSION: Minimal bibasilar atelectasis.  Enlargement of cardiac silhouette.   Electronically Signed   By: Ulyses Southward M.D.   On: 02/15/2013 07:33   Dg Chest Port 1v Same Day  02/14/2013   CLINICAL DATA:  Respiratory failure.  EXAM: PORTABLE CHEST - 1 VIEW SAME DAY  COMPARISON:  February 14, 2013.  At 4:20 a.m.  FINDINGS: The lungs are well-expanded. There are coarse left infrahilar lung markings which appear unchanged. The perihilar lung markings on the right do not appear abnormally increased.The endotracheal tube tip lies 6.5 cm above the crotch of the carina. The cardiopericardial silhouette is normal in size. There is tortuosity of the ascending and descending  portions of the thoracic aorta. The esophagogastric tube tip projects off the film.  IMPRESSION: 1. The lungs remain hyperinflated. Left lower lobe atelectasis or early pneumonia is present. 2. There is no evidence of CHF. 3. The position of the endotracheal tube tip is approximately 6.5 cm above the carina.   Electronically Signed   By: David  [place]   On: 02/14/2013 09:38    Medications:  Prior to Admission:  Prescriptions prior to admission  Medication Sig Dispense Refill  . acetaminophen-codeine (TYLENOL #3) 300-30 MG per tablet Take 1 tablet by mouth every 4 (four) hours as needed  for pain.  60 tablet  5  . albuterol (PROVENTIL HFA;VENTOLIN HFA) 108 (90 BASE) MCG/ACT inhaler Inhale 2 puffs into the lungs as needed for wheezing (rescue inhaler).       Marland Kitchen albuterol (PROVENTIL) (2.5 MG/3ML) 0.083% nebulizer solution Take 2.5 mg by nebulization every 6 (six) hours as needed for wheezing or shortness of breath.      . cloNIDine (CATAPRES) 0.2 MG tablet Take 0.2 mg by mouth 3 (three) times daily.      Marland Kitchen docusate sodium (COLACE) 100 MG capsule Take 100 mg by mouth 2 (two) times daily as needed for mild constipation.      . fluticasone (CUTIVATE) 0.05 % cream Apply 1 application topically daily as needed (rash).       . furosemide (LASIX) 40 MG tablet Take 1 tablet (40 mg total) by mouth 2 (two) times daily.  30 tablet  5  . guaiFENesin (MUCINEX) 600 MG 12 hr tablet Take 1,200 mg by mouth at bedtime as needed for to loosen phlegm.       Marland Kitchen HYDROcodone-acetaminophen (NORCO/VICODIN) 5-325 MG per tablet Take 1 tablet by mouth every 6 (six) hours as needed for pain.  120 tablet  0  . hydrocortisone (ANUSOL-HC) 25 MG suppository Place 25 mg rectally daily as needed for hemorrhoids.      Marland Kitchen levofloxacin (LEVAQUIN) 500 MG tablet Take 1 tablet (500 mg total) by mouth daily.  7 tablet  0  . lisinopril (PRINIVIL,ZESTRIL) 40 MG tablet Take 40 mg by mouth daily.       . methotrexate (RHEUMATREX) 2.5 MG tablet Take  10 mg by mouth once a week. Saturday      . mometasone-formoterol (DULERA) 200-5 MCG/ACT AERO Inhale 2 puffs into the lungs 2 (two) times daily.  1 Inhaler  12  . pantoprazole (PROTONIX) 40 MG tablet Take 1 tablet (40 mg total) by mouth 2 (two) times daily.  60 tablet  12  . predniSONE (DELTASONE) 10 MG tablet Take 10 mg by mouth 2 (two) times daily.      . predniSONE (DELTASONE) 10 MG tablet Take 4 daily for 3 days then 3 daily for 3 days then 2 dialy  100 tablet  1  . ranitidine (ZANTAC) 300 MG tablet Take 300 mg by mouth at bedtime.       Scheduled: . antiseptic oral rinse  15 mL Mouth Rinse QID  . chlorhexidine  15 mL Mouth/Throat BID  . enalaprilat  1.25 mg Intravenous Q6H  . feeding supplement (VITAL HIGH PROTEIN)  1,000 mL Per Tube Q24H  . furosemide  40 mg Intravenous Q12H  . heparin  5,000 Units Subcutaneous Q8H  . insulin aspart  0-20 Units Subcutaneous TID WC  . ipratropium  0.5 mg Nebulization Q4H  . levalbuterol  0.63 mg Nebulization Q4H  . methylPREDNISolone (SOLU-MEDROL) injection  60 mg Intravenous Q6H  . pantoprazole (PROTONIX) IV  40 mg Intravenous QHS  . piperacillin-tazobactam (ZOSYN)  IV  3.375 g Intravenous Q8H  . potassium chloride  10 mEq Intravenous Q1 Hr x 4  . vancomycin  1,500 mg Intravenous Q24H   Continuous: . sodium chloride 50 mL/hr at 02/16/13 0800  . feeding supplement (VITAL AF 1.2 CAL) 1,000 mL (02/16/13 0300)  . propofol 35 mcg/kg/min (02/16/13 0837)   YNW:GNFAOZ chloride, fentaNYL, LORazepam  Assesment: He has acute on chronic respiratory failure. This is likely COPD exacerbation. He has some element of congestive heart failure which is being treated with Lasix. His blood  pressure has been up and his heart rate has been up but I think that's mostly related to his agitation. I am going to start him on IV Vasotec. His potassium is still low and that's going to be replaced Principal Problem:   Respiratory failure, acute Active Problems:   COPD  exacerbation   Bilateral lower extremity edema   Obstructive sleep apnea   Fever   Acute respiratory failure    Plan: No change except as above. Continue ventilator support    LOS: 3 days   Shantana Christon L 02/16/2013, 8:42 AM

## 2013-02-16 NOTE — Progress Notes (Signed)
Pt looks more uncomfortable. RT was called by nurse because of his HR 138 and his BP 208/90. RT deflated cuff, suctioned the Pt, did a recruitment maneuver, and adjusted his I- time. RT did an EKG (tachy). Pt seem to like th I time better but his HR and BP is no better

## 2013-02-16 NOTE — Significant Event (Signed)
Pt noted to have elevated blood pressure, and tachycardia.  B/l breath sounds noted by bedside RN.  Has positive fluid balance.  Appears uncomfortable >> no improvement with fentanyl boluses, and already on diprivan.  CXR pending.  Received hydralazine w/o improvement in BP.  Will give lasix 40 mg x one and versed 2 mg x one.  Coralyn Helling, MD 02/16/2013, 6:11 PM

## 2013-02-16 NOTE — Progress Notes (Signed)
Upon assessment this am when OG tube placement assessed, OG tube was found to not be in the correct location. OG tube was advanced and OG tube is now in the correct place. Pt's ET tube and mouth suctioned. The subepiglottic suction is noted to have a small amount of tube feeding in tubing. RT made aware of events. Will continue to monitor.

## 2013-02-16 NOTE — Progress Notes (Signed)
Dr Juanetta Gosling paged and made aware that pts BP has been elevated despite adding Vasotec. Next dose of Vasotec isn't due till 1800. ART line reading 200-212/80's. New orders received. Will continue to monitor.

## 2013-02-16 NOTE — Progress Notes (Signed)
Noticed no urine in foley bag. Attempted to flush catheter but catheter was completely occluded with sediment. Old cath removed. New 62fr foley placed without difficulty. Yellow cloudy urine with sediment returned about 800 cc.

## 2013-02-16 NOTE — Progress Notes (Signed)
Place Pt on cpap 5/5 and he became very agitated and his BP and Hr increased, 132 HR, 230/116 BP. RT increased pressure to 15 and Pt still didn't tolerated. RT placed Pt back on full support.

## 2013-02-16 NOTE — Progress Notes (Addendum)
Nutrition Follow-up  INTERVENTION: Tube feeding held per nursing report will follow-up in a.m.   Recommend consider per Adult Enteral protocol:  If residuals >47ml re-feed entire residual continue tube feeding and re-check in 1 hour.   If residuals remain >484ml re-feed entire residual and decrease tube feeding rate 50% and re-check in 2 hours.   If residual remains >400 ml re-feed entire residual and notify MD for consideration of prokinetic or further instructions.   If residuals < 400 ml re-feed and resume previous rate.  NUTRITION DIAGNOSIS: Inadequate oral intake related to inability to eat as evidenced by NPO status.  Goal: Enteral nutrition to provide 60-70% of estimated calorie needs (22-25 kcals/kg ideal body weight or 1780-2022 kcal/day) and 100% of estimated protein needs, based on ASPEN guidelines for permissive underfeeding in critically ill obese individuals.  Monitor:  Respiratory status, nutrition support tolerance, labs, I/O's and wt changes  Reason for Assessment: Enteral nutrition management consult  57 y.o. male  Admitting Dx: Respiratory failure, acute  ASSESSMENT: Patient is currently intubated on ventilator support. Formula changed to increase protein intake. Vital HP @ 40 ml/hr is delivering 960 kcal, 84 gr protein and 802 ml water daily. MV: 8.7 L/min Temp (24hrs), Avg:97.5 F (36.4 C), Min:97.4 F (36.3 C), Max:97.5 F (36.4 C)  Propofol: 31.3 ml/hr provides 826 lipid kcal daily   Height: Ht Readings from Last 1 Encounters:  02/15/13 6' (1.829 m)    Weight: Wt Readings from Last 1 Encounters:  02/16/13 239 lb 10.2 oz (108.7 kg)    Ideal Body Weight: 178# (80.9 kg)  % Ideal Body Weight: 134%  Wt Readings from Last 10 Encounters:  02/16/13 239 lb 10.2 oz (108.7 kg)  01/19/13 230 lb 4.8 oz (104.463 kg)  01/03/13 220 lb 14.4 oz (100.2 kg)  12/29/12 225 lb (102.059 kg)  12/16/12 224 lb 12 oz (101.946 kg)  12/01/12 227 lb 4.7 oz (103.1  kg)  10/07/12 230 lb (104.327 kg)  07/23/12 240 lb 1.3 oz (108.9 kg)  04/30/12 232 lb 12.8 oz (105.597 kg)  03/12/12 229 lb 15 oz (104.3 kg)    Usual Body Weight: 224-238#  % Usual Body Weight: 100%  BMI:  Body mass index is 32.49 kg/(m^2).obesity class I  Estimated Nutritional Needs: Kcal: 2037  Protein: 121-137 gr Fluid: >2200 ml daily  Skin:  intact  Diet Order: NPO  EDUCATION NEEDS: -No education needs identified at this time   Intake/Output Summary (Last 24 hours) at 02/16/13 1557 Last data filed at 02/16/13 1200  Gross per 24 hour  Intake 3744.21 ml  Output   2150 ml  Net 1594.21 ml    Labs:   Recent Labs Lab 02/14/13 0446 02/15/13 0433 02/16/13 0418  NA 140 142 142  K 3.2* 3.3* 3.3*  CL 97 98 101  CO2 30 29 31   BUN 21 26* 33*  CREATININE 1.77* 1.87* 1.58*  CALCIUM 8.5 9.2 8.5  GLUCOSE 165* 169* 185*    CBG (last 3)   Recent Labs  02/16/13 0413 02/16/13 0747 02/16/13 1133  GLUCAP 187* 145* 114*    Scheduled Meds: . antiseptic oral rinse  15 mL Mouth Rinse QID  . chlorhexidine  15 mL Mouth/Throat BID  . enalaprilat  1.25 mg Intravenous Q6H  . feeding supplement (VITAL HIGH PROTEIN)  1,000 mL Per Tube Q24H  . furosemide  40 mg Intravenous Q12H  . heparin  5,000 Units Subcutaneous Q8H  . insulin aspart  0-20 Units Subcutaneous TID WC  .  ipratropium  0.5 mg Nebulization Q4H  . levalbuterol  0.63 mg Nebulization Q4H  . methylPREDNISolone (SOLU-MEDROL) injection  60 mg Intravenous Q6H  . pantoprazole (PROTONIX) IV  40 mg Intravenous QHS  . piperacillin-tazobactam (ZOSYN)  IV  3.375 g Intravenous Q8H  . vancomycin  1,500 mg Intravenous Q24H    Continuous Infusions: . sodium chloride 50 mL/hr at 02/16/13 1200  . propofol 50 mcg/kg/min (02/16/13 1440)    Past Medical History  Diagnosis Date  . Hypertension   . Dyspnea   . Chronic kidney disease, stage 2, mildly decreased GFR     Creatinine of 1.31 in 04/2011  . COPD (chronic  obstructive pulmonary disease)     moderate by spirometry in 08/2009;FEV1 of 1.1 ;nl alpha -1 antitrypsin   . Obesity   . Psoriasis   . Left eye trauma     with loss of vision  . Erectile dysfunction   . Lumbosacral spinal stenosis     chronic low back pain  . Headache(784.0)   . Sleep apnea     O2 at night  . Carotid artery aneurysm   . Asthma   . Psoriasis   . CHF (congestive heart failure)   . Renal insufficiency     Past Surgical History  Procedure Laterality Date  . Orif patella  12/14/2005    left fracture Dr.Harrison   . Ophthalmologic surgery  1963    secondary to left eye trauma, as a child hit in eye with tree limb   . Colonoscopy N/A 05/06/2012    RMR: internal hemorrhoids, tubular adenoma, cecal ulcerations with benign path, repeat in 2019  . Esophagogastroduodenoscopy  05/06/12    RMR: probably cervical esophageal web s/p 15 F dilation, hiatal hernia, antral and bulbar erosions with negative H.pylori    Royann Shivers MS,RD,CSG,LDN Office: [phone] Pager: [phone]

## 2013-02-17 ENCOUNTER — Encounter (HOSPITAL_COMMUNITY): Payer: Medicare HMO

## 2013-02-17 ENCOUNTER — Inpatient Hospital Stay (HOSPITAL_COMMUNITY): Payer: Medicare HMO

## 2013-02-17 ENCOUNTER — Ambulatory Visit: Payer: Medicare HMO | Admitting: Cardiovascular Disease

## 2013-02-17 LAB — GLUCOSE, CAPILLARY
Glucose-Capillary: 127 mg/dL — ABNORMAL HIGH (ref 70–99)
Glucose-Capillary: 127 mg/dL — ABNORMAL HIGH (ref 70–99)
Glucose-Capillary: 137 mg/dL — ABNORMAL HIGH (ref 70–99)
Glucose-Capillary: 139 mg/dL — ABNORMAL HIGH (ref 70–99)
Glucose-Capillary: 162 mg/dL — ABNORMAL HIGH (ref 70–99)

## 2013-02-17 LAB — BLOOD GAS, ARTERIAL
FIO2: 0.4 %
MECHVT: 620 mL
PEEP: 5 cmH2O
Patient temperature: 37
RATE: 14 resp/min
pH, Arterial: 7.276 — ABNORMAL LOW (ref 7.350–7.450)
pO2, Arterial: 105 mmHg — ABNORMAL HIGH (ref 80.0–100.0)

## 2013-02-17 LAB — CBC WITH DIFFERENTIAL/PLATELET
Basophils Relative: 0 % (ref 0–1)
Eosinophils Absolute: 0 10*3/uL (ref 0.0–0.7)
Eosinophils Relative: 0 % (ref 0–5)
Lymphs Abs: 0.4 10*3/uL — ABNORMAL LOW (ref 0.7–4.0)
MCH: 31.1 pg (ref 26.0–34.0)
MCV: 99 fL (ref 78.0–100.0)
Monocytes Absolute: 0.6 10*3/uL (ref 0.1–1.0)
Monocytes Relative: 4 % (ref 3–12)
Neutro Abs: 11.9 10*3/uL — ABNORMAL HIGH (ref 1.7–7.7)
Neutrophils Relative %: 92 % — ABNORMAL HIGH (ref 43–77)
WBC: 12.9 10*3/uL — ABNORMAL HIGH (ref 4.0–10.5)

## 2013-02-17 LAB — URINE CULTURE
Colony Count: NO GROWTH
Culture: NO GROWTH

## 2013-02-17 LAB — LACTIC ACID, PLASMA: Lactic Acid, Venous: 1.1 mmol/L (ref 0.5–2.2)

## 2013-02-17 LAB — SODIUM, URINE, RANDOM: Sodium, Ur: 17 mEq/L

## 2013-02-17 LAB — CREATININE, URINE, RANDOM: Creatinine, Urine: 60.78 mg/dL

## 2013-02-17 LAB — PROTEIN, URINE, RANDOM: Total Protein, Urine: 73 mg/dL

## 2013-02-17 LAB — BASIC METABOLIC PANEL
CO2: 27 mEq/L (ref 19–32)
Calcium: 8.6 mg/dL (ref 8.4–10.5)
Creatinine, Ser: 2.29 mg/dL — ABNORMAL HIGH (ref 0.50–1.35)
GFR calc Af Amer: 34 mL/min — ABNORMAL LOW (ref >90)
GFR calc non Af Amer: 30 mL/min — ABNORMAL LOW (ref >90)

## 2013-02-17 MED ORDER — SODIUM CHLORIDE 0.9 % IV SOLN
20.0000 ug/h | INTRAVENOUS | Status: DC
  Administered 2013-02-17: 50 ug/h via INTRAVENOUS
  Filled 2013-02-17: qty 50

## 2013-02-17 MED ORDER — METHYLPREDNISOLONE SODIUM SUCC 125 MG IJ SOLR
125.0000 mg | Freq: Four times a day (QID) | INTRAMUSCULAR | Status: DC
  Administered 2013-02-17 – 2013-02-25 (×34): 125 mg via INTRAVENOUS
  Filled 2013-02-17 (×37): qty 2

## 2013-02-17 MED ORDER — SODIUM CHLORIDE 0.9 % IV SOLN
0.0000 ug/h | INTRAVENOUS | Status: DC
  Administered 2013-02-19 – 2013-02-21 (×2): 50 ug/h via INTRAVENOUS
  Filled 2013-02-17 (×4): qty 50

## 2013-02-17 NOTE — Progress Notes (Signed)
ANTIBIOTIC CONSULT NOTE- follow up  Pharmacy Consult for vancomycin and Zosyn Indication: pneumonia  Allergies  Allergen Reactions  . Bee Venom Anaphylaxis    Patient has an epi pen   Patient Measurements: Weight 108.6 kg Height approx 72 inches  Vital Signs: Temp: 98.8 F (37.1 C) (12/10 0730) Temp src: Axillary (12/10 0730) BP: 95/52 mmHg (12/10 0800) Pulse Rate: 111 (12/10 0800)  Labs:  Recent Labs  02/15/13 0433 02/16/13 0418 02/17/13 0425  WBC 10.9* 12.0* 12.9*  HGB 11.9* 11.6* 12.1*  PLT 199 188 213  CREATININE 1.87* 1.58* 2.29*   Medical History: Past Medical History  Diagnosis Date  . Hypertension   . Dyspnea   . Chronic kidney disease, stage 2, mildly decreased GFR     Creatinine of 1.31 in 04/2011  . COPD (chronic obstructive pulmonary disease)     moderate by spirometry in 08/2009;FEV1 of 1.1 ;nl alpha -1 antitrypsin   . Obesity   . Psoriasis   . Left eye trauma     with loss of vision  . Erectile dysfunction   . Lumbosacral spinal stenosis     chronic low back pain  . Headache(784.0)   . Sleep apnea     O2 at night  . Carotid artery aneurysm   . Asthma   . Psoriasis   . CHF (congestive heart failure)   . Renal insufficiency    Estimated Creatinine Clearance: 44.8 ml/min (by C-G formula based on Cr of 2.29).  Assessment: Pt is a 57 yo M presenting with acute respiratory failure requiring intubation, purulent secretions, and a fever. Vancomycin and Zosyn being initiated for possible HCAP. SCr is elevated.  Normalized ClCr = 35-27ml/min.  Zosyn 12/7 >> Vancomycin 12/7 >>  Goal of Therapy:  Vancomycin trough level 15-20 mcg/ml  Plan:  Vancomycin 1500mg  IV q24hrs Check trough tonight before dose Continue Zosyn 3.375gm IV q8h, each dose infused over 4 hrs Monitor labs, renal fxn, and cultures  Margo Aye, Maykel Reitter A, RPH 02/17/2013,10:42 AM

## 2013-02-17 NOTE — Progress Notes (Signed)
Patient's gastric residual at 0800 and at 1200. MD notified and orders given to DC tube feeding. It will be reassessed in the morning and probably restarted.

## 2013-02-17 NOTE — Consult Note (Signed)
CHERRY WITTWER MRN: [id number] DOB/AGE: 06-23-1955 57 y.o. Primary Care Physician:HAWKINS,EDWARD L, MD Admit date: 03/03/2013 Chief Complaint:  Chief Complaint  Patient presents with  . Shortness of Breath   HPI: Pt is 57 year old male with past medical hx of COPD, CKD who came to ER with c/o Dyspnea.  HPI dates back to past few days when pt stared having more than baseline dyspnea. Pt came to ER and was found to be in resp distress and was intubated. Pt wqs started on IV Zosyn and IV vanco. Weaning attempt was made but pt was not able to tolerate it and was on IV RAS blockers. Pt continue to be on Mechanical ventilation. Pt creat was trending upwards and nephrology was consulted.   Past Medical History  Diagnosis Date  . Hypertension   . Dyspnea   . Chronic kidney disease, stage 2, mildly decreased GFR     Creatinine of 1.31 in 04/2011  . COPD (chronic obstructive pulmonary disease)     moderate by spirometry in 08/2009;FEV1 of 1.1 ;nl alpha -1 antitrypsin   . Obesity   . Psoriasis   . Left eye trauma     with loss of vision  . Erectile dysfunction   . Lumbosacral spinal stenosis     chronic low back pain  . Headache(784.0)   . Sleep apnea     O2 at night  . Carotid artery aneurysm   . Asthma   . Psoriasis   . CHF (congestive heart failure)   . Renal insufficiency         Family History  Problem Relation Age of Onset  . Heart disease    . Arthritis    . Lung disease    . Cancer    . Asthma    . Colon cancer Brother     deceased at 58   . Brain cancer Brother     Social History:  reports that he quit smoking about 2 years ago. He has quit using smokeless tobacco. He reports that he drinks about 0.5 ounces of alcohol per week. He reports that he uses illicit drugs (Marijuana).   Allergies:  Allergies  Allergen Reactions  . Bee Venom Anaphylaxis    Patient has an epi pen    Medications Prior to Admission  Medication Sig Dispense Refill  .  acetaminophen-codeine (TYLENOL #3) 300-30 MG per tablet Take 1 tablet by mouth every 4 (four) hours as needed for pain.  60 tablet  5  . albuterol (PROVENTIL HFA;VENTOLIN HFA) 108 (90 BASE) MCG/ACT inhaler Inhale 2 puffs into the lungs as needed for wheezing (rescue inhaler).       Marland Kitchen albuterol (PROVENTIL) (2.5 MG/3ML) 0.083% nebulizer solution Take 2.5 mg by nebulization every 6 (six) hours as needed for wheezing or shortness of breath.      . cloNIDine (CATAPRES) 0.2 MG tablet Take 0.2 mg by mouth 3 (three) times daily.      Marland Kitchen docusate sodium (COLACE) 100 MG capsule Take 100 mg by mouth 2 (two) times daily as needed for mild constipation.      . fluticasone (CUTIVATE) 0.05 % cream Apply 1 application topically daily as needed (rash).       . furosemide (LASIX) 40 MG tablet Take 1 tablet (40 mg total) by mouth 2 (two) times daily.  30 tablet  5  . guaiFENesin (MUCINEX) 600 MG 12 hr tablet Take 1,200 mg by mouth at bedtime as needed for to loosen phlegm.       Marland Kitchen  HYDROcodone-acetaminophen (NORCO/VICODIN) 5-325 MG per tablet Take 1 tablet by mouth every 6 (six) hours as needed for pain.  120 tablet  0  . hydrocortisone (ANUSOL-HC) 25 MG suppository Place 25 mg rectally daily as needed for hemorrhoids.      Marland Kitchen levofloxacin (LEVAQUIN) 500 MG tablet Take 1 tablet (500 mg total) by mouth daily.  7 tablet  0  . lisinopril (PRINIVIL,ZESTRIL) 40 MG tablet Take 40 mg by mouth daily.       . methotrexate (RHEUMATREX) 2.5 MG tablet Take 10 mg by mouth once a week. Saturday      . mometasone-formoterol (DULERA) 200-5 MCG/ACT AERO Inhale 2 puffs into the lungs 2 (two) times daily.  1 Inhaler  12  . pantoprazole (PROTONIX) 40 MG tablet Take 1 tablet (40 mg total) by mouth 2 (two) times daily.  60 tablet  12  . predniSONE (DELTASONE) 10 MG tablet Take 10 mg by mouth 2 (two) times daily.      . predniSONE (DELTASONE) 10 MG tablet Take 4 daily for 3 days then 3 daily for 3 days then 2 dialy  100 tablet  1  . ranitidine  (ZANTAC) 300 MG tablet Take 300 mg by mouth at bedtime.           JYN:WGNFAO to get as pt intubated   . antiseptic oral rinse  15 mL Mouth Rinse QID  . chlorhexidine  15 mL Mouth/Throat BID  . furosemide  40 mg Intravenous Q12H  . heparin  5,000 Units Subcutaneous Q8H  . insulin aspart  0-20 Units Subcutaneous TID WC  . ipratropium  0.5 mg Nebulization Q4H  . levalbuterol  0.63 mg Nebulization Q4H  . methylPREDNISolone (SOLU-MEDROL) injection  125 mg Intravenous Q6H  . pantoprazole (PROTONIX) IV  40 mg Intravenous QHS  . piperacillin-tazobactam (ZOSYN)  IV  3.375 g Intravenous Q8H  . vancomycin  1,500 mg Intravenous Q24H       Physical Exam: Vital signs in last 24 hours: Temp:  [98.8 F (37.1 C)-100.9 F (38.3 C)] 98.8 F (37.1 C) (12/10 0730) Pulse Rate:  [97-151] 103 (12/10 1300) Resp:  [14-28] 18 (12/10 1400) BP: (93-175)/(46-91) 93/46 mmHg (12/10 1400) SpO2:  [88 %-100 %] 100 % (12/10 1300) Arterial Line BP: (105-215)/(42-87) 105/42 mmHg (12/10 1400) FiO2 (%):  [30 %-45 %] 35 % (12/10 1215) Weight:  [239 lb 6.7 oz (108.6 kg)] 239 lb 6.7 oz (108.6 kg) (12/10 0500) Weight change: -3.5 oz (-0.1 kg) Last BM Date: 02/16/13  Intake/Output from previous day: 12/09 0701 - 12/10 0700 In: 4083.7 [I.V.:1935; NG/GT:1198.7; IV Piggyback:950] Out: 1350 [Urine:1350] Total I/O In: 793 [I.V.:543; NG/GT:200; IV Piggyback:50] Out: 850 [Urine:850]   Physical Exam: General- intubated Resp-ET tube in situ, Breath sounds + b/l CVS- S1S2 regular in rate and rhythm GIT- BS+, soft, NT,distended EXT- NO LE Edema, Cyanosis CNS- sedated Psych- unable to get as pt is intubated    Lab Results: CBC  Recent Labs  02/16/13 0418 02/17/13 0425  WBC 12.0* 12.9*  HGB 11.6* 12.1*  HCT 36.3* 38.5*  PLT 188 213    BMET  Recent Labs  02/16/13 0418 02/17/13 0425  NA 142 143  K 3.3* 4.1  CL 101 100  CO2 31 27  GLUCOSE 185* 141*  BUN 33* 57*  CREATININE 1.58* 2.29*   CALCIUM 8.5 8.6   Creat Trend 2014 1.58==>1.77==>1.87==>1.58==>2.29        1.3--1.7 baseline   2013  1.3--1.6  ( Peak 6.20 AKI )  2012  1.2--1.6    MICRO Recent Results (from the past 240 hour(s))  MRSA PCR SCREENING     Status: None   Collection Time    02/14/13  1:33 AM      Result Value Range Status   MRSA by PCR NEGATIVE  NEGATIVE Final   Comment:            The GeneXpert MRSA Assay (FDA     approved for NASAL specimens     only), is one component of a     comprehensive MRSA colonization     surveillance program. It is not     intended to diagnose MRSA     infection nor to guide or     monitor treatment for     MRSA infections.  CULTURE, RESPIRATORY (NON-EXPECTORATED)     Status: None   Collection Time    02/14/13  2:00 PM      Result Value Range Status   Specimen Description ASPIRATE   Final   Special Requests NONE   Final   Gram Stain     Final   Value: ABUNDANT WBC PRESENT, PREDOMINANTLY PMN     FEW SQUAMOUS EPITHELIAL CELLS PRESENT     FEW GRAM POSITIVE COCCI IN PAIRS     RARE GRAM NEGATIVE RODS     Performed at [facility]   Culture     Final   Value: Non-Pathogenic Oropharyngeal-type Flora Isolated.     Performed at [facility]   Report Status 02/16/2013 FINAL   Final  URINE CULTURE     Status: None   Collection Time    02/16/13 10:25 AM      Result Value Range Status   Specimen Description URINE, CATHETERIZED   Final   Special Requests NONE   Final   Culture  Setup Time     Final   Value: 02/16/2013 14:29     Performed at [facility]   Colony Count     Final   Value: NO GROWTH     Performed at [facility]   Culture     Final   Value: NO GROWTH     Performed at [facility]   Report Status 02/17/2013 FINAL   Final      Lab Results  Component Value Date   CALCIUM 8.6 02/17/2013   PHOS 1.9* 05/13/2011      Impression: 1)Renal  AKI secondary to ATN                NON oliguric ATN                 AKI on CKD                AKI sec to Hypotension + ACE + vanco ?               CKD stage 3 .               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 Secondary Hyperparathyroidism w/u pending  Phosphorus low sec to decreased intake Vitamin 25-OH will check.   5)REsp- admit ed with resp failure Intubated Primary MD following  6)FEN  Normokalemic NOrmonatremic   7)Acid base Co2 at goal     Plan:  Will ask for renal u/s wil suggest pharmacy to chec vanco levels Will ask for FENA Follow I/O Aviod ACE Will ask for CKD-BMD w/p Will ask for proteinuria w/u      BHUTANI,MANPREET S 02/17/2013, 3:11 PM

## 2013-02-17 NOTE — Progress Notes (Signed)
Subjective: He remains intubated and on a ventilator this morning his blood gases not as good as yesterday in his renal function looks worse.  Objective: Vital signs in last 24 hours: Temp:  [97.9 F (36.6 C)-100.9 F (38.3 C)] 99.3 F (37.4 C) (12/10 0400) Pulse Rate:  [107-151] 111 (12/10 0500) Resp:  [14-28] 23 (12/10 0500) BP: (96-185)/(48-91) 124/55 mmHg (12/10 0500) SpO2:  [88 %-100 %] 96 % (12/10 0726) Arterial Line BP: (126-271)/(61-144) 178/78 mmHg (12/10 0500) FiO2 (%):  [30 %-45 %] 35 % (12/10 0808) Weight:  [108.6 kg (239 lb 6.7 oz)] 108.6 kg (239 lb 6.7 oz) (12/10 0500) Weight change: -0.1 kg (-3.5 oz) Last BM Date: 02/16/13  Intake/Output from previous day: 12/09 0701 - 12/10 0700 In: 3598.5 [I.V.:1609.8; NG/GT:1038.7; IV Piggyback:950] Out: 1350 [Urine:1350]  PHYSICAL EXAM General appearance: morbidly obese and Intubated sedated and on the ventilator Resp: rhonchi bilaterally Cardio: regular rate and rhythm, S1, S2 normal, no murmur, click, rub or gallop GI: soft, non-tender; bowel sounds normal; no masses,  no organomegaly Extremities: Venous stasis and 1+ edema of the feet  Lab Results:    Basic Metabolic Panel:  Recent Labs  40/98/11 0418 02/17/13 0425  NA 142 143  K 3.3* 4.1  CL 101 100  CO2 31 27  GLUCOSE 185* 141*  BUN 33* 57*  CREATININE 1.58* 2.29*  CALCIUM 8.5 8.6   Liver Function Tests: No results found for this basename: AST, ALT, ALKPHOS, BILITOT, PROT, ALBUMIN,  in the last 72 hours No results found for this basename: LIPASE, AMYLASE,  in the last 72 hours No results found for this basename: AMMONIA,  in the last 72 hours CBC:  Recent Labs  02/16/13 0418 02/17/13 0425  WBC 12.0* 12.9*  NEUTROABS 11.4* 11.9*  HGB 11.6* 12.1*  HCT 36.3* 38.5*  MCV 96.8 99.0  PLT 188 213   Cardiac Enzymes: No results found for this basename: CKTOTAL, CKMB, CKMBINDEX, TROPONINI,  in the last 72 hours BNP:  Recent Labs  02/16/13 0912   PROBNP 1131.0*   D-Dimer: No results found for this basename: DDIMER,  in the last 72 hours CBG:  Recent Labs  02/16/13 1133 02/16/13 1628 02/16/13 1950 02/16/13 2354 02/17/13 0411 02/17/13 0740  GLUCAP 114* 133* 152* 127* 137* 162*   Hemoglobin A1C: No results found for this basename: HGBA1C,  in the last 72 hours Fasting Lipid Panel:  Recent Labs  02/17/13 0425  TRIG 336*   Thyroid Function Tests: No results found for this basename: TSH, T4TOTAL, FREET4, T3FREE, THYROIDAB,  in the last 72 hours Anemia Panel: No results found for this basename: VITAMINB12, FOLATE, FERRITIN, TIBC, IRON, RETICCTPCT,  in the last 72 hours Coagulation: No results found for this basename: LABPROT, INR,  in the last 72 hours Urine Drug Screen: Drugs of Abuse     Component Value Date/Time   LABOPIA NONE DETECTED 03/01/2012 0019   COCAINSCRNUR POSITIVE* 03/01/2012 0019   LABBENZ NONE DETECTED 03/01/2012 0019   AMPHETMU NONE DETECTED 03/01/2012 0019   THCU NONE DETECTED 03/01/2012 0019   LABBARB NONE DETECTED 03/01/2012 0019    Alcohol Level: No results found for this basename: ETH,  in the last 72 hours Urinalysis:  Recent Labs  02/16/13 1026  LABSPEC >1.030*  PHURINE 5.5  GLUCOSEU NEGATIVE  HGBUR LARGE*  BILIRUBINUR NEGATIVE  KETONESUR NEGATIVE  PROTEINUR 30*  UROBILINOGEN 0.2  NITRITE NEGATIVE  LEUKOCYTESUR NEGATIVE   Misc. Labs:  ABGS  Recent Labs  02/17/13 0500  PHART 7.276*  PO2ART 105.0*  TCO2 24.1  HCO3 25.6*   CULTURES Recent Results (from the past 240 hour(s))  MRSA PCR SCREENING     Status: None   Collection Time    02/14/13  1:33 AM      Result Value Range Status   MRSA by PCR NEGATIVE  NEGATIVE Final   Comment:            The GeneXpert MRSA Assay (FDA     approved for NASAL specimens     only), is one component of a     comprehensive MRSA colonization     surveillance program. It is not     intended to diagnose MRSA     infection nor to  guide or     monitor treatment for     MRSA infections.  CULTURE, RESPIRATORY (NON-EXPECTORATED)     Status: None   Collection Time    02/14/13  2:00 PM      Result Value Range Status   Specimen Description ASPIRATE   Final   Special Requests NONE   Final   Gram Stain     Final   Value: ABUNDANT WBC PRESENT, PREDOMINANTLY PMN     FEW SQUAMOUS EPITHELIAL CELLS PRESENT     FEW GRAM POSITIVE COCCI IN PAIRS     RARE GRAM NEGATIVE RODS     Performed at [facility]   Culture     Final   Value: Non-Pathogenic Oropharyngeal-type Flora Isolated.     Performed at [facility]   Report Status 02/16/2013 FINAL   Final   Studies/Results: Dg Chest Port 1 View  02/17/2013   CLINICAL DATA:  Respiratory failure.  EXAM: PORTABLE CHEST - 1 VIEW  COMPARISON:  Chest x-ray of February 16, 2013.  FINDINGS: The lungs are well-expanded. There is no focal infiltrate. The interstitial markings are mildly prominent. The Cardiopericardial silhouette is top-normal in size. The pulmonary vascularity is prominent centrally. The There is no pleural effusion or pneumothorax.  The endotracheal tube tip lies approximately 4 cm above the crotch of the carina. The esophagogastric tube tip is not visible.  IMPRESSION: Allowing for slight differences in positioning there has not been significant interval change in the appearance of the chest since yesterday's study.   Electronically Signed   By: David  [place]   On: 02/17/2013 07:47   Dg Chest Port 1 View  02/16/2013   CLINICAL DATA:  Change in respiratory status.  EXAM: PORTABLE CHEST - 1 VIEW  COMPARISON:  02/16/2013 at 0646 hr  FINDINGS: Endotracheal tube remains in place with tip projecting approximately 5.5 cm above the carina. Enteric tube has been advanced and courses towards the upper abdomen with tip not imaged. The cardiac silhouette is mildly enlarged. The lungs are well inflated with minimal bibasilar atelectasis. There is no evidence of segmental  airspace consolidation, edema, or large pleural effusion. Left costophrenic angle is incompletely imaged.  IMPRESSION: Minimal bibasilar atelectasis.   Electronically Signed   By: Sebastian Ache   On: 02/16/2013 18:40   Dg Chest Port 1 View  02/16/2013   CLINICAL DATA:  Respiratory failure, history hypertension, COPD, asthma, CHF  EXAM: PORTABLE CHEST - 1 VIEW  COMPARISON:  Portable exam 0646 hr compared to 02/15/2013  FINDINGS: Tip of endotracheal tube projects 6.0 cm above carinal.  Nasogastric tube is visualized only mid thoracic esophagus the level of the AP window.  Enlargement of cardiac silhouette.  Tortuous aorta.  Pulmonary  vascularity normal.  Minimal atelectasis in right mid lung and left base.  No definite infiltrate, pleural effusion or pneumothorax.  IMPRESSION: Minimal atelectasis bilaterally.  Tip of nasogastric tube is at the mid thoracic esophagus level; recommend advancing tube 28 cm.  I discussed this finding with the patient's nurse; tube had already been advanced prior to dictation of this report.   Electronically Signed   By: Ulyses Southward M.D.   On: 02/16/2013 09:19    Medications:  Prior to Admission:  Prescriptions prior to admission  Medication Sig Dispense Refill  . acetaminophen-codeine (TYLENOL #3) 300-30 MG per tablet Take 1 tablet by mouth every 4 (four) hours as needed for pain.  60 tablet  5  . albuterol (PROVENTIL HFA;VENTOLIN HFA) 108 (90 BASE) MCG/ACT inhaler Inhale 2 puffs into the lungs as needed for wheezing (rescue inhaler).       Marland Kitchen albuterol (PROVENTIL) (2.5 MG/3ML) 0.083% nebulizer solution Take 2.5 mg by nebulization every 6 (six) hours as needed for wheezing or shortness of breath.      . cloNIDine (CATAPRES) 0.2 MG tablet Take 0.2 mg by mouth 3 (three) times daily.      Marland Kitchen docusate sodium (COLACE) 100 MG capsule Take 100 mg by mouth 2 (two) times daily as needed for mild constipation.      . fluticasone (CUTIVATE) 0.05 % cream Apply 1 application topically  daily as needed (rash).       . furosemide (LASIX) 40 MG tablet Take 1 tablet (40 mg total) by mouth 2 (two) times daily.  30 tablet  5  . guaiFENesin (MUCINEX) 600 MG 12 hr tablet Take 1,200 mg by mouth at bedtime as needed for to loosen phlegm.       Marland Kitchen HYDROcodone-acetaminophen (NORCO/VICODIN) 5-325 MG per tablet Take 1 tablet by mouth every 6 (six) hours as needed for pain.  120 tablet  0  . hydrocortisone (ANUSOL-HC) 25 MG suppository Place 25 mg rectally daily as needed for hemorrhoids.      Marland Kitchen levofloxacin (LEVAQUIN) 500 MG tablet Take 1 tablet (500 mg total) by mouth daily.  7 tablet  0  . lisinopril (PRINIVIL,ZESTRIL) 40 MG tablet Take 40 mg by mouth daily.       . methotrexate (RHEUMATREX) 2.5 MG tablet Take 10 mg by mouth once a week. Saturday      . mometasone-formoterol (DULERA) 200-5 MCG/ACT AERO Inhale 2 puffs into the lungs 2 (two) times daily.  1 Inhaler  12  . pantoprazole (PROTONIX) 40 MG tablet Take 1 tablet (40 mg total) by mouth 2 (two) times daily.  60 tablet  12  . predniSONE (DELTASONE) 10 MG tablet Take 10 mg by mouth 2 (two) times daily.      . predniSONE (DELTASONE) 10 MG tablet Take 4 daily for 3 days then 3 daily for 3 days then 2 dialy  100 tablet  1  . ranitidine (ZANTAC) 300 MG tablet Take 300 mg by mouth at bedtime.       Scheduled: . antiseptic oral rinse  15 mL Mouth Rinse QID  . chlorhexidine  15 mL Mouth/Throat BID  . feeding supplement (VITAL HIGH PROTEIN)  1,000 mL Per Tube Q24H  . furosemide  40 mg Intravenous Q12H  . heparin  5,000 Units Subcutaneous Q8H  . insulin aspart  0-20 Units Subcutaneous TID WC  . ipratropium  0.5 mg Nebulization Q4H  . levalbuterol  0.63 mg Nebulization Q4H  . methylPREDNISolone (SOLU-MEDROL) injection  125 mg Intravenous  Q6H  . pantoprazole (PROTONIX) IV  40 mg Intravenous QHS  . piperacillin-tazobactam (ZOSYN)  IV  3.375 g Intravenous Q8H  . vancomycin  1,500 mg Intravenous Q24H   Continuous: . sodium chloride 50 mL/hr  at 02/17/13 0300  . fentaNYL infusion INTRAVENOUS    . propofol 40 mcg/kg/min (02/17/13 0806)   KGM:WNUUVO chloride, fentaNYL, hydrALAZINE, LORazepam  Assesment: He has acute on chronic respiratory failure multifactorial. He is being treated as healthcare associated pneumonia. His CO2 is higher and his pH is down.  He has acute on chronic renal failure which is worse and I'm going to ask for renal consultation.  He has acute on chronic diastolic heart failure. His BNP yesterday was slightly elevated over his previous baseline BNP of around 6-800. He has diabetes which is being treated. He has been hypokalemic but that has resolved   Principal Problem:   Respiratory failure, acute Active Problems:   COPD exacerbation   Acute on chronic renal failure   Acute-on-chronic respiratory failure   Bilateral lower extremity edema   Obstructive sleep apnea   Fever   Acute respiratory failure   Acute on chronic diastolic heart failure   Hypokalemia    Plan: He's going to get a PICC line. I'm going to ask for nephrology consultation. Continue with Lasix for now he'll have a lactate level. In addition his triglycerides are elevated so I'm going to see if we can get him off of the present and I will switch him to a fentanyl drip.    LOS: 4 days   Elanie Hammitt L 02/17/2013, 8:23 AM

## 2013-02-17 NOTE — Progress Notes (Signed)
Nutrition Follow-up  INTERVENTION: Tube feeding held per nursing report will follow-up in a.m.   Recommend consider per Adult Enteral protocol:  If residuals >464ml re-feed entire residual continue tube feeding and re-check in 1 hour.   If residuals remain >494ml re-feed entire residual and decrease tube feeding rate 50% and re-check in 2 hours.   If residual remains >400 ml re-feed entire residual and notify MD for consideration of prokinetic or further instructions.   If residuals < 400 ml re-feed and resume previous rate.  NUTRITION DIAGNOSIS: Inadequate oral intake related to inability to eat as evidenced by NPO status.  Goal: Enteral nutrition to provide 60-70% of estimated calorie needs (22-25 kcals/kg ideal body weight or 1780-2022 kcal/day) and 100% of estimated protein needs, based on ASPEN guidelines for permissive underfeeding in critically ill obese individuals.  Monitor:  Respiratory status, nutrition support tolerance, labs, I/O's and wt changes  Reason for Assessment: Enteral nutrition management consult  57 y.o. male  Admitting Dx: Respiratory failure, acute  ASSESSMENT: Patient is currently intubated on ventilator support. Formula changed to increase protein intake. Vital HP @ 40 ml/hr is delivering 960 kcal, 84 gr protein and 802 ml water daily. MV: 8.7 L/min Temp (24hrs), Avg:99.1 F (37.3 C), Min:96.5 F (35.8 C), Max:100.9 F (38.3 C)  Propofol: 31.3 ml/hr provides 826 lipid kcal daily   Height: Ht Readings from Last 1 Encounters:  02/15/13 6' (1.829 m)    Weight: Wt Readings from Last 1 Encounters:  02/17/13 239 lb 6.7 oz (108.6 kg)    Ideal Body Weight: 178# (80.9 kg)  % Ideal Body Weight: 134%  Wt Readings from Last 10 Encounters:  02/17/13 239 lb 6.7 oz (108.6 kg)  01/19/13 230 lb 4.8 oz (104.463 kg)  01/03/13 220 lb 14.4 oz (100.2 kg)  12/29/12 225 lb (102.059 kg)  12/16/12 224 lb 12 oz (101.946 kg)  12/01/12 227 lb 4.7 oz (103.1  kg)  10/07/12 230 lb (104.327 kg)  07/23/12 240 lb 1.3 oz (108.9 kg)  04/30/12 232 lb 12.8 oz (105.597 kg)  03/12/12 229 lb 15 oz (104.3 kg)    Usual Body Weight: 224-238#  % Usual Body Weight: 100%  BMI:  Body mass index is 32.46 kg/(m^2).obesity class I  Estimated Nutritional Needs: Kcal: 2037  Protein: 121-137 gr Fluid: >2200 ml daily  Skin:  intact  Diet Order: NPO  EDUCATION NEEDS: -No education needs identified at this time   Intake/Output Summary (Last 24 hours) at 02/17/13 1536 Last data filed at 02/17/13 1400  Gross per 24 hour  Intake 3392.6 ml  Output   2200 ml  Net 1192.6 ml    Labs:   Recent Labs Lab 02/15/13 0433 02/16/13 0418 02/17/13 0425  NA 142 142 143  K 3.3* 3.3* 4.1  CL 98 101 100  CO2 29 31 27   BUN 26* 33* 57*  CREATININE 1.87* 1.58* 2.29*  CALCIUM 9.2 8.5 8.6  GLUCOSE 169* 185* 141*    CBG (last 3)   Recent Labs  02/17/13 0411 02/17/13 0740 02/17/13 1147  GLUCAP 137* 162* 146*    Scheduled Meds: . antiseptic oral rinse  15 mL Mouth Rinse QID  . chlorhexidine  15 mL Mouth/Throat BID  . furosemide  40 mg Intravenous Q12H  . heparin  5,000 Units Subcutaneous Q8H  . insulin aspart  0-20 Units Subcutaneous TID WC  . ipratropium  0.5 mg Nebulization Q4H  . levalbuterol  0.63 mg Nebulization Q4H  . methylPREDNISolone (SOLU-MEDROL) injection  125 mg Intravenous Q6H  . pantoprazole (PROTONIX) IV  40 mg Intravenous QHS  . piperacillin-tazobactam (ZOSYN)  IV  3.375 g Intravenous Q8H  . vancomycin  1,500 mg Intravenous Q24H    Continuous Infusions: . sodium chloride 50 mL/hr at 02/17/13 0300  . fentaNYL infusion INTRAVENOUS    . propofol 40 mcg/kg/min (02/17/13 1239)    Past Medical History  Diagnosis Date  . Hypertension   . Dyspnea   . Chronic kidney disease, stage 2, mildly decreased GFR     Creatinine of 1.31 in 04/2011  . COPD (chronic obstructive pulmonary disease)     moderate by spirometry in 08/2009;FEV1 of 1.1  ;nl alpha -1 antitrypsin   . Obesity   . Psoriasis   . Left eye trauma     with loss of vision  . Erectile dysfunction   . Lumbosacral spinal stenosis     chronic low back pain  . Headache(784.0)   . Sleep apnea     O2 at night  . Carotid artery aneurysm   . Asthma   . Psoriasis   . CHF (congestive heart failure)   . Renal insufficiency     Past Surgical History  Procedure Laterality Date  . Orif patella  12/14/2005    left fracture Dr.Harrison   . Ophthalmologic surgery  1963    secondary to left eye trauma, as a child hit in eye with tree limb   . Colonoscopy N/A 05/06/2012    RMR: internal hemorrhoids, tubular adenoma, cecal ulcerations with benign path, repeat in 2019  . Esophagogastroduodenoscopy  05/06/12    RMR: probably cervical esophageal web s/p 19 F dilation, hiatal hernia, antral and bulbar erosions with negative H.pylori    Royann Shivers MS,RD,CSG,LDN Office: [phone] Pager: [phone]

## 2013-02-17 NOTE — Progress Notes (Addendum)
Nutrition Follow-up  INTERVENTION: Tube feeding held per nursing report will follow-up in a.m.   NUTRITION DIAGNOSIS: Inadequate oral intake related to inability to eat as evidenced by NPO status.  Goal: Enteral nutrition to provide 60-70% of estimated calorie needs (22-25 kcals/kg ideal body weight or 1780-2022 kcal/day) and 100% of estimated protein needs, based on ASPEN guidelines for permissive underfeeding in critically ill obese individuals.  Monitor:  Respiratory status, nutrition support tolerance, labs, I/O's and wt changes  Reason for Assessment: Enteral nutrition management consult  57 y.o. male  Admitting Dx: Respiratory failure, acute  ASSESSMENT: Patient is currently intubated on ventilator support. Formula changed to increase protein intake. Vital HP @ 40 ml/hr is delivering 960 kcal, 84 gr protein and 802 ml water daily. MV: 8.7 L/min Temp (24hrs), Avg:99.1 F (37.3 C), Min:96.5 F (35.8 C), Max:100.9 F (38.3 C)  Propofol: 31.3 ml/hr provides 826 lipid kcal daily   Height: Ht Readings from Last 1 Encounters:  02/15/13 6' (1.829 m)    Weight: Wt Readings from Last 1 Encounters:  02/17/13 239 lb 6.7 oz (108.6 kg)    Ideal Body Weight: 178# (80.9 kg)  % Ideal Body Weight: 134%  Wt Readings from Last 10 Encounters:  02/17/13 239 lb 6.7 oz (108.6 kg)  01/19/13 230 lb 4.8 oz (104.463 kg)  01/03/13 220 lb 14.4 oz (100.2 kg)  12/29/12 225 lb (102.059 kg)  12/16/12 224 lb 12 oz (101.946 kg)  12/01/12 227 lb 4.7 oz (103.1 kg)  10/07/12 230 lb (104.327 kg)  07/23/12 240 lb 1.3 oz (108.9 kg)  04/30/12 232 lb 12.8 oz (105.597 kg)  03/12/12 229 lb 15 oz (104.3 kg)    Usual Body Weight: 224-238#  % Usual Body Weight: 100%  BMI:  Body mass index is 32.46 kg/(m^2).obesity class I  Estimated Nutritional Needs: Kcal: 2037  Protein: 121-137 gr Fluid: >2200 ml daily  Skin:  intact  Diet Order: NPO  EDUCATION NEEDS: -No education needs identified at  this time   Intake/Output Summary (Last 24 hours) at 02/17/13 1537 Last data filed at 02/17/13 1400  Gross per 24 hour  Intake 3392.6 ml  Output   2200 ml  Net 1192.6 ml    Labs:   Recent Labs Lab 02/15/13 0433 02/16/13 0418 02/17/13 0425  NA 142 142 143  K 3.3* 3.3* 4.1  CL 98 101 100  CO2 29 31 27   BUN 26* 33* 57*  CREATININE 1.87* 1.58* 2.29*  CALCIUM 9.2 8.5 8.6  GLUCOSE 169* 185* 141*    CBG (last 3)   Recent Labs  02/17/13 0411 02/17/13 0740 02/17/13 1147  GLUCAP 137* 162* 146*    Scheduled Meds: . antiseptic oral rinse  15 mL Mouth Rinse QID  . chlorhexidine  15 mL Mouth/Throat BID  . furosemide  40 mg Intravenous Q12H  . heparin  5,000 Units Subcutaneous Q8H  . insulin aspart  0-20 Units Subcutaneous TID WC  . ipratropium  0.5 mg Nebulization Q4H  . levalbuterol  0.63 mg Nebulization Q4H  . methylPREDNISolone (SOLU-MEDROL) injection  125 mg Intravenous Q6H  . pantoprazole (PROTONIX) IV  40 mg Intravenous QHS  . piperacillin-tazobactam (ZOSYN)  IV  3.375 g Intravenous Q8H  . vancomycin  1,500 mg Intravenous Q24H    Continuous Infusions: . sodium chloride 50 mL/hr at 02/17/13 0300  . fentaNYL infusion INTRAVENOUS    . propofol 40 mcg/kg/min (02/17/13 1239)    Past Medical History  Diagnosis Date  . Hypertension   .  Dyspnea   . Chronic kidney disease, stage 2, mildly decreased GFR     Creatinine of 1.31 in 04/2011  . COPD (chronic obstructive pulmonary disease)     moderate by spirometry in 08/2009;FEV1 of 1.1 ;nl alpha -1 antitrypsin   . Obesity   . Psoriasis   . Left eye trauma     with loss of vision  . Erectile dysfunction   . Lumbosacral spinal stenosis     chronic low back pain  . Headache(784.0)   . Sleep apnea     O2 at night  . Carotid artery aneurysm   . Asthma   . Psoriasis   . CHF (congestive heart failure)   . Renal insufficiency     Past Surgical History  Procedure Laterality Date  . Orif patella  12/14/2005     left fracture Dr.Harrison   . Ophthalmologic surgery  1963    secondary to left eye trauma, as a child hit in eye with tree limb   . Colonoscopy N/A 05/06/2012    RMR: internal hemorrhoids, tubular adenoma, cecal ulcerations with benign path, repeat in 2019  . Esophagogastroduodenoscopy  05/06/12    RMR: probably cervical esophageal web s/p 31 F dilation, hiatal hernia, antral and bulbar erosions with negative H.pylori    Royann Shivers MS,RD,CSG,LDN Office: [phone] Pager: [phone]

## 2013-02-17 NOTE — Progress Notes (Signed)
Per Dr.Hawkins ventilator rate increased, and patient was rested no weaning today.

## 2013-02-18 ENCOUNTER — Inpatient Hospital Stay (HOSPITAL_COMMUNITY): Payer: Medicare HMO

## 2013-02-18 DIAGNOSIS — I359 Nonrheumatic aortic valve disorder, unspecified: Secondary | ICD-10-CM

## 2013-02-18 LAB — BASIC METABOLIC PANEL
BUN: 64 mg/dL — ABNORMAL HIGH (ref 6–23)
CO2: 26 mEq/L (ref 19–32)
Calcium: 8.5 mg/dL (ref 8.4–10.5)
Chloride: 104 mEq/L (ref 96–112)
Creatinine, Ser: 1.8 mg/dL — ABNORMAL HIGH (ref 0.50–1.35)
GFR calc Af Amer: 46 mL/min — ABNORMAL LOW (ref >90)
GFR calc non Af Amer: 40 mL/min — ABNORMAL LOW (ref >90)
Glucose, Bld: 146 mg/dL — ABNORMAL HIGH (ref 70–99)
Potassium: 3.5 mEq/L (ref 3.5–5.1)
Sodium: 146 mEq/L — ABNORMAL HIGH (ref 135–145)

## 2013-02-18 LAB — GLUCOSE, CAPILLARY
Glucose-Capillary: 139 mg/dL — ABNORMAL HIGH (ref 70–99)
Glucose-Capillary: 136 mg/dL — ABNORMAL HIGH (ref 70–99)
Glucose-Capillary: 145 mg/dL — ABNORMAL HIGH (ref 70–99)
Glucose-Capillary: 123 mg/dL — ABNORMAL HIGH (ref 70–99)

## 2013-02-18 LAB — BLOOD GAS, ARTERIAL
Acid-Base Excess: 1.7 mmol/L (ref 0.0–2.0)
Bicarbonate: 26.5 mEq/L — ABNORMAL HIGH (ref 20.0–24.0)
FIO2: 0.35 %
MECHVT: 620 mL
O2 Saturation: 94.8 %
TCO2: 24.6 mmol/L (ref 0–100)
pCO2 arterial: 47.8 mmHg — ABNORMAL HIGH (ref 35.0–45.0)
pH, Arterial: 7.363 (ref 7.350–7.450)

## 2013-02-18 LAB — PHOSPHORUS: Phosphorus: 5.4 mg/dL — ABNORMAL HIGH (ref 2.3–4.6)

## 2013-02-18 MED ORDER — FENTANYL CITRATE 0.05 MG/ML IJ SOLN
INTRAMUSCULAR | Status: AC
  Filled 2013-02-18: qty 50

## 2013-02-18 MED ORDER — VANCOMYCIN HCL 10 G IV SOLR
1250.0000 mg | INTRAVENOUS | Status: DC
  Administered 2013-02-18 – 2013-02-20 (×3): 1250 mg via INTRAVENOUS
  Filled 2013-02-18 (×4): qty 1250

## 2013-02-18 MED ORDER — POTASSIUM CL IN DEXTROSE 5% 20 MEQ/L IV SOLN
20.0000 meq | INTRAVENOUS | Status: DC
  Administered 2013-02-18: 20 meq via INTRAVENOUS

## 2013-02-18 NOTE — Progress Notes (Signed)
ANTIBIOTIC CONSULT NOTE  Pharmacy Consult for Vancomycin and Zosyn Indication: pneumonia  Allergies  Allergen Reactions  . Bee Venom Anaphylaxis    Patient has an epi pen   Patient Measurements: Weight 108.6 kg Height approx 72 inches  Vital Signs: Temp: 98 F (36.7 C) (12/11 0400) Temp src: Oral (12/11 0400) BP: 123/63 mmHg (12/11 0600) Pulse Rate: 82 (12/11 0500)  Labs:  Recent Labs  02/16/13 0418 02/17/13 0425 02/17/13 1628 02/18/13 0426  WBC 12.0* 12.9*  --   --   HGB 11.6* 12.1*  --   --   PLT 188 213  --   --   LABCREA  --   --  60.78  --   CREATININE 1.58* 2.29*  --  1.80*   Medical History: Past Medical History  Diagnosis Date  . Hypertension   . Dyspnea   . Chronic kidney disease, stage 2, mildly decreased GFR     Creatinine of 1.31 in 04/2011  . COPD (chronic obstructive pulmonary disease)     moderate by spirometry in 08/2009;FEV1 of 1.1 ;nl alpha -1 antitrypsin   . Obesity   . Psoriasis   . Left eye trauma     with loss of vision  . Erectile dysfunction   . Lumbosacral spinal stenosis     chronic low back pain  . Headache(784.0)   . Sleep apnea     O2 at night  . Carotid artery aneurysm   . Asthma   . Psoriasis   . CHF (congestive heart failure)   . Renal insufficiency    Estimated Creatinine Clearance: 57.3 ml/min (by C-G formula based on Cr of 1.8).  Assessment: Pt is a 57 yo M presenting with acute respiratory failure requiring intubation, purulent secretions, and a fever. Vancomycin and Zosyn being initiated for possible HCAP. SCr is elevated.  Normalized ClCr = 40-45 ml/min. 24hr I/O= -27ml. Vancomycin trough is slightly above desired goal range.   Zosyn 12/7 >> Vancomycin 12/7 >>  Goal of Therapy:  Vancomycin trough level 15-20 mcg/ml  Plan:  Decrease Vancomycin 1250mg  IV q24hrs Check weekly trough while on Vancomycin Continue Zosyn 3.375gm IV q8h, each dose infused over 4 hrs Monitor labs, renal fxn, and cultures Duration  of therapy per MD.  No noted MRSA per cx data- consider d/c Vanc.   Elson Clan, RPH 02/18/2013,7:55 AM

## 2013-02-18 NOTE — Progress Notes (Signed)
*  PRELIMINARY RESULTS* Echocardiogram 2D Echocardiogram has been performed.  Gagandeep Kossman 02/18/2013, 4:22 PM
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
## 2013-02-18 NOTE — Progress Notes (Signed)
Subjective: He looks the best he has been since she's been in the hospital. He is awake and alert and much calm her and his heart rate and blood pressure are better.  Objective: Vital signs in last 24 hours: Temp:  [96.5 F (35.8 C)-98.6 F (37 C)] 98 F (36.7 C) (12/11 0400) Pulse Rate:  [75-103] 82 (12/11 0500) Resp:  [18-19] 18 (12/11 0600) BP: (90-123)/(38-63) 123/63 mmHg (12/11 0600) SpO2:  [94 %-100 %] 97 % (12/11 0500) Arterial Line BP: (105-179)/(42-75) 164/56 mmHg (12/11 0600) FiO2 (%):  [35 %] 35 % (12/11 0812) Weight:  [109.8 kg (242 lb 1 oz)] 109.8 kg (242 lb 1 oz) (12/11 0257) Weight change: 1.2 kg (2 lb 10.3 oz) Last BM Date: 02/16/13  Intake/Output from previous day: 12/10 0701 - 12/11 0700 In: 2703 [I.V.:1853; NG/GT:200; IV Piggyback:650] Out: 2700 [Urine:2700]  PHYSICAL EXAM General appearance: alert, cooperative, mild distress and Intubated and on mechanical ventilation Resp: clear to auscultation bilaterally Cardio: regular rate and rhythm, S1, S2 normal, no murmur, click, rub or gallop GI: soft, non-tender; bowel sounds normal; no masses,  no organomegaly Extremities: extremities normal, atraumatic, no cyanosis or edema  Lab Results:    Basic Metabolic Panel:  Recent Labs  16/10/96 0425 02/18/13 0426  NA 143 146*  K 4.1 3.5  CL 100 104  CO2 27 26  GLUCOSE 141* 146*  BUN 57* 64*  CREATININE 2.29* 1.80*  CALCIUM 8.6 8.5  PHOS  --  5.4*   Liver Function Tests: No results found for this basename: AST, ALT, ALKPHOS, BILITOT, PROT, ALBUMIN,  in the last 72 hours No results found for this basename: LIPASE, AMYLASE,  in the last 72 hours No results found for this basename: AMMONIA,  in the last 72 hours CBC:  Recent Labs  02/16/13 0418 02/17/13 0425  WBC 12.0* 12.9*  NEUTROABS 11.4* 11.9*  HGB 11.6* 12.1*  HCT 36.3* 38.5*  MCV 96.8 99.0  PLT 188 213   Cardiac Enzymes: No results found for this basename: CKTOTAL, CKMB, CKMBINDEX,  TROPONINI,  in the last 72 hours BNP:  Recent Labs  02/16/13 0912  PROBNP 1131.0*   D-Dimer: No results found for this basename: DDIMER,  in the last 72 hours CBG:  Recent Labs  02/17/13 1147 02/17/13 1634 02/17/13 1928 02/17/13 2325 02/18/13 0259 02/18/13 0739  GLUCAP 146* 138* 139* 127* 139* 136*   Hemoglobin A1C: No results found for this basename: HGBA1C,  in the last 72 hours Fasting Lipid Panel:  Recent Labs  02/17/13 0425  TRIG 336*   Thyroid Function Tests: No results found for this basename: TSH, T4TOTAL, FREET4, T3FREE, THYROIDAB,  in the last 72 hours Anemia Panel: No results found for this basename: VITAMINB12, FOLATE, FERRITIN, TIBC, IRON, RETICCTPCT,  in the last 72 hours Coagulation: No results found for this basename: LABPROT, INR,  in the last 72 hours Urine Drug Screen: Drugs of Abuse     Component Value Date/Time   LABOPIA NONE DETECTED 03/01/2012 0019   COCAINSCRNUR POSITIVE* 03/01/2012 0019   LABBENZ NONE DETECTED 03/01/2012 0019   AMPHETMU NONE DETECTED 03/01/2012 0019   THCU NONE DETECTED 03/01/2012 0019   LABBARB NONE DETECTED 03/01/2012 0019    Alcohol Level: No results found for this basename: ETH,  in the last 72 hours Urinalysis:  Recent Labs  02/16/13 1026  LABSPEC >1.030*  PHURINE 5.5  GLUCOSEU NEGATIVE  HGBUR LARGE*  BILIRUBINUR NEGATIVE  KETONESUR NEGATIVE  PROTEINUR 30*  UROBILINOGEN 0.2  NITRITE NEGATIVE  LEUKOCYTESUR NEGATIVE   Misc. Labs:  ABGS  Recent Labs  02/18/13 0500  PHART 7.363  PO2ART 81.3  TCO2 24.6  HCO3 26.5*   CULTURES Recent Results (from the past 240 hour(s))  MRSA PCR SCREENING     Status: None   Collection Time    02/14/13  1:33 AM      Result Value Range Status   MRSA by PCR NEGATIVE  NEGATIVE Final   Comment:            The GeneXpert MRSA Assay (FDA     approved for NASAL specimens     only), is one component of a     comprehensive MRSA colonization     surveillance program.  It is not     intended to diagnose MRSA     infection nor to guide or     monitor treatment for     MRSA infections.  CULTURE, RESPIRATORY (NON-EXPECTORATED)     Status: None   Collection Time    02/14/13  2:00 PM      Result Value Range Status   Specimen Description ASPIRATE   Final   Special Requests NONE   Final   Gram Stain     Final   Value: ABUNDANT WBC PRESENT, PREDOMINANTLY PMN     FEW SQUAMOUS EPITHELIAL CELLS PRESENT     FEW GRAM POSITIVE COCCI IN PAIRS     RARE GRAM NEGATIVE RODS     Performed at [facility]   Culture     Final   Value: Non-Pathogenic Oropharyngeal-type Flora Isolated.     Performed at [facility]   Report Status 02/16/2013 FINAL   Final  URINE CULTURE     Status: None   Collection Time    02/16/13 10:25 AM      Result Value Range Status   Specimen Description URINE, CATHETERIZED   Final   Special Requests NONE   Final   Culture  Setup Time     Final   Value: 02/16/2013 14:29     Performed at [facility]   Colony Count     Final   Value: NO GROWTH     Performed at [facility]   Culture     Final   Value: NO GROWTH     Performed at [facility]   Report Status 02/17/2013 FINAL   Final   Studies/Results: US Renal  02/17/2013   CLINICAL DATA:  Acute renal failure.  EXAM: RENAL/URINARY TRACT ULTRASOUND COMPLETE  COMPARISON:  03/02/2012.  FINDINGS: Right Kidney:  Length: 10.5 cm. Renal parenchymal thinning. Increased echogenicity. No hydronephrosis. Renal cysts largest measuring 3.8 x 3 x 3.2 cm.  Left Kidney:  Length: 11.8 cm. Increased echogenicity. No hydronephrosis. Cysts measuring up to 3.2 x 2.4 x 2.5 cm.  Bladder:  Decompressed by Foley catheter.  IMPRESSION: No evidence of hydronephrosis.  Right renal parenchymal thinning.  Increased renal echogenicity bilaterally consistent result of medical renal disease.  Bilateral renal cysts noted.   Electronically Signed   By: Bridgett Larsson M.D.   On: 02/17/2013  17:32   Dg Chest Port 1 View  02/18/2013   CLINICAL DATA:  Respiratory failure.  EXAM: PORTABLE CHEST - 1 VIEW  COMPARISON:  Portable chest x-ray of February 17, 2013.  FINDINGS: The lungs are adequately inflated. There is no focal infiltrate. There is no pneumothorax or pleural effusion. The left lateral costophrenic gutter is excluded from  the film however. The cardiopericardial silhouette is normal in size. There is mild tortuosity of the descending thoracic aorta. The pulmonary vascularity is not engorged. The PICC line in place via the left upper extremity has its tip projecting in the region of the proximal to mid SVC. The esophagogastric tube tip cannot be discerned. As best as can be determined the endotracheal tube tip lies approximately 5 cm above the crotch of the carina. This appears stable.  IMPRESSION: Allowing for differences in positioning and radiographic technique, there has not been significant interval change in the appearance of the chest. There may have been slight improvement in the pulmonary interstitium suggestive of resolving interstitial edema.   Electronically Signed   By: David  [place]   On: 02/18/2013 08:10   Dg Chest Port 1 View  02/17/2013   CLINICAL DATA:  Line placement.  EXAM: PORTABLE CHEST - 1 VIEW  COMPARISON:  Plain film of the chest 02/17/2013 at 6:35 a.m.  FINDINGS: A left PICC is in place with the tip projecting over the lower superior vena cava. Endotracheal tube remains in place with the tip in good position well above the carina. NG tube is difficult to visualize but appears to course into the stomach and below the inferior margin of the film.  The lungs appear emphysematous but are clear. Heart size is normal. No pneumothorax identified.  IMPRESSION: Tip of right PICC projects over the lower superior vena cava.  No new abnormality.   Electronically Signed   By: Drusilla Kanner M.D.   On: 02/17/2013 12:00   Dg Chest Port 1 View  02/17/2013   CLINICAL DATA:   Respiratory failure.  EXAM: PORTABLE CHEST - 1 VIEW  COMPARISON:  Chest x-ray of February 16, 2013.  FINDINGS: The lungs are well-expanded. There is no focal infiltrate. The interstitial markings are mildly prominent. The Cardiopericardial silhouette is top-normal in size. The pulmonary vascularity is prominent centrally. The There is no pleural effusion or pneumothorax.  The endotracheal tube tip lies approximately 4 cm above the crotch of the carina. The esophagogastric tube tip is not visible.  IMPRESSION: Allowing for slight differences in positioning there has not been significant interval change in the appearance of the chest since yesterday's study.   Electronically Signed   By: David  [place]   On: 02/17/2013 07:47   Dg Chest Port 1 View  02/16/2013   CLINICAL DATA:  Change in respiratory status.  EXAM: PORTABLE CHEST - 1 VIEW  COMPARISON:  02/16/2013 at 0646 hr  FINDINGS: Endotracheal tube remains in place with tip projecting approximately 5.5 cm above the carina. Enteric tube has been advanced and courses towards the upper abdomen with tip not imaged. The cardiac silhouette is mildly enlarged. The lungs are well inflated with minimal bibasilar atelectasis. There is no evidence of segmental airspace consolidation, edema, or large pleural effusion. Left costophrenic angle is incompletely imaged.  IMPRESSION: Minimal bibasilar atelectasis.   Electronically Signed   By: Sebastian Ache   On: 02/16/2013 18:40    Medications:  Prior to Admission:  Prescriptions prior to admission  Medication Sig Dispense Refill  . acetaminophen-codeine (TYLENOL #3) 300-30 MG per tablet Take 1 tablet by mouth every 4 (four) hours as needed for pain.  60 tablet  5  . albuterol (PROVENTIL HFA;VENTOLIN HFA) 108 (90 BASE) MCG/ACT inhaler Inhale 2 puffs into the lungs as needed for wheezing (rescue inhaler).       Marland Kitchen albuterol (PROVENTIL) (2.5 MG/3ML) 0.083% nebulizer solution Take  2.5 mg by nebulization every 6 (six) hours as  needed for wheezing or shortness of breath.      . cloNIDine (CATAPRES) 0.2 MG tablet Take 0.2 mg by mouth 3 (three) times daily.      Marland Kitchen docusate sodium (COLACE) 100 MG capsule Take 100 mg by mouth 2 (two) times daily as needed for mild constipation.      . fluticasone (CUTIVATE) 0.05 % cream Apply 1 application topically daily as needed (rash).       . furosemide (LASIX) 40 MG tablet Take 1 tablet (40 mg total) by mouth 2 (two) times daily.  30 tablet  5  . guaiFENesin (MUCINEX) 600 MG 12 hr tablet Take 1,200 mg by mouth at bedtime as needed for to loosen phlegm.       Marland Kitchen HYDROcodone-acetaminophen (NORCO/VICODIN) 5-325 MG per tablet Take 1 tablet by mouth every 6 (six) hours as needed for pain.  120 tablet  0  . hydrocortisone (ANUSOL-HC) 25 MG suppository Place 25 mg rectally daily as needed for hemorrhoids.      Marland Kitchen levofloxacin (LEVAQUIN) 500 MG tablet Take 1 tablet (500 mg total) by mouth daily.  7 tablet  0  . lisinopril (PRINIVIL,ZESTRIL) 40 MG tablet Take 40 mg by mouth daily.       . methotrexate (RHEUMATREX) 2.5 MG tablet Take 10 mg by mouth once a week. Saturday      . mometasone-formoterol (DULERA) 200-5 MCG/ACT AERO Inhale 2 puffs into the lungs 2 (two) times daily.  1 Inhaler  12  . pantoprazole (PROTONIX) 40 MG tablet Take 1 tablet (40 mg total) by mouth 2 (two) times daily.  60 tablet  12  . predniSONE (DELTASONE) 10 MG tablet Take 10 mg by mouth 2 (two) times daily.      . predniSONE (DELTASONE) 10 MG tablet Take 4 daily for 3 days then 3 daily for 3 days then 2 dialy  100 tablet  1  . ranitidine (ZANTAC) 300 MG tablet Take 300 mg by mouth at bedtime.       Scheduled: . antiseptic oral rinse  15 mL Mouth Rinse QID  . chlorhexidine  15 mL Mouth/Throat BID  . furosemide  40 mg Intravenous Q12H  . heparin  5,000 Units Subcutaneous Q8H  . insulin aspart  0-20 Units Subcutaneous TID WC  . ipratropium  0.5 mg Nebulization Q4H  . levalbuterol  0.63 mg Nebulization Q4H  .  methylPREDNISolone (SOLU-MEDROL) injection  125 mg Intravenous Q6H  . pantoprazole (PROTONIX) IV  40 mg Intravenous QHS  . piperacillin-tazobactam (ZOSYN)  IV  3.375 g Intravenous Q8H  . vancomycin  1,250 mg Intravenous Q24H   Continuous: . dextrose 5 % with KCl 20 mEq / L 20 mEq (02/18/13 0739)  . fentaNYL infusion INTRAVENOUS 50 mcg/hr (02/18/13 0600)  . propofol Stopped (02/18/13 0806)   ZOX:WRUEAV chloride, fentaNYL, hydrALAZINE, LORazepam  Assesment: He has acute on chronic respiratory failure and seems improved. He has acute on chronic diastolic heart failure which is also better. He has acute on chronic renal failure and is being followed by nephrology now. I think he has a good chance of being able to be extubated today Principal Problem:   Respiratory failure, acute Active Problems:   COPD exacerbation   Acute on chronic renal failure   Acute-on-chronic respiratory failure   Bilateral lower extremity edema   Obstructive sleep apnea   Fever   Acute respiratory failure   Acute on chronic diastolic heart failure  Hypokalemia    Plan: Wean toward extubation today    LOS: 5 days   Jaeceon Michelin L 02/18/2013, 8:34 AM

## 2013-02-18 NOTE — Progress Notes (Signed)
Subjective: Interval History: none.  Objective: Vital signs in last 24 hours: Temp:  [96.5 F (35.8 C)-98.8 F (37.1 C)] 98 F (36.7 C) (12/11 0400) Pulse Rate:  [75-111] 82 (12/11 0500) Resp:  [16-19] 18 (12/11 0600) BP: (90-123)/(38-63) 123/63 mmHg (12/11 0600) SpO2:  [94 %-100 %] 97 % (12/11 0500) Arterial Line BP: (105-179)/(42-75) 164/56 mmHg (12/11 0600) FiO2 (%):  [35 %] 35 % (12/11 0416) Weight:  [109.8 kg (242 lb 1 oz)] 109.8 kg (242 lb 1 oz) (12/11 0257) Weight change: 1.2 kg (2 lb 10.3 oz)  Intake/Output from previous day: 12/10 0701 - 12/11 0700 In: 2673 [I.V.:1823; NG/GT:200; IV Piggyback:650] Out: 2700 [Urine:2700] Intake/Output this shift:    Generally patient is awake. Presently he is intubated hence difficult to get in information. Chest decreased breath sound anteriorly. Heart exam revealed regular rate and rhythm no murmur Abdomen: Seems to be full, tympanic but nontender and positive bowel sound Extremities: He has 1+ edema bilaterally.  Lab Results:  Recent Labs  02/16/13 0418 02/17/13 0425  WBC 12.0* 12.9*  HGB 11.6* 12.1*  HCT 36.3* 38.5*  PLT 188 213   BMET:  Recent Labs  02/17/13 0425 02/18/13 0426  NA 143 146*  K 4.1 3.5  CL 100 104  CO2 27 26  GLUCOSE 141* 146*  BUN 57* 64*  CREATININE 2.29* 1.80*  CALCIUM 8.6 8.5   No results found for this basename: PTH,  in the last 72 hours Iron Studies: No results found for this basename: IRON, TIBC, TRANSFERRIN, FERRITIN,  in the last 72 hours  Studies/Results: US Renal  02/17/2013   CLINICAL DATA:  Acute renal failure.  EXAM: RENAL/URINARY TRACT ULTRASOUND COMPLETE  COMPARISON:  03/02/2012.  FINDINGS: Right Kidney:  Length: 10.5 cm. Renal parenchymal thinning. Increased echogenicity. No hydronephrosis. Renal cysts largest measuring 3.8 x 3 x 3.2 cm.  Left Kidney:  Length: 11.8 cm. Increased echogenicity. No hydronephrosis. Cysts measuring up to 3.2 x 2.4 x 2.5 cm.  Bladder:  Decompressed  by Foley catheter.  IMPRESSION: No evidence of hydronephrosis.  Right renal parenchymal thinning.  Increased renal echogenicity bilaterally consistent result of medical renal disease.  Bilateral renal cysts noted.   Electronically Signed   By: Bridgett Larsson M.D.   On: 02/17/2013 17:32   Dg Chest Port 1 View  02/17/2013   CLINICAL DATA:  Line placement.  EXAM: PORTABLE CHEST - 1 VIEW  COMPARISON:  Plain film of the chest 02/17/2013 at 6:35 a.m.  FINDINGS: A left PICC is in place with the tip projecting over the lower superior vena cava. Endotracheal tube remains in place with the tip in good position well above the carina. NG tube is difficult to visualize but appears to course into the stomach and below the inferior margin of the film.  The lungs appear emphysematous but are clear. Heart size is normal. No pneumothorax identified.  IMPRESSION: Tip of right PICC projects over the lower superior vena cava.  No new abnormality.   Electronically Signed   By: Drusilla Kanner M.D.   On: 02/17/2013 12:00   Dg Chest Port 1 View  02/17/2013   CLINICAL DATA:  Respiratory failure.  EXAM: PORTABLE CHEST - 1 VIEW  COMPARISON:  Chest x-ray of February 16, 2013.  FINDINGS: The lungs are well-expanded. There is no focal infiltrate. The interstitial markings are mildly prominent. The Cardiopericardial silhouette is top-normal in size. The pulmonary vascularity is prominent centrally. The There is no pleural effusion or pneumothorax.  The  endotracheal tube tip lies approximately 4 cm above the crotch of the carina. The esophagogastric tube tip is not visible.  IMPRESSION: Allowing for slight differences in positioning there has not been significant interval change in the appearance of the chest since yesterday's study.   Electronically Signed   By: David  [place]   On: 02/17/2013 07:47   Dg Chest Port 1 View  02/16/2013   CLINICAL DATA:  Change in respiratory status.  EXAM: PORTABLE CHEST - 1 VIEW  COMPARISON:  02/16/2013 at  0646 hr  FINDINGS: Endotracheal tube remains in place with tip projecting approximately 5.5 cm above the carina. Enteric tube has been advanced and courses towards the upper abdomen with tip not imaged. The cardiac silhouette is mildly enlarged. The lungs are well inflated with minimal bibasilar atelectasis. There is no evidence of segmental airspace consolidation, edema, or large pleural effusion. Left costophrenic angle is incompletely imaged.  IMPRESSION: Minimal bibasilar atelectasis.   Electronically Signed   By: Sebastian Ache   On: 02/16/2013 18:40    I have reviewed the patient's current medications.  Assessment/Plan: Problem #1 acute kidney injury colon. Patient BUN and creatinine is improving. Presently his non-oliguric.  Problem #2 chronic renal failure . Patient with stage III chronic renal failure. His ultrasound didn't show any obstruction. Problem #3 hypernatremia Problem #4 hypokalemia: His potassium has corrected. Problem #5 respiratory failure. Presently patient is intubated. Problem #6 history of psoriasis Problem #7 hypertension: His blood pressures seem to be controlled. Problem #8 history of COPD/sleep apnea Plan: We'll DC normal saline           We'll continue with Lasix           Will start patient on D5w with 20 mEq of KCl at 50 cc per hour            We'll check his basic metabolic panel in the morning.  LOS: 5 days    Seda S 02/18/2013,7:13 AM

## 2013-02-18 NOTE — Progress Notes (Signed)
RT tried to wean Pt again this AM. 5/5 cpap/ps . He was on about 10 minutes and his HR increased to 140 and his BP 285/96. The Pt looked very scared and was abdominal breathing and guppy breathing in his neck. RT sat with him and asked nurse to give him some ativan. RT called Dr. And asked if she could continue to wean until we saw if the Ativan was going to work and he said we could. Wtith the ativan the pt still didn't calm down, with HR and BP still elevated. RT placed pt back on full support. We may have to just pull the ET tube without weaning.

## 2013-02-19 ENCOUNTER — Inpatient Hospital Stay (HOSPITAL_COMMUNITY): Payer: Medicare HMO

## 2013-02-19 ENCOUNTER — Encounter (HOSPITAL_COMMUNITY): Payer: Medicare HMO | Admitting: Anesthesiology

## 2013-02-19 ENCOUNTER — Inpatient Hospital Stay (HOSPITAL_COMMUNITY): Payer: Medicare HMO | Admitting: Anesthesiology

## 2013-02-19 LAB — BLOOD GAS, ARTERIAL
Acid-Base Excess: 0.9 mmol/L (ref 0.0–2.0)
Acid-Base Excess: 1.5 mmol/L (ref 0.0–2.0)
Bicarbonate: 26.2 mEq/L — ABNORMAL HIGH (ref 20.0–24.0)
FIO2: 0.35 %
FIO2: 0.35 %
MECHVT: 620 mL
MECHVT: 620 mL
O2 Saturation: 98.5 %
PEEP: 5 cmH2O
Patient temperature: 37
TCO2: 22.6 mmol/L (ref 0–100)
pCO2 arterial: 47.4 mmHg — ABNORMAL HIGH (ref 35.0–45.0)
pCO2 arterial: 38.9 mmHg (ref 35.0–45.0)
pH, Arterial: 7.361 (ref 7.350–7.450)
pH, Arterial: 7.43 (ref 7.350–7.450)

## 2013-02-19 LAB — GLUCOSE, CAPILLARY
Glucose-Capillary: 117 mg/dL — ABNORMAL HIGH (ref 70–99)
Glucose-Capillary: 133 mg/dL — ABNORMAL HIGH (ref 70–99)
Glucose-Capillary: 135 mg/dL — ABNORMAL HIGH (ref 70–99)
Glucose-Capillary: 144 mg/dL — ABNORMAL HIGH (ref 70–99)
Glucose-Capillary: 144 mg/dL — ABNORMAL HIGH (ref 70–99)

## 2013-02-19 LAB — BASIC METABOLIC PANEL
BUN: 69 mg/dL — ABNORMAL HIGH (ref 6–23)
Chloride: 108 mEq/L (ref 96–112)
GFR calc Af Amer: 54 mL/min — ABNORMAL LOW (ref >90)
Potassium: 3.6 mEq/L (ref 3.5–5.1)
Sodium: 149 mEq/L — ABNORMAL HIGH (ref 135–145)

## 2013-02-19 LAB — VITAMIN D 25 HYDROXY (VIT D DEFICIENCY, FRACTURES): Vit D, 25-Hydroxy: 16 ng/mL — ABNORMAL LOW (ref 30–89)

## 2013-02-19 LAB — PTH, INTACT AND CALCIUM
Calcium, Total (PTH): 8.3 mg/dL — ABNORMAL LOW (ref 8.4–10.5)
PTH: 248 pg/mL — ABNORMAL HIGH (ref 14.0–72.0)

## 2013-02-19 LAB — PHOSPHORUS: Phosphorus: 3.9 mg/dL (ref 2.3–4.6)

## 2013-02-19 MED ORDER — INSULIN ASPART 100 UNIT/ML ~~LOC~~ SOLN
0.0000 [IU] | SUBCUTANEOUS | Status: DC
  Administered 2013-02-19: 3 [IU] via SUBCUTANEOUS
  Administered 2013-02-20: 4 [IU] via SUBCUTANEOUS
  Administered 2013-02-20 (×3): 3 [IU] via SUBCUTANEOUS
  Administered 2013-02-20 – 2013-02-21 (×4): 4 [IU] via SUBCUTANEOUS
  Administered 2013-02-21: 3 [IU] via SUBCUTANEOUS
  Administered 2013-02-21 – 2013-02-22 (×6): 4 [IU] via SUBCUTANEOUS
  Administered 2013-02-22: 7 [IU] via SUBCUTANEOUS
  Administered 2013-02-22 – 2013-02-23 (×5): 4 [IU] via SUBCUTANEOUS
  Administered 2013-02-23: 7 [IU] via SUBCUTANEOUS
  Administered 2013-02-23 – 2013-02-24 (×2): 4 [IU] via SUBCUTANEOUS
  Administered 2013-02-24: 7 [IU] via SUBCUTANEOUS
  Administered 2013-02-24 (×3): 4 [IU] via SUBCUTANEOUS
  Administered 2013-02-24 (×2): 7 [IU] via SUBCUTANEOUS
  Administered 2013-02-25 (×2): 4 [IU] via SUBCUTANEOUS
  Administered 2013-02-25: 3 [IU] via SUBCUTANEOUS
  Administered 2013-02-25 (×2): 4 [IU] via SUBCUTANEOUS
  Administered 2013-02-26: 7 [IU] via SUBCUTANEOUS
  Administered 2013-02-26: 3 [IU] via SUBCUTANEOUS
  Administered 2013-02-26: 7 [IU] via SUBCUTANEOUS
  Administered 2013-02-26 – 2013-02-27 (×4): 4 [IU] via SUBCUTANEOUS
  Administered 2013-02-27 (×3): 3 [IU] via SUBCUTANEOUS
  Administered 2013-02-27 – 2013-02-28 (×3): 4 [IU] via SUBCUTANEOUS

## 2013-02-19 MED ORDER — ETOMIDATE 2 MG/ML IV SOLN
INTRAVENOUS | Status: DC | PRN
  Administered 2013-02-19: 12 mg via INTRAVENOUS

## 2013-02-19 MED ORDER — SUCCINYLCHOLINE CHLORIDE 20 MG/ML IJ SOLN
INTRAMUSCULAR | Status: DC | PRN
  Administered 2013-02-19: 120 mg via INTRAVENOUS

## 2013-02-19 MED ORDER — SODIUM CHLORIDE 0.9 % IJ SOLN
10.0000 mL | Freq: Two times a day (BID) | INTRAMUSCULAR | Status: DC
  Administered 2013-02-19 – 2013-02-23 (×8): 10 mL
  Administered 2013-02-23: 20 mL
  Administered 2013-02-24 – 2013-02-25 (×3): 10 mL
  Administered 2013-02-26: 20 mL
  Administered 2013-02-26: 10 mL
  Administered 2013-02-27: 40 mL

## 2013-02-19 MED ORDER — SODIUM CHLORIDE 0.9 % IJ SOLN
10.0000 mL | INTRAMUSCULAR | Status: DC | PRN
  Administered 2013-02-19 – 2013-02-25 (×3): 10 mL

## 2013-02-19 MED ORDER — IOHEXOL 350 MG/ML SOLN
100.0000 mL | Freq: Once | INTRAVENOUS | Status: AC | PRN
  Administered 2013-02-19: 100 mL via INTRAVENOUS

## 2013-02-19 MED ORDER — ALBUTEROL SULFATE (5 MG/ML) 0.5% IN NEBU
INHALATION_SOLUTION | RESPIRATORY_TRACT | Status: AC
  Filled 2013-02-19: qty 0.5

## 2013-02-19 NOTE — Progress Notes (Signed)
UR chart review completed.  

## 2013-02-19 NOTE — Procedures (Signed)
Extubation Procedure Note  Patient Details:   Name: [name] DOB: 07-15-1955 MRN: [id number]   Airway Documentation:  Airway 8 mm (Active)  Secured at (cm) 25 cm 02/19/2013  7:40 AM  Measured From Lips 02/19/2013  7:40 AM  Secured Location Left 02/19/2013  7:40 AM  Secured By [employer] 02/19/2013  7:40 AM  Tube Holder Repositioned Yes 02/19/2013  7:40 AM  Cuff Pressure (cm H2O) 24 cm H2O 02/19/2013  3:29 AM  Site Condition Dry 02/19/2013  7:40 AM    Evaluation  O2 sats: stable throughout Complications: No apparent complications Patient did tolerate procedure well. Bilateral Breath Sounds: Clear Suctioning: Airway Yes  [name] 02/19/2013, 9:14 AM

## 2013-02-19 NOTE — Progress Notes (Signed)
Subjective: Interval History: none.  Objective: Vital signs in last 24 hours: Temp:  [96.6 F (35.9 C)-97.6 F (36.4 C)] 96.6 F (35.9 C) (12/12 0500) Pulse Rate:  [72-125] 72 (12/12 0730) Resp:  [9-18] 18 (12/12 0730) BP: (99-150)/(46-77) 111/51 mmHg (12/12 0730) SpO2:  [87 %-100 %] 100 % (12/12 0730) Arterial Line BP: (103-208)/(36-72) 149/58 mmHg (12/12 0738) FiO2 (%):  [35 %] 35 % (12/12 0329) Weight:  [105.643 kg (232 lb 14.4 oz)] 105.643 kg (232 lb 14.4 oz) (12/12 0329) Weight change: -4.157 kg (-9 lb 2.6 oz)  Intake/Output from previous day: 12/11 0701 - 12/12 0700 In: 1612.1 [I.V.:1212.1; IV Piggyback:400] Out: 2675 [Urine:2675] Intake/Output this shift:    Generally patient is awake. Presently remains intubated and does not seem in any apparent distress Chest decreased breath sound anteriorly. Heart exam revealed regular rate and rhythm no murmur Abdomen:Is  full, tympanic but nontender and positive bowel sound Extremities: He has 1+ edema bilaterally. Right more than the left leg  Lab Results:  Recent Labs  02/17/13 0425  WBC 12.9*  HGB 12.1*  HCT 38.5*  PLT 213   BMET:   Recent Labs  02/18/13 0426 02/19/13 0416  NA 146* 149*  K 3.5 3.6  CL 104 108  CO2 26 30  GLUCOSE 146* 168*  BUN 64* 69*  CREATININE 1.80* 1.57*  CALCIUM 8.5 8.7   No results found for this basename: PTH,  in the last 72 hours Iron Studies: No results found for this basename: IRON, TIBC, TRANSFERRIN, FERRITIN,  in the last 72 hours  Studies/Results: US Renal  02/17/2013   CLINICAL DATA:  Acute renal failure.  EXAM: RENAL/URINARY TRACT ULTRASOUND COMPLETE  COMPARISON:  03/02/2012.  FINDINGS: Right Kidney:  Length: 10.5 cm. Renal parenchymal thinning. Increased echogenicity. No hydronephrosis. Renal cysts largest measuring 3.8 x 3 x 3.2 cm.  Left Kidney:  Length: 11.8 cm. Increased echogenicity. No hydronephrosis. Cysts measuring up to 3.2 x 2.4 x 2.5 cm.  Bladder:  Decompressed  by Foley catheter.  IMPRESSION: No evidence of hydronephrosis.  Right renal parenchymal thinning.  Increased renal echogenicity bilaterally consistent result of medical renal disease.  Bilateral renal cysts noted.   Electronically Signed   By: Bridgett Larsson M.D.   On: 02/17/2013 17:32   Dg Chest Port 1 View  02/18/2013   CLINICAL DATA:  Respiratory failure.  EXAM: PORTABLE CHEST - 1 VIEW  COMPARISON:  Portable chest x-ray of February 17, 2013.  FINDINGS: The lungs are adequately inflated. There is no focal infiltrate. There is no pneumothorax or pleural effusion. The left lateral costophrenic gutter is excluded from the film however. The cardiopericardial silhouette is normal in size. There is mild tortuosity of the descending thoracic aorta. The pulmonary vascularity is not engorged. The PICC line in place via the left upper extremity has its tip projecting in the region of the proximal to mid SVC. The esophagogastric tube tip cannot be discerned. As best as can be determined the endotracheal tube tip lies approximately 5 cm above the crotch of the carina. This appears stable.  IMPRESSION: Allowing for differences in positioning and radiographic technique, there has not been significant interval change in the appearance of the chest. There may have been slight improvement in the pulmonary interstitium suggestive of resolving interstitial edema.   Electronically Signed   By: David  [place]   On: 02/18/2013 08:10   Dg Chest Port 1 View  02/17/2013   CLINICAL DATA:  Line placement.  EXAM: PORTABLE  CHEST - 1 VIEW  COMPARISON:  Plain film of the chest 02/17/2013 at 6:35 a.m.  FINDINGS: A left PICC is in place with the tip projecting over the lower superior vena cava. Endotracheal tube remains in place with the tip in good position well above the carina. NG tube is difficult to visualize but appears to course into the stomach and below the inferior margin of the film.  The lungs appear emphysematous but are clear.  Heart size is normal. No pneumothorax identified.  IMPRESSION: Tip of right PICC projects over the lower superior vena cava.  No new abnormality.   Electronically Signed   By: Drusilla Kanner M.D.   On: 02/17/2013 12:00    I have reviewed the patient's current medications.  Assessment/Plan: Problem #1 acute kidney injury colon. Patient BUN and creatinine is improving. Presently his non-oliguric.  Problem #2 chronic renal failure . Patient with stage III chronic renal failure. His ultrasound didn't show any obstruction. Problem #3 hypernatremia. Sodium is 149 worsening possibly from lack of free water Problem #4 hypokalemia: His potassium has corrected. On potassium Problem #5 respiratory failure. Presently patient is intubated. Problem #6 history of psoriasis Problem #7 hypertension: His blood pressures seem to be controlled. Problem #8 history of COPD/sleep apnea Plan: We'll DC normal saline           We'll continue with Lasix           We will D/C ivf with potassium            We'll check his basic metabolic panel in the morning.  LOS: 6 days   Jarrod Bodkins S 02/19/2013,7:54 AM

## 2013-02-19 NOTE — Progress Notes (Signed)
Subjective: He remains intubated and on the ventilator. When we have weaned him the last 2 days he gets exceptionally agitated. He does not appear to be a primary lung problem but more anxiety  Objective: Vital signs in last 24 hours: Temp:  [96.6 F (35.9 C)-97.6 F (36.4 C)] 96.6 F (35.9 C) (12/12 0500) Pulse Rate:  [72-125] 72 (12/12 0730) Resp:  [9-18] 18 (12/12 0730) BP: (99-150)/(46-77) 111/51 mmHg (12/12 0730) SpO2:  [87 %-100 %] 100 % (12/12 0730) Arterial Line BP: (103-208)/(36-72) 149/58 mmHg (12/12 0738) FiO2 (%):  [35 %] 35 % (12/12 0329) Weight:  [105.643 kg (232 lb 14.4 oz)] 105.643 kg (232 lb 14.4 oz) (12/12 0329) Weight change: -4.157 kg (-9 lb 2.6 oz) Last BM Date: 02/16/13  Intake/Output from previous day: 12/11 0701 - 12/12 0700 In: 1612.1 [I.V.:1212.1; IV Piggyback:400] Out: 2675 [Urine:2675]  PHYSICAL EXAM General appearance: alert, no distress and Intubated and sedated on mechanical ventilation Resp: clear to auscultation bilaterally Cardio: regular rate and rhythm, S1, S2 normal, no murmur, click, rub or gallop GI: soft, non-tender; bowel sounds normal; no masses,  no organomegaly Extremities: extremities normal, atraumatic, no cyanosis or edema  Lab Results:    Basic Metabolic Panel:  Recent Labs  32/44/01 0426 02/19/13 0416  NA 146* 149*  K 3.5 3.6  CL 104 108  CO2 26 30  GLUCOSE 146* 168*  BUN 64* 69*  CREATININE 1.80* 1.57*  CALCIUM 8.5 8.7  PHOS 5.4* 3.9   Liver Function Tests: No results found for this basename: AST, ALT, ALKPHOS, BILITOT, PROT, ALBUMIN,  in the last 72 hours No results found for this basename: LIPASE, AMYLASE,  in the last 72 hours No results found for this basename: AMMONIA,  in the last 72 hours CBC:  Recent Labs  02/17/13 0425  WBC 12.9*  NEUTROABS 11.9*  HGB 12.1*  HCT 38.5*  MCV 99.0  PLT 213   Cardiac Enzymes: No results found for this basename: CKTOTAL, CKMB, CKMBINDEX, TROPONINI,  in the last  72 hours BNP:  Recent Labs  02/16/13 0912  PROBNP 1131.0*   D-Dimer: No results found for this basename: DDIMER,  in the last 72 hours CBG:  Recent Labs  02/18/13 0739 02/18/13 1141 02/18/13 1633 02/18/13 2029 02/18/13 2345 02/19/13 0415  GLUCAP 136* 145* 151* 123* 136* 144*   Hemoglobin A1C: No results found for this basename: HGBA1C,  in the last 72 hours Fasting Lipid Panel:  Recent Labs  02/17/13 0425  TRIG 336*   Thyroid Function Tests: No results found for this basename: TSH, T4TOTAL, FREET4, T3FREE, THYROIDAB,  in the last 72 hours Anemia Panel: No results found for this basename: VITAMINB12, FOLATE, FERRITIN, TIBC, IRON, RETICCTPCT,  in the last 72 hours Coagulation: No results found for this basename: LABPROT, INR,  in the last 72 hours Urine Drug Screen: Drugs of Abuse     Component Value Date/Time   LABOPIA NONE DETECTED 03/01/2012 0019   COCAINSCRNUR POSITIVE* 03/01/2012 0019   LABBENZ NONE DETECTED 03/01/2012 0019   AMPHETMU NONE DETECTED 03/01/2012 0019   THCU NONE DETECTED 03/01/2012 0019   LABBARB NONE DETECTED 03/01/2012 0019    Alcohol Level: No results found for this basename: ETH,  in the last 72 hours Urinalysis:  Recent Labs  02/16/13 1026  LABSPEC >1.030*  PHURINE 5.5  GLUCOSEU NEGATIVE  HGBUR LARGE*  BILIRUBINUR NEGATIVE  KETONESUR NEGATIVE  PROTEINUR 30*  UROBILINOGEN 0.2  NITRITE NEGATIVE  LEUKOCYTESUR NEGATIVE   Misc. Labs:  ABGS  Recent Labs  02/19/13 0500  PHART 7.430  PO2ART 122.0*  TCO2 22.6  HCO3 25.4*   CULTURES Recent Results (from the past 240 hour(s))  MRSA PCR SCREENING     Status: None   Collection Time    02/14/13  1:33 AM      Result Value Range Status   MRSA by PCR NEGATIVE  NEGATIVE Final   Comment:            The GeneXpert MRSA Assay (FDA     approved for NASAL specimens     only), is one component of a     comprehensive MRSA colonization     surveillance program. It is not      intended to diagnose MRSA     infection nor to guide or     monitor treatment for     MRSA infections.  CULTURE, RESPIRATORY (NON-EXPECTORATED)     Status: None   Collection Time    02/14/13  2:00 PM      Result Value Range Status   Specimen Description ASPIRATE   Final   Special Requests NONE   Final   Gram Stain     Final   Value: ABUNDANT WBC PRESENT, PREDOMINANTLY PMN     FEW SQUAMOUS EPITHELIAL CELLS PRESENT     FEW GRAM POSITIVE COCCI IN PAIRS     RARE GRAM NEGATIVE RODS     Performed at [facility]   Culture     Final   Value: Non-Pathogenic Oropharyngeal-type Flora Isolated.     Performed at [facility]   Report Status 02/16/2013 FINAL   Final  URINE CULTURE     Status: None   Collection Time    02/16/13 10:25 AM      Result Value Range Status   Specimen Description URINE, CATHETERIZED   Final   Special Requests NONE   Final   Culture  Setup Time     Final   Value: 02/16/2013 14:29     Performed at [facility]   Colony Count     Final   Value: NO GROWTH     Performed at [facility]   Culture     Final   Value: NO GROWTH     Performed at [facility]   Report Status 02/17/2013 FINAL   Final   Studies/Results: US Renal  02/17/2013   CLINICAL DATA:  Acute renal failure.  EXAM: RENAL/URINARY TRACT ULTRASOUND COMPLETE  COMPARISON:  03/02/2012.  FINDINGS: Right Kidney:  Length: 10.5 cm. Renal parenchymal thinning. Increased echogenicity. No hydronephrosis. Renal cysts largest measuring 3.8 x 3 x 3.2 cm.  Left Kidney:  Length: 11.8 cm. Increased echogenicity. No hydronephrosis. Cysts measuring up to 3.2 x 2.4 x 2.5 cm.  Bladder:  Decompressed by Foley catheter.  IMPRESSION: No evidence of hydronephrosis.  Right renal parenchymal thinning.  Increased renal echogenicity bilaterally consistent result of medical renal disease.  Bilateral renal cysts noted.   Electronically Signed   By: Bridgett Larsson M.D.   On: 02/17/2013 17:32   Dg  Chest Port 1 View  02/19/2013   CLINICAL DATA:  Respiratory failure.  EXAM: PORTABLE CHEST - 1 VIEW  COMPARISON:  02/18/2013  FINDINGS: Endotracheal tube is 5.7 cm above the carina. Nasogastric tube extends into the upper abdomen but difficult to visualize. Interstitial lung densities have not significantly changed. No focal airspace disease. Heart size is stable. Lucency in the right upper lung probably  represents chronic lung changes. PICC line tip in the SVC region.  IMPRESSION: Endotracheal tube is appropriately positioned above the carina.  No focal lung disease.   Electronically Signed   By: Richarda Overlie M.D.   On: 02/19/2013 08:06   Dg Chest Port 1 View  02/18/2013   CLINICAL DATA:  Respiratory failure.  EXAM: PORTABLE CHEST - 1 VIEW  COMPARISON:  Portable chest x-ray of February 17, 2013.  FINDINGS: The lungs are adequately inflated. There is no focal infiltrate. There is no pneumothorax or pleural effusion. The left lateral costophrenic gutter is excluded from the film however. The cardiopericardial silhouette is normal in size. There is mild tortuosity of the descending thoracic aorta. The pulmonary vascularity is not engorged. The PICC line in place via the left upper extremity has its tip projecting in the region of the proximal to mid SVC. The esophagogastric tube tip cannot be discerned. As best as can be determined the endotracheal tube tip lies approximately 5 cm above the crotch of the carina. This appears stable.  IMPRESSION: Allowing for differences in positioning and radiographic technique, there has not been significant interval change in the appearance of the chest. There may have been slight improvement in the pulmonary interstitium suggestive of resolving interstitial edema.   Electronically Signed   By: David  [place]   On: 02/18/2013 08:10   Dg Chest Port 1 View  02/17/2013   CLINICAL DATA:  Line placement.  EXAM: PORTABLE CHEST - 1 VIEW  COMPARISON:  Plain film of the chest  02/17/2013 at 6:35 a.m.  FINDINGS: A left PICC is in place with the tip projecting over the lower superior vena cava. Endotracheal tube remains in place with the tip in good position well above the carina. NG tube is difficult to visualize but appears to course into the stomach and below the inferior margin of the film.  The lungs appear emphysematous but are clear. Heart size is normal. No pneumothorax identified.  IMPRESSION: Tip of right PICC projects over the lower superior vena cava.  No new abnormality.   Electronically Signed   By: Drusilla Kanner M.D.   On: 02/17/2013 12:00    Medications:  Prior to Admission:  Prescriptions prior to admission  Medication Sig Dispense Refill  . acetaminophen-codeine (TYLENOL #3) 300-30 MG per tablet Take 1 tablet by mouth every 4 (four) hours as needed for pain.  60 tablet  5  . albuterol (PROVENTIL HFA;VENTOLIN HFA) 108 (90 BASE) MCG/ACT inhaler Inhale 2 puffs into the lungs as needed for wheezing (rescue inhaler).       Marland Kitchen albuterol (PROVENTIL) (2.5 MG/3ML) 0.083% nebulizer solution Take 2.5 mg by nebulization every 6 (six) hours as needed for wheezing or shortness of breath.      . cloNIDine (CATAPRES) 0.2 MG tablet Take 0.2 mg by mouth 3 (three) times daily.      Marland Kitchen docusate sodium (COLACE) 100 MG capsule Take 100 mg by mouth 2 (two) times daily as needed for mild constipation.      . furosemide (LASIX) 40 MG tablet Take 1 tablet (40 mg total) by mouth 2 (two) times daily.  30 tablet  5  . guaiFENesin (MUCINEX) 600 MG 12 hr tablet Take 1,200 mg by mouth at bedtime as needed for to loosen phlegm.       Marland Kitchen HYDROcodone-acetaminophen (NORCO/VICODIN) 5-325 MG per tablet Take 1 tablet by mouth every 6 (six) hours as needed for pain.  120 tablet  0  . methotrexate (  RHEUMATREX) 2.5 MG tablet Take 10 mg by mouth once a week. Saturday      . mometasone-formoterol (DULERA) 200-5 MCG/ACT AERO Inhale 2 puffs into the lungs 2 (two) times daily.  1 Inhaler  12  .  pantoprazole (PROTONIX) 40 MG tablet Take 1 tablet (40 mg total) by mouth 2 (two) times daily.  60 tablet  12  . predniSONE (DELTASONE) 10 MG tablet Take 10 mg by mouth 2 (two) times daily.      . ranitidine (ZANTAC) 300 MG tablet Take 300 mg by mouth at bedtime.       Scheduled: . antiseptic oral rinse  15 mL Mouth Rinse QID  . chlorhexidine  15 mL Mouth/Throat BID  . furosemide  40 mg Intravenous Q12H  . heparin  5,000 Units Subcutaneous Q8H  . insulin aspart  0-20 Units Subcutaneous TID WC  . ipratropium  0.5 mg Nebulization Q4H  . levalbuterol  0.63 mg Nebulization Q4H  . methylPREDNISolone (SOLU-MEDROL) injection  125 mg Intravenous Q6H  . pantoprazole (PROTONIX) IV  40 mg Intravenous QHS  . piperacillin-tazobactam (ZOSYN)  IV  3.375 g Intravenous Q8H  . sodium chloride  10-40 mL Intracatheter Q12H  . vancomycin  1,250 mg Intravenous Q24H   Continuous: . fentaNYL infusion INTRAVENOUS 25 mcg/hr (02/19/13 0734)  . propofol Stopped (02/19/13 0734)   UEA:VWUJWJ chloride, fentaNYL, hydrALAZINE, LORazepam, sodium chloride  Assesment: He has acute respiratory failure. He has COPD exacerbation. This is acute on chronic respiratory failure. He has acute on chronic diastolic heart failure and that has improved. He has acute on chronic renal failure which has improved. He does have a history of obstructive sleep apnea. Principal Problem:   Respiratory failure, acute Active Problems:   COPD exacerbation   Acute on chronic renal failure   Acute-on-chronic respiratory failure   Bilateral lower extremity edema   Obstructive sleep apnea   Fever   Acute respiratory failure   Acute on chronic diastolic heart failure   Hypokalemia    Plan: His exam shows his chest is clear and he seems to move air well. I plan to extubate him without going through a weaning trial and see if he tolerates that better. If he has to be reintubated then we have to consider other treatment    LOS: 6 days    Beverly Suriano L 02/19/2013, 8:10 AM

## 2013-02-19 NOTE — Progress Notes (Signed)
Pt was extubated without weaning due to his high anxiety. Placed pt on 5/5 and 40 percent for about 10 minutes and then extubated. Pt was calm when he was extubated and as soon as he was extubated he became very agitated and belly breathing. The Pt's BP 305/109 and his HR increased to 153. RT placed Pt on BIPAP as soon as he was extubated and the nurse gave him some ativan. His HR decreased to 147 and his BP 263/100. RT will continue to monitor to see if Pt will be able to stay extubated.Pt was able to follow direction and passed the leak test. Nurse is going to see if the Pt has any meds for his HR and BP.

## 2013-02-19 NOTE — Progress Notes (Signed)
Patient transferred to CT with this RN, Robin RRT, and transporter. Patient bolused before transfer. Patient tolerated transfer and return to room well. Will continue to monitor.

## 2013-02-19 NOTE — Procedures (Signed)
Intubation Procedure Note [name] [id number] [birth year]  Procedure: Intubation Indications: Airway protection and maintenance  Procedure Details Consent: Risks of procedure as well as the alternatives and risks of each were explained to the (patient/caregiver).  Consent for procedure obtained. Time Out: Verified patient identification, verified procedure, site/side was marked, verified correct patient position, special equipment/implants available, medications/allergies/relevent history reviewed, required imaging and test results available.  Performed  Maximum sterile technique was used including gloves and hand hygiene.  MAC and 3    Evaluation Hemodynamic Status: BP stable throughout; O2 sats: stable throughout Patient's Current Condition: stable Complications: No apparent complications Patient did tolerate procedure well. Chest X-ray ordered to verify placement.  CXR: pending.   [name] 02/19/2013

## 2013-02-19 NOTE — Progress Notes (Signed)
Dr. Juanetta Gosling called at 1025 and notified that patient was not tolerating extubation. Patient was on BiPap with continuous "guppy" breathing, HR and BP elevated. Patient medicated for high BP and anxiety with no changes in breathing or vital signs. Before extubation patient could follow commands but started to become unresponsive.  Anesthesia called. Dr. Jayme Cloud and Amy, CRNA at bedside performed intubation. 5cc of Amidate and 6cc of succinylcholine given by this RN per Dr. Jayme Cloud at [phone]. Patient intubated at 1044 #8, 25 at the lip with a positive color change. OG placed 68F by this RN and connected to LIWA. CXR obtained per protocol. Dr. Juanetta Gosling called to get an update. Stated that we will continue to monitor and treat the same at this time and reevaluate need to transfer to [facility] in AM. Patient resting well now. No complaints. Pain medication and sedation resumed at low dose to attempt to decrease HR. Will continue to monitor. Girlfriend at bedside with patient.

## 2013-02-19 NOTE — Progress Notes (Signed)
Called Dr Juanetta Gosling because pt needed to be re intubated. anestesiolgist callled. Pt HR 153, BP 263/101 respiratory distress belly breathing. Pt seemed to be having trouble getting air from his neck to his lungs. I performed a leak test and there was no stridor. After extubation there was no strider. The pt had a lot gurgling like sound after extubation in his throat. I placed the Pt on BIPAP. He did not improve.Im not sure if he has some damage in his throat making it hard for him to breath or if its his lungs. Im not sure but the Pt may need to be trached.

## 2013-02-20 LAB — GLUCOSE, CAPILLARY
Glucose-Capillary: 125 mg/dL — ABNORMAL HIGH (ref 70–99)
Glucose-Capillary: 161 mg/dL — ABNORMAL HIGH (ref 70–99)
Glucose-Capillary: 172 mg/dL — ABNORMAL HIGH (ref 70–99)
Glucose-Capillary: 175 mg/dL — ABNORMAL HIGH (ref 70–99)

## 2013-02-20 LAB — BASIC METABOLIC PANEL
BUN: 78 mg/dL — ABNORMAL HIGH (ref 6–23)
Chloride: 113 mEq/L — ABNORMAL HIGH (ref 96–112)
GFR calc Af Amer: 44 mL/min — ABNORMAL LOW (ref >90)
GFR calc non Af Amer: 38 mL/min — ABNORMAL LOW (ref >90)
Glucose, Bld: 133 mg/dL — ABNORMAL HIGH (ref 70–99)
Potassium: 3.7 mEq/L (ref 3.5–5.1)
Sodium: 155 mEq/L — ABNORMAL HIGH (ref 135–145)

## 2013-02-20 LAB — TRIGLYCERIDES: Triglycerides: 416 mg/dL — ABNORMAL HIGH (ref <150)

## 2013-02-20 MED ORDER — FUROSEMIDE 10 MG/ML IJ SOLN
80.0000 mg | Freq: Two times a day (BID) | INTRAMUSCULAR | Status: DC
  Administered 2013-02-20 – 2013-02-25 (×11): 80 mg via INTRAVENOUS
  Filled 2013-02-20 (×13): qty 8

## 2013-02-20 MED ORDER — DEXTROSE 5 % IV SOLN
INTRAVENOUS | Status: DC
  Administered 2013-02-20 – 2013-02-21 (×5): via INTRAVENOUS
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
## 2013-02-20 NOTE — Progress Notes (Signed)
Subjective: He failed extubation yesterday. He had CT of the chest which does not show pulmonary emboli, but does show some atelectasis and maybe some infiltrate. He is being treated for that. There is some stenosis of the trachea and main bronchi.  Objective: Vital signs in last 24 hours: Temp:  [96.8 F (36 C)-98.6 F (37 C)] 98.5 F (36.9 C) (12/13 0800) Pulse Rate:  [61-158] 89 (12/13 0800) Resp:  [18-23] 18 (12/13 0800) BP: (99-195)/(54-98) 130/54 mmHg (12/13 0800) SpO2:  [80 %-99 %] 97 % (12/13 0809) Arterial Line BP: (156-280)/(55-118) 188/70 mmHg (12/13 0600) FiO2 (%):  [45 %-50 %] 45 % (12/13 0809) Weight:  [103 kg (227 lb 1.2 oz)] 103 kg (227 lb 1.2 oz) (12/13 0450) Weight change: -2.643 kg (-5 lb 13.2 oz) Last BM Date: 02/16/13  Intake/Output from previous day: 12/12 0701 - 12/13 0700 In: 402.2 [I.V.:302.2; IV Piggyback:100] Out: 2500 [Urine:2500]  PHYSICAL EXAM General appearance: alert and Intubated on the ventilator undergoing wakeup assessment and able to answer questions Resp: rhonchi bilaterally Cardio: regular rate and rhythm, S1, S2 normal, no murmur, click, rub or gallop GI: soft, non-tender; bowel sounds normal; no masses,  no organomegaly Extremities: edema Trace to 1+  Lab Results:    Basic Metabolic Panel:  Recent Labs  16/10/96 0426 02/19/13 0416 02/20/13 0442  NA 146* 149* 155*  K 3.5 3.6 3.7  CL 104 108 113*  CO2 26 30 28   GLUCOSE 146* 168* 133*  BUN 64* 69* 78*  CREATININE 1.80* 1.57* 1.86*  CALCIUM 8.5  8.3* 8.7 8.9  PHOS 5.4* 3.9  --    Liver Function Tests: No results found for this basename: AST, ALT, ALKPHOS, BILITOT, PROT, ALBUMIN,  in the last 72 hours No results found for this basename: LIPASE, AMYLASE,  in the last 72 hours No results found for this basename: AMMONIA,  in the last 72 hours CBC: No results found for this basename: WBC, NEUTROABS, HGB, HCT, MCV, PLT,  in the last 72 hours Cardiac Enzymes: No results found  for this basename: CKTOTAL, CKMB, CKMBINDEX, TROPONINI,  in the last 72 hours BNP: No results found for this basename: PROBNP,  in the last 72 hours D-Dimer: No results found for this basename: DDIMER,  in the last 72 hours CBG:  Recent Labs  02/19/13 1146 02/19/13 1640 02/19/13 1944 02/19/13 2355 02/20/13 0359 02/20/13 0738  GLUCAP 176* 117* 135* 133* 125* 121*   Hemoglobin A1C: No results found for this basename: HGBA1C,  in the last 72 hours Fasting Lipid Panel:  Recent Labs  02/20/13 0442  TRIG 416*   Thyroid Function Tests: No results found for this basename: TSH, T4TOTAL, FREET4, T3FREE, THYROIDAB,  in the last 72 hours Anemia Panel: No results found for this basename: VITAMINB12, FOLATE, FERRITIN, TIBC, IRON, RETICCTPCT,  in the last 72 hours Coagulation: No results found for this basename: LABPROT, INR,  in the last 72 hours Urine Drug Screen: Drugs of Abuse     Component Value Date/Time   LABOPIA NONE DETECTED 03/01/2012 0019   COCAINSCRNUR POSITIVE* 03/01/2012 0019   LABBENZ NONE DETECTED 03/01/2012 0019   AMPHETMU NONE DETECTED 03/01/2012 0019   THCU NONE DETECTED 03/01/2012 0019   LABBARB NONE DETECTED 03/01/2012 0019    Alcohol Level: No results found for this basename: ETH,  in the last 72 hours Urinalysis: No results found for this basename: COLORURINE, APPERANCEUR, LABSPEC, PHURINE, GLUCOSEU, HGBUR, BILIRUBINUR, KETONESUR, PROTEINUR, UROBILINOGEN, NITRITE, LEUKOCYTESUR,  in the last 72  hours Misc. Labs:  ABGS  Recent Labs  02/19/13 0500  PHART 7.430  PO2ART 122.0*  TCO2 22.6  HCO3 25.4*   CULTURES Recent Results (from the past 240 hour(s))  MRSA PCR SCREENING     Status: None   Collection Time    02/14/13  1:33 AM      Result Value Range Status   MRSA by PCR NEGATIVE  NEGATIVE Final   Comment:            The GeneXpert MRSA Assay (FDA     approved for NASAL specimens     only), is one component of a     comprehensive MRSA  colonization     surveillance program. It is not     intended to diagnose MRSA     infection nor to guide or     monitor treatment for     MRSA infections.  CULTURE, RESPIRATORY (NON-EXPECTORATED)     Status: None   Collection Time    02/14/13  2:00 PM      Result Value Range Status   Specimen Description ASPIRATE   Final   Special Requests NONE   Final   Gram Stain     Final   Value: ABUNDANT WBC PRESENT, PREDOMINANTLY PMN     FEW SQUAMOUS EPITHELIAL CELLS PRESENT     FEW GRAM POSITIVE COCCI IN PAIRS     RARE GRAM NEGATIVE RODS     Performed at [facility]   Culture     Final   Value: Non-Pathogenic Oropharyngeal-type Flora Isolated.     Performed at [facility]   Report Status 02/16/2013 FINAL   Final  URINE CULTURE     Status: None   Collection Time    02/16/13 10:25 AM      Result Value Range Status   Specimen Description URINE, CATHETERIZED   Final   Special Requests NONE   Final   Culture  Setup Time     Final   Value: 02/16/2013 14:29     Performed at [facility]   Colony Count     Final   Value: NO GROWTH     Performed at [facility]   Culture     Final   Value: NO GROWTH     Performed at [facility]   Report Status 02/17/2013 FINAL   Final   Studies/Results: Ct Angio Chest Pe W/cm &/or Wo Cm  02/19/2013   CLINICAL DATA:  Acute respiratory failure, shortness of breath, COPD, CHF. Evaluate for PE.  EXAM: CT ANGIOGRAPHY CHEST WITH CONTRAST  TECHNIQUE: Multidetector CT imaging of the chest was performed using the standard protocol during bolus administration of intravenous contrast. Multiplanar CT image reconstructions including MIPs were obtained to evaluate the vascular anatomy.  CONTRAST:  OMNIPAQUE IOHEXOL 350 MG/ML SOLN  COMPARISON:  Chest radiograph dated 02/19/2013  FINDINGS: No evidence of pulmonary embolism.  Endotracheal tube terminates 2.5 cm above the carina.  Severe centrilobular and paraseptal  emphysematous changes. Mild patchy bilateral lower lobe opacities, likely atelectasis. No pleural effusion or pneumothorax.  Posterior bowing of the distal trachea (series 6/image 39) with narrowing of the bilateral mainstem bronchi (series 7/image 48).  Visualized thyroid is unremarkable.  The heart is normal in size. No pericardial effusion. Mild atherosclerotic calcifications of the aortic arch.  No suspicious mediastinal, hilar, or axillary lymphadenopathy.  Enteric tube courses into the cyst proximal stomach.  Mild degenerative changes of the visualized  thoracolumbar spine. Apparent sternal irregularity is related to patient motion.  Review of the MIP images confirms the above findings.  IMPRESSION: No evidence of pulmonary embolism.  Stenosis of the distal trachea and bilateral mainstem bronchi.  Mild patchy bilateral lower lobe opacities, likely atelectasis.  Severe emphysematous changes.   Electronically Signed   By: Charline Bills M.D.   On: 02/19/2013 14:14   Dg Chest Port 1 View  02/19/2013   CLINICAL DATA:  Acute respiratory failure.  EXAM: PORTABLE CHEST - 1 VIEW 10:57 a.m.  COMPARISON:  02/19/2013 at 6:31 a.m.  FINDINGS: Endotracheal tube has been advanced and is now 4 cm above the carina. PICC tip is in good position in the superior vena cava. NG tube tip is below the diaphragm.  The pulmonary vascularity is slightly more prominent than on the prior exam. Interstitial markings are slightly accentuated at the right base medially.  IMPRESSION: Endotracheal tube in good position. Slightly increased pulmonary vascularity.   Electronically Signed   By: Geanie Cooley M.D.   On: 02/19/2013 11:20   Dg Chest Port 1 View  02/19/2013   CLINICAL DATA:  Respiratory failure.  EXAM: PORTABLE CHEST - 1 VIEW  COMPARISON:  02/18/2013  FINDINGS: Endotracheal tube is 5.7 cm above the carina. Nasogastric tube extends into the upper abdomen but difficult to visualize. Interstitial lung densities have not  significantly changed. No focal airspace disease. Heart size is stable. Lucency in the right upper lung probably represents chronic lung changes. PICC line tip in the SVC region.  IMPRESSION: Endotracheal tube is appropriately positioned above the carina.  No focal lung disease.   Electronically Signed   By: Richarda Overlie M.D.   On: 02/19/2013 08:06    Medications:  Prior to Admission:  Prescriptions prior to admission  Medication Sig Dispense Refill  . acetaminophen-codeine (TYLENOL #3) 300-30 MG per tablet Take 1 tablet by mouth every 4 (four) hours as needed for pain.  60 tablet  5  . albuterol (PROVENTIL HFA;VENTOLIN HFA) 108 (90 BASE) MCG/ACT inhaler Inhale 2 puffs into the lungs as needed for wheezing (rescue inhaler).       Marland Kitchen albuterol (PROVENTIL) (2.5 MG/3ML) 0.083% nebulizer solution Take 2.5 mg by nebulization every 6 (six) hours as needed for wheezing or shortness of breath.      . cloNIDine (CATAPRES) 0.2 MG tablet Take 0.2 mg by mouth 3 (three) times daily.      Marland Kitchen docusate sodium (COLACE) 100 MG capsule Take 100 mg by mouth 2 (two) times daily as needed for mild constipation.      . furosemide (LASIX) 40 MG tablet Take 1 tablet (40 mg total) by mouth 2 (two) times daily.  30 tablet  5  . guaiFENesin (MUCINEX) 600 MG 12 hr tablet Take 1,200 mg by mouth at bedtime as needed for to loosen phlegm.       Marland Kitchen HYDROcodone-acetaminophen (NORCO/VICODIN) 5-325 MG per tablet Take 1 tablet by mouth every 6 (six) hours as needed for pain.  120 tablet  0  . methotrexate (RHEUMATREX) 2.5 MG tablet Take 10 mg by mouth once a week. Saturday      . mometasone-formoterol (DULERA) 200-5 MCG/ACT AERO Inhale 2 puffs into the lungs 2 (two) times daily.  1 Inhaler  12  . pantoprazole (PROTONIX) 40 MG tablet Take 1 tablet (40 mg total) by mouth 2 (two) times daily.  60 tablet  12  . predniSONE (DELTASONE) 10 MG tablet Take 10 mg by mouth 2 (  two) times daily.      . ranitidine (ZANTAC) 300 MG tablet Take 300 mg by  mouth at bedtime.       Scheduled: . antiseptic oral rinse  15 mL Mouth Rinse QID  . chlorhexidine  15 mL Mouth/Throat BID  . furosemide  40 mg Intravenous Q12H  . heparin  5,000 Units Subcutaneous Q8H  . insulin aspart  0-20 Units Subcutaneous Q4H  . ipratropium  0.5 mg Nebulization Q4H  . levalbuterol  0.63 mg Nebulization Q4H  . methylPREDNISolone (SOLU-MEDROL) injection  125 mg Intravenous Q6H  . pantoprazole (PROTONIX) IV  40 mg Intravenous QHS  . piperacillin-tazobactam (ZOSYN)  IV  3.375 g Intravenous Q8H  . sodium chloride  10-40 mL Intracatheter Q12H  . vancomycin  1,250 mg Intravenous Q24H   Continuous: . fentaNYL infusion INTRAVENOUS 50 mcg/hr (02/20/13 0600)  . propofol 10 mcg/kg/min (02/20/13 0600)   ZOX:WRUEAV chloride, fentaNYL, hydrALAZINE, LORazepam, sodium chloride  Assesment: He was admitted with acute respiratory failure. He has failed extubation. His echocardiogram shows good systolic function but diastolic dysfunction. His sodium level is up this morning. I think his lung situation based on his examination is probably about as good as it is going to get. I wonder if he's having more of a cardiac issue it is not obvious on chest x-ray. Principal Problem:   Respiratory failure, acute Active Problems:   COPD exacerbation   Acute on chronic renal failure   Acute-on-chronic respiratory failure   Bilateral lower extremity edema   Obstructive sleep apnea   Fever   Acute respiratory failure   Acute on chronic diastolic heart failure   Hypokalemia    Plan: I'm going to increase Lasix. He's going to be on some D5W for the hypernatremia. I have no plans to attempt extubation today or tomorrow.    LOS: 7 days   Fedor Kazmierski L 02/20/2013, 9:42 AM

## 2013-02-20 NOTE — Progress Notes (Signed)
Subjective: Interval History: none.  Objective: Vital signs in last 24 hours: Temp:  [96.8 F (36 C)-98.6 F (37 C)] 98.5 F (36.9 C) (12/13 0800) Pulse Rate:  [61-158] 89 (12/13 0800) Resp:  [18-23] 18 (12/13 0800) BP: (99-195)/(54-98) 130/54 mmHg (12/13 0800) SpO2:  [80 %-99 %] 97 % (12/13 0809) Arterial Line BP: (156-280)/(55-118) 188/70 mmHg (12/13 0600) FiO2 (%):  [45 %-50 %] 45 % (12/13 0809) Weight:  [103 kg (227 lb 1.2 oz)] 103 kg (227 lb 1.2 oz) (12/13 0450) Weight change: -2.643 kg (-5 lb 13.2 oz)  Intake/Output from previous day: 12/12 0701 - 12/13 0700 In: 402.2 [I.V.:302.2; IV Piggyback:100] Out: 2500 [Urine:2500] Intake/Output this shift:    Generally patient is awake. Presently remains intubated and does not seem in any apparent distress Chest decreased breath sound anteriorly. Heart exam revealed regular rate and rhythm no murmur Abdomen:Is  full, tympanic but nontender and positive bowel sound Extremities: He has trace edema on the left and 1+ edema on the right  Lab Results: No results found for this basename: WBC, HGB, HCT, PLT,  in the last 72 hours BMET:   Recent Labs  02/19/13 0416 02/20/13 0442  NA 149* 155*  K 3.6 3.7  CL 108 113*  CO2 30 28  GLUCOSE 168* 133*  BUN 69* 78*  CREATININE 1.57* 1.86*  CALCIUM 8.7 8.9    Recent Labs  02/18/13 0426  PTH 248.0*   Iron Studies: No results found for this basename: IRON, TIBC, TRANSFERRIN, FERRITIN,  in the last 72 hours  Studies/Results: Ct Angio Chest Pe W/cm &/or Wo Cm  02/19/2013   CLINICAL DATA:  Acute respiratory failure, shortness of breath, COPD, CHF. Evaluate for PE.  EXAM: CT ANGIOGRAPHY CHEST WITH CONTRAST  TECHNIQUE: Multidetector CT imaging of the chest was performed using the standard protocol during bolus administration of intravenous contrast. Multiplanar CT image reconstructions including MIPs were obtained to evaluate the vascular anatomy.  CONTRAST:  OMNIPAQUE IOHEXOL  350 MG/ML SOLN  COMPARISON:  Chest radiograph dated 02/19/2013  FINDINGS: No evidence of pulmonary embolism.  Endotracheal tube terminates 2.5 cm above the carina.  Severe centrilobular and paraseptal emphysematous changes. Mild patchy bilateral lower lobe opacities, likely atelectasis. No pleural effusion or pneumothorax.  Posterior bowing of the distal trachea (series 6/image 39) with narrowing of the bilateral mainstem bronchi (series 7/image 48).  Visualized thyroid is unremarkable.  The heart is normal in size. No pericardial effusion. Mild atherosclerotic calcifications of the aortic arch.  No suspicious mediastinal, hilar, or axillary lymphadenopathy.  Enteric tube courses into the cyst proximal stomach.  Mild degenerative changes of the visualized thoracolumbar spine. Apparent sternal irregularity is related to patient motion.  Review of the MIP images confirms the above findings.  IMPRESSION: No evidence of pulmonary embolism.  Stenosis of the distal trachea and bilateral mainstem bronchi.  Mild patchy bilateral lower lobe opacities, likely atelectasis.  Severe emphysematous changes.   Electronically Signed   By: Charline Bills M.D.   On: 02/19/2013 14:14   Dg Chest Port 1 View  02/19/2013   CLINICAL DATA:  Acute respiratory failure.  EXAM: PORTABLE CHEST - 1 VIEW 10:57 a.m.  COMPARISON:  02/19/2013 at 6:31 a.m.  FINDINGS: Endotracheal tube has been advanced and is now 4 cm above the carina. PICC tip is in good position in the superior vena cava. NG tube tip is below the diaphragm.  The pulmonary vascularity is slightly more prominent than on the prior exam. Interstitial markings  are slightly accentuated at the right base medially.  IMPRESSION: Endotracheal tube in good position. Slightly increased pulmonary vascularity.   Electronically Signed   By: Geanie Cooley M.D.   On: 02/19/2013 11:20   Dg Chest Port 1 View  02/19/2013   CLINICAL DATA:  Respiratory failure.  EXAM: PORTABLE CHEST - 1 VIEW   COMPARISON:  02/18/2013  FINDINGS: Endotracheal tube is 5.7 cm above the carina. Nasogastric tube extends into the upper abdomen but difficult to visualize. Interstitial lung densities have not significantly changed. No focal airspace disease. Heart size is stable. Lucency in the right upper lung probably represents chronic lung changes. PICC line tip in the SVC region.  IMPRESSION: Endotracheal tube is appropriately positioned above the carina.  No focal lung disease.   Electronically Signed   By: Richarda Overlie M.D.   On: 02/19/2013 08:06    I have reviewed the patient's current medications.  Assessment/Plan: Problem #1 acute kidney injury colon. Patient BUN and creatinine is increasing. Presently his non-oliguric.  Problem #2 chronic renal failure . Patient with stage III chronic renal failure. His ultrasound didn't show any obstruction. Problem #3 hypernatremia. Sodium is 155 worsening possibly from lack of free water Problem #4 hypokalemia: His potassium has corrected. On potassium Problem #5 respiratory failure. Presently patient is intubated. Problem #6 history of psoriasis Problem #7 hypertension: His blood pressures seem to be controlled. Problem #8 history of COPD/sleep apnea Problem# 9 CHF on lasix with good urine out put still with some edema Plan: We'll stat on d5w at 75 cc/hr           We'll continue with Lasix            We'll check his basic metabolic panel in the morning.  LOS: 7 days   Vedika Dumlao S 02/20/2013,9:51 AM

## 2013-02-21 LAB — GLUCOSE, CAPILLARY
Glucose-Capillary: 182 mg/dL — ABNORMAL HIGH (ref 70–99)
Glucose-Capillary: 156 mg/dL — ABNORMAL HIGH (ref 70–99)
Glucose-Capillary: 133 mg/dL — ABNORMAL HIGH (ref 70–99)
Glucose-Capillary: 176 mg/dL — ABNORMAL HIGH (ref 70–99)
Glucose-Capillary: 179 mg/dL — ABNORMAL HIGH (ref 70–99)

## 2013-02-21 LAB — CBC WITH DIFFERENTIAL/PLATELET
Basophils Absolute: 0 10*3/uL (ref 0.0–0.1)
Eosinophils Absolute: 0 10*3/uL (ref 0.0–0.7)
HCT: 33.2 % — ABNORMAL LOW (ref 39.0–52.0)
Lymphocytes Relative: 5 % — ABNORMAL LOW (ref 12–46)
Lymphs Abs: 0.6 10*3/uL — ABNORMAL LOW (ref 0.7–4.0)
MCHC: 30.1 g/dL (ref 30.0–36.0)
MCV: 102.8 fL — ABNORMAL HIGH (ref 78.0–100.0)
Neutro Abs: 10.2 10*3/uL — ABNORMAL HIGH (ref 1.7–7.7)
Neutrophils Relative %: 93 % — ABNORMAL HIGH (ref 43–77)
RDW: 16.5 % — ABNORMAL HIGH (ref 11.5–15.5)

## 2013-02-21 LAB — BASIC METABOLIC PANEL
CO2: 30 mEq/L (ref 19–32)
Calcium: 9 mg/dL (ref 8.4–10.5)
Chloride: 113 mEq/L — ABNORMAL HIGH (ref 96–112)
Creatinine, Ser: 1.54 mg/dL — ABNORMAL HIGH (ref 0.50–1.35)
Glucose, Bld: 188 mg/dL — ABNORMAL HIGH (ref 70–99)

## 2013-02-21 MED ORDER — LABETALOL HCL 5 MG/ML IV SOLN
10.0000 mg | INTRAVENOUS | Status: DC | PRN
  Administered 2013-02-21: 10 mg via INTRAVENOUS
  Filled 2013-02-21: qty 4

## 2013-02-21 MED ORDER — DEXTROSE 5 % IV SOLN
5.0000 mg/h | INTRAVENOUS | Status: DC
  Administered 2013-02-21: 10 mg/h via INTRAVENOUS
  Administered 2013-02-21: 5 mg/h via INTRAVENOUS
  Administered 2013-02-21 – 2013-02-24 (×6): 10 mg/h via INTRAVENOUS
  Administered 2013-02-24: 5 mg/h via INTRAVENOUS
  Administered 2013-02-25 – 2013-02-26 (×3): 15 mg/h via INTRAVENOUS
  Filled 2013-02-21 (×6): qty 100

## 2013-02-21 NOTE — Progress Notes (Signed)
Subjective: Interval History: none.  Objective: Vital signs in last 24 hours: Temp:  [97.9 F (36.6 C)-98.9 F (37.2 C)] 97.9 F (36.6 C) (12/14 0400) Pulse Rate:  [62-139] 79 (12/14 0800) Resp:  [16-25] 18 (12/14 0800) BP: (112-193)/(45-84) 112/45 mmHg (12/14 0800) SpO2:  [91 %-99 %] 97 % (12/14 0800) Arterial Line BP: (145-211)/(45-76) 165/45 mmHg (12/14 0800) FiO2 (%):  [45 %] 45 % (12/14 0733) Weight:  [103.9 kg (229 lb 0.9 oz)] 103.9 kg (229 lb 0.9 oz) (12/14 0500) Weight change: 0.9 kg (1 lb 15.8 oz)  Intake/Output from previous day: 12/13 0701 - 12/14 0700 In: 1398.9 [I.V.:998.9; IV Piggyback:400] Out: 4050 [Urine:4050] Intake/Output this shift:    Generally patient is awake. Presently remains intubated and does not seem in any apparent distress Chest decreased breath sound anteriorly. He has some expiratory rhonchi. Heart exam revealed regular rate and rhythm no murmur Abdomen:Is  full, tympanic but nontender and positive bowel sound Extremities: He has 1+ edema bilaterally.  Lab Results: No results found for this basename: WBC, HGB, HCT, PLT,  in the last 72 hours BMET:   Recent Labs  02/20/13 0442 02/21/13 0505  NA 155* 156*  K 3.7 3.5  CL 113* 113*  CO2 28 30  GLUCOSE 133* 188*  BUN 78* 64*  CREATININE 1.86* 1.54*  CALCIUM 8.9 9.0   No results found for this basename: PTH,  in the last 72 hours Iron Studies: No results found for this basename: IRON, TIBC, TRANSFERRIN, FERRITIN,  in the last 72 hours  Studies/Results: Ct Angio Chest Pe W/cm &/or Wo Cm  02/19/2013   CLINICAL DATA:  Acute respiratory failure, shortness of breath, COPD, CHF. Evaluate for PE.  EXAM: CT ANGIOGRAPHY CHEST WITH CONTRAST  TECHNIQUE: Multidetector CT imaging of the chest was performed using the standard protocol during bolus administration of intravenous contrast. Multiplanar CT image reconstructions including MIPs were obtained to evaluate the vascular anatomy.  CONTRAST:   OMNIPAQUE IOHEXOL 350 MG/ML SOLN  COMPARISON:  Chest radiograph dated 02/19/2013  FINDINGS: No evidence of pulmonary embolism.  Endotracheal tube terminates 2.5 cm above the carina.  Severe centrilobular and paraseptal emphysematous changes. Mild patchy bilateral lower lobe opacities, likely atelectasis. No pleural effusion or pneumothorax.  Posterior bowing of the distal trachea (series 6/image 39) with narrowing of the bilateral mainstem bronchi (series 7/image 48).  Visualized thyroid is unremarkable.  The heart is normal in size. No pericardial effusion. Mild atherosclerotic calcifications of the aortic arch.  No suspicious mediastinal, hilar, or axillary lymphadenopathy.  Enteric tube courses into the cyst proximal stomach.  Mild degenerative changes of the visualized thoracolumbar spine. Apparent sternal irregularity is related to patient motion.  Review of the MIP images confirms the above findings.  IMPRESSION: No evidence of pulmonary embolism.  Stenosis of the distal trachea and bilateral mainstem bronchi.  Mild patchy bilateral lower lobe opacities, likely atelectasis.  Severe emphysematous changes.   Electronically Signed   By: Charline Bills M.D.   On: 02/19/2013 14:14   Dg Chest Port 1 View  02/19/2013   CLINICAL DATA:  Acute respiratory failure.  EXAM: PORTABLE CHEST - 1 VIEW 10:57 a.m.  COMPARISON:  02/19/2013 at 6:31 a.m.  FINDINGS: Endotracheal tube has been advanced and is now 4 cm above the carina. PICC tip is in good position in the superior vena cava. NG tube tip is below the diaphragm.  The pulmonary vascularity is slightly more prominent than on the prior exam. Interstitial markings are slightly  accentuated at the right base medially.  IMPRESSION: Endotracheal tube in good position. Slightly increased pulmonary vascularity.   Electronically Signed   By: Geanie Cooley M.D.   On: 02/19/2013 11:20    I have reviewed the patient's current medications.  Assessment/Plan: Problem  #1 acute kidney injury : His BUN and creatinine presently he is improving.  Problem #2 chronic renal failure . Patient with stage III chronic renal failure. His ultrasound didn't show any obstruction. Problem #3 hypernatremia. Sodium is 156 worsening possibly from lack of free water. Patient is on D5 water. Problem #4 hypokalemia: His potassium has corrected.  Problem #5 respiratory failure. Presently patient is intubated. Problem #6 history of psoriasis Problem #7 hypertension: His blood pressures seem to be controlled. Problem #8 history of COPD/sleep apnea Problem# 9 CHF on lasix with good urine out put . Patient have about 4 L of urine output. Still he has some edema. Plan: We'll stat on d5w at 125 cc/hr           We'll continue with Lasix with present dose.            We'll check his basic metabolic panel in the morning.  LOS: 8 days   Candiace West S 02/21/2013,8:37 AM

## 2013-02-21 NOTE — Progress Notes (Signed)
Subjective: He is generally about the same. He is awake and motion he would like to have the endotracheal tube removed.  Objective: Vital signs in last 24 hours: Temp:  [97.9 F (36.6 C)-98.9 F (37.2 C)] 97.9 F (36.6 C) (12/14 0400) Pulse Rate:  [62-139] 71 (12/14 1000) Resp:  [16-25] 18 (12/14 1000) BP: (112-193)/(45-84) 128/55 mmHg (12/14 1000) SpO2:  [91 %-99 %] 98 % (12/14 1000) Arterial Line BP: (145-211)/(45-76) 155/49 mmHg (12/14 1000) FiO2 (%):  [45 %] 45 % (12/14 0733) Weight:  [103.9 kg (229 lb 0.9 oz)] 103.9 kg (229 lb 0.9 oz) (12/14 0500) Weight change: 0.9 kg (1 lb 15.8 oz) Last BM Date: 02/16/13  Intake/Output from previous day: 12/13 0701 - 12/14 0700 In: 1398.9 [I.V.:998.9; IV Piggyback:400] Out: 4050 [Urine:4050]  PHYSICAL EXAM General appearance: alert, cooperative and mild distress Resp: clear to auscultation bilaterally Cardio: regular rate and rhythm, S1, S2 normal, no murmur, click, rub or gallop GI: soft, non-tender; bowel sounds normal; no masses,  no organomegaly Extremities: edema 1+  Lab Results:    Basic Metabolic Panel:  Recent Labs  16/10/96 0416 02/20/13 0442 02/21/13 0505  NA 149* 155* 156*  K 3.6 3.7 3.5  CL 108 113* 113*  CO2 30 28 30   GLUCOSE 168* 133* 188*  BUN 69* 78* 64*  CREATININE 1.57* 1.86* 1.54*  CALCIUM 8.7 8.9 9.0  PHOS 3.9  --   --    Liver Function Tests: No results found for this basename: AST, ALT, ALKPHOS, BILITOT, PROT, ALBUMIN,  in the last 72 hours No results found for this basename: LIPASE, AMYLASE,  in the last 72 hours No results found for this basename: AMMONIA,  in the last 72 hours CBC: No results found for this basename: WBC, NEUTROABS, HGB, HCT, MCV, PLT,  in the last 72 hours Cardiac Enzymes: No results found for this basename: CKTOTAL, CKMB, CKMBINDEX, TROPONINI,  in the last 72 hours BNP: No results found for this basename: PROBNP,  in the last 72 hours D-Dimer: No results found for this  basename: DDIMER,  in the last 72 hours CBG:  Recent Labs  02/20/13 1618 02/20/13 2004 02/20/13 2355 02/21/13 0309 02/21/13 0520 02/21/13 0810  GLUCAP 161* 175* 172* 137* 182* 156*   Hemoglobin A1C: No results found for this basename: HGBA1C,  in the last 72 hours Fasting Lipid Panel:  Recent Labs  02/20/13 0442  TRIG 416*   Thyroid Function Tests: No results found for this basename: TSH, T4TOTAL, FREET4, T3FREE, THYROIDAB,  in the last 72 hours Anemia Panel: No results found for this basename: VITAMINB12, FOLATE, FERRITIN, TIBC, IRON, RETICCTPCT,  in the last 72 hours Coagulation: No results found for this basename: LABPROT, INR,  in the last 72 hours Urine Drug Screen: Drugs of Abuse     Component Value Date/Time   LABOPIA NONE DETECTED 03/01/2012 0019   COCAINSCRNUR POSITIVE* 03/01/2012 0019   LABBENZ NONE DETECTED 03/01/2012 0019   AMPHETMU NONE DETECTED 03/01/2012 0019   THCU NONE DETECTED 03/01/2012 0019   LABBARB NONE DETECTED 03/01/2012 0019    Alcohol Level: No results found for this basename: ETH,  in the last 72 hours Urinalysis: No results found for this basename: COLORURINE, APPERANCEUR, LABSPEC, PHURINE, GLUCOSEU, HGBUR, BILIRUBINUR, KETONESUR, PROTEINUR, UROBILINOGEN, NITRITE, LEUKOCYTESUR,  in the last 72 hours Misc. Labs:  ABGS  Recent Labs  02/19/13 0500  PHART 7.430  PO2ART 122.0*  TCO2 22.6  HCO3 25.4*   CULTURES Recent Results (from the past  240 hour(s))  MRSA PCR SCREENING     Status: None   Collection Time    02/14/13  1:33 AM      Result Value Range Status   MRSA by PCR NEGATIVE  NEGATIVE Final   Comment:            The GeneXpert MRSA Assay (FDA     approved for NASAL specimens     only), is one component of a     comprehensive MRSA colonization     surveillance program. It is not     intended to diagnose MRSA     infection nor to guide or     monitor treatment for     MRSA infections.  CULTURE, RESPIRATORY  (NON-EXPECTORATED)     Status: None   Collection Time    02/14/13  2:00 PM      Result Value Range Status   Specimen Description ASPIRATE   Final   Special Requests NONE   Final   Gram Stain     Final   Value: ABUNDANT WBC PRESENT, PREDOMINANTLY PMN     FEW SQUAMOUS EPITHELIAL CELLS PRESENT     FEW GRAM POSITIVE COCCI IN PAIRS     RARE GRAM NEGATIVE RODS     Performed at [facility]   Culture     Final   Value: Non-Pathogenic Oropharyngeal-type Flora Isolated.     Performed at [facility]   Report Status 02/16/2013 FINAL   Final  URINE CULTURE     Status: None   Collection Time    02/16/13 10:25 AM      Result Value Range Status   Specimen Description URINE, CATHETERIZED   Final   Special Requests NONE   Final   Culture  Setup Time     Final   Value: 02/16/2013 14:29     Performed at [facility]   Colony Count     Final   Value: NO GROWTH     Performed at [facility]   Culture     Final   Value: NO GROWTH     Performed at [facility]   Report Status 02/17/2013 FINAL   Final   Studies/Results: Ct Angio Chest Pe W/cm &/or Wo Cm  02/19/2013   CLINICAL DATA:  Acute respiratory failure, shortness of breath, COPD, CHF. Evaluate for PE.  EXAM: CT ANGIOGRAPHY CHEST WITH CONTRAST  TECHNIQUE: Multidetector CT imaging of the chest was performed using the standard protocol during bolus administration of intravenous contrast. Multiplanar CT image reconstructions including MIPs were obtained to evaluate the vascular anatomy.  CONTRAST:  OMNIPAQUE IOHEXOL 350 MG/ML SOLN  COMPARISON:  Chest radiograph dated 02/19/2013  FINDINGS: No evidence of pulmonary embolism.  Endotracheal tube terminates 2.5 cm above the carina.  Severe centrilobular and paraseptal emphysematous changes. Mild patchy bilateral lower lobe opacities, likely atelectasis. No pleural effusion or pneumothorax.  Posterior bowing of the distal trachea (series 6/image 39) with  narrowing of the bilateral mainstem bronchi (series 7/image 48).  Visualized thyroid is unremarkable.  The heart is normal in size. No pericardial effusion. Mild atherosclerotic calcifications of the aortic arch.  No suspicious mediastinal, hilar, or axillary lymphadenopathy.  Enteric tube courses into the cyst proximal stomach.  Mild degenerative changes of the visualized thoracolumbar spine. Apparent sternal irregularity is related to patient motion.  Review of the MIP images confirms the above findings.  IMPRESSION: No evidence of pulmonary embolism.  Stenosis of the  distal trachea and bilateral mainstem bronchi.  Mild patchy bilateral lower lobe opacities, likely atelectasis.  Severe emphysematous changes.   Electronically Signed   By: Charline Bills M.D.   On: 02/19/2013 14:14   Dg Chest Port 1 View  02/19/2013   CLINICAL DATA:  Acute respiratory failure.  EXAM: PORTABLE CHEST - 1 VIEW 10:57 a.m.  COMPARISON:  02/19/2013 at 6:31 a.m.  FINDINGS: Endotracheal tube has been advanced and is now 4 cm above the carina. PICC tip is in good position in the superior vena cava. NG tube tip is below the diaphragm.  The pulmonary vascularity is slightly more prominent than on the prior exam. Interstitial markings are slightly accentuated at the right base medially.  IMPRESSION: Endotracheal tube in good position. Slightly increased pulmonary vascularity.   Electronically Signed   By: Geanie Cooley M.D.   On: 02/19/2013 11:20    Medications:  Scheduled: . antiseptic oral rinse  15 mL Mouth Rinse QID  . chlorhexidine  15 mL Mouth/Throat BID  . furosemide  80 mg Intravenous Q12H  . heparin  5,000 Units Subcutaneous Q8H  . insulin aspart  0-20 Units Subcutaneous Q4H  . ipratropium  0.5 mg Nebulization Q4H  . levalbuterol  0.63 mg Nebulization Q4H  . methylPREDNISolone (SOLU-MEDROL) injection  125 mg Intravenous Q6H  . pantoprazole (PROTONIX) IV  40 mg Intravenous QHS  . piperacillin-tazobactam (ZOSYN)  IV   3.375 g Intravenous Q8H  . sodium chloride  10-40 mL Intracatheter Q12H  . vancomycin  1,250 mg Intravenous Q24H   Continuous: . dextrose 125 mL/hr at 02/21/13 1002  . diltiazem (CARDIZEM) infusion 10 mg/hr (02/21/13 1002)  . fentaNYL infusion INTRAVENOUS 50 mcg/hr (02/20/13 1800)  . propofol Stopped (02/20/13 1217)   NWG:NFAOZH chloride, fentaNYL, labetalol, LORazepam, sodium chloride  Assesment: He has acute on chronic respiratory failure. He has acute on chronic renal failure. He has acute on chronic diastolic heart failure. He failed extubation last week. He is on good treatment for his COPD and does not have a definite pneumonia on chest x-ray. I think some of this may be related to his heart failure so he had increased doses of diuretics. Principal Problem:   Respiratory failure, acute Active Problems:   COPD exacerbation   Acute on chronic renal failure   Acute-on-chronic respiratory failure   Bilateral lower extremity edema   Obstructive sleep apnea   Fever   Acute respiratory failure   Acute on chronic diastolic heart failure   Hypokalemia    Plan: Continue increased dose of diuretics. I plan to see if we can get him weaned tomorrow. If not he may need to be transferred to a higher level of care    LOS: 8 days   Lofton Leon L 02/21/2013, 10:16 AM

## 2013-02-22 LAB — GLUCOSE, CAPILLARY
Glucose-Capillary: 203 mg/dL — ABNORMAL HIGH (ref 70–99)
Glucose-Capillary: 175 mg/dL — ABNORMAL HIGH (ref 70–99)
Glucose-Capillary: 191 mg/dL — ABNORMAL HIGH (ref 70–99)
Glucose-Capillary: 169 mg/dL — ABNORMAL HIGH (ref 70–99)

## 2013-02-22 LAB — BASIC METABOLIC PANEL
BUN: 55 mg/dL — ABNORMAL HIGH (ref 6–23)
CO2: 34 mEq/L — ABNORMAL HIGH (ref 19–32)
Calcium: 8.9 mg/dL (ref 8.4–10.5)
Creatinine, Ser: 1.43 mg/dL — ABNORMAL HIGH (ref 0.50–1.35)
Glucose, Bld: 200 mg/dL — ABNORMAL HIGH (ref 70–99)

## 2013-02-22 MED ORDER — POTASSIUM CL IN DEXTROSE 5% 20 MEQ/L IV SOLN
20.0000 meq | INTRAVENOUS | Status: DC
  Administered 2013-02-22 – 2013-02-23 (×2): 20 meq via INTRAVENOUS

## 2013-02-22 NOTE — Progress Notes (Signed)
Nutrition Follow-up  INTERVENTION: Recommend  resume tube feeding at previous rate if agreeable with MD. Pt meeting 25% estimated energy requirements, 0% estimated protein goal.   IVF-D5 @125  ml/hr provides: 3000 ml, 510 kcal q 24 hr.   NUTRITION DIAGNOSIS: Inadequate oral intake related to inability to eat as evidenced by NPO status; ongoing.  Goal: Enteral nutrition to provide 60-70% of estimated calorie needs (22-25 kcals/kg ideal body weight or 1780-2022 kcal/day) and 100% of estimated protein needs, based on ASPEN guidelines for permissive underfeeding in critically ill obese individuals.  Goal; Not Met.  Monitor:  Respiratory status, nutrition support tolerance, labs, I/O's and wt changes  57 y.o. male  Admitting Dx: Respiratory failure, acute  ASSESSMENT: Patient continues on ventilator support. Enteral nutrition remains on hold. Call in to Dr. Juanetta Gosling requesting consideration for resuming TF.   MV: 11.3 L/min Temp (24hrs), Avg:97.9 F (36.6 C), Min:96.9 F (36.1 C), Max:99.1 F (37.3 C)  Propofol: 0 ml/hr   Height: Ht Readings from Last 1 Encounters:  02/22/13 6' (1.829 m)    Weight: Wt Readings from Last 1 Encounters:  02/22/13 221 lb 12.5 oz (100.6 kg)    Ideal Body Weight: 178# (80.9 kg)  Wt Readings from Last 10 Encounters:  02/22/13 221 lb 12.5 oz (100.6 kg)  01/19/13 230 lb 4.8 oz (104.463 kg)  01/03/13 220 lb 14.4 oz (100.2 kg)  12/29/12 225 lb (102.059 kg)  12/16/12 224 lb 12 oz (101.946 kg)  12/01/12 227 lb 4.7 oz (103.1 kg)  10/07/12 230 lb (104.327 kg)  07/23/12 240 lb 1.3 oz (108.9 kg)  04/30/12 232 lb 12.8 oz (105.597 kg)  03/12/12 229 lb 15 oz (104.3 kg)    Usual Body Weight: 224-238#   BMI:  Body mass index is 30.07 kg/(m^2).obesity class I  Re-estimated Nutritional Needs: Kcal: 2038 Protein: 121-137 gr Fluid: >2200 ml daily  Skin:  Intact  BM: 02/16/13  Diet Order: NPO  EDUCATION NEEDS: -No education needs identified at  this time   Intake/Output Summary (Last 24 hours) at 02/22/13 1121 Last data filed at 02/22/13 0500  Gross per 24 hour  Intake 995.83 ml  Output   3125 ml  Net -2129.17 ml    Labs:   Recent Labs Lab 02/18/13 0426 02/19/13 0416 02/20/13 0442 02/21/13 0505 02/22/13 0431  NA 146* 149* 155* 156* 149*  K 3.5 3.6 3.7 3.5 2.9*  CL 104 108 113* 113* 106  CO2 26 30 28 30  34*  BUN 64* 69* 78* 64* 55*  CREATININE 1.80* 1.57* 1.86* 1.54* 1.43*  CALCIUM 8.5  8.3* 8.7 8.9 9.0 8.9  PHOS 5.4* 3.9  --   --   --   GLUCOSE 146* 168* 133* 188* 200*    CBG (last 3)   Recent Labs  02/21/13 2319 02/22/13 0344 02/22/13 0735  GLUCAP 186* 203* 175*    Scheduled Meds: . antiseptic oral rinse  15 mL Mouth Rinse QID  . chlorhexidine  15 mL Mouth/Throat BID  . furosemide  80 mg Intravenous Q12H  . heparin  5,000 Units Subcutaneous Q8H  . insulin aspart  0-20 Units Subcutaneous Q4H  . ipratropium  0.5 mg Nebulization Q4H  . levalbuterol  0.63 mg Nebulization Q4H  . methylPREDNISolone (SOLU-MEDROL) injection  125 mg Intravenous Q6H  . pantoprazole (PROTONIX) IV  40 mg Intravenous QHS  . sodium chloride  10-40 mL Intracatheter Q12H    Continuous Infusions: . dextrose 5 % with KCl 20 mEq / L 20  mEq (02/22/13 0904)  . diltiazem (CARDIZEM) infusion 10 mg/hr (02/22/13 0644)  . fentaNYL infusion INTRAVENOUS 50 mcg/hr (02/21/13 1800)    Past Medical History  Diagnosis Date  . Hypertension   . Dyspnea   . Chronic kidney disease, stage 2, mildly decreased GFR     Creatinine of 1.31 in 04/2011  . COPD (chronic obstructive pulmonary disease)     moderate by spirometry in 08/2009;FEV1 of 1.1 ;nl alpha -1 antitrypsin   . Obesity   . Psoriasis   . Left eye trauma     with loss of vision  . Erectile dysfunction   . Lumbosacral spinal stenosis     chronic low back pain  . Headache(784.0)   . Sleep apnea     O2 at night  . Carotid artery aneurysm   . Asthma   . Psoriasis   . CHF  (congestive heart failure)   . Renal insufficiency     Past Surgical History  Procedure Laterality Date  . Orif patella  12/14/2005    left fracture Dr.Harrison   . Ophthalmologic surgery  1963    secondary to left eye trauma, as a child hit in eye with tree limb   . Colonoscopy N/A 05/06/2012    RMR: internal hemorrhoids, tubular adenoma, cecal ulcerations with benign path, repeat in 2019  . Esophagogastroduodenoscopy  05/06/12    RMR: probably cervical esophageal web s/p 35 F dilation, hiatal hernia, antral and bulbar erosions with negative H.pylori    Royann Shivers MS,RD,CSG,LDN Office: [phone] Pager: [phone]

## 2013-02-22 NOTE — Progress Notes (Signed)
eLink Physician-Brief Progress Note Patient Name: [name] DOB: 02-02-1956 MRN: [id number]  Date of Service  02/22/2013   HPI/Events of Note     eICU Interventions  Resume TF      YACOUB,WESAM 02/22/2013, 6:48 PM

## 2013-02-22 NOTE — Progress Notes (Signed)
Subjective: He motions that he feels okay and would like to have the tube out. No new problems identified  Objective: Vital signs in last 24 hours: Temp:  [96.9 F (36.1 C)-99.1 F (37.3 C)] 98.5 F (36.9 C) (12/15 0400) Pulse Rate:  [54-125] 84 (12/15 0630) Resp:  [16-25] 18 (12/15 0630) BP: (107-142)/(41-67) 126/61 mmHg (12/15 0630) SpO2:  [95 %-99 %] 97 % (12/15 0630) Arterial Line BP: (142-190)/(39-60) 160/45 mmHg (12/15 0630) FiO2 (%):  [45 %] 45 % (12/15 0453) Weight:  [100.6 kg (221 lb 12.5 oz)] 100.6 kg (221 lb 12.5 oz) (12/15 0446) Weight change: -3.3 kg (-7 lb 4.4 oz) Last BM Date: 02/16/13  Intake/Output from previous day: 12/14 0701 - 12/15 0700 In: 2387.5 [I.V.:2287.5; IV Piggyback:100] Out: 3125 [Urine:3125]  PHYSICAL EXAM General appearance: alert, cooperative and mild distress Resp: rhonchi bilaterally Cardio: regular rate and rhythm, S1, S2 normal, no murmur, click, rub or gallop GI: soft, non-tender; bowel sounds normal; no masses,  no organomegaly Extremities: Chronic 1+ edema  Lab Results:    Basic Metabolic Panel:  Recent Labs  57/84/69 0505 02/22/13 0431  NA 156* 149*  K 3.5 2.9*  CL 113* 106  CO2 30 34*  GLUCOSE 188* 200*  BUN 64* 55*  CREATININE 1.54* 1.43*  CALCIUM 9.0 8.9   Liver Function Tests: No results found for this basename: AST, ALT, ALKPHOS, BILITOT, PROT, ALBUMIN,  in the last 72 hours No results found for this basename: LIPASE, AMYLASE,  in the last 72 hours No results found for this basename: AMMONIA,  in the last 72 hours CBC:  Recent Labs  02/21/13 0504  WBC 11.0*  NEUTROABS 10.2*  HGB 10.0*  HCT 33.2*  MCV 102.8*  PLT 186   Cardiac Enzymes: No results found for this basename: CKTOTAL, CKMB, CKMBINDEX, TROPONINI,  in the last 72 hours BNP: No results found for this basename: PROBNP,  in the last 72 hours D-Dimer: No results found for this basename: DDIMER,  in the last 72 hours CBG:  Recent Labs  02/21/13 1121 02/21/13 1612 02/21/13 2003 02/21/13 2319 02/22/13 0344 02/22/13 0735  GLUCAP 133* 176* 179* 186* 203* 175*   Hemoglobin A1C: No results found for this basename: HGBA1C,  in the last 72 hours Fasting Lipid Panel:  Recent Labs  02/20/13 0442  TRIG 416*   Thyroid Function Tests: No results found for this basename: TSH, T4TOTAL, FREET4, T3FREE, THYROIDAB,  in the last 72 hours Anemia Panel: No results found for this basename: VITAMINB12, FOLATE, FERRITIN, TIBC, IRON, RETICCTPCT,  in the last 72 hours Coagulation: No results found for this basename: LABPROT, INR,  in the last 72 hours Urine Drug Screen: Drugs of Abuse     Component Value Date/Time   LABOPIA NONE DETECTED 03/01/2012 0019   COCAINSCRNUR POSITIVE* 03/01/2012 0019   LABBENZ NONE DETECTED 03/01/2012 0019   AMPHETMU NONE DETECTED 03/01/2012 0019   THCU NONE DETECTED 03/01/2012 0019   LABBARB NONE DETECTED 03/01/2012 0019    Alcohol Level: No results found for this basename: ETH,  in the last 72 hours Urinalysis: No results found for this basename: COLORURINE, APPERANCEUR, LABSPEC, PHURINE, GLUCOSEU, HGBUR, BILIRUBINUR, KETONESUR, PROTEINUR, UROBILINOGEN, NITRITE, LEUKOCYTESUR,  in the last 72 hours Misc. Labs:  ABGS No results found for this basename: PHART, PCO2, PO2ART, TCO2, HCO3,  in the last 72 hours CULTURES Recent Results (from the past 240 hour(s))  MRSA PCR SCREENING     Status: None   Collection Time  02/14/13  1:33 AM      Result Value Range Status   MRSA by PCR NEGATIVE  NEGATIVE Final   Comment:            The GeneXpert MRSA Assay (FDA     approved for NASAL specimens     only), is one component of a     comprehensive MRSA colonization     surveillance program. It is not     intended to diagnose MRSA     infection nor to guide or     monitor treatment for     MRSA infections.  CULTURE, RESPIRATORY (NON-EXPECTORATED)     Status: None   Collection Time    02/14/13  2:00  PM      Result Value Range Status   Specimen Description ASPIRATE   Final   Special Requests NONE   Final   Gram Stain     Final   Value: ABUNDANT WBC PRESENT, PREDOMINANTLY PMN     FEW SQUAMOUS EPITHELIAL CELLS PRESENT     FEW GRAM POSITIVE COCCI IN PAIRS     RARE GRAM NEGATIVE RODS     Performed at [facility]   Culture     Final   Value: Non-Pathogenic Oropharyngeal-type Flora Isolated.     Performed at [facility]   Report Status 02/16/2013 FINAL   Final  URINE CULTURE     Status: None   Collection Time    02/16/13 10:25 AM      Result Value Range Status   Specimen Description URINE, CATHETERIZED   Final   Special Requests NONE   Final   Culture  Setup Time     Final   Value: 02/16/2013 14:29     Performed at [facility] Count     Final   Value: NO GROWTH     Performed at [facility]   Culture     Final   Value: NO GROWTH     Performed at [facility]   Report Status 02/17/2013 FINAL   Final   Studies/Results: No results found.  Medications:  Prior to Admission:  Prescriptions prior to admission  Medication Sig Dispense Refill  . acetaminophen-codeine (TYLENOL #3) 300-30 MG per tablet Take 1 tablet by mouth every 4 (four) hours as needed for pain.  60 tablet  5  . albuterol (PROVENTIL HFA;VENTOLIN HFA) 108 (90 BASE) MCG/ACT inhaler Inhale 2 puffs into the lungs as needed for wheezing (rescue inhaler).       Marland Kitchen albuterol (PROVENTIL) (2.5 MG/3ML) 0.083% nebulizer solution Take 2.5 mg by nebulization every 6 (six) hours as needed for wheezing or shortness of breath.      . cloNIDine (CATAPRES) 0.2 MG tablet Take 0.2 mg by mouth 3 (three) times daily.      Marland Kitchen docusate sodium (COLACE) 100 MG capsule Take 100 mg by mouth 2 (two) times daily as needed for mild constipation.      . furosemide (LASIX) 40 MG tablet Take 1 tablet (40 mg total) by mouth 2 (two) times daily.  30 tablet  5  . guaiFENesin (MUCINEX) 600 MG 12 hr  tablet Take 1,200 mg by mouth at bedtime as needed for to loosen phlegm.       Marland Kitchen HYDROcodone-acetaminophen (NORCO/VICODIN) 5-325 MG per tablet Take 1 tablet by mouth every 6 (six) hours as needed for pain.  120 tablet  0  . methotrexate (RHEUMATREX) 2.5 MG tablet  Take 10 mg by mouth once a week. Saturday      . mometasone-formoterol (DULERA) 200-5 MCG/ACT AERO Inhale 2 puffs into the lungs 2 (two) times daily.  1 Inhaler  12  . pantoprazole (PROTONIX) 40 MG tablet Take 1 tablet (40 mg total) by mouth 2 (two) times daily.  60 tablet  12  . predniSONE (DELTASONE) 10 MG tablet Take 10 mg by mouth 2 (two) times daily.      . ranitidine (ZANTAC) 300 MG tablet Take 300 mg by mouth at bedtime.       Scheduled: . antiseptic oral rinse  15 mL Mouth Rinse QID  . chlorhexidine  15 mL Mouth/Throat BID  . furosemide  80 mg Intravenous Q12H  . heparin  5,000 Units Subcutaneous Q8H  . insulin aspart  0-20 Units Subcutaneous Q4H  . ipratropium  0.5 mg Nebulization Q4H  . levalbuterol  0.63 mg Nebulization Q4H  . methylPREDNISolone (SOLU-MEDROL) injection  125 mg Intravenous Q6H  . pantoprazole (PROTONIX) IV  40 mg Intravenous QHS  . sodium chloride  10-40 mL Intracatheter Q12H   Continuous: . dextrose 5 % with KCl 20 mEq / L    . diltiazem (CARDIZEM) infusion 10 mg/hr (02/22/13 0644)  . fentaNYL infusion INTRAVENOUS 50 mcg/hr (02/21/13 1800)  . propofol Stopped (02/20/13 1217)   NWG:NFAOZH chloride, fentaNYL, labetalol, LORazepam, sodium chloride  Assesment: He has acute on chronic respiratory failure. He has severe COPD. He has acute on chronic diastolic heart failure. He has been diuresing. He has acute on chronic renal failure which I think is about the same. He is on maximum treatment for COPD. He remains intubated and on the ventilator. Principal Problem:   Respiratory failure, acute Active Problems:   COPD exacerbation   Acute on chronic renal failure   Acute-on-chronic respiratory failure    Bilateral lower extremity edema   Obstructive sleep apnea   Fever   Acute respiratory failure   Acute on chronic diastolic heart failure   Hypokalemia    Plan: Attempt weaning today and see how he does. If he's not able to come off the ventilator he may need transfer to a higher level of care    LOS: 9 days   Delynn Olvera L 02/22/2013, 8:12 AM

## 2013-02-22 NOTE — Progress Notes (Signed)
Dr. Molli Knock was called to clarify the order to restart tube feeding. The order was received to hold the tube feeding for now until morning evaluation by primary care provider.

## 2013-02-22 NOTE — Progress Notes (Signed)
UR chart review completed.  

## 2013-02-22 NOTE — Progress Notes (Signed)
Subjective: Interval History: none.  Objective: Vital signs in last 24 hours: Temp:  [96.9 F (36.1 C)-99.1 F (37.3 C)] 98.5 F (36.9 C) (12/15 0400) Pulse Rate:  [54-125] 84 (12/15 0630) Resp:  [16-25] 18 (12/15 0630) BP: (107-142)/(41-67) 126/61 mmHg (12/15 0630) SpO2:  [95 %-99 %] 97 % (12/15 0630) Arterial Line BP: (142-190)/(39-60) 160/45 mmHg (12/15 0630) FiO2 (%):  [45 %] 45 % (12/15 0453) Weight:  [100.6 kg (221 lb 12.5 oz)] 100.6 kg (221 lb 12.5 oz) (12/15 0446) Weight change: -3.3 kg (-7 lb 4.4 oz)  Intake/Output from previous day: 12/14 0701 - 12/15 0700 In: 2387.5 [I.V.:2287.5; IV Piggyback:100] Out: 3125 [Urine:3125] Intake/Output this shift:    Generally patient is awake. Presently remains intubated and patient is able to comunicate with his hands. presently he doesn't seem to be in any apparent distress. Chest decreased breath sound anteriorly. He has some expiratory rhonchi. Heart exam revealed regular rate and rhythm no murmur Abdomen:Is  full, tympanic but nontender and positive bowel sound Extremities: He has 1+ edema bilaterally.  Lab Results:  Recent Labs  02/21/13 0504  WBC 11.0*  HGB 10.0*  HCT 33.2*  PLT 186   BMET:   Recent Labs  02/21/13 0505 02/22/13 0431  NA 156* 149*  K 3.5 2.9*  CL 113* 106  CO2 30 34*  GLUCOSE 188* 200*  BUN 64* 55*  CREATININE 1.54* 1.43*  CALCIUM 9.0 8.9   No results found for this basename: PTH,  in the last 72 hours Iron Studies: No results found for this basename: IRON, TIBC, TRANSFERRIN, FERRITIN,  in the last 72 hours  Studies/Results: No results found.  I have reviewed the patient's current medications.  Assessment/Plan: Problem #1 acute kidney injury : His BUN and creatinine presently he is improving.  Problem #2 chronic renal failure . Patient with stage III chronic renal failure.  Problem #3 hypernatremia. Sodium is 149 worsening possibly from lack of free water. Patient is on D5 water. His  sodium is improving. Problem #4 hypokalemia: His potassium has declined. Os likely from diuretics..  Problem #5 respiratory failure. Presently patient is intubated. Problem #6 history of psoriasis Problem #7 hypertension: His blood pressures seem to be controlled. Problem #8 history of COPD/sleep apnea Problem# 9 CHF on lasix with good urine out put . Patient have about 3.1 L of urine output. Still he has some edema. Plan: We'll change his IV fluid to D5 water with 20: KCl at 125 cc per hour.           We'll continue with Lasix with present dose.            We'll check his basic metabolic panel in the morning.  LOS: 9 days   Alexandria Shiflett S 02/22/2013,8:01 AM

## 2013-02-23 ENCOUNTER — Inpatient Hospital Stay (HOSPITAL_COMMUNITY): Payer: Medicare HMO

## 2013-02-23 LAB — BLOOD GAS, ARTERIAL
Bicarbonate: 32.1 mEq/L — ABNORMAL HIGH (ref 20.0–24.0)
FIO2: 0.4 %
O2 Content: 40 L/min
Pressure support: 5 cmH2O
TCO2: 29.1 mmol/L (ref 0–100)
pCO2 arterial: 56.5 mmHg — ABNORMAL HIGH (ref 35.0–45.0)
pH, Arterial: 7.373 (ref 7.350–7.450)
pO2, Arterial: 67.4 mmHg — ABNORMAL LOW (ref 80.0–100.0)

## 2013-02-23 LAB — BASIC METABOLIC PANEL
BUN: 47 mg/dL — ABNORMAL HIGH (ref 6–23)
CO2: 33 mEq/L — ABNORMAL HIGH (ref 19–32)
Calcium: 8.7 mg/dL (ref 8.4–10.5)
Creatinine, Ser: 1.32 mg/dL (ref 0.50–1.35)
GFR calc Af Amer: 67 mL/min — ABNORMAL LOW (ref >90)
Glucose, Bld: 200 mg/dL — ABNORMAL HIGH (ref 70–99)

## 2013-02-23 LAB — GLUCOSE, CAPILLARY
Glucose-Capillary: 190 mg/dL — ABNORMAL HIGH (ref 70–99)
Glucose-Capillary: 217 mg/dL — ABNORMAL HIGH (ref 70–99)
Glucose-Capillary: 162 mg/dL — ABNORMAL HIGH (ref 70–99)
Glucose-Capillary: 162 mg/dL — ABNORMAL HIGH (ref 70–99)

## 2013-02-23 LAB — CBC WITH DIFFERENTIAL/PLATELET
Basophils Relative: 0 % (ref 0–1)
Eosinophils Relative: 0 % (ref 0–5)
HCT: 36.7 % — ABNORMAL LOW (ref 39.0–52.0)
Hemoglobin: 11.3 g/dL — ABNORMAL LOW (ref 13.0–17.0)
Lymphocytes Relative: 5 % — ABNORMAL LOW (ref 12–46)
Monocytes Absolute: 0.6 10*3/uL (ref 0.1–1.0)
Monocytes Relative: 4 % (ref 3–12)
Neutro Abs: 14.5 10*3/uL — ABNORMAL HIGH (ref 1.7–7.7)
Neutrophils Relative %: 91 % — ABNORMAL HIGH (ref 43–77)
RBC: 3.67 MIL/uL — ABNORMAL LOW (ref 4.22–5.81)
WBC: 15.9 10*3/uL — ABNORMAL HIGH (ref 4.0–10.5)

## 2013-02-23 MED ORDER — POTASSIUM CL IN DEXTROSE 5% 20 MEQ/L IV SOLN: 20.0000 meq | INTRAVENOUS | Status: DC

## 2013-02-23 MED ORDER — SODIUM CHLORIDE 0.9 % IV SOLN
1.5000 g | Freq: Four times a day (QID) | INTRAVENOUS | Status: DC
  Administered 2013-02-23 – 2013-02-26 (×12): 1.5 g via INTRAVENOUS
  Filled 2013-02-23 (×16): qty 1.5

## 2013-02-23 MED ORDER — VITAL AF 1.2 CAL PO LIQD
1000.0000 mL | ORAL | Status: DC
  Administered 2013-02-23 – 2013-02-24 (×2): 1000 mL
  Filled 2013-02-23 (×3): qty 1000

## 2013-02-23 NOTE — Progress Notes (Signed)
Subjective: He is awake and alert. He is motioning that he's ready to try weaning again. He has extensive areas in bruising etc. were he's been getting heparin injections. He is back on tube feedings and tolerating them so far.  Objective: Vital signs in last 24 hours: Temp:  [97.8 F (36.6 C)-98.1 F (36.7 C)] 98.1 F (36.7 C) (12/16 0340) Pulse Rate:  [67-103] 69 (12/16 0600) Resp:  [13-20] 16 (12/16 0600) BP: (106-150)/(39-60) 112/52 mmHg (12/16 0600) SpO2:  [93 %-99 %] 98 % (12/16 0738) Arterial Line BP: (130-221)/(40-82) 188/54 mmHg (12/15 1800) FiO2 (%):  [40 %-45 %] 40 % (12/16 0806) Weight:  [101.5 kg (223 lb 12.3 oz)] 101.5 kg (223 lb 12.3 oz) (12/16 0500) Weight change: 0.9 kg (1 lb 15.8 oz) Last BM Date: 02/22/13  Intake/Output from previous day: 12/15 0701 - 12/16 0700 In: 1724.1 [I.V.:1724.1] Out: 2250 [Urine:2250]  PHYSICAL EXAM General appearance: alert, cooperative and mild distress Resp: rhonchi bilaterally Cardio: irregularly irregular rhythm GI: soft, non-tender; bowel sounds normal; no masses,  no organomegaly Extremities: extremities normal, atraumatic, no cyanosis or edema  Lab Results:    Basic Metabolic Panel:  Recent Labs  16/10/96 0431 02/23/13 0427  NA 149* 144  K 2.9* 3.9  CL 106 101  CO2 34* 33*  GLUCOSE 200* 200*  BUN 55* 47*  CREATININE 1.43* 1.32  CALCIUM 8.9 8.7   Liver Function Tests: No results found for this basename: AST, ALT, ALKPHOS, BILITOT, PROT, ALBUMIN,  in the last 72 hours No results found for this basename: LIPASE, AMYLASE,  in the last 72 hours No results found for this basename: AMMONIA,  in the last 72 hours CBC:  Recent Labs  02/21/13 0504  WBC 11.0*  NEUTROABS 10.2*  HGB 10.0*  HCT 33.2*  MCV 102.8*  PLT 186   Cardiac Enzymes: No results found for this basename: CKTOTAL, CKMB, CKMBINDEX, TROPONINI,  in the last 72 hours BNP: No results found for this basename: PROBNP,  in the last 72  hours D-Dimer: No results found for this basename: DDIMER,  in the last 72 hours CBG:  Recent Labs  02/22/13 1152 02/22/13 1640 02/22/13 2014 02/23/13 0006 02/23/13 0313 02/23/13 0740  GLUCAP 191* 191* 169* 178* 190* 217*   Hemoglobin A1C: No results found for this basename: HGBA1C,  in the last 72 hours Fasting Lipid Panel: No results found for this basename: CHOL, HDL, LDLCALC, TRIG, CHOLHDL, LDLDIRECT,  in the last 72 hours Thyroid Function Tests: No results found for this basename: TSH, T4TOTAL, FREET4, T3FREE, THYROIDAB,  in the last 72 hours Anemia Panel: No results found for this basename: VITAMINB12, FOLATE, FERRITIN, TIBC, IRON, RETICCTPCT,  in the last 72 hours Coagulation: No results found for this basename: LABPROT, INR,  in the last 72 hours Urine Drug Screen: Drugs of Abuse     Component Value Date/Time   LABOPIA NONE DETECTED 03/01/2012 0019   COCAINSCRNUR POSITIVE* 03/01/2012 0019   LABBENZ NONE DETECTED 03/01/2012 0019   AMPHETMU NONE DETECTED 03/01/2012 0019   THCU NONE DETECTED 03/01/2012 0019   LABBARB NONE DETECTED 03/01/2012 0019    Alcohol Level: No results found for this basename: ETH,  in the last 72 hours Urinalysis: No results found for this basename: COLORURINE, APPERANCEUR, LABSPEC, PHURINE, GLUCOSEU, HGBUR, BILIRUBINUR, KETONESUR, PROTEINUR, UROBILINOGEN, NITRITE, LEUKOCYTESUR,  in the last 72 hours Misc. Labs:  ABGS No results found for this basename: PHART, PCO2, PO2ART, TCO2, HCO3,  in the last 72 hours CULTURES Recent  Results (from the past 240 hour(s))  MRSA PCR SCREENING     Status: None   Collection Time    02/14/13  1:33 AM      Result Value Range Status   MRSA by PCR NEGATIVE  NEGATIVE Final   Comment:            The GeneXpert MRSA Assay (FDA     approved for NASAL specimens     only), is one component of a     comprehensive MRSA colonization     surveillance program. It is not     intended to diagnose MRSA     infection  nor to guide or     monitor treatment for     MRSA infections.  CULTURE, RESPIRATORY (NON-EXPECTORATED)     Status: None   Collection Time    02/14/13  2:00 PM      Result Value Range Status   Specimen Description ASPIRATE   Final   Special Requests NONE   Final   Gram Stain     Final   Value: ABUNDANT WBC PRESENT, PREDOMINANTLY PMN     FEW SQUAMOUS EPITHELIAL CELLS PRESENT     FEW GRAM POSITIVE COCCI IN PAIRS     RARE GRAM NEGATIVE RODS     Performed at [facility]   Culture     Final   Value: Non-Pathogenic Oropharyngeal-type Flora Isolated.     Performed at [facility]   Report Status 02/16/2013 FINAL   Final  URINE CULTURE     Status: None   Collection Time    02/16/13 10:25 AM      Result Value Range Status   Specimen Description URINE, CATHETERIZED   Final   Special Requests NONE   Final   Culture  Setup Time     Final   Value: 02/16/2013 14:29     Performed at [facility] Count     Final   Value: NO GROWTH     Performed at [facility]   Culture     Final   Value: NO GROWTH     Performed at [facility]   Report Status 02/17/2013 FINAL   Final   Studies/Results: No results found.  Medications:  Scheduled: . antiseptic oral rinse  15 mL Mouth Rinse QID  . chlorhexidine  15 mL Mouth/Throat BID  . furosemide  80 mg Intravenous Q12H  . heparin  5,000 Units Subcutaneous Q8H  . insulin aspart  0-20 Units Subcutaneous Q4H  . ipratropium  0.5 mg Nebulization Q4H  . levalbuterol  0.63 mg Nebulization Q4H  . methylPREDNISolone (SOLU-MEDROL) injection  125 mg Intravenous Q6H  . pantoprazole (PROTONIX) IV  40 mg Intravenous QHS  . sodium chloride  10-40 mL Intracatheter Q12H   Continuous: . dextrose 5 % with KCl 20 mEq / L 20 mEq (02/23/13 0730)  . diltiazem (CARDIZEM) infusion 10 mg/hr (02/23/13 0318)  . fentaNYL infusion INTRAVENOUS 50 mcg/hr (02/21/13 1800)   FAO:ZHYQMV chloride, fentaNYL, labetalol, LORazepam,  sodium chloride  Assesment: He has acute on chronic respiratory failure. He has severe COPD. He did not have much infiltrate if any on chest x-ray and CT so I don't think pneumonia is playing a large role here and I have taken him off his IV antibiotics. He is on Cardizem continuous infusion because of episodes of atrial fibrillation. He has bruising on his abdomen. He does have a history of congestive heart  failure but has had good diuresis. Principal Problem:   Respiratory failure, acute Active Problems:   COPD exacerbation   Acute on chronic renal failure   Acute-on-chronic respiratory failure   Bilateral lower extremity edema   Obstructive sleep apnea   Fever   Acute respiratory failure   Acute on chronic diastolic heart failure   Hypokalemia    Plan: Continue with current medications and treatments. Attempt weaning again today. I discussed his situation with [facility] physician last night and if he doesn't appear that he's going to be able to wean he would be transferred to [facility]. Attempt weaning this morning and see how he does    LOS: 10 days   Collyn Ribas L 02/23/2013, 8:29 AM

## 2013-02-23 NOTE — Progress Notes (Signed)
Subjective: Interval History: none.  Objective: Vital signs in last 24 hours: Temp:  [97.5 F (36.4 C)-98.1 F (36.7 C)] 98.1 F (36.7 C) (12/16 0340) Pulse Rate:  [67-103] 69 (12/16 0600) Resp:  [13-20] 16 (12/16 0600) BP: (106-150)/(39-60) 112/52 mmHg (12/16 0600) SpO2:  [93 %-99 %] 96 % (12/16 0600) Arterial Line BP: (130-221)/(40-82) 188/54 mmHg (12/15 1800) FiO2 (%):  [40 %-45 %] 40 % (12/16 0600) Weight:  [101.5 kg (223 lb 12.3 oz)] 101.5 kg (223 lb 12.3 oz) (12/16 0500) Weight change: 0.9 kg (1 lb 15.8 oz)  Intake/Output from previous day: 12/15 0701 - 12/16 0700 In: 1724.1 [I.V.:1724.1] Out: 2250 [Urine:2250] Intake/Output this shift:    Generally patient is awake. Presently remains intubated and patient is able to comunicate with his hands. presently he doesn't seem to be in any apparent distress. Chest decreased breath sound anteriorly. He has some expiratory rhonchi. Heart exam revealed regular rate and rhythm no murmur Abdomen:Is  full, tympanic but nontender and positive bowel sound Extremities: He has 1+ edema bilaterally.  Lab Results:  Recent Labs  02/21/13 0504  WBC 11.0*  HGB 10.0*  HCT 33.2*  PLT 186   BMET:   Recent Labs  02/22/13 0431 02/23/13 0427  NA 149* 144  K 2.9* 3.9  CL 106 101  CO2 34* 33*  GLUCOSE 200* 200*  BUN 55* 47*  CREATININE 1.43* 1.32  CALCIUM 8.9 8.7   No results found for this basename: PTH,  in the last 72 hours Iron Studies: No results found for this basename: IRON, TIBC, TRANSFERRIN, FERRITIN,  in the last 72 hours  Studies/Results: No results found.  I have reviewed the patient's current medications.  Assessment/Plan: Problem #1 acute kidney injury : His BUN and creatinine has improved and returned to his base line  Problem #2 chronic renal failure . Patient with stage III chronic renal failure.  Problem #3 hypernatremia. Sodium is 149 worsening possibly from lack of free water. Patient is on D5 water. His  sodium has coreccted. Problem #4 hypokalemia: His potassium has coreccted.  Problem #5 respiratory failure. Presently patient is intubated. Problem #6 history of psoriasis Problem #7 hypertension: His blood pressures seem to be controlled. Problem #8 history of COPD/sleep apnea Problem# 9 CHF on lasix with good urine out put . Patient have about 2.2  L of urine output. Still he has some edema. Plan: Decrease ivf to 50 cc/hr           We'll continue with Lasix with present dose.            We'll check his basic metabolic panel in the morning.  LOS: 10 days   Leaha Cuervo S 02/23/2013,7:12 AM

## 2013-02-23 NOTE — Progress Notes (Signed)
At 0830hr RT with assistance replaced tube holder, tube holder was changed and resecured at 25cm at the right lip with out any complications noted.At 1000hr RT increased CPAP/PS to 12/5 for continued weaning. SBT terminated to decreased SATs.Patient tolerating well, RT continues to monitor

## 2013-02-23 NOTE — Progress Notes (Signed)
Nutrition Follow-up  INTERVENTION: Recommend  resume tube feeding at 50% previous rate if agreeable with MD.  Restart Continuous Vital HP @ 20 ml/hr via OGT.and titrate to goal of 40 ml/hr. Add ProStat 30 ml TID.   IVF-D5 @ 50 ml/hr   NUTRITION DIAGNOSIS: Inadequate oral intake related to inability to eat as evidenced by NPO status; ongoing.  Goal: Enteral nutrition to provide 60-70% of estimated calorie needs (22-25 kcals/kg ideal body weight or 1780-2022 kcal/day) and 100% of estimated protein needs, based on ASPEN guidelines for permissive underfeeding in critically ill obese individuals.  Goal; Not Met.  Monitor:  Respiratory status, nutrition support tolerance, labs, I/O's and wt changes  57 y.o. male  Admitting Dx: Respiratory failure, acute  ASSESSMENT: Patient continues on ventilator support. Enteral nutrition remains on hold. Discussed with nursing.  MV: 10.9 L/min Temp (24hrs), Avg:97.7 F (36.5 C), Min:97.2 F (36.2 C), Max:98.1 F (36.7 C)  Propofol: 0 ml/hr   Height: Ht Readings from Last 1 Encounters:  02/23/13 6' (1.829 m)    Weight: Wt Readings from Last 1 Encounters:  02/23/13 223 lb 12.3 oz (101.5 kg)    Ideal Body Weight: 178# (80.9 kg)  Wt Readings from Last 10 Encounters:  02/23/13 223 lb 12.3 oz (101.5 kg)  01/19/13 230 lb 4.8 oz (104.463 kg)  01/03/13 220 lb 14.4 oz (100.2 kg)  12/29/12 225 lb (102.059 kg)  12/16/12 224 lb 12 oz (101.946 kg)  12/01/12 227 lb 4.7 oz (103.1 kg)  10/07/12 230 lb (104.327 kg)  07/23/12 240 lb 1.3 oz (108.9 kg)  04/30/12 232 lb 12.8 oz (105.597 kg)  03/12/12 229 lb 15 oz (104.3 kg)    Usual Body Weight: 224-238#   BMI:  Body mass index is 30.34 kg/(m^2).obesity class I  Re-estimated Nutritional Needs: Kcal: 2038 Protein: 121-137 gr Fluid: >2200 ml daily  Skin:  Intact  BM: 02/23/13  Diet Order: NPO  EDUCATION NEEDS: -No education needs identified at this time   Intake/Output Summary (Last  24 hours) at 02/23/13 1516 Last data filed at 02/23/13 1500  Gross per 24 hour  Intake 2847.88 ml  Output   2250 ml  Net 597.88 ml    Labs:   Recent Labs Lab 02/18/13 0426 02/19/13 0416  02/21/13 0505 02/22/13 0431 02/23/13 0427  NA 146* 149*  < > 156* 149* 144  K 3.5 3.6  < > 3.5 2.9* 3.9  CL 104 108  < > 113* 106 101  CO2 26 30  < > 30 34* 33*  BUN 64* 69*  < > 64* 55* 47*  CREATININE 1.80* 1.57*  < > 1.54* 1.43* 1.32  CALCIUM 8.5  8.3* 8.7  < > 9.0 8.9 8.7  PHOS 5.4* 3.9  --   --   --   --   GLUCOSE 146* 168*  < > 188* 200* 200*  < > = values in this interval not displayed.  CBG (last 3)   Recent Labs  02/23/13 0313 02/23/13 0740 02/23/13 1122  GLUCAP 190* 217* 162*    Scheduled Meds: . ampicillin-sulbactam (UNASYN) IV  1.5 g Intravenous Q6H  . antiseptic oral rinse  15 mL Mouth Rinse QID  . chlorhexidine  15 mL Mouth/Throat BID  . furosemide  80 mg Intravenous Q12H  . heparin  5,000 Units Subcutaneous Q8H  . insulin aspart  0-20 Units Subcutaneous Q4H  . ipratropium  0.5 mg Nebulization Q4H  . levalbuterol  0.63 mg Nebulization Q4H  .  methylPREDNISolone (SOLU-MEDROL) injection  125 mg Intravenous Q6H  . pantoprazole (PROTONIX) IV  40 mg Intravenous QHS  . sodium chloride  10-40 mL Intracatheter Q12H    Continuous Infusions: . dextrose 5 % with KCl 20 mEq / L 20 mEq (02/23/13 1500)  . diltiazem (CARDIZEM) infusion 10 mg/hr (02/23/13 1310)  . fentaNYL infusion INTRAVENOUS 25 mcg/hr (02/23/13 1309)    Past Medical History  Diagnosis Date  . Hypertension   . Dyspnea   . Chronic kidney disease, stage 2, mildly decreased GFR     Creatinine of 1.31 in 04/2011  . COPD (chronic obstructive pulmonary disease)     moderate by spirometry in 08/2009;FEV1 of 1.1 ;nl alpha -1 antitrypsin   . Obesity   . Psoriasis   . Left eye trauma     with loss of vision  . Erectile dysfunction   . Lumbosacral spinal stenosis     chronic low back pain  . Headache(784.0)    . Sleep apnea     O2 at night  . Carotid artery aneurysm   . Asthma   . Psoriasis   . CHF (congestive heart failure)   . Renal insufficiency     Past Surgical History  Procedure Laterality Date  . Orif patella  12/14/2005    left fracture Dr.Harrison   . Ophthalmologic surgery  1963    secondary to left eye trauma, as a child hit in eye with tree limb   . Colonoscopy N/A 05/06/2012    RMR: internal hemorrhoids, tubular adenoma, cecal ulcerations with benign path, repeat in 2019  . Esophagogastroduodenoscopy  05/06/12    RMR: probably cervical esophageal web s/p 48 F dilation, hiatal hernia, antral and bulbar erosions with negative H.pylori    Royann Shivers MS,RD,CSG,LDN Office: [phone] Pager: [phone]

## 2013-02-24 ENCOUNTER — Inpatient Hospital Stay (HOSPITAL_COMMUNITY): Payer: Medicare HMO

## 2013-02-24 LAB — BASIC METABOLIC PANEL
CO2: 32 mEq/L (ref 19–32)
Calcium: 8.7 mg/dL (ref 8.4–10.5)
Chloride: 100 mEq/L (ref 96–112)
Creatinine, Ser: 1.48 mg/dL — ABNORMAL HIGH (ref 0.50–1.35)
Glucose, Bld: 196 mg/dL — ABNORMAL HIGH (ref 70–99)
Potassium: 3.8 mEq/L (ref 3.5–5.1)
Sodium: 143 mEq/L (ref 135–145)

## 2013-02-24 LAB — BLOOD GAS, ARTERIAL
Acid-Base Excess: 6.6 mmol/L — ABNORMAL HIGH (ref 0.0–2.0)
Drawn by: 213101
FIO2: 45 %
O2 Saturation: 97.8 %
RATE: 18 resp/min
TCO2: 27.1 mmol/L (ref 0–100)
pCO2 arterial: 45.3 mmHg — ABNORMAL HIGH (ref 35.0–45.0)
pO2, Arterial: 99.3 mmHg (ref 80.0–100.0)

## 2013-02-24 LAB — GLUCOSE, CAPILLARY
Glucose-Capillary: 159 mg/dL — ABNORMAL HIGH (ref 70–99)
Glucose-Capillary: 178 mg/dL — ABNORMAL HIGH (ref 70–99)
Glucose-Capillary: 188 mg/dL — ABNORMAL HIGH (ref 70–99)
Glucose-Capillary: 214 mg/dL — ABNORMAL HIGH (ref 70–99)
Glucose-Capillary: 214 mg/dL — ABNORMAL HIGH (ref 70–99)

## 2013-02-24 LAB — CLOSTRIDIUM DIFFICILE BY PCR: Toxigenic C. Difficile by PCR: POSITIVE — AB

## 2013-02-24 MED ORDER — PRO-STAT SUGAR FREE PO LIQD
30.0000 mL | Freq: Three times a day (TID) | ORAL | Status: DC
  Administered 2013-02-24 (×2): 30 mL
  Filled 2013-02-24 (×5): qty 30

## 2013-02-24 MED ORDER — METRONIDAZOLE 50 MG/ML ORAL SUSPENSION
500.0000 mg | Freq: Three times a day (TID) | ORAL | Status: DC
  Administered 2013-02-24 – 2013-02-25 (×5): 500 mg via ORAL
  Filled 2013-02-24 (×11): qty 10

## 2013-02-24 MED ORDER — KCL IN DEXTROSE-NACL 10-5-0.45 MEQ/L-%-% IV SOLN
INTRAVENOUS | Status: DC
  Administered 2013-02-24 – 2013-02-25 (×3): via INTRAVENOUS
  Filled 2013-02-24 (×6): qty 1000

## 2013-02-24 NOTE — Progress Notes (Signed)
[name]  MRN: [id number]  DOB/AGE: 1955-09-08 57 y.o.  Primary Care Physician:HAWKINS,EDWARD L, MD  Admit date: 02/16/2013  Chief Complaint:  Chief Complaint  Patient presents with  . Shortness of Breath    [name]-Pt presented on  02/18/2013 with  Chief Complaint  Patient presents with  . Shortness of Breath  .   Pt still intubated but not sedated.   Pt asked question by hand communication.   Pt was on bed pan w diarrhea   Meds . ampicillin-sulbactam (UNASYN) IV  1.5 g Intravenous Q6H  . antiseptic oral rinse  15 mL Mouth Rinse QID  . chlorhexidine  15 mL Mouth/Throat BID  . furosemide  80 mg Intravenous Q12H  . insulin aspart  0-20 Units Subcutaneous Q4H  . ipratropium  0.5 mg Nebulization Q4H  . levalbuterol  0.63 mg Nebulization Q4H  . methylPREDNISolone (SOLU-MEDROL) injection  125 mg Intravenous Q6H  . pantoprazole (PROTONIX) IV  40 mg Intravenous QHS  . sodium chloride  10-40 mL Intracatheter Q12H       Physical Exam: Vital signs in last 24 hours: Temp:  [97 F (36.1 C)-98.3 F (36.8 C)] 97.6 F (36.4 C) (12/17 0800) Pulse Rate:  [78-102] 98 (12/17 0833) Resp:  [12-19] 16 (12/17 0833) BP: (112-151)/(46-65) 118/55 mmHg (12/17 0832) SpO2:  [91 %-98 %] 96 % (12/17 0833) FiO2 (%):  [45 %-100 %] 60 % (12/17 0833) Weight:  [229 lb 15 oz (104.3 kg)] 229 lb 15 oz (104.3 kg) (12/17 0630) Weight change: 6 lb 2.8 oz (2.8 kg) Last BM Date: 02/24/13  Intake/Output from previous day: 12/16 0701 - 12/17 0700 In: 1895.4 [I.V.:1515.4; NG/GT:180; IV Piggyback:200] Out: 1475 [Urine:1475]     Physical Exam: General- pt is awake,alert. Resp- ET tube in situ, B/l breath sounds +. CVS- S1S2 regular in rate and rhythm GIT- BS+, soft, NT, ND EXT- NO LE Edema, Cyanosis   Lab Results: CBC  Recent Labs  02/23/13 0426  WBC 15.9*  HGB 11.3*  HCT 36.7*  PLT 213    BMET  Recent Labs  02/23/13 0427 02/24/13 0443  NA 144 143  K 3.9 3.8  CL 101 100   CO2 33* 32  GLUCOSE 200* 196*  BUN 47* 57*  CREATININE 1.32 1.48*  CALCIUM 8.7 8.7   Creat Trend  2014 1.58==>2.29==>1.57==>1.32=>1.48  1.3--1.7 baseline  2013 1.3--1.6 ( Peak 6.20 AKI )  2012 1.2--1.6    MICRO Recent Results (from the past 240 hour([name]))  CULTURE, RESPIRATORY (NON-EXPECTORATED)     Status: None   Collection Time    02/14/13  2:00 PM      Result Value Range Status   Specimen Description ASPIRATE   Final   Special Requests NONE   Final   Gram Stain     Final   Value: ABUNDANT WBC PRESENT, PREDOMINANTLY PMN     FEW SQUAMOUS EPITHELIAL CELLS PRESENT     FEW GRAM POSITIVE COCCI IN PAIRS     RARE GRAM NEGATIVE RODS     Performed at [facility]   Culture     Final   Value: Non-Pathogenic Oropharyngeal-type Flora Isolated.     Performed at [facility]   Report Status 02/16/2013 FINAL   Final  URINE CULTURE     Status: None   Collection Time    02/16/13 10:25 AM      Result Value Range Status   Specimen Description URINE, CATHETERIZED   Final   Special Requests  NONE   Final   Culture  Setup Time     Final   Value: 02/16/2013 14:29     Performed at [facility] Count     Final   Value: NO GROWTH     Performed at [facility]   Culture     Final   Value: NO GROWTH     Performed at [facility]   Report Status 02/17/2013 FINAL   Final      Lab Results  Component Value Date   PTH 248.0* 02/18/2013   CALCIUM 8.7 02/24/2013   PHOS 3.9 02/19/2013       Impression: 1)Renal AKI secondary to ATN /Prerenal NON oliguric ATN  AKI on CKD  AKI sec to Hypotension + ACE + vanco ? AKI was much better. Today some rise in creat most likely sec to fluid loss from diarrhea  Creat nearly back to baseline  CKD stage 3 .  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 Secondary Hyperparathyroidism present Phosphorus at goal.   5)REsp- admited with resp failure  Remains Intubated  Primary MD following  6)Electrolytes Normokalemic  Hypernatremic- now better   7)Acid base Co2 at goal     Plan:  Will reduce lasix to 40mg  IV BId if creat still higher in am  Will increase IVF for now ( d5 to d5+ 1/2 Ns with 10 kcl)     [name] 02/24/2013, 8:47 AM

## 2013-02-24 NOTE — Progress Notes (Signed)
CRITICAL VALUE ALERT  Critical value received:  C-Diff PCR +  Date of notification:  02/24/2013  Time of notification:  1230  Critical value read back:yes  Nurse who received alert:  A.Kendrick Fries  MD notified (1st page):  Juanetta Gosling  Time of first page:  1235  MD notified (2nd page):  Time of second page:  Responding MD: Juanetta Gosling  Time MD responded:  1245-orders given to consult pharmacy for Vancomycin dosing per tube due to renal function.

## 2013-02-24 NOTE — Progress Notes (Signed)
Nutrition Follow-up  INTERVENTION: Continuous Vital AF @ 40 ml/hr via OGT. Provides 1152 kcal, 72 gr protein, 778 ml water.  Add ProStat 30 ml TID (300 kcal, 45 gr protein).  IVF-D5 =-1/2 NS @ 100 ml/hr = 2400 ml and 408 kcal q 24 hrs  NUTRITION DIAGNOSIS: Inadequate oral intake related to inability to eat as evidenced by NPO status; ongoing.  Goal: Enteral nutrition to provide 60-70% of estimated calorie needs (22-25 kcals/kg ideal body weight or 1780-2022 kcal/day) and 100% of estimated protein needs, based on ASPEN guidelines for permissive underfeeding in critically ill obese individuals.  Goal; progressing.  Monitor:  Respiratory status, nutrition support tolerance, labs, I/O's and wt changes  57 y.o. male  Admitting Dx: Respiratory failure, acute  ASSESSMENT: Patient continues on ventilator support. Pt tolerating TF with 5 ml residual. C. Diff PCR- result positive. Started on Vancomycin. Will add protein modular to assist with meeting his increased protein needs. With protein modular and D5, nutrition support will provide 1860 kcal, 117 gr protein which will meet 100% est energy and 97% minimum est protein needs.  MV: 11.5 L/min Temp (24hrs), Avg:97.9 F (36.6 C), Min:97 F (36.1 C), Max:98.5 F (36.9 C)  Propofol: 0 ml/hr   Height: Ht Readings from Last 1 Encounters:  02/23/13 6' (1.829 m)    Weight: Wt Readings from Last 1 Encounters:  02/24/13 229 lb 15 oz (104.3 kg)    Ideal Body Weight: 178# (80.9 kg)  Wt Readings from Last 10 Encounters:  02/24/13 229 lb 15 oz (104.3 kg)  01/19/13 230 lb 4.8 oz (104.463 kg)  01/03/13 220 lb 14.4 oz (100.2 kg)  12/29/12 225 lb (102.059 kg)  12/16/12 224 lb 12 oz (101.946 kg)  12/01/12 227 lb 4.7 oz (103.1 kg)  10/07/12 230 lb (104.327 kg)  07/23/12 240 lb 1.3 oz (108.9 kg)  04/30/12 232 lb 12.8 oz (105.597 kg)  03/12/12 229 lb 15 oz (104.3 kg)    Usual Body Weight: 224-238#   BMI:  Body mass index is 31.18  kg/(m^2).obesity class I  Re-estimated Nutritional Needs: Kcal: 2038 Protein: 121-137 gr Fluid: >2200 ml daily  Skin:  Intact  BM: 02/24/13  Diet Order: NPO  EDUCATION NEEDS: -No education needs identified at this time   Intake/Output Summary (Last 24 hours) at 02/24/13 1358 Last data filed at 02/24/13 0821  Gross per 24 hour  Intake 1606.26 ml  Output   1475 ml  Net 131.26 ml    Labs:   Recent Labs Lab 02/18/13 0426 02/19/13 0416  02/22/13 0431 02/23/13 0427 02/24/13 0443  NA 146* 149*  < > 149* 144 143  K 3.5 3.6  < > 2.9* 3.9 3.8  CL 104 108  < > 106 101 100  CO2 26 30  < > 34* 33* 32  BUN 64* 69*  < > 55* 47* 57*  CREATININE 1.80* 1.57*  < > 1.43* 1.32 1.48*  CALCIUM 8.5  8.3* 8.7  < > 8.9 8.7 8.7  PHOS 5.4* 3.9  --   --   --   --   GLUCOSE 146* 168*  < > 200* 200* 196*  < > = values in this interval not displayed.  CBG (last 3)   Recent Labs  02/24/13 0410 02/24/13 0740 02/24/13 1137  GLUCAP 188* 179* 178*    Scheduled Meds: . ampicillin-sulbactam (UNASYN) IV  1.5 g Intravenous Q6H  . antiseptic oral rinse  15 mL Mouth Rinse QID  . chlorhexidine  15 mL Mouth/Throat BID  . furosemide  80 mg Intravenous Q12H  . insulin aspart  0-20 Units Subcutaneous Q4H  . ipratropium  0.5 mg Nebulization Q4H  . levalbuterol  0.63 mg Nebulization Q4H  . methylPREDNISolone (SOLU-MEDROL) injection  125 mg Intravenous Q6H  . metroNIDAZOLE  500 mg Oral TID  . pantoprazole (PROTONIX) IV  40 mg Intravenous QHS  . sodium chloride  10-40 mL Intracatheter Q12H    Continuous Infusions: . dextrose 5 % and 0.45 % NaCl with KCl 10 mEq/L 100 mL/hr at 02/24/13 1048  . diltiazem (CARDIZEM) infusion 15 mg/hr (02/24/13 1250)  . feeding supplement (VITAL AF 1.2 CAL) 1,000 mL (02/24/13 0715)  . fentaNYL infusion INTRAVENOUS 50 mcg/hr (02/24/13 1241)    Past Medical History  Diagnosis Date  . Hypertension   . Dyspnea   . Chronic kidney disease, stage 2, mildly  decreased GFR     Creatinine of 1.31 in 04/2011  . COPD (chronic obstructive pulmonary disease)     moderate by spirometry in 08/2009;FEV1 of 1.1 ;nl alpha -1 antitrypsin   . Obesity   . Psoriasis   . Left eye trauma     with loss of vision  . Erectile dysfunction   . Lumbosacral spinal stenosis     chronic low back pain  . Headache(784.0)   . Sleep apnea     O2 at night  . Carotid artery aneurysm   . Asthma   . Psoriasis   . CHF (congestive heart failure)   . Renal insufficiency     Past Surgical History  Procedure Laterality Date  . Orif patella  12/14/2005    left fracture Dr.Harrison   . Ophthalmologic surgery  1963    secondary to left eye trauma, as a child hit in eye with tree limb   . Colonoscopy N/A 05/06/2012    RMR: internal hemorrhoids, tubular adenoma, cecal ulcerations with benign path, repeat in 2019  . Esophagogastroduodenoscopy  05/06/12    RMR: probably cervical esophageal web s/p 72 F dilation, hiatal hernia, antral and bulbar erosions with negative H.pylori    Royann Shivers MS,RD,CSG,LDN Office: [phone] Pager: [phone]

## 2013-02-24 NOTE — Progress Notes (Signed)
Placed Pt on TBAR at 0825 with 100% his HR 102, and BP 118/59 and RR 16 and pt isnt in ditress and says he is comfortabel.

## 2013-02-24 NOTE — Progress Notes (Signed)
Subjective: He says he feels better. He looks better. We discussed transfer yesterday and he does not want to be transferred from here.  Objective: Vital signs in last 24 hours: Temp:  [97 F (36.1 C)-98.3 F (36.8 C)] 97.6 F (36.4 C) (12/17 0800) Pulse Rate:  [78-102] 98 (12/17 0833) Resp:  [12-19] 16 (12/17 0833) BP: (112-151)/(46-65) 118/55 mmHg (12/17 0832) SpO2:  [91 %-98 %] 96 % (12/17 0833) FiO2 (%):  [45 %-100 %] 60 % (12/17 0833) Weight:  [104.3 kg (229 lb 15 oz)] 104.3 kg (229 lb 15 oz) (12/17 0630) Weight change: 2.8 kg (6 lb 2.8 oz) Last BM Date: 02/24/13  Intake/Output from previous day: 12/16 0701 - 12/17 0700 In: 1745.4 [I.V.:1365.4; NG/GT:180; IV Piggyback:200] Out: 1475 [Urine:1475]  PHYSICAL EXAM General appearance: alert, cooperative and mild distress Resp: rhonchi bilaterally Cardio: regular rate and rhythm, S1, S2 normal, no murmur, click, rub or gallop GI: soft, non-tender; bowel sounds normal; no masses,  no organomegaly Extremities: extremities normal, atraumatic, no cyanosis or edema  Lab Results:    Basic Metabolic Panel:  Recent Labs  16/10/96 0427 02/24/13 0443  NA 144 143  K 3.9 3.8  CL 101 100  CO2 33* 32  GLUCOSE 200* 196*  BUN 47* 57*  CREATININE 1.32 1.48*  CALCIUM 8.7 8.7   Liver Function Tests: No results found for this basename: AST, ALT, ALKPHOS, BILITOT, PROT, ALBUMIN,  in the last 72 hours No results found for this basename: LIPASE, AMYLASE,  in the last 72 hours No results found for this basename: AMMONIA,  in the last 72 hours CBC:  Recent Labs  02/23/13 0426  WBC 15.9*  NEUTROABS 14.5*  HGB 11.3*  HCT 36.7*  MCV 100.0  PLT 213   Cardiac Enzymes: No results found for this basename: CKTOTAL, CKMB, CKMBINDEX, TROPONINI,  in the last 72 hours BNP: No results found for this basename: PROBNP,  in the last 72 hours D-Dimer: No results found for this basename: DDIMER,  in the last 72 hours CBG:  Recent Labs   02/23/13 0740 02/23/13 1122 02/23/13 1648 02/23/13 1950 02/24/13 0013 02/24/13 0410  GLUCAP 217* 162* 179* 162* 159* 188*   Hemoglobin A1C: No results found for this basename: HGBA1C,  in the last 72 hours Fasting Lipid Panel: No results found for this basename: CHOL, HDL, LDLCALC, TRIG, CHOLHDL, LDLDIRECT,  in the last 72 hours Thyroid Function Tests: No results found for this basename: TSH, T4TOTAL, FREET4, T3FREE, THYROIDAB,  in the last 72 hours Anemia Panel: No results found for this basename: VITAMINB12, FOLATE, FERRITIN, TIBC, IRON, RETICCTPCT,  in the last 72 hours Coagulation: No results found for this basename: LABPROT, INR,  in the last 72 hours Urine Drug Screen: Drugs of Abuse     Component Value Date/Time   LABOPIA NONE DETECTED 03/01/2012 0019   COCAINSCRNUR POSITIVE* 03/01/2012 0019   LABBENZ NONE DETECTED 03/01/2012 0019   AMPHETMU NONE DETECTED 03/01/2012 0019   THCU NONE DETECTED 03/01/2012 0019   LABBARB NONE DETECTED 03/01/2012 0019    Alcohol Level: No results found for this basename: ETH,  in the last 72 hours Urinalysis: No results found for this basename: COLORURINE, APPERANCEUR, LABSPEC, PHURINE, GLUCOSEU, HGBUR, BILIRUBINUR, KETONESUR, PROTEINUR, UROBILINOGEN, NITRITE, LEUKOCYTESUR,  in the last 72 hours Misc. Labs:  ABGS  Recent Labs  02/24/13 0445  PHART 7.446  PO2ART 99.3  TCO2 27.1  HCO3 30.7*   CULTURES Recent Results (from the past 240 hour(s))  CULTURE, RESPIRATORY (  NON-EXPECTORATED)     Status: None   Collection Time    02/14/13  2:00 PM      Result Value Range Status   Specimen Description ASPIRATE   Final   Special Requests NONE   Final   Gram Stain     Final   Value: ABUNDANT WBC PRESENT, PREDOMINANTLY PMN     FEW SQUAMOUS EPITHELIAL CELLS PRESENT     FEW GRAM POSITIVE COCCI IN PAIRS     RARE GRAM NEGATIVE RODS     Performed at [facility]   Culture     Final   Value: Non-Pathogenic Oropharyngeal-type Flora  Isolated.     Performed at [facility]   Report Status 02/16/2013 FINAL   Final  URINE CULTURE     Status: None   Collection Time    02/16/13 10:25 AM      Result Value Range Status   Specimen Description URINE, CATHETERIZED   Final   Special Requests NONE   Final   Culture  Setup Time     Final   Value: 02/16/2013 14:29     Performed at [facility]   Colony Count     Final   Value: NO GROWTH     Performed at [facility]   Culture     Final   Value: NO GROWTH     Performed at [facility]   Report Status 02/17/2013 FINAL   Final   Studies/Results: Dg Chest 1 View  02/23/2013   CLINICAL DATA:  Respiratory failure  EXAM: CHEST - 1 VIEW  COMPARISON:  February 19, 2013 chest radiograph and chest CT  FINDINGS: There is underlying emphysema. There is patchy infiltrate in the right base. Elsewhere lungs are clear. Heart is mildly enlarged with normal pulmonary vascularity. Central catheter tip is in the superior vena cava. No pneumothorax. No adenopathy.  IMPRESSION: Right base infiltrate.  Underlying emphysema.  No pneumothorax.   Electronically Signed   By: Bretta Bang M.D.   On: 02/23/2013 10:02   Dg Chest Port 1 View  02/24/2013   CLINICAL DATA:  Respiratory failure.  EXAM: PORTABLE CHEST - 1 VIEW  COMPARISON:  02/23/2013  FINDINGS: Endotracheal tube is 3.5 cm above the carina. PICC line tip in the SVC region. Chronic lung changes consistent with emphysema. There continues to be densities at the right lung base but slightly improved aeration compared to the prior examination. No evidence for a pneumothorax.  IMPRESSION: Slightly improved aeration at the right lung base.  Chronic lung changes.   Electronically Signed   By: Richarda Overlie M.D.   On: 02/24/2013 08:03    Medications:  Prior to Admission:  Prescriptions prior to admission  Medication Sig Dispense Refill  . acetaminophen-codeine (TYLENOL #3) 300-30 MG per tablet Take 1 tablet by mouth  every 4 (four) hours as needed for pain.  60 tablet  5  . albuterol (PROVENTIL HFA;VENTOLIN HFA) 108 (90 BASE) MCG/ACT inhaler Inhale 2 puffs into the lungs as needed for wheezing (rescue inhaler).       Marland Kitchen albuterol (PROVENTIL) (2.5 MG/3ML) 0.083% nebulizer solution Take 2.5 mg by nebulization every 6 (six) hours as needed for wheezing or shortness of breath.      . cloNIDine (CATAPRES) 0.2 MG tablet Take 0.2 mg by mouth 3 (three) times daily.      Marland Kitchen docusate sodium (COLACE) 100 MG capsule Take 100 mg by mouth 2 (two) times daily as needed  for mild constipation.      . furosemide (LASIX) 40 MG tablet Take 1 tablet (40 mg total) by mouth 2 (two) times daily.  30 tablet  5  . guaiFENesin (MUCINEX) 600 MG 12 hr tablet Take 1,200 mg by mouth at bedtime as needed for to loosen phlegm.       Marland Kitchen HYDROcodone-acetaminophen (NORCO/VICODIN) 5-325 MG per tablet Take 1 tablet by mouth every 6 (six) hours as needed for pain.  120 tablet  0  . methotrexate (RHEUMATREX) 2.5 MG tablet Take 10 mg by mouth once a week. Saturday      . mometasone-formoterol (DULERA) 200-5 MCG/ACT AERO Inhale 2 puffs into the lungs 2 (two) times daily.  1 Inhaler  12  . pantoprazole (PROTONIX) 40 MG tablet Take 1 tablet (40 mg total) by mouth 2 (two) times daily.  60 tablet  12  . predniSONE (DELTASONE) 10 MG tablet Take 10 mg by mouth 2 (two) times daily.      . ranitidine (ZANTAC) 300 MG tablet Take 300 mg by mouth at bedtime.       Scheduled: . ampicillin-sulbactam (UNASYN) IV  1.5 g Intravenous Q6H  . antiseptic oral rinse  15 mL Mouth Rinse QID  . chlorhexidine  15 mL Mouth/Throat BID  . furosemide  80 mg Intravenous Q12H  . insulin aspart  0-20 Units Subcutaneous Q4H  . ipratropium  0.5 mg Nebulization Q4H  . levalbuterol  0.63 mg Nebulization Q4H  . methylPREDNISolone (SOLU-MEDROL) injection  125 mg Intravenous Q6H  . pantoprazole (PROTONIX) IV  40 mg Intravenous QHS  . sodium chloride  10-40 mL Intracatheter Q12H    Continuous: . dextrose 5 % with KCl 20 mEq / L 20 mEq (02/24/13 0400)  . diltiazem (CARDIZEM) infusion 10 mg/hr (02/24/13 0116)  . feeding supplement (VITAL AF 1.2 CAL) 1,000 mL (02/24/13 0821)  . fentaNYL infusion INTRAVENOUS Stopped (02/24/13 1478)   GNF:AOZHYQ chloride, fentaNYL, labetalol, LORazepam, sodium chloride  Assesment: He has acute on chronic respiratory failure. He has COPD exacerbation. A new finding was that he has an infiltrate on chest x-ray now and I think that he aspirated for 5 days ago. He had very high residuals from his tube feedings and then had what appeared to be an episode of potential aspiration. He appears to be better and did better with weaning yesterday. Principal Problem:   Respiratory failure, acute Active Problems:   COPD exacerbation   Acute on chronic renal failure   Acute-on-chronic respiratory failure   Bilateral lower extremity edema   Obstructive sleep apnea   Fever   Acute respiratory failure   Acute on chronic diastolic heart failure   Hypokalemia    Plan: Continue current treatments. Try weaning again today.    LOS: 11 days   Deashia Soule L 02/24/2013, 8:40 AM

## 2013-02-25 ENCOUNTER — Inpatient Hospital Stay (HOSPITAL_COMMUNITY): Payer: Medicare HMO

## 2013-02-25 DIAGNOSIS — J9620 Acute and chronic respiratory failure, unspecified whether with hypoxia or hypercapnia: Secondary | ICD-10-CM

## 2013-02-25 LAB — BLOOD GAS, ARTERIAL
Bicarbonate: 26.3 mEq/L — ABNORMAL HIGH (ref 20.0–24.0)
Drawn by: 25788
Drawn by: 213101
FIO2: 60 %
MECHVT: 620 mL
O2 Saturation: 93.8 %
PEEP: 5 cmH2O
Patient temperature: 37
TCO2: 27.4 mmol/L (ref 0–100)
TCO2: 23.3 mmol/L (ref 0–100)
pCO2 arterial: 54.7 mmHg — ABNORMAL HIGH (ref 35.0–45.0)
pCO2 arterial: 54 mmHg — ABNORMAL HIGH (ref 35.0–45.0)
pH, Arterial: 7.372 (ref 7.350–7.450)
pO2, Arterial: 75.8 mmHg — ABNORMAL LOW (ref 80.0–100.0)
pO2, Arterial: 79.4 mmHg — ABNORMAL LOW (ref 80.0–100.0)

## 2013-02-25 LAB — CBC WITH DIFFERENTIAL/PLATELET
Eosinophils Relative: 0 % (ref 0–5)
HCT: 47 % (ref 39.0–52.0)
Hemoglobin: 14.7 g/dL (ref 13.0–17.0)
Lymphocytes Relative: 2 % — ABNORMAL LOW (ref 12–46)
Lymphs Abs: 0.5 10*3/uL — ABNORMAL LOW (ref 0.7–4.0)
MCH: 30.7 pg (ref 26.0–34.0)
MCV: 98.1 fL (ref 78.0–100.0)
Monocytes Absolute: 0.7 10*3/uL (ref 0.1–1.0)
Monocytes Relative: 3 % (ref 3–12)
Neutrophils Relative %: 94 % — ABNORMAL HIGH (ref 43–77)
Platelets: 219 10*3/uL (ref 150–400)
RBC: 4.79 MIL/uL (ref 4.22–5.81)
WBC Morphology: INCREASED
WBC: 22.6 10*3/uL — ABNORMAL HIGH (ref 4.0–10.5)

## 2013-02-25 LAB — CULTURE, RESPIRATORY

## 2013-02-25 LAB — BASIC METABOLIC PANEL
BUN: 78 mg/dL — ABNORMAL HIGH (ref 6–23)
CO2: 27 mEq/L (ref 19–32)
Calcium: 8.6 mg/dL (ref 8.4–10.5)
Chloride: 96 mEq/L (ref 96–112)
Creatinine, Ser: 2.33 mg/dL — ABNORMAL HIGH (ref 0.50–1.35)
GFR calc Af Amer: 34 mL/min — ABNORMAL LOW (ref >90)
GFR calc non Af Amer: 29 mL/min — ABNORMAL LOW (ref >90)
Glucose, Bld: 189 mg/dL — ABNORMAL HIGH (ref 70–99)
Potassium: 4.5 mEq/L (ref 3.5–5.1)
Sodium: 140 mEq/L (ref 135–145)

## 2013-02-25 LAB — CULTURE, RESPIRATORY W GRAM STAIN

## 2013-02-25 LAB — GLUCOSE, CAPILLARY
Glucose-Capillary: 201 mg/dL — ABNORMAL HIGH (ref 70–99)
Glucose-Capillary: 150 mg/dL — ABNORMAL HIGH (ref 70–99)
Glucose-Capillary: 166 mg/dL — ABNORMAL HIGH (ref 70–99)

## 2013-02-25 LAB — MRSA PCR SCREENING: MRSA by PCR: NEGATIVE

## 2013-02-25 MED ORDER — METOCLOPRAMIDE HCL 5 MG/ML IJ SOLN
5.0000 mg | Freq: Four times a day (QID) | INTRAMUSCULAR | Status: DC
  Administered 2013-02-25 – 2013-02-26 (×5): 5 mg via INTRAVENOUS
  Filled 2013-02-25 (×6): qty 1
  Filled 2013-02-25: qty 2
  Filled 2013-02-25 (×4): qty 1

## 2013-02-25 MED ORDER — SODIUM CHLORIDE 0.9 % IV BOLUS (SEPSIS)
500.0000 mL | Freq: Once | INTRAVENOUS | Status: AC
  Administered 2013-02-25: 500 mL via INTRAVENOUS

## 2013-02-25 MED ORDER — METHYLPREDNISOLONE SODIUM SUCC 125 MG IJ SOLR
80.0000 mg | Freq: Two times a day (BID) | INTRAMUSCULAR | Status: DC
  Administered 2013-02-25: 80 mg via INTRAVENOUS
  Filled 2013-02-25 (×3): qty 1.28

## 2013-02-25 MED ORDER — VITAL HIGH PROTEIN PO LIQD
1000.0000 mL | ORAL | Status: DC
  Filled 2013-02-25 (×4): qty 1000

## 2013-02-25 MED ORDER — PROPOFOL 10 MG/ML IV EMUL: 5.0000 ug/kg/min | INTRAVENOUS | Status: DC

## 2013-02-25 MED ORDER — SODIUM CHLORIDE 0.9 % IV SOLN: INTRAVENOUS | Status: DC

## 2013-02-25 MED ORDER — SODIUM CHLORIDE 0.9 % IV SOLN
INTRAVENOUS | Status: DC
  Administered 2013-02-25 – 2013-02-27 (×3): via INTRAVENOUS

## 2013-02-25 MED ORDER — ACETAMINOPHEN 160 MG/5ML PO SOLN
650.0000 mg | ORAL | Status: DC | PRN
  Administered 2013-02-25: 650 mg
  Filled 2013-02-25 (×2): qty 20.3

## 2013-02-25 MED ORDER — PROPOFOL 10 MG/ML IV EMUL
5.0000 ug/kg/min | INTRAVENOUS | Status: DC
  Administered 2013-02-25 (×2): 35 ug/kg/min via INTRAVENOUS
  Administered 2013-02-25: 30 ug/kg/min via INTRAVENOUS
  Administered 2013-02-26 (×2): 35 ug/kg/min via INTRAVENOUS
  Administered 2013-02-26: 30 ug/kg/min via INTRAVENOUS
  Administered 2013-02-27: 35 ug/kg/min via INTRAVENOUS
  Administered 2013-02-27: 29.654 ug/kg/min via INTRAVENOUS
  Administered 2013-02-27 (×2): 40 ug/kg/min via INTRAVENOUS
  Filled 2013-02-25 (×11): qty 100

## 2013-02-25 MED ORDER — PRO-STAT SUGAR FREE PO LIQD
30.0000 mL | Freq: Four times a day (QID) | ORAL | Status: DC
  Administered 2013-02-25 – 2013-02-27 (×7): 30 mL
  Filled 2013-02-25 (×10): qty 30

## 2013-02-25 NOTE — Progress Notes (Signed)
Subjective: He is overall worse today. She's more agitated his heart rate is up and he is not tolerating tube feedings. He has Clostridium difficile which is being treated with Flagyl.  Objective: Vital signs in last 24 hours: Temp:  [97.1 F (36.2 C)-98.5 F (36.9 C)] 97.3 F (36.3 C) (12/18 0400) Pulse Rate:  [96-125] 119 (12/18 0600) Resp:  [15-25] 18 (12/17 1900) BP: (116-178)/(55-75) 148/70 mmHg (12/18 0600) SpO2:  [92 %-99 %] 94 % (12/18 0716) FiO2 (%):  [45 %-100 %] 45 % (12/18 0759) Weight:  [105.1 kg (231 lb 11.3 oz)] 105.1 kg (231 lb 11.3 oz) (12/18 0500) Weight change: 0.8 kg (1 lb 12.2 oz) Last BM Date: 02/24/13  Intake/Output from previous day: 12/17 0701 - 12/18 0700 In: 3496.4 [I.V.:2450.8; NG/GT:845.7; IV Piggyback:200] Out: 1200 [Urine:1200]  PHYSICAL EXAM General appearance: alert, severe distress and morbidly obese Resp: rhonchi bilaterally Cardio: regular rate and rhythm, S1, S2 normal, no murmur, click, rub or gallop GI: His abdomen is somewhat distended. Extremities: extremities normal, atraumatic, no cyanosis or edema  Lab Results:    Basic Metabolic Panel:  Recent Labs  96/04/54 0443 02/25/13 0409  NA 143 140  K 3.8 4.5  CL 100 96  CO2 32 27  GLUCOSE 196* 189*  BUN 57* 78*  CREATININE 1.48* 2.33*  CALCIUM 8.7 8.6   Liver Function Tests: No results found for this basename: AST, ALT, ALKPHOS, BILITOT, PROT, ALBUMIN,  in the last 72 hours No results found for this basename: LIPASE, AMYLASE,  in the last 72 hours No results found for this basename: AMMONIA,  in the last 72 hours CBC:  Recent Labs  02/23/13 0426  WBC 15.9*  NEUTROABS 14.5*  HGB 11.3*  HCT 36.7*  MCV 100.0  PLT 213   Cardiac Enzymes: No results found for this basename: CKTOTAL, CKMB, CKMBINDEX, TROPONINI,  in the last 72 hours BNP: No results found for this basename: PROBNP,  in the last 72 hours D-Dimer: No results found for this basename: DDIMER,  in the last  72 hours CBG:  Recent Labs  02/24/13 1137 02/24/13 1610 02/24/13 1931 02/24/13 2303 02/25/13 0322 02/25/13 0733  GLUCAP 178* 214* 214* 201* 198* 174*   Hemoglobin A1C: No results found for this basename: HGBA1C,  in the last 72 hours Fasting Lipid Panel: No results found for this basename: CHOL, HDL, LDLCALC, TRIG, CHOLHDL, LDLDIRECT,  in the last 72 hours Thyroid Function Tests: No results found for this basename: TSH, T4TOTAL, FREET4, T3FREE, THYROIDAB,  in the last 72 hours Anemia Panel: No results found for this basename: VITAMINB12, FOLATE, FERRITIN, TIBC, IRON, RETICCTPCT,  in the last 72 hours Coagulation: No results found for this basename: LABPROT, INR,  in the last 72 hours Urine Drug Screen: Drugs of Abuse     Component Value Date/Time   LABOPIA NONE DETECTED 03/01/2012 0019   COCAINSCRNUR POSITIVE* 03/01/2012 0019   LABBENZ NONE DETECTED 03/01/2012 0019   AMPHETMU NONE DETECTED 03/01/2012 0019   THCU NONE DETECTED 03/01/2012 0019   LABBARB NONE DETECTED 03/01/2012 0019    Alcohol Level: No results found for this basename: ETH,  in the last 72 hours Urinalysis: No results found for this basename: COLORURINE, APPERANCEUR, LABSPEC, PHURINE, GLUCOSEU, HGBUR, BILIRUBINUR, KETONESUR, PROTEINUR, UROBILINOGEN, NITRITE, LEUKOCYTESUR,  in the last 72 hours Misc. Labs:  ABGS  Recent Labs  02/25/13 0625  PHART 7.308*  PO2ART 79.4*  TCO2 23.3  HCO3 26.3*   CULTURES Recent Results (from the past 240  hour(s))  URINE CULTURE     Status: None   Collection Time    02/16/13 10:25 AM      Result Value Range Status   Specimen Description URINE, CATHETERIZED   Final   Special Requests NONE   Final   Culture  Setup Time     Final   Value: 02/16/2013 14:29     Performed at [facility]   Colony Count     Final   Value: NO GROWTH     Performed at [facility]   Culture     Final   Value: NO GROWTH     Performed at [facility]   Report  Status 02/17/2013 FINAL   Final  CULTURE, RESPIRATORY (NON-EXPECTORATED)     Status: None   Collection Time    02/23/13 11:31 AM      Result Value Range Status   Specimen Description TRACHEAL ASPIRATE   Final   Special Requests NONE   Final   Gram Stain     Final   Value: ABUNDANT WBC PRESENT,BOTH PMN AND MONONUCLEAR     RARE SQUAMOUS EPITHELIAL CELLS PRESENT     NO ORGANISMS SEEN     Performed at [facility]   Culture     Final   Value: FEW CANDIDA ALBICANS     Performed at [facility]   Report Status 02/25/2013 FINAL   Final  CLOSTRIDIUM DIFFICILE BY PCR     Status: Abnormal   Collection Time    02/24/13  9:02 AM      Result Value Range Status   C difficile by pcr POSITIVE (*) NEGATIVE Final   Comment: CRITICAL RESULT CALLED TO, READ BACK BY AND VERIFIED WITH:     STONE,A. AT 1230 ON 02/24/2013 BY BAUGHAM,M.   Studies/Results: Dg Chest 1 View  02/23/2013   CLINICAL DATA:  Respiratory failure  EXAM: CHEST - 1 VIEW  COMPARISON:  February 19, 2013 chest radiograph and chest CT  FINDINGS: There is underlying emphysema. There is patchy infiltrate in the right base. Elsewhere lungs are clear. Heart is mildly enlarged with normal pulmonary vascularity. Central catheter tip is in the superior vena cava. No pneumothorax. No adenopathy.  IMPRESSION: Right base infiltrate.  Underlying emphysema.  No pneumothorax.   Electronically Signed   By: Bretta Bang M.D.   On: 02/23/2013 10:02   Dg Chest Port 1 View  02/25/2013   CLINICAL DATA:  Respiratory failure  EXAM: PORTABLE CHEST - 1 VIEW  COMPARISON:  02/24/2013; 02/19/2013; chest CT -02/19/2013  FINDINGS: Grossly unchanged cardiac silhouette and mediastinal contours with mild tortuosity of the thoracic aorta. Stable position of support apparatus. No pneumothorax. Lung volumes remain slightly reduced with grossly unchanged perihilar and bilateral medial basilar opacities. No new focal airspace opacities. No definite pleural  effusion. No definite evidence of edema. Unchanged bones.  IMPRESSION: 1.  Stable positioning of support apparatus.  No pneumothorax. 2. Persistently reduced lung volumes with grossly unchanged perihilar and bilateral medial basilar atelectasis.   Electronically Signed   By: Simonne Come M.D.   On: 02/25/2013 08:01   Dg Chest Port 1 View  02/24/2013   CLINICAL DATA:  Respiratory failure.  EXAM: PORTABLE CHEST - 1 VIEW  COMPARISON:  02/23/2013  FINDINGS: Endotracheal tube is 3.5 cm above the carina. PICC line tip in the SVC region. Chronic lung changes consistent with emphysema. There continues to be densities at the right lung base but slightly  improved aeration compared to the prior examination. No evidence for a pneumothorax.  IMPRESSION: Slightly improved aeration at the right lung base.  Chronic lung changes.   Electronically Signed   By: Richarda Overlie M.D.   On: 02/24/2013 08:03    Medications:  Prior to Admission:  Prescriptions prior to admission  Medication Sig Dispense Refill  . acetaminophen-codeine (TYLENOL #3) 300-30 MG per tablet Take 1 tablet by mouth every 4 (four) hours as needed for pain.  60 tablet  5  . albuterol (PROVENTIL HFA;VENTOLIN HFA) 108 (90 BASE) MCG/ACT inhaler Inhale 2 puffs into the lungs as needed for wheezing (rescue inhaler).       Marland Kitchen albuterol (PROVENTIL) (2.5 MG/3ML) 0.083% nebulizer solution Take 2.5 mg by nebulization every 6 (six) hours as needed for wheezing or shortness of breath.      . cloNIDine (CATAPRES) 0.2 MG tablet Take 0.2 mg by mouth 3 (three) times daily.      Marland Kitchen docusate sodium (COLACE) 100 MG capsule Take 100 mg by mouth 2 (two) times daily as needed for mild constipation.      . furosemide (LASIX) 40 MG tablet Take 1 tablet (40 mg total) by mouth 2 (two) times daily.  30 tablet  5  . guaiFENesin (MUCINEX) 600 MG 12 hr tablet Take 1,200 mg by mouth at bedtime as needed for to loosen phlegm.       Marland Kitchen HYDROcodone-acetaminophen (NORCO/VICODIN) 5-325 MG per  tablet Take 1 tablet by mouth every 6 (six) hours as needed for pain.  120 tablet  0  . methotrexate (RHEUMATREX) 2.5 MG tablet Take 10 mg by mouth once a week. Saturday      . mometasone-formoterol (DULERA) 200-5 MCG/ACT AERO Inhale 2 puffs into the lungs 2 (two) times daily.  1 Inhaler  12  . pantoprazole (PROTONIX) 40 MG tablet Take 1 tablet (40 mg total) by mouth 2 (two) times daily.  60 tablet  12  . predniSONE (DELTASONE) 10 MG tablet Take 10 mg by mouth 2 (two) times daily.      . ranitidine (ZANTAC) 300 MG tablet Take 300 mg by mouth at bedtime.       Scheduled: . ampicillin-sulbactam (UNASYN) IV  1.5 g Intravenous Q6H  . antiseptic oral rinse  15 mL Mouth Rinse QID  . chlorhexidine  15 mL Mouth/Throat BID  . feeding supplement (PRO-STAT SUGAR FREE 64)  30 mL Per Tube TID  . furosemide  80 mg Intravenous Q12H  . insulin aspart  0-20 Units Subcutaneous Q4H  . ipratropium  0.5 mg Nebulization Q4H  . levalbuterol  0.63 mg Nebulization Q4H  . methylPREDNISolone (SOLU-MEDROL) injection  125 mg Intravenous Q6H  . metoCLOPramide (REGLAN) injection  5 mg Intravenous Q6H  . metroNIDAZOLE  500 mg Oral TID  . pantoprazole (PROTONIX) IV  40 mg Intravenous QHS  . sodium chloride  10-40 mL Intracatheter Q12H   Continuous: . dextrose 5 % and 0.45 % NaCl with KCl 10 mEq/L 100 mL/hr at 02/25/13 0751  . diltiazem (CARDIZEM) infusion 15 mg/hr (02/25/13 0600)  . feeding supplement (VITAL AF 1.2 CAL) Stopped (02/25/13 0730)  . fentaNYL infusion INTRAVENOUS 75 mcg/hr (02/25/13 0600)   ZOX:WRUEAV chloride, fentaNYL, labetalol, LORazepam, sodium chloride  Assesment: He has acute on chronic respiratory failure and is not improving. I think he aspirated tube feedings and is having trouble with the tube feedings again. He has acute on chronic diastolic heart failure. His chest x-ray looks okay now. His renal  failure is somewhat worse. He is not tolerating tube feedings as mentioned. He's having more  trouble with his heart rate. He's having more trouble with agitation. I have discussed the situation with him at some length 2 days ago when he was hopeful of being able to be extubated here and not have to transferred to a higher level of care but I don't think we have a choice at this point Principal Problem:   Respiratory failure, acute Active Problems:   COPD exacerbation   Acute on chronic renal failure   Acute-on-chronic respiratory failure   Bilateral lower extremity edema   Obstructive sleep apnea   Fever   Acute respiratory failure   Acute on chronic diastolic heart failure   Hypokalemia    Plan: Transfer to [facility]    LOS: 12 days   Corinthia Helmers L 02/25/2013, 8:27 AM

## 2013-02-25 NOTE — Progress Notes (Signed)
eLink Physician-Brief Progress Note Patient Name: [name] DOB: 1955/10/17 MRN: [id number]  Date of Service  02/25/2013   HPI/Events of Note   Fever  eICU Interventions  Tylenol       [name] 02/25/2013, 9:26 PM

## 2013-02-25 NOTE — Progress Notes (Signed)
Inpatient Diabetes Program Recommendations  AACE/ADA: New Consensus Statement on Inpatient Glycemic Control (2013)  Target Ranges:  Prepandial:   less than 140 mg/dL      Peak postprandial:   less than 180 mg/dL (1-2 hours)      Critically ill patients:  140 - 180 mg/dL   Results for ARIN, PERAL (MRN [id number]) as of 02/25/2013 08:32  Ref. Range 02/24/2013 07:40 02/24/2013 11:37 02/24/2013 16:10 02/24/2013 19:31 02/24/2013 23:03 02/25/2013 03:22 02/25/2013 07:33  Glucose-Capillary Latest Range: 70-99 mg/dL 621 (H) 308 (H) 657 (H) 214 (H) 201 (H) 198 (H) 174 (H)   Inpatient Diabetes Program Recommendations Insulin - Basal: May want to consider orderiing low dose basal insulin if high dose steroids are going to be continued; recommend starting with Lantus 10 units Q24H.  Note: Blood glucose ranged from 174-214 mg/dl over the last 24 hours and he has received a total of Novolog 37 units for correction.  Note that patient's condition is worse and he will be transferring to [facility] today for a higher level of care.  If steroids are going to be continued, may want to order low dose basal insulin.  Will continue to follow.  Thanks, Orlando Penner, RN, MSN, CCRN Diabetes Coordinator Inpatient Diabetes Program [phone] (Team Pager) [phone] ([facility] office) [phone] [facility] office)

## 2013-02-25 NOTE — Progress Notes (Signed)
Patient transferred to [facility] via [facility]. Family aware of transfer.

## 2013-02-25 NOTE — Progress Notes (Signed)
Report called to Melody on 2100 at [facility].

## 2013-02-25 NOTE — Progress Notes (Signed)
Pt was transferred to [facility] per Dr. Juanetta Gosling

## 2013-02-25 NOTE — Progress Notes (Signed)
Present with patient's girlfriend for support.

## 2013-02-25 NOTE — H&P (Signed)
PULMONARY  / CRITICAL CARE MEDICINE  Name: [name] MRN: [id number] DOB: 02/11/1956    ADMISSION DATE:  02/10/2013 CONSULTATION DATE:  02/25/2013  REFERRING MD :  Transfer from [facility] PRIMARY SERVICE:  PCCM  CHIEF COMPLAINT:  Acute respiratory failure  BRIEF PATIENT DESCRIPTION: 57 yo with past medical history of COPD, OSA who presented to [facility] ED on 12/06 and was intubated for acute respiratory failure.  Course was complicated by acute renal failure, aspiration and pseudomembranous colitis.  Transferred to [facility] 12/18 for further management.  SIGNIFICANT EVENTS / STUDIES:  12/06  Presented to [facility] with acute respiratory failure, intubated 12/10  Renal US >>> medical renal disease, no hydronephrosis 12/11  TTE >>> LVH, EF 65-70, grade 1 diastolic dysfunction 12/12  CTA chest >>> no PE, stenosis of the distal trachea and bilateral mainstem bronchi, emphysema 12/18  Transferred to [facility]  LINES / TUBES:  OETT 12/06 >>> OGT 12/06 >>> Foley 12/06 >>> L PICC ??? >>>  CULTURES: 12/07 MRSA PCR >>> neg 12/09 Urine >>> neg 12/16 Respiratory >>> Candida 12/17  C.dif PCR >>> POSITIVE  ANTIBIOTICS: Unasyn 12/16 >>> Flagyl 12/17 >>>  The patient is encephalopathic and unable to provide history, which was obtained for available medical records.  HISTORY OF PRESENT ILLNESS:  57 yo with past medical history of COPD, OSA who presented to [facility] ED on 12/06 and was intubated for acute respiratory failure.  Course was complicated by acute renal failure, aspiration and pseudomembranous colitis.  Transferred to [facility] 12/18 for further management.  PAST MEDICAL HISTORY :  Past Medical History  Diagnosis Date  . Hypertension   . Dyspnea   . Chronic kidney disease, stage 2, mildly decreased GFR     Creatinine of 1.31 in 04/2011  . COPD (chronic obstructive pulmonary disease)     moderate by spirometry in 08/2009;FEV1 of 1.1 ;nl alpha -1 antitrypsin   . Obesity   . Psoriasis   . Left eye trauma      with loss of vision  . Erectile dysfunction   . Lumbosacral spinal stenosis     chronic low back pain  . Headache(784.0)   . Sleep apnea     O2 at night  . Carotid artery aneurysm   . Asthma   . Psoriasis   . CHF (congestive heart failure)   . Renal insufficiency    Past Surgical History  Procedure Laterality Date  . Orif patella  12/14/2005    left fracture Dr.Harrison   . Ophthalmologic surgery  1963    secondary to left eye trauma, as a child hit in eye with tree limb   . Colonoscopy N/A 05/06/2012    RMR: internal hemorrhoids, tubular adenoma, cecal ulcerations with benign path, repeat in 2019  . Esophagogastroduodenoscopy  05/06/12    RMR: probably cervical esophageal web s/p 80 F dilation, hiatal hernia, antral and bulbar erosions with negative H.pylori   Prior to Admission medications   Medication Sig Start Date End Date Taking? Authorizing Provider  acetaminophen-codeine (TYLENOL #3) 300-30 MG per tablet Take 1 tablet by mouth every 4 (four) hours as needed for pain. 01/06/13  Yes Vickki Hearing, MD  albuterol (PROVENTIL HFA;VENTOLIN HFA) 108 (90 BASE) MCG/ACT inhaler Inhale 2 puffs into the lungs as needed for wheezing (rescue inhaler).    Yes Historical Provider, MD  albuterol (PROVENTIL) (2.5 MG/3ML) 0.083% nebulizer solution Take 2.5 mg by nebulization every 6 (six) hours as needed for wheezing or shortness of breath.  Yes Historical Provider, MD  cloNIDine (CATAPRES) 0.2 MG tablet Take 0.2 mg by mouth 3 (three) times daily.   Yes Historical Provider, MD  docusate sodium (COLACE) 100 MG capsule Take 100 mg by mouth 2 (two) times daily as needed for mild constipation.   Yes Historical Provider, MD  furosemide (LASIX) 40 MG tablet Take 1 tablet (40 mg total) by mouth 2 (two) times daily. 12/01/12  Yes Fredirick Maudlin, MD  guaiFENesin (MUCINEX) 600 MG 12 hr tablet Take 1,200 mg by mouth at bedtime as needed for to loosen phlegm.    Yes Historical Provider, MD   HYDROcodone-acetaminophen (NORCO/VICODIN) 5-325 MG per tablet Take 1 tablet by mouth every 6 (six) hours as needed for pain. 12/07/12  Yes Vickki Hearing, MD  methotrexate (RHEUMATREX) 2.5 MG tablet Take 10 mg by mouth once a week. Saturday 11/20/12  Yes Historical Provider, MD  mometasone-formoterol (DULERA) 200-5 MCG/ACT AERO Inhale 2 puffs into the lungs 2 (two) times daily. 01/05/13  Yes Fredirick Maudlin, MD  pantoprazole (PROTONIX) 40 MG tablet Take 1 tablet (40 mg total) by mouth 2 (two) times daily. 01/05/13  Yes Fredirick Maudlin, MD  predniSONE (DELTASONE) 10 MG tablet Take 10 mg by mouth 2 (two) times daily. 01/05/13  Yes Fredirick Maudlin, MD  ranitidine (ZANTAC) 300 MG tablet Take 300 mg by mouth at bedtime.   Yes Historical Provider, MD   Allergies  Allergen Reactions  . Bee Venom Anaphylaxis    Patient has an epi pen   FAMILY HISTORY:  Family History  Problem Relation Age of Onset  . Heart disease    . Arthritis    . Lung disease    . Cancer    . Asthma    . Colon cancer Brother     deceased at 34   . Brain cancer Brother    SOCIAL HISTORY:  reports that he quit smoking about 2 years ago. He has quit using smokeless tobacco. He reports that he drinks about 0.5 ounces of alcohol per week. He reports that he uses illicit drugs (Marijuana).  REVIEW OF SYSTEMS:  Unable to provide.  INTERVAL HISTORY:  VITAL SIGNS: Temp:  [97.1 F (36.2 C)-99.7 F (37.6 C)] 99.7 F (37.6 C) (12/18 1130) Pulse Rate:  [96-133] 121 (12/18 0945) Resp:  [15-25] 21 (12/18 0900) BP: (116-194)/(55-82) 160/56 mmHg (12/18 1130) SpO2:  [92 %-99 %] 99 % (12/18 0945) FiO2 (%):  [45 %] 45 % (12/18 0759) Weight:  [105.1 kg (231 lb 11.3 oz)] 105.1 kg (231 lb 11.3 oz) (12/18 0500) HEMODYNAMICS:   VENTILATOR SETTINGS: Vent Mode:  [-] PRVC FiO2 (%):  [45 %] 45 % Set Rate:  [18 bmp] 18 bmp Vt Set:  [620 mL-650 mL] 650 mL PEEP:  [5 cmH20] 5 cmH20 Plateau Pressure:  [20 cmH20-25 cmH20] 25  cmH20 INTAKE / OUTPUT: Intake/Output     12/17 0701 - 12/18 0700 12/18 0701 - 12/19 0700   I.V. (mL/kg) 2573.3 (24.5) 594.8 (5.7)   NG/GT 845.7 110   IV Piggyback 200 50   Total Intake(mL/kg) 3618.9 (34.4) 754.8 (7.2)   Urine (mL/kg/hr) 1200 (0.5)    Emesis/NG output  625 (1)   Total Output 1200 625   Net +2418.9 +129.8         PHYSICAL EXAMINATION: General:  Appears acutely ill, mechanically ventilated, synchronous Neuro:  Encephalopathic, nonfocal, cough / gag diminished HEENT:  PERRL, OETT / OGT Cardiovascular:  RRR, no m/r/g Lungs:  Bilateral  diminished air entry, no w/r/r Abdomen:  Soft, nontender, bowel sounds diminished Musculoskeletal:  Moves all extremities, no edema Skin:  Intact  LABS: CBC  Recent Labs Lab 02/21/13 0504 02/23/13 0426 02/25/13 0444  WBC 11.0* 15.9* 22.6*  HGB 10.0* 11.3* 14.7  HCT 33.2* 36.7* 47.0  PLT 186 213 219   Coag's No results found for this basename: APTT, INR,  in the last 168 hours BMET  Recent Labs Lab 02/23/13 0427 02/24/13 0443 02/25/13 0409  NA 144 143 140  K 3.9 3.8 4.5  CL 101 100 96  CO2 33* 32 27  BUN 47* 57* 78*  CREATININE 1.32 1.48* 2.33*  GLUCOSE 200* 196* 189*   Electrolytes  Recent Labs Lab 02/19/13 0416  02/23/13 0427 02/24/13 0443 02/25/13 0409  CALCIUM 8.7  < > 8.7 8.7 8.6  PHOS 3.9  --   --   --   --   < > = values in this interval not displayed. Sepsis Markers No results found for this basename: LATICACIDVEN, PROCALCITON, O2SATVEN,  in the last 168 hours ABG  Recent Labs Lab 02/24/13 0445 02/24/13 0915 02/25/13 0625  PHART 7.446 7.372 7.308*  PCO2ART 45.3* 54.7* 54.0*  PO2ART 99.3 75.8* 79.4*   Liver Enzymes No results found for this basename: AST, ALT, ALKPHOS, BILITOT, ALBUMIN,  in the last 168 hours Cardiac Enzymes No results found for this basename: TROPONINI, PROBNP,  in the last 168 hours Glucose  Recent Labs Lab 02/24/13 1610 02/24/13 1931 02/24/13 2303 02/25/13 0322  02/25/13 0733 02/25/13 1108  GLUCAP 214* 214* 201* 198* 174* 159*    CXR:  12/18 >>> Hardware in good position, bilateral airspace disease.  ASSESSMENT / PLAN:  PULMONARY A:  Acute on chronic respiratory failure.  Failure to wean.  COPD exacerbation. Suspected aspiration pneumonia/ pneumonitis. 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 H/o OSA P:   Goal SpO2>92, pH>7.30 Full mechanical support Daily SBT Ventilator bundle Trend ABG / CXR Xopenex / Atrovent Decrease Solu-Medrol to 80 q12h Bronchoscopy for airway assessment?  CARDIOVASCULAR A: Acute on chronic diastolic heart failure. HTN. P:  Goal MAP>60 Labetalol PRN  RENAL A:  Acute on chronic renal failure. Clinically hypovolemic. P:   Trend BMP D/c Lasix NS@100   GASTROINTESTINAL A:  Pseudomembranous colitis. Ileus. P:   NPO as intubated TF per Nutritionist, hold for high residuals Protonix for GI Px Reglan  HEMATOLOGIC A:  No active issues. P:  Trend CBC Heparin for DVT Px  INFECTIOUS A:  Aspiration pneumonia. Pseudomembranous colitis. P:   Cultures and antibiotics as above  ENDOCRINE  A:  DM2. Hyperglycemia. P:   SSI  NEUROLOGIC A:  Acute encephalopathy. P:   Goal RASS 0 to -1 Daily WUA Propofol gtt  I have personally obtained history, examined patient, evaluated and interpreted laboratory and imaging results, reviewed medical records, formulated assessment / plan and placed orders.  CRITICAL CARE:  The patient is critically ill with multiple organ systems failure and requires high complexity decision making for assessment and support, frequent evaluation and titration of therapies, application of advanced monitoring technologies and extensive interpretation of multiple databases. Critical Care Time devoted to patient care services described in this note is 45 minutes.   Lonia Farber, MD Pulmonary and Critical Care Medicine [facility] Pager: [phone]  02/25/2013, 1:16 PM

## 2013-02-25 NOTE — Progress Notes (Signed)
NUTRITION FOLLOW UP  Intervention:    Initiate TF via OGT with Vital HP at 25 ml/h, increase by 10 ml every 4 hours to goal rate of 45 ml/h with Prostat 30 ml QID to provide 1480 kcals, 155 gm protein, 903 ml free water daily.  TF plus current Propofol will provide a total of 2056 kcals (25 kcals/kg ideal weight) and 155 gm protein (96% of estimated needs).  Nutrition Dx:   Inadequate oral intake related to inability to eat as evidenced by NPO status; ongoing.   Goal:   Enteral nutrition to provide 60-70% of estimated calorie needs (22-25 kcals/kg ideal body weight) and 100% of estimated protein needs, based on ASPEN guidelines for permissive underfeeding in critically ill obese individuals. Unmet.  Monitor:   TF tolerance/adequacy, weight trend, labs, vent status.  Assessment:   Patient is a 57 yo with past medical history of COPD and OSA who presented to [facility] on 12/06 and was intubated for acute respiratory failure. Course was complicated by acute renal failure, aspiration and pseudomembranous colitis. Transferred to [facility] 12/18 for further management.  Patient has been receiving Vital AF 1.2 TF at 40 ml/h with Prostat 30 ml TID to provide 1452 kcals, 117 gm protein, 779 ml free water daily. Tolerating well with minimal residuals. TF currently off.  Patient remains intubated on ventilator support.  MV: 12 L/min Temp (24hrs), Avg:98.3 F (36.8 C), Min:97.1 F (36.2 C), Max:99.7 F (37.6 C)  Propofol: 21.8 ml/hr providing 576 kcals/day   Height: Ht Readings from Last 1 Encounters:  02/23/13 6' (1.829 m)    Weight Status:   Wt Readings from Last 1 Encounters:  02/25/13 231 lb 11.3 oz (105.1 kg)  02/24/13  229 lb 15 oz (104.3 kg)   Body mass index is 31.42 kg/(m^2).  Re-estimated needs:  Kcal: 2275 Protein: 161 gm Fluid: 2.3-2.5 L  Skin: no wounds  Diet Order: NPO   Intake/Output Summary (Last 24 hours) at 02/25/13 1505 Last data filed at 02/25/13 1400  Gross per  24 hour  Intake 4140.01 ml  Output   1825 ml  Net 2315.01 ml    Last BM: 12/17   Labs:   Recent Labs Lab 02/19/13 0416  02/23/13 0427 02/24/13 0443 02/25/13 0409  NA 149*  < > 144 143 140  K 3.6  < > 3.9 3.8 4.5  CL 108  < > 101 100 96  CO2 30  < > 33* 32 27  BUN 69*  < > 47* 57* 78*  CREATININE 1.57*  < > 1.32 1.48* 2.33*  CALCIUM 8.7  < > 8.7 8.7 8.6  PHOS 3.9  --   --   --   --   GLUCOSE 168*  < > 200* 196* 189*  < > = values in this interval not displayed.  CBG (last 3)   Recent Labs  02/25/13 0322 02/25/13 0733 02/25/13 1108  GLUCAP 198* 174* 159*    Scheduled Meds: . ampicillin-sulbactam (UNASYN) IV  1.5 g Intravenous Q6H  . antiseptic oral rinse  15 mL Mouth Rinse QID  . chlorhexidine  15 mL Mouth/Throat BID  . feeding supplement (PRO-STAT SUGAR FREE 64)  30 mL Per Tube TID  . insulin aspart  0-20 Units Subcutaneous Q4H  . ipratropium  0.5 mg Nebulization Q4H  . levalbuterol  0.63 mg Nebulization Q4H  . methylPREDNISolone (SOLU-MEDROL) injection  80 mg Intravenous Q12H  . metoCLOPramide (REGLAN) injection  5 mg Intravenous Q6H  .  metroNIDAZOLE  500 mg Oral TID  . pantoprazole (PROTONIX) IV  40 mg Intravenous QHS  . sodium chloride  10-40 mL Intracatheter Q12H    Continuous Infusions: . sodium chloride 100 mL/hr at 02/25/13 1348  . diltiazem (CARDIZEM) infusion 15 mg/hr (02/25/13 0600)  . feeding supplement (VITAL AF 1.2 CAL) Stopped (02/25/13 0730)  . propofol 35 mcg/kg/min (02/25/13 1426)    Joaquin Courts, RD, LDN, CNSC Pager [phone] After Hours Pager [phone]

## 2013-02-25 NOTE — Progress Notes (Signed)
Subjective: Interval History: none.  Objective: Vital signs in last 24 hours: Temp:  [97.1 F (36.2 C)-98.5 F (36.9 C)] 97.3 F (36.3 C) (12/18 0400) Pulse Rate:  [96-125] 119 (12/18 0600) Resp:  [15-25] 18 (12/17 1900) BP: (116-178)/(54-75) 148/70 mmHg (12/18 0600) SpO2:  [92 %-99 %] 94 % (12/18 0716) FiO2 (%):  [45 %-100 %] 45 % (12/18 0437) Weight:  [105.1 kg (231 lb 11.3 oz)] 105.1 kg (231 lb 11.3 oz) (12/18 0500) Weight change: 0.8 kg (1 lb 12.2 oz)  Intake/Output from previous day: 12/17 0701 - 12/18 0700 In: 3496.4 [I.V.:2450.8; NG/GT:845.7; IV Piggyback:200] Out: 1200 [Urine:1200] Intake/Output this shift:    Patient remains intubated. Sommolent but arousable Chest decreased breath sound anteriorly. He has some expiratory rhonchi. Heart exam revealed regular rate and rhythm no murmur Abdomen:Is  full, tympanic but nontender and positive bowel sound Extremities: He has 1+ edema bilaterally.  Lab Results:  Recent Labs  02/23/13 0426  WBC 15.9*  HGB 11.3*  HCT 36.7*  PLT 213   BMET:   Recent Labs  02/24/13 0443 02/25/13 0409  NA 143 140  K 3.8 4.5  CL 100 96  CO2 32 27  GLUCOSE 196* 189*  BUN 57* 78*  CREATININE 1.48* 2.33*  CALCIUM 8.7 8.6   No results found for this basename: PTH,  in the last 72 hours Iron Studies: No results found for this basename: IRON, TIBC, TRANSFERRIN, FERRITIN,  in the last 72 hours  Studies/Results: Dg Chest 1 View  02/23/2013   CLINICAL DATA:  Respiratory failure  EXAM: CHEST - 1 VIEW  COMPARISON:  February 19, 2013 chest radiograph and chest CT  FINDINGS: There is underlying emphysema. There is patchy infiltrate in the right base. Elsewhere lungs are clear. Heart is mildly enlarged with normal pulmonary vascularity. Central catheter tip is in the superior vena cava. No pneumothorax. No adenopathy.  IMPRESSION: Right base infiltrate.  Underlying emphysema.  No pneumothorax.   Electronically Signed   By: Bretta Bang  M.D.   On: 02/23/2013 10:02   Dg Chest Port 1 View  02/24/2013   CLINICAL DATA:  Respiratory failure.  EXAM: PORTABLE CHEST - 1 VIEW  COMPARISON:  02/23/2013  FINDINGS: Endotracheal tube is 3.5 cm above the carina. PICC line tip in the SVC region. Chronic lung changes consistent with emphysema. There continues to be densities at the right lung base but slightly improved aeration compared to the prior examination. No evidence for a pneumothorax.  IMPRESSION: Slightly improved aeration at the right lung base.  Chronic lung changes.   Electronically Signed   By: Richarda Overlie M.D.   On: 02/24/2013 08:03    I have reviewed the patient's current medications.  Assessment/Plan: Problem #1 acute kidney injury : His BUN and creatinine started increasing  Problem #2 chronic renal failure . Patient with stage III chronic renal failure.  Problem #3 hypernatremia. Hyponatremia has corected Problem #4 hypokalemia: His potassium is with in normal range.  Problem #5 respiratory failure. Presently patient is intubated. Problem #6 history of psoriasis Problem #7 hypertension: His blood pressures seem to be controlled. Problem #8 history of COPD/sleep apnea Problem# 9 CHF on lasix with good urine out put, but has declined. Still he has some edema. Plan:   Continue with hydration           We'll continue with Lasix with present dose.            We'll check his basic metabolic panel in  the morning.  LOS: 12 days   Tashari Schoenfelder S 02/25/2013,7:25 AM

## 2013-02-25 NOTE — Progress Notes (Signed)
Pt's HR is elevated and he is very agitated. Dr. Thurnell Lose is transferring him to [facility]

## 2013-02-25 NOTE — Progress Notes (Signed)
ET Tube holder was changed on 02/25/13

## 2013-02-25 NOTE — Progress Notes (Signed)
Report given to carelink. They are in route.

## 2013-02-26 ENCOUNTER — Inpatient Hospital Stay (HOSPITAL_COMMUNITY): Payer: Medicare HMO

## 2013-02-26 DIAGNOSIS — J9600 Acute respiratory failure, unspecified whether with hypoxia or hypercapnia: Secondary | ICD-10-CM

## 2013-02-26 DIAGNOSIS — N289 Disorder of kidney and ureter, unspecified: Secondary | ICD-10-CM

## 2013-02-26 DIAGNOSIS — N179 Acute kidney failure, unspecified: Secondary | ICD-10-CM

## 2013-02-26 DIAGNOSIS — N189 Chronic kidney disease, unspecified: Secondary | ICD-10-CM

## 2013-02-26 DIAGNOSIS — I5033 Acute on chronic diastolic (congestive) heart failure: Secondary | ICD-10-CM

## 2013-02-26 DIAGNOSIS — J209 Acute bronchitis, unspecified: Secondary | ICD-10-CM

## 2013-02-26 LAB — URINALYSIS, ROUTINE W REFLEX MICROSCOPIC
Bilirubin Urine: NEGATIVE
Glucose, UA: NEGATIVE mg/dL
Ketones, ur: NEGATIVE mg/dL
Nitrite: NEGATIVE
Specific Gravity, Urine: 1.02 (ref 1.005–1.030)
Urobilinogen, UA: 0.2 mg/dL (ref 0.0–1.0)
pH: 5 (ref 5.0–8.0)

## 2013-02-26 LAB — CBC
HCT: 43 % (ref 39.0–52.0)
Hemoglobin: 14 g/dL (ref 13.0–17.0)
MCHC: 32.6 g/dL (ref 30.0–36.0)
MCV: 95.3 fL (ref 78.0–100.0)
RDW: 15.9 % — ABNORMAL HIGH (ref 11.5–15.5)
WBC: 20.4 10*3/uL — ABNORMAL HIGH (ref 4.0–10.5)

## 2013-02-26 LAB — BLOOD GAS, ARTERIAL
Bicarbonate: 22.5 mEq/L (ref 20.0–24.0)
Drawn by: 34779
O2 Saturation: 94.4 %
PEEP: 5 cmH2O
Patient temperature: 99
RATE: 18 resp/min
pH, Arterial: 7.344 — ABNORMAL LOW (ref 7.350–7.450)
pO2, Arterial: 75.8 mmHg — ABNORMAL LOW (ref 80.0–100.0)

## 2013-02-26 LAB — BASIC METABOLIC PANEL
BUN: 107 mg/dL — ABNORMAL HIGH (ref 6–23)
BUN: 111 mg/dL — ABNORMAL HIGH (ref 6–23)
BUN: 99 mg/dL — ABNORMAL HIGH (ref 6–23)
BUN: 120 mg/dL — ABNORMAL HIGH (ref 6–23)
CO2: 25 mEq/L (ref 19–32)
CO2: 18 mEq/L — ABNORMAL LOW (ref 19–32)
Calcium: 7.7 mg/dL — ABNORMAL LOW (ref 8.4–10.5)
Calcium: 7.5 mg/dL — ABNORMAL LOW (ref 8.4–10.5)
Chloride: 99 mEq/L (ref 96–112)
Chloride: 99 mEq/L (ref 96–112)
Chloride: 102 mEq/L (ref 96–112)
Chloride: 100 mEq/L (ref 96–112)
Creatinine, Ser: 3.57 mg/dL — ABNORMAL HIGH (ref 0.50–1.35)
Creatinine, Ser: 3.51 mg/dL — ABNORMAL HIGH (ref 0.50–1.35)
GFR calc Af Amer: 19 mL/min — ABNORMAL LOW (ref >90)
GFR calc Af Amer: 25 mL/min — ABNORMAL LOW (ref >90)
GFR calc Af Amer: 21 mL/min — ABNORMAL LOW (ref >90)
GFR calc non Af Amer: 16 mL/min — ABNORMAL LOW (ref >90)
GFR calc non Af Amer: 22 mL/min — ABNORMAL LOW (ref >90)
Glucose, Bld: 178 mg/dL — ABNORMAL HIGH (ref 70–99)
Glucose, Bld: 234 mg/dL — ABNORMAL HIGH (ref 70–99)
Glucose, Bld: 189 mg/dL — ABNORMAL HIGH (ref 70–99)
Potassium: 5.8 mEq/L — ABNORMAL HIGH (ref 3.5–5.1)
Potassium: 5.9 mEq/L — ABNORMAL HIGH (ref 3.5–5.1)
Potassium: 4.5 mEq/L (ref 3.5–5.1)
Sodium: 139 mEq/L (ref 135–145)
Sodium: 137 mEq/L (ref 135–145)
Sodium: 136 mEq/L (ref 135–145)

## 2013-02-26 LAB — URINE MICROSCOPIC-ADD ON

## 2013-02-26 LAB — GLUCOSE, CAPILLARY
Glucose-Capillary: 170 mg/dL — ABNORMAL HIGH (ref 70–99)
Glucose-Capillary: 249 mg/dL — ABNORMAL HIGH (ref 70–99)
Glucose-Capillary: 169 mg/dL — ABNORMAL HIGH (ref 70–99)

## 2013-02-26 LAB — URIC ACID: Uric Acid, Serum: 12.6 mg/dL — ABNORMAL HIGH (ref 4.0–7.8)

## 2013-02-26 MED ORDER — METRONIDAZOLE IN NACL 5-0.79 MG/ML-% IV SOLN
500.0000 mg | Freq: Four times a day (QID) | INTRAVENOUS | Status: DC
  Administered 2013-02-26 – 2013-02-28 (×8): 500 mg via INTRAVENOUS
  Filled 2013-02-26 (×10): qty 100

## 2013-02-26 MED ORDER — SODIUM CHLORIDE 0.9 % IV SOLN
1.0000 g | Freq: Once | INTRAVENOUS | Status: AC
  Administered 2013-02-26: 1 g via INTRAVENOUS
  Filled 2013-02-26: qty 10

## 2013-02-26 MED ORDER — FAMOTIDINE IN NACL 20-0.9 MG/50ML-% IV SOLN
20.0000 mg | INTRAVENOUS | Status: DC
  Administered 2013-02-26 – 2013-02-27 (×2): 20 mg via INTRAVENOUS
  Filled 2013-02-26 (×4): qty 50

## 2013-02-26 MED ORDER — SODIUM POLYSTYRENE SULFONATE 15 GM/60ML PO SUSP
30.0000 g | Freq: Once | ORAL | Status: AC
  Administered 2013-02-26: 30 g
  Filled 2013-02-26: qty 120

## 2013-02-26 MED ORDER — IOHEXOL 300 MG/ML  SOLN: 25.0000 mL | INTRAMUSCULAR | Status: DC

## 2013-02-26 MED ORDER — SODIUM CHLORIDE 0.9 % IV BOLUS (SEPSIS)
500.0000 mL | Freq: Once | INTRAVENOUS | Status: AC
Start: 2013-02-26 — End: 2013-02-26
  Administered 2013-02-26: 500 mL via INTRAVENOUS

## 2013-02-26 MED ORDER — VANCOMYCIN HCL 500 MG IV SOLR
500.0000 mg | Freq: Four times a day (QID) | Status: DC
  Administered 2013-02-26 – 2013-02-28 (×8): 500 mg via RECTAL
  Filled 2013-02-26 (×13): qty 500

## 2013-02-26 MED ORDER — ALTEPLASE 100 MG IV SOLR
2.0000 mg | Freq: Once | INTRAVENOUS | Status: DC
  Filled 2013-02-26: qty 2

## 2013-02-26 MED ORDER — METHYLPREDNISOLONE SODIUM SUCC 40 MG IJ SOLR
40.0000 mg | Freq: Two times a day (BID) | INTRAMUSCULAR | Status: DC
  Administered 2013-02-26 (×2): 40 mg via INTRAVENOUS
  Filled 2013-02-26 (×4): qty 1

## 2013-02-26 MED ORDER — IOHEXOL 300 MG/ML  SOLN
25.0000 mL | INTRAMUSCULAR | Status: AC
  Administered 2013-02-26 (×2): 25 mL via ORAL

## 2013-02-26 MED ORDER — VANCOMYCIN 50 MG/ML ORAL SOLUTION
500.0000 mg | Freq: Four times a day (QID) | ORAL | Status: DC
  Administered 2013-02-26 – 2013-02-28 (×8): 500 mg via ORAL
  Filled 2013-02-26 (×13): qty 10

## 2013-02-26 NOTE — Progress Notes (Signed)
Patient is currently active with [facility] Care Management for chronic disease management services.  Patient has been engaged by a [employer].  Our community based plan of care has focused on disease management of CHF and COPD.  Will continue to monitor patient's progress.  Made Inpatient Case Manager aware that [facility] Care Management following. Of note, [facility] Care Management services does not replace or interfere with any services that are arranged by inpatient case management or social work.  For additional questions or referrals please contact Anibal Henderson BSN RN [facility] Liaison at [phone].

## 2013-02-26 NOTE — Progress Notes (Signed)
CRITICAL VALUE ALERT  Critical value received:  Ca 6.1  Date of notification:  02/26/2013   Time of notification:  15:23  Critical value read back:yes  Nurse who received alert:  Ulis Rias RN  MD notified (1st page):  Dr. Tyson Alias  Time of first page: 15:25  MD notified (2nd page):  Time of second page:  Responding MD:  Dr. Tyson Alias  Time MD responded:  15:25

## 2013-02-26 NOTE — Progress Notes (Signed)
eLink Physician-Brief Progress Note Patient Name: [name] DOB: [birth year] MRN: [id number]  Date of Service  02/26/2013   HPI/Events of Note   Low calcium, need albumin  eICU Interventions  Reaplce, correct for alb, ion cal   Intervention Category Intermediate Interventions: Electrolyte abnormality - evaluation and management  Nelda Bucks. 02/26/2013, 4:02 PM

## 2013-02-26 NOTE — Progress Notes (Signed)
Wasted Fentanyl in ICU sink with J.Childress,RN.

## 2013-02-26 NOTE — Progress Notes (Signed)
White port was flushed with a total of 20cc of NS with GBR. Advised the nurse tPA is not needed at this time. Consuello Masse

## 2013-02-26 NOTE — Progress Notes (Addendum)
Dr. Craige Cotta made aware of BMET, Mag and Phos levels. Patient continues to have runs of V-tach. No additional orders in place will continue to monitor patient.  Corliss Skains RN

## 2013-02-26 NOTE — Progress Notes (Signed)
eLink Physician-Brief Progress Note Patient Name: [name] DOB: [birth year] MRN: [id number]  Date of Service  02/26/2013   HPI/Events of Note  vt self resolved   eICU Interventions  Stat lytews   Intervention Category Major Interventions: Arrhythmia - evaluation and management  Nelda Bucks. 02/26/2013, 8:33 PM

## 2013-02-26 NOTE — H&P (Addendum)
PULMONARY  / CRITICAL CARE MEDICINE  Name: [name] MRN: [id number] DOB: Apr 08, 1955    ADMISSION DATE:  February 21, 2013 CONSULTATION DATE:  02/25/2013  REFERRING MD :  Transfer from [facility] PRIMARY SERVICE:  PCCM  CHIEF COMPLAINT:  Acute respiratory failure  BRIEF PATIENT DESCRIPTION: 57 yo with past medical history of COPD, OSA who presented to [facility] ED on 12/06 and was intubated for acute respiratory failure.  Course was complicated by acute renal failure, aspiration and pseudomembranous colitis.  Transferred to [facility] 12/18 for further management.  SIGNIFICANT EVENTS / STUDIES:  12/06  Presented to [facility] with acute respiratory failure, intubated 12/10  Renal US >>> medical renal disease, no hydronephrosis 12/11  TTE >>> LVH, EF 65-70, grade 1 diastolic dysfunction 12/12  CTA chest >>> no PE, stenosis of the distal trachea and bilateral mainstem bronchi, emphysema 12/18  Transferred to [facility] 12/19 - volume given, urine noted  LINES / TUBES:  OETT 12/06 >>> OGT 12/06 >>> Foley 12/06 >>> L PICC ??? >>>  CULTURES: 12/07 MRSA PCR >>> neg 12/09 Urine >>> neg 12/16 Respiratory >>> Candida 12/17  C.dif PCR >>> POSITIVE  ANTIBIOTICS: Unasyn 12/16 >>>12/19 Flagyl 12/17 >>> vanc oral 12/19>>> Enema vanc 12/19>>>  INTERVAL HISTORY: abdo distention  VITAL SIGNS: Temp:  [98.7 F (37.1 C)-102.3 F (39.1 C)] 99 F (37.2 C) (12/19 0410) Pulse Rate:  [100-133] 104 (12/19 0600) Resp:  [17-28] 24 (12/19 0600) BP: (109-194)/(45-82) 139/62 mmHg (12/19 0600) SpO2:  [92 %-99 %] 95 % (12/19 0600) FiO2 (%):  [45 %-50 %] 50 % (12/19 0400) Weight:  [105.1 kg (231 lb 11.3 oz)] 105.1 kg (231 lb 11.3 oz) (12/19 0330) HEMODYNAMICS:   VENTILATOR SETTINGS: Vent Mode:  [-] PRVC FiO2 (%):  [45 %-50 %] 50 % Set Rate:  [18 bmp] 18 bmp Vt Set:  [650 mL] 650 mL PEEP:  [5 cmH20] 5 cmH20 Plateau Pressure:  [22 cmH20-29 cmH20] 27 cmH20 INTAKE / OUTPUT: Intake/Output     12/18 0701 - 12/19 0700 12/19 0701  - 12/20 0700   I.V. (mL/kg) 2923.4 (27.8)    NG/GT 410    IV Piggyback 700    Total Intake(mL/kg) 4033.4 (38.4)    Urine (mL/kg/hr) 645 (0.3)    Emesis/NG output 625 (0.2)    Total Output 1270     Net +2763.4           PHYSICAL EXAMINATION: General: mechanically ventilated, synchronous Neuro:  rass -2, does not follow commands HEENT:  PERRL, OETT Cardiovascular:  RRR, no m/r/g Lungs:  Bilateral diminished air entry, no w/r/r Abdomen:  Soft, nontender, bowel sounds diminished, distention noted, no r/g Musculoskeletal:  Moves all extremities, no edema Skin:  Intact  LABS: CBC  Recent Labs Lab 02/23/13 0426 02/25/13 0444 02/26/13 0405  WBC 15.9* 22.6* 20.4*  HGB 11.3* 14.7 14.0  HCT 36.7* 47.0 43.0  PLT 213 219 166   Coag's No results found for this basename: APTT, INR,  in the last 168 hours BMET  Recent Labs Lab 02/24/13 0443 02/25/13 0409 02/26/13 0405  NA 143 140 139  K 3.8 4.5 5.8*  CL 100 96 99  CO2 32 27 25  BUN 57* 78* 107*  CREATININE 1.48* 2.33* 3.57*  GLUCOSE 196* 189* 178*   Electrolytes  Recent Labs Lab 02/24/13 0443 02/25/13 0409 02/26/13 0405  CALCIUM 8.7 8.6 7.8*   Sepsis Markers No results found for this basename: LATICACIDVEN, PROCALCITON, O2SATVEN,  in the last 168 hours ABG  Recent  Labs Lab 02/24/13 0915 02/25/13 0625 02/26/13 0500  PHART 7.372 7.308* 7.347*  PCO2ART 54.7* 54.0* 42.1  PO2ART 75.8* 79.4* 74.7*   Liver Enzymes No results found for this basename: AST, ALT, ALKPHOS, BILITOT, ALBUMIN,  in the last 168 hours Cardiac Enzymes No results found for this basename: TROPONINI, PROBNP,  in the last 168 hours Glucose  Recent Labs Lab 02/25/13 0733 02/25/13 1108 02/25/13 1620 02/25/13 2001 02/26/13 0015 02/26/13 0403  GLUCAP 174* 159* 150* 166* 150* 170*    CXR:  12/18 >>> Hardware in good position, bilateral airspace disease.  ASSESSMENT / PLAN:  PULMONARY A:  Acute on chronic respiratory failure.   Failure to wean.  COPD exacerbation. Suspected aspiration pneumonia/ pneumonitis. 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 H/o OSA P:   Keep same mV, reviewed last BAG SBT attempt ,m unlikely to wean to extubate until abdo distention and neuro improves Xopenex / Atrovent Decrease Solu-Medrol to reduce Bronchoscopy for airway assessment, pre extubation in future and if not weaning  CARDIOVASCULAR A: Acute on chronic diastolic heart failure. HTN., ST no fib Sepsis present at time of admission P:  Goal MAP>60 Labetalol PRN Renal failure, cvp assessment Dc cardizem in sr  RENAL A:  Acute on chronic renal failure. Clinically hypovolemic, mild hyperkalemia P:   Trend BMP NS@100 , continue this approach especially in setting cdiff kyxylate x 1 bmet in q8h consider bolus Renal US neg 12/10, may need repeat UA, urine osm, na Uric acid  GASTROINTESTINAL A:  Pseudomembranous colitis. Ileus. P:   kub TF per Nutritionist, hold for high residuals, would use vital when we do start Protonix for GI Px Reglan dc Ct abdo / pelvis  HEMATOLOGIC A:  hemoconcetrated likely, dvt prevention P:  Trend CBC Heparin for DVT Px volume  INFECTIOUS A:  Aspiration pneumonia?(unimpressed) Pseudomembranous colitis, r/o mega colon P:   Severe cdiff, dc unasyn Add oral vanc Add vanc enema Change flagyl to iv CT abdo/pelvis lactic  ENDOCRINE  A:  DM2. Hyperglycemia. P:   SSI  NEUROLOGIC A:  Acute encephalopathy. P:   Goal RASS 0 to -1 Daily WUA Propofol gtt  I have personally obtained history, examined patient, evaluated and interpreted laboratory and imaging results, reviewed medical records, formulated assessment / plan and placed orders.  CRITICAL CARE:  The patient is critically ill with multiple organ systems failure and requires high complexity decision making for assessment and support, frequent evaluation and titration of therapies, application of advanced monitoring  technologies and extensive interpretation of multiple databases. Critical Care Time devoted to patient care services described in this note is 45 minutes.   Nelda Bucks., MD Pulmonary and Critical Care Medicine [facility] Pager: [phone]  02/26/2013, 7:02 AM

## 2013-02-27 ENCOUNTER — Inpatient Hospital Stay (HOSPITAL_COMMUNITY): Payer: Medicare HMO

## 2013-02-27 DIAGNOSIS — I517 Cardiomegaly: Secondary | ICD-10-CM

## 2013-02-27 LAB — POCT I-STAT 3, ART BLOOD GAS (G3+)
Acid-base deficit: 5 mmol/L — ABNORMAL HIGH (ref 0.0–2.0)
Bicarbonate: 19.8 mEq/L — ABNORMAL LOW (ref 20.0–24.0)
O2 Saturation: 94 %
pCO2 arterial: 36.9 mmHg (ref 35.0–45.0)
pH, Arterial: 7.319 — ABNORMAL LOW (ref 7.350–7.450)
pH, Arterial: 7.337 — ABNORMAL LOW (ref 7.350–7.450)
pO2, Arterial: 76 mmHg — ABNORMAL LOW (ref 80.0–100.0)

## 2013-02-27 LAB — CBC WITH DIFFERENTIAL/PLATELET
Basophils Relative: 0 % (ref 0–1)
Eosinophils Absolute: 0 10*3/uL (ref 0.0–0.7)
Eosinophils Relative: 0 % (ref 0–5)
HCT: 40.4 % (ref 39.0–52.0)
Hemoglobin: 13.8 g/dL (ref 13.0–17.0)
Lymphocytes Relative: 3 % — ABNORMAL LOW (ref 12–46)
MCHC: 34.2 g/dL (ref 30.0–36.0)
Monocytes Relative: 6 % (ref 3–12)
Neutro Abs: 14.1 10*3/uL — ABNORMAL HIGH (ref 1.7–7.7)
Neutrophils Relative %: 91 % — ABNORMAL HIGH (ref 43–77)
RBC: 4.31 MIL/uL (ref 4.22–5.81)
RDW: 15.9 % — ABNORMAL HIGH (ref 11.5–15.5)
WBC: 15.5 10*3/uL — ABNORMAL HIGH (ref 4.0–10.5)

## 2013-02-27 LAB — BASIC METABOLIC PANEL
CO2: 17 mEq/L — ABNORMAL LOW (ref 19–32)
CO2: 18 mEq/L — ABNORMAL LOW (ref 19–32)
Chloride: 107 mEq/L (ref 96–112)
Creatinine, Ser: 2.81 mg/dL — ABNORMAL HIGH (ref 0.50–1.35)
GFR calc Af Amer: 23 mL/min — ABNORMAL LOW (ref >90)
Glucose, Bld: 176 mg/dL — ABNORMAL HIGH (ref 70–99)
Potassium: 5.6 mEq/L — ABNORMAL HIGH (ref 3.5–5.1)
Sodium: 140 mEq/L (ref 135–145)
Sodium: 136 mEq/L (ref 135–145)

## 2013-02-27 LAB — COMPREHENSIVE METABOLIC PANEL
Albumin: 1.9 g/dL — ABNORMAL LOW (ref 3.5–5.2)
Alkaline Phosphatase: 193 U/L — ABNORMAL HIGH (ref 39–117)
BUN: 124 mg/dL — ABNORMAL HIGH (ref 6–23)
CO2: 19 mEq/L (ref 19–32)
Calcium: 7.4 mg/dL — ABNORMAL LOW (ref 8.4–10.5)
Creatinine, Ser: 3.41 mg/dL — ABNORMAL HIGH (ref 0.50–1.35)
GFR calc Af Amer: 21 mL/min — ABNORMAL LOW (ref >90)
GFR calc non Af Amer: 18 mL/min — ABNORMAL LOW (ref >90)
Glucose, Bld: 161 mg/dL — ABNORMAL HIGH (ref 70–99)
Total Protein: 5 g/dL — ABNORMAL LOW (ref 6.0–8.3)

## 2013-02-27 LAB — GLUCOSE, CAPILLARY
Glucose-Capillary: 155 mg/dL — ABNORMAL HIGH (ref 70–99)
Glucose-Capillary: 138 mg/dL — ABNORMAL HIGH (ref 70–99)
Glucose-Capillary: 178 mg/dL — ABNORMAL HIGH (ref 70–99)

## 2013-02-27 LAB — TRIGLYCERIDES: Triglycerides: 1316 mg/dL — ABNORMAL HIGH (ref <150)

## 2013-02-27 MED ORDER — FENTANYL CITRATE 0.05 MG/ML IJ SOLN
0.0000 ug/h | INTRAMUSCULAR | Status: DC
  Administered 2013-02-27: 50 ug/h via INTRAVENOUS
  Filled 2013-02-27: qty 50

## 2013-02-27 MED ORDER — VITAL HIGH PROTEIN PO LIQD
1000.0000 mL | ORAL | Status: DC
  Administered 2013-02-27: 1000 mL
  Filled 2013-02-27 (×2): qty 1000

## 2013-02-27 MED ORDER — IPRATROPIUM BROMIDE 0.02 % IN SOLN
0.5000 mg | Freq: Four times a day (QID) | RESPIRATORY_TRACT | Status: DC
  Administered 2013-02-27 – 2013-02-28 (×3): 0.5 mg via RESPIRATORY_TRACT
  Filled 2013-02-27 (×3): qty 2.5

## 2013-02-27 MED ORDER — METOPROLOL TARTRATE 25 MG/10 ML ORAL SUSPENSION
25.0000 mg | Freq: Two times a day (BID) | ORAL | Status: DC
  Administered 2013-02-27 (×2): 25 mg via ORAL
  Filled 2013-02-27 (×4): qty 10

## 2013-02-27 MED ORDER — METHYLPREDNISOLONE SODIUM SUCC 40 MG IJ SOLR
20.0000 mg | Freq: Two times a day (BID) | INTRAMUSCULAR | Status: DC
  Administered 2013-02-27: 20 mg via INTRAVENOUS
  Filled 2013-02-27 (×2): qty 0.5

## 2013-02-27 MED ORDER — LEVALBUTEROL HCL 0.63 MG/3ML IN NEBU
0.6300 mg | INHALATION_SOLUTION | Freq: Four times a day (QID) | RESPIRATORY_TRACT | Status: DC
  Administered 2013-02-27 – 2013-02-28 (×3): 0.63 mg via RESPIRATORY_TRACT
  Filled 2013-02-27 (×7): qty 3

## 2013-02-27 MED ORDER — FENTANYL CITRATE 0.05 MG/ML IJ SOLN: 50.0000 ug | Freq: Once | INTRAMUSCULAR | Status: DC

## 2013-02-27 MED ORDER — FENTANYL BOLUS VIA INFUSION
50.0000 ug | INTRAVENOUS | Status: DC | PRN
  Filled 2013-02-27: qty 100

## 2013-02-27 MED ORDER — HEPARIN SODIUM (PORCINE) 5000 UNIT/ML IJ SOLN
5000.0000 [IU] | Freq: Three times a day (TID) | INTRAMUSCULAR | Status: DC
  Administered 2013-02-27 (×2): 5000 [IU] via SUBCUTANEOUS
  Filled 2013-02-27 (×6): qty 1

## 2013-02-27 MED ORDER — METOPROLOL TARTRATE 1 MG/ML IV SOLN: 5.0000 mg | Freq: Once | INTRAVENOUS | Status: DC

## 2013-02-27 MED ORDER — HYDROCORTISONE SOD SUCCINATE 100 MG IJ SOLR
100.0000 mg | Freq: Four times a day (QID) | INTRAMUSCULAR | Status: DC
  Administered 2013-02-28: 100 mg via INTRAVENOUS
  Filled 2013-02-27 (×6): qty 2

## 2013-02-27 MED ORDER — METOPROLOL TARTRATE 1 MG/ML IV SOLN
INTRAVENOUS | Status: AC
  Filled 2013-02-27: qty 5

## 2013-02-27 MED ORDER — PERFLUTREN LIPID MICROSPHERE
4.0000 mL | INTRAVENOUS | Status: AC | PRN
  Filled 2013-02-27: qty 10

## 2013-02-27 MED ORDER — ASPIRIN 325 MG PO TABS
325.0000 mg | ORAL_TABLET | Freq: Every day | ORAL | Status: DC
  Administered 2013-02-27: 325 mg
  Filled 2013-02-27 (×2): qty 1

## 2013-02-27 MED ORDER — SODIUM CHLORIDE 0.9 % IV SOLN
10.0000 ug/h | INTRAVENOUS | Status: DC
  Filled 2013-02-27: qty 50

## 2013-02-27 NOTE — Progress Notes (Signed)
Name: [name] MRN: [id number] DOB: 05-31-1955  ELECTRONIC ICU PHYSICIAN NOTE  Problem:  Hyperkalemia despite ok uop   Intake/Output Summary (Last 24 hours) at 02/27/13 2046 Last data filed at 02/27/13 1900  Gross per 24 hour  Intake   4380 ml  Output   1605 ml  Net   2775 ml     CVP:  [9 mmHg-18 mmHg] 17 mmHg    Intervention:  Change solumedrol to hydrocortisone to get more mineralocorticoid effect Now that uop ok and cvp well above target will reduce fluids to [employer] 02/27/2013, 8:46 PM

## 2013-02-27 NOTE — Progress Notes (Signed)
CRITICAL VALUE ALERT  Critical value received:  Troponin = 0.57  Date of notification: 12-120-14  Time of notification: 2150  Critical value read back:yes  Nurse who received alert:  Corliss Skains RN  MD notified (1st page):  Dr. Sherene Sires  Time of first page:  2150  MD notified (2nd page):  Time of second page:  Responding MD:  Dr. Sherene Sires  Time MD responded:  2151

## 2013-02-27 NOTE — H&P (Signed)
PULMONARY  / CRITICAL CARE MEDICINE  Name: [name] MRN: [id number] DOB: 1955-10-24    ADMISSION DATE:  02/22/2013 CONSULTATION DATE:  02/25/2013  REFERRING MD :  Transfer from [facility] PRIMARY SERVICE:  PCCM  CHIEF COMPLAINT:  Acute respiratory failure  BRIEF PATIENT DESCRIPTION: 57 yo with past medical history of COPD, OSA who presented to [facility] ED on 12/06 and was intubated for acute respiratory failure.  Course was complicated by acute renal failure, aspiration and pseudomembranous colitis.  Transferred to [facility] 12/18 for further management.  SIGNIFICANT EVENTS / STUDIES:  12/06  Presented to [facility] with acute respiratory failure, intubated 12/10  Renal US >>> medical renal disease, no hydronephrosis 12/11  TTE >>> LVH, EF 65-70, grade 1 diastolic dysfunction 12/12  CTA chest >>> no PE, stenosis of the distal trachea and bilateral mainstem bronchi, emphysema 12/18  Transferred to [facility] 12/19 - volume given, urine noted 12/19 ct abdo>>>>Mild apparent wall thickening in the rectosigmoid colon with liquid stool throughout the colon. Small amount of free fluid in the abdomen and pelvis. Suspect nodular contours of the liver suggesting possible cirrhosis. 12/20 - nonsust VT  LINES / TUBES:  OETT 12/06 >>> OGT 12/06 >>> Foley 12/06 >>> L PICC ??? >>>  CULTURES: 12/07 MRSA PCR >>> neg 12/09 Urine >>> neg 12/16 Respiratory >>> Candida few 12/17  C.dif PCR >>> POSITIVE  ANTIBIOTICS: Unasyn 12/16 >>>12/19 Flagyl 12/17 >>> vanc oral 12/19>>> Enema vanc 12/19>>>  INTERVAL HISTORY: no pressors, ct was done  VITAL SIGNS: Temp:  [98 F (36.7 C)-99.1 F (37.3 C)] 99.1 F (37.3 C) (12/20 0800) Pulse Rate:  [98-118] 118 (12/20 1000) Resp:  [17-23] 22 (12/20 1000) BP: (117-143)/(49-65) 140/59 mmHg (12/20 1000) SpO2:  [93 %-100 %] 93 % (12/20 1000) FiO2 (%):  [40 %-50 %] 40 % (12/20 0911) Weight:  [108.3 kg (238 lb 12.1 oz)] 108.3 kg (238 lb 12.1 oz) (12/20 0500) HEMODYNAMICS: CVP:   [6 mmHg-18 mmHg] 10 mmHg VENTILATOR SETTINGS: Vent Mode:  [-] PRVC FiO2 (%):  [40 %-50 %] 40 % Set Rate:  [18 bmp] 18 bmp Vt Set:  [650 mL] 650 mL PEEP:  [5 cmH20] 5 cmH20 Plateau Pressure:  [18 cmH20-21 cmH20] 18 cmH20 INTAKE / OUTPUT: Intake/Output     12/19 0701 - 12/20 0700 12/20 0701 - 12/21 0700   I.V. (mL/kg) 3955.7 (36.5) 525 (4.8)   NG/GT 1030    IV Piggyback 560    Total Intake(mL/kg) 5545.7 (51.2) 525 (4.8)   Urine (mL/kg/hr) 1280 (0.5) 250 (0.6)   Emesis/NG output     Stool 600 (0.2)    Total Output 1880 250   Net +3665.7 +275         PHYSICAL EXAMINATION: General: mechanically ventilated, synchronous Neuro:  rass -3, does not follow commands HEENT:  PERRL, OETT Cardiovascular:  RRR, no m/r/g Lungs:  Slight coarse , clear over all Abdomen:  Soft, nontender, bowel sounds diminished, distention unchanged Musculoskeletal:  Moves all extremities, increasing edema Skin:  Intact  LABS: CBC  Recent Labs Lab 02/25/13 0444 02/26/13 0405 02/27/13 0500  WBC 22.6* 20.4* 15.5*  HGB 14.7 14.0 13.8  HCT 47.0 43.0 40.4  PLT 219 166 140*   Coag's No results found for this basename: APTT, INR,  in the last 168 hours BMET  Recent Labs Lab 02/26/13 1428 02/26/13 2100 02/27/13 0500  NA 136 138 140  K 4.5 5.2* 5.2*  CL 102 100 101  CO2 18* 21 19  BUN 99*  120* 124*  CREATININE 2.98* 3.51* 3.41*  GLUCOSE 175* 189* 161*   Electrolytes  Recent Labs Lab 02/26/13 1428 02/26/13 2100 02/27/13 0500  CALCIUM 6.1* 7.5* 7.4*  MG  --  2.5  --   PHOS  --  7.2*  --    Sepsis Markers  Recent Labs Lab 02/26/13 0800  LATICACIDVEN 2.7*   ABG  Recent Labs Lab 02/25/13 0625 02/26/13 0500 02/27/13 0422  PHART 7.308* 7.344* 7.319*  PCO2ART 54.0* 42.6 40.6  PO2ART 79.4* 75.8* 76.0*   Liver Enzymes  Recent Labs Lab 02/27/13 0500  AST 200*  ALT 395*  ALKPHOS 193*  BILITOT 0.6  ALBUMIN 1.9*   Cardiac Enzymes  Recent Labs Lab 02/27/13 0733   TROPONINI 0.42*   Glucose  Recent Labs Lab 02/26/13 1150 02/26/13 1601 02/26/13 1959 02/27/13 0026 02/27/13 0404 02/27/13 0713  GLUCAP 208* 169* 173* 158* 155* 138*    CXR:  12/20- no infiltratrates, ett wnl  ASSESSMENT / PLAN:  PULMONARY A:  Acute on chronic respiratory failure.  Failure to wean.  COPD exacerbation. Suspected aspiration pneumonia/ pneumonitis. 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 H/o OSA P:   ABG reviewed, increase rate SBT attempt will perform unlikely to extubate  Prior to sbt , will abg , some agonal resp noted , ensure not worse ph Xopenex / Atrovent Decrease Solu-Medrol to reduce to 20 mg Bronchoscopy for airway assessment, pre extubation will consider   CARDIOVASCULAR A: Acute on chronic diastolic heart failure. HTN., ST no fib, int nonsust VT, hypovolemia P:  Goal MAP>60 Labetalol PRN cvp to 10 , with pos 3 liters , to even goals Keep k greater 4, mag greater 2 Trop noted, nonspec, ecg unchanged flip t Echo 11th, repeat as limited Add asa Beta blocker if tolerated  RENAL A:  Acute on chronic renal failure. Clinically hypovolemic- supported for sure with data P:   NS@100 , maintain pos balance  kyxylate x 1 bmet to q12h  GASTROINTESTINAL A:  Pseudomembranous colitis. Ileus, ct with ?  cirrhosis on ct? P:   kub TF start trickle, vital Protonix for GI Px Avoid reglan with vt Ct abdo / pelvis  Reviewed, see id Avoid ppi Will need further work up Hep panel  HEMATOLOGIC A:  hemoconcetrated likely, dvt prevention, wbc resolving P:  Trend CBC, with cdiff , good to see wbc better Heparin for DVT Px Volume maintain asa  INFECTIOUS A:  Aspiration pneumonia?(unimpressed) Pseudomembranous colitis, r/o mega colon P:   Severe cdiff, dc unasyn Add oral vanc Add vanc enema Change flagyl to iv CT abdo/pelvis lactic  ENDOCRINE  A:  DM2. Hyperglycemia. P:   SSI  NEUROLOGIC A:  Acute encephalopathy. P:   Goal  RASS 0 to -1 need to lighten up Avoid benzo with liver dz? Daily WUA Propofol gtt  I have personally obtained history, examined patient, evaluated and interpreted laboratory and imaging results, reviewed medical records, formulated assessment / plan and placed orders.  CRITICAL CARE:  The patient is critically ill with multiple organ systems failure and requires high complexity decision making for assessment and support, frequent evaluation and titration of therapies, application of advanced monitoring technologies and extensive interpretation of multiple databases. Critical Care Time devoted to patient care services described in this note is 35 minutes.   Nelda Bucks., MD Pulmonary and Critical Care Medicine [facility] Pager: [phone]  02/27/2013, 10:53 AM

## 2013-02-27 NOTE — Progress Notes (Signed)
  Echocardiogram 2D Echocardiogram has been performed.  Georgian Co 02/27/2013, 3:31 PM

## 2013-02-27 NOTE — Progress Notes (Signed)
Dr. Sherene Sires notified of Temp. 101.7, ice packs to core. Patient on abx, acetaminophen not given and discontinue due to elevated liver enzymes. Will continue to monitor.  Corliss Skains RN

## 2013-02-27 NOTE — Progress Notes (Signed)
Dr. Craige Cotta made aware of frequent PVC's and frequent V-tach and labs resulted, no additional orders in place will continue to monitor.  Corliss Skains RN

## 2013-02-28 ENCOUNTER — Inpatient Hospital Stay (HOSPITAL_COMMUNITY): Payer: Medicare HMO

## 2013-02-28 LAB — POCT I-STAT 3, ART BLOOD GAS (G3+)
Bicarbonate: 14.8 mEq/L — ABNORMAL LOW (ref 20.0–24.0)
Patient temperature: 98.3
TCO2: 16 mmol/L (ref 0–100)
pCO2 arterial: 45.5 mmHg — ABNORMAL HIGH (ref 35.0–45.0)
pH, Arterial: 7.121 — CL (ref 7.350–7.450)
pO2, Arterial: 248 mmHg — ABNORMAL HIGH (ref 80.0–100.0)

## 2013-02-28 LAB — COMPREHENSIVE METABOLIC PANEL
AST: 909 U/L — ABNORMAL HIGH (ref 0–37)
Albumin: 1.5 g/dL — ABNORMAL LOW (ref 3.5–5.2)
CO2: 17 mEq/L — ABNORMAL LOW (ref 19–32)
Calcium: 7.4 mg/dL — ABNORMAL LOW (ref 8.4–10.5)
Creatinine, Ser: 3.9 mg/dL — ABNORMAL HIGH (ref 0.50–1.35)
GFR calc non Af Amer: 16 mL/min — ABNORMAL LOW (ref >90)
Sodium: 142 mEq/L (ref 135–145)
Total Bilirubin: 0.7 mg/dL (ref 0.3–1.2)
Total Protein: 4.6 g/dL — ABNORMAL LOW (ref 6.0–8.3)

## 2013-02-28 LAB — CBC
HCT: 40.5 % (ref 39.0–52.0)
Hemoglobin: 13.2 g/dL (ref 13.0–17.0)
MCH: 31.4 pg (ref 26.0–34.0)
Platelets: 94 10*3/uL — ABNORMAL LOW (ref 150–400)
RBC: 4.21 MIL/uL — ABNORMAL LOW (ref 4.22–5.81)
WBC: 20 10*3/uL — ABNORMAL HIGH (ref 4.0–10.5)

## 2013-02-28 LAB — HEPATITIS PANEL, ACUTE
HCV Ab: NEGATIVE
Hep A IgM: NONREACTIVE
Hep B C IgM: NONREACTIVE

## 2013-02-28 LAB — GLUCOSE, CAPILLARY
Glucose-Capillary: 152 mg/dL — ABNORMAL HIGH (ref 70–99)
Glucose-Capillary: 113 mg/dL — ABNORMAL HIGH (ref 70–99)

## 2013-02-28 LAB — BASIC METABOLIC PANEL
BUN: 135 mg/dL — ABNORMAL HIGH (ref 6–23)
GFR calc Af Amer: 18 mL/min — ABNORMAL LOW (ref >90)
GFR calc non Af Amer: 16 mL/min — ABNORMAL LOW (ref >90)
Potassium: 6.1 mEq/L — ABNORMAL HIGH (ref 3.5–5.1)

## 2013-02-28 LAB — LACTIC ACID, PLASMA: Lactic Acid, Venous: 7.4 mmol/L — ABNORMAL HIGH (ref 0.5–2.2)

## 2013-02-28 MED ORDER — SODIUM CHLORIDE 0.9 % IV SOLN
0.0300 [IU]/min | INTRAVENOUS | Status: DC
  Administered 2013-02-28: 0.03 [IU]/min via INTRAVENOUS
  Filled 2013-02-28: qty 2.5

## 2013-02-28 MED ORDER — LORAZEPAM 2 MG/ML IJ SOLN
1.0000 mg/h | INTRAVENOUS | Status: DC
  Administered 2013-02-28: 2 mg/h via INTRAVENOUS
  Filled 2013-02-28: qty 25

## 2013-02-28 MED ORDER — LORAZEPAM BOLUS VIA INFUSION
2.0000 mg | INTRAVENOUS | Status: DC | PRN
  Filled 2013-02-28: qty 5

## 2013-02-28 MED ORDER — SODIUM CHLORIDE 0.9 % IV BOLUS (SEPSIS)
1000.0000 mL | Freq: Once | INTRAVENOUS | Status: AC
  Administered 2013-02-28: 1000 mL via INTRAVENOUS

## 2013-02-28 MED ORDER — SODIUM CHLORIDE 0.9 % IV SOLN
2.0000 g | Freq: Once | INTRAVENOUS | Status: DC
  Administered 2013-02-28: 2 g via INTRAVENOUS
  Filled 2013-02-28: qty 20

## 2013-02-28 MED ORDER — NOREPINEPHRINE BITARTRATE 1 MG/ML IJ SOLN
2.0000 ug/min | INTRAVENOUS | Status: DC
  Administered 2013-02-28: 15 ug/min via INTRAVENOUS
  Filled 2013-02-28: qty 8

## 2013-02-28 MED ORDER — DEXTROSE 50 % IV SOLN
50.0000 mL | Freq: Once | INTRAVENOUS | Status: AC
  Administered 2013-02-28: 50 mL via INTRAVENOUS

## 2013-02-28 MED ORDER — NOREPINEPHRINE BITARTRATE 1 MG/ML IJ SOLN
2.0000 ug/min | INTRAVENOUS | Status: DC
  Filled 2013-02-28: qty 16

## 2013-02-28 MED ORDER — FENTANYL CITRATE 0.05 MG/ML IJ SOLN: 25.0000 ug/h | INTRAMUSCULAR | Status: DC

## 2013-02-28 MED ORDER — DEXTROSE 5 % IV SOLN
30.0000 ug/min | INTRAVENOUS | Status: DC
  Administered 2013-02-28: 200 ug/min via INTRAVENOUS
  Filled 2013-02-28: qty 2

## 2013-02-28 MED ORDER — DEXTROSE 50 % IV SOLN
INTRAVENOUS | Status: AC
  Filled 2013-02-28: qty 50

## 2013-02-28 MED ORDER — INSULIN ASPART 100 UNIT/ML IV SOLN: 10.0000 [IU] | Freq: Once | INTRAVENOUS | Status: DC

## 2013-02-28 MED ORDER — SODIUM BICARBONATE 8.4 % IV SOLN
INTRAVENOUS | Status: AC
  Filled 2013-02-28: qty 50

## 2013-02-28 MED ORDER — SODIUM BICARBONATE 8.4 % IV SOLN
100.0000 meq | Freq: Once | INTRAVENOUS | Status: AC
  Administered 2013-02-28: 100 meq via INTRAVENOUS
  Filled 2013-02-28: qty 100

## 2013-02-28 MED ORDER — FENTANYL BOLUS VIA INFUSION
50.0000 ug | INTRAVENOUS | Status: DC | PRN
  Filled 2013-02-28: qty 200

## 2013-03-01 MED FILL — Medication: Qty: 1 | Status: AC

## 2013-03-11 NOTE — Significant Event (Signed)
Pt has persistent hypotension in spite of fluid bolus.  On camera review his abdomen appeared significantly distended.  Ordered levophed, labs, and Abd xray.  Concern was for toxic megacolon in setting of C diff colitis.  Prior to these orders being implemented the pt developed cardiac arrest.  Coralyn Helling, MD 03-25-2013, 3:33 AM

## 2013-03-11 NOTE — Progress Notes (Signed)
Patient asystole, no respiration, pupils fixed and dilated, no pulse present. Family at the bedside. Strip in chart.  Corliss Skains RN

## 2013-03-11 NOTE — Significant Event (Signed)
Family has decide to withdraw ventilator support and implement comfort measures only.  Orders placed.  Coralyn Helling, MD 03/03/2013, 6:08 AM

## 2013-03-11 NOTE — Progress Notes (Signed)
Spoke with sister.  She has discussed with family and they would like him to be DNR, no further CPR.  She is on her way to the hospital now.

## 2013-03-11 NOTE — Procedures (Signed)
Extubation Procedure Note  Patient Details:   Name: [name] DOB: 01-05-1956 MRN: [id number]   Airway Documentation:  Airway 8 mm (Active)  Secured at (cm) 25 cm 02/25/2013  7:59 AM  Measured From Lips 02/25/2013  7:59 AM  Secured Location Center 02/25/2013  7:59 AM  Secured By [employer] 02/25/2013  7:59 AM  Tube Holder Repositioned Yes 02/25/2013  7:59 AM  Cuff Pressure (cm H2O) 24 cm H2O 02/25/2013  7:59 AM  Site Condition Dry 02/25/2013  7:59 AM    Evaluation  O2 sats: currently acceptable Complications: No apparent complications Patient did tolerate procedure well. Bilateral Breath Sounds: Diminished Suctioning: Airway No  Patient terminally extubated with family present at bedside.  Pt ETT suctioned and oral suctioned prior to extubation.  RN gave oral care post-extubation.   Malachi Paradise Mar 23, 2013, 6:30 AM

## 2013-03-11 NOTE — Significant Event (Signed)
Pt hypotensive.  Will give 1 liter fluid bolus.  Coralyn Helling, MD 03/09/2013, 1:48 AM

## 2013-03-11 NOTE — Progress Notes (Signed)
03/03/2013 150 ml fent iv bag and 50 ml ativan iv bag wasted in sink with Algis Greenhouse.

## 2013-03-11 NOTE — Code Documentation (Signed)
CODE BLUE NOTE  Patient Name: [name]   MRN: [id number]   Date of Birth/ Sex: [birth year] , male      Admission Date: 03-03-13  Attending Provider: Lonia Farber*  Primary Diagnosis: Respiratory failure, acute    Indication: Recent history includes intubation for acute respiratory failure, complicated by suspected aspiration pneumonia, acute renal failure, and pseudomembranous colitis.  Prior to Code Blue, patient was noted to be hypotensive with poor response to IV bolus, when he was noted to be unresponsive and pulseless. Code blue was subsequently called. At the time of arrival on scene, ACLS protocol was underway. Patient received Epinephrine, Bicarb and defibrillation. He regained spontaneous circulation after 12 inutes of ACLS. Family was contacted and his code status was changed to DNR.      Technical Description:  - CPR performance duration:  12 minutes  - Was defibrillation or cardioversion used? Yes - x 1 shock, at 100J  - Was external pacer placed? No  - Was patient intubated pre/post CPR? No - Previously on mechanical ventilator    Medications Administered: Y = Yes; Blank = No Amiodarone    Atropine    Calcium    Epinephrine  Y, x 2 doses  Lidocaine    Magnesium    Norepinephrine    Phenylephrine    Sodium bicarbonate  Y, x 1 amp  Vasopressin      Post CPR evaluation:  - Final Status - Was patient successfully resuscitated ? Yes - What is current rhythm? NSR - What is current hemodynamic status? Stable   Miscellaneous Information:  - Labs sent, including: BMET, ABG. Imaging - Abdominal Xray  - Primary team notified?  Yes  - Family Notified? Yes - contacted Sister, who is on her way. Changed status to DNR.  - Additional notes/ transfer status: none      In attendance at code:  Blima Ledger, MD - CCM fellow, Primary Team  Dede Query, MD - Internal Medicine, PGY-3  Saralyn Pilar, DO - Family Medicine, PGY-1 03/03/2013, 3:47  AM

## 2013-03-11 DEATH — deceased

## 2013-03-16 ENCOUNTER — Encounter: Payer: Medicare HMO | Admitting: Internal Medicine

## 2013-03-26 NOTE — Discharge Summary (Signed)
[name], ACKLIN NO.:  [id number]  MEDICAL RECORD NO.:  [id number]  LOCATION:  2M06C                        FACILITY:  [facility]  PHYSICIAN:  Raylene Miyamoto, MD DATE OF BIRTH:  Jan 06, 1956  DATE OF ADMISSION:  02/10/2013 DATE OF DISCHARGE:  02/23/2013                              DISCHARGE SUMMARY   DEATH SUMMARY  This is an unfortunate 58 year old male with past medical history of COPD, OSA who presented to the [facility] Emergency Room on December 6, intubated for acute respiratory failure.  Course was complicated there for acute renal failure, aspiration, and pseudomembranous colitis.  The patient on December 18, nearly 12 days later was transferred to [facility] for further management.  His hospital course and events and studies were the following, on December 6, presented to [facility] with acute respiratory failure requiring endotracheal intubation. On December 10, the patient had renal ultrasound showing medical renal disease.  No hydronephrosis.  On December 11, the patient had transthoracic echocardiogram, showing a hyperdynamic LV with an ejection fraction estimated to be 65% and grade 1 diastolic dysfunction.  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  On December 18, transferred to [facility] with worsening renal failure and clinical status overall, on Flagyl.  On December 19, the CT scan of the abdomen and pelvis ordered which showed mild apparent wall thickening in the rectosigmoid colon with liquid stool throughout the colon, small amount of free fluid in the abdomen and pelvis, suspected nodular contours of liver suggesting possible cirrhosis.  On December 20, the patient had some nonsustained ventricular tachycardia.  Immediately upon admission to the [facility], concerns  were __________ C. diff for which aggressive vanc oral and vanc was added to the Flagyl and we limited the Unasyn and discontinued it as there was no apparent upper respiratory tract or pneumonia to be considered and the risk benefit ratio would favor the aggressive treatment of the patient's C diff.  The patient was not requiring pressors on December 20.  Family members were updated and the CAT scan was performed as I described.  The patient's ventilatory needs were not high and again noted the patient was getting further volume resuscitation given the concerns of the C. diff.  CAT scan was reassuring at that time.  From a renal perspective, the patient's concerns were noted to be hypovolemia, supporting evidence was on the chart of the laboratory data.  The patient was getting frequent BMETs. Unfortunately, on the night of December 21, the patient declined in his hemodynamics abruptly, worsening abdominal exam abruptly.  The __________ physician, Dr. Reginia Forts evaluated the patient, had family discussions.  DNR was established and apparently __________ responded very appropriately with aggressive care, and unfortunately in spite of all these aggressive efforts and acute abdominal changes, the patient expired.  FINAL DIAGNOSES UPON DEATH: 1. Severe C. diff colitis, rule out perforation acutely on the day of     death. 2. Septic shock. 3. Acute renal failure. 4. Acute respiratory failure. 5. Chronic obstructive pulmonary disease.  Raylene Miyamoto, MD     DJF/MEDQ  D:  03/25/2013  T:  03/26/2013  Job:  [id number]

## 2013-12-03 IMAGING — RF DG ESOPHAGUS
12 of 20 series · 12 of 24 positions shown · non-contrast
Comparison: None.

FLUOROSCOPY TIME:  1 Min and 18 seconds.

CLINICAL DATA: Dysphagia. Mid epigastric pain and esophageal pain
after meals. History of dilatation earlier this year.

EXAM:
ESOPHOGRAM / BARIUM SWALLOW / BARIUM TABLET STUDY
TECHNIQUE: Combined double contrast and single contrast examination performed
using effervescent crystals, thick barium liquid, and thin barium
liquid. The patient was observed with fluoroscopy swallowing a 13mm
barium sulphate tablet.

[Series 1: run · 1 of 5 slices shown (1 of 12)]
[im 3/5]
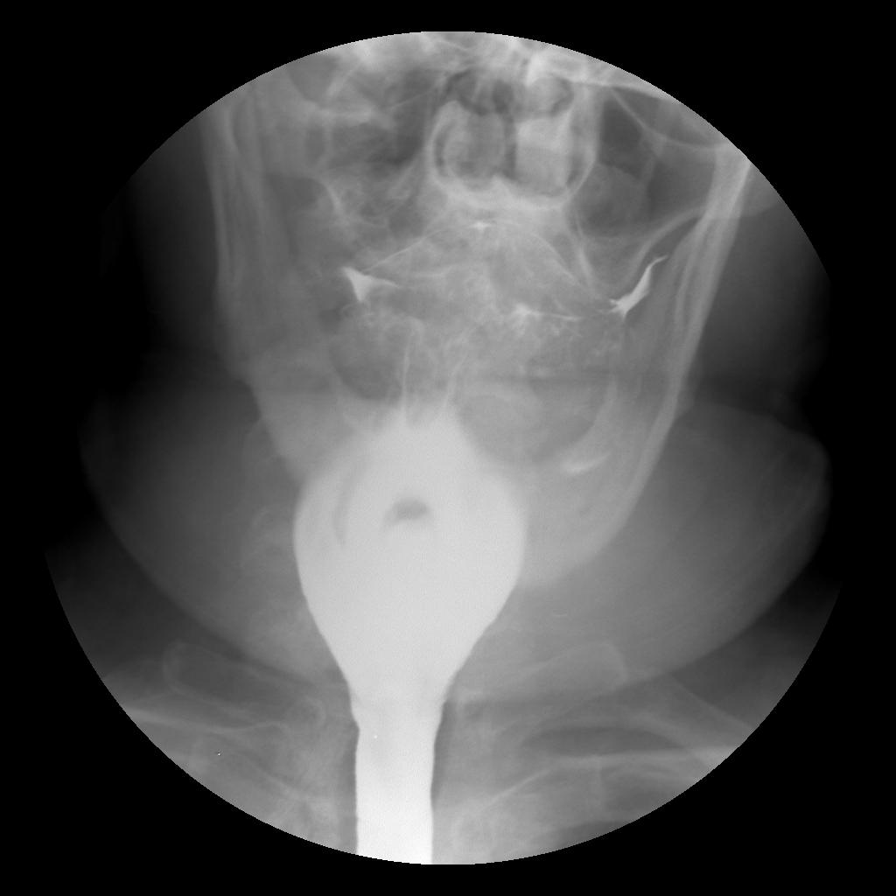

[Series 2: run · 1 of 1 slices shown (2 of 12)]
[im 1/1]
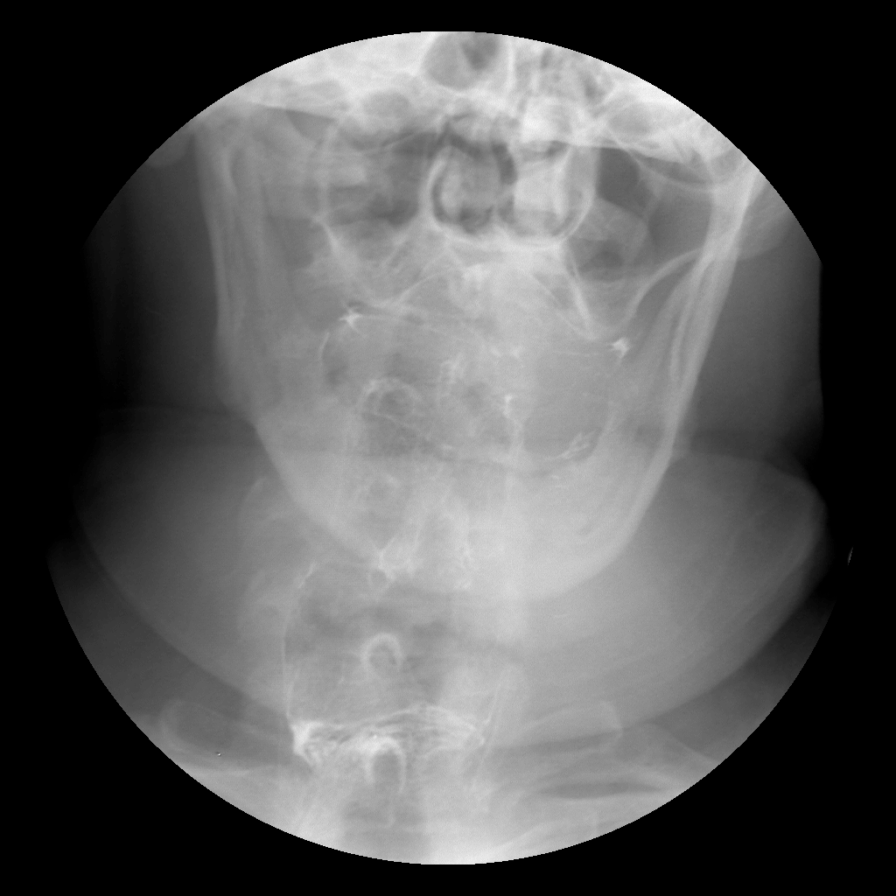

[Series 3: run · 1 of 4 slices shown (3 of 12)]
[im 2/4]
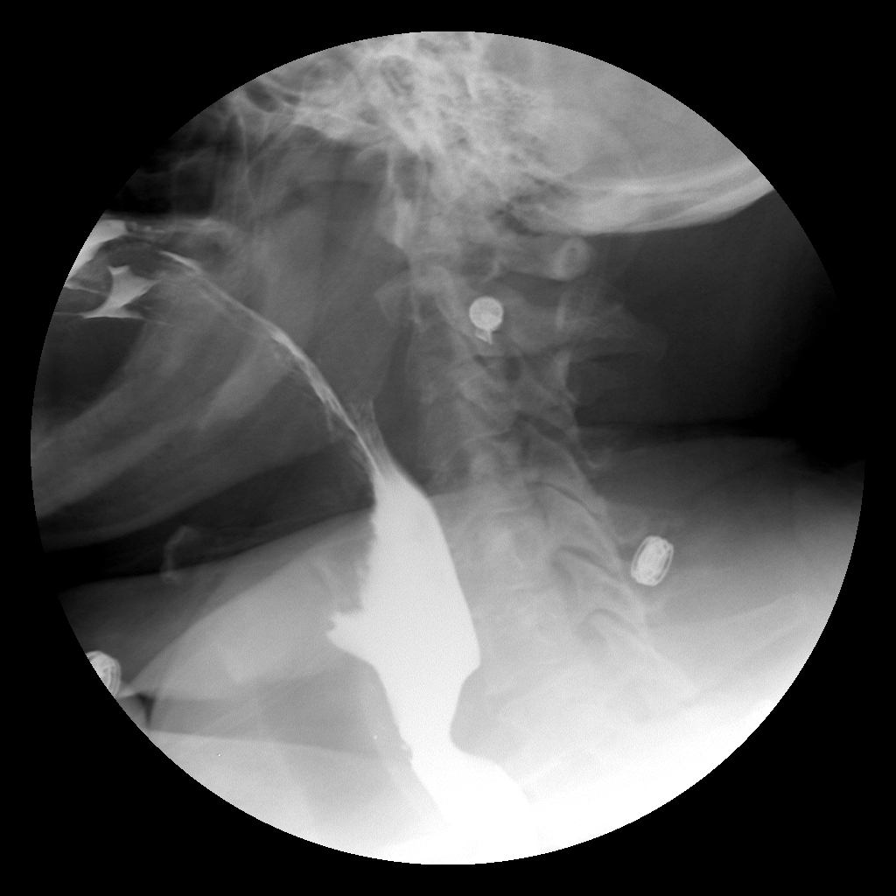

[Series 4: run · 1 of 1 slices shown (4 of 12)]
[im 1/1]
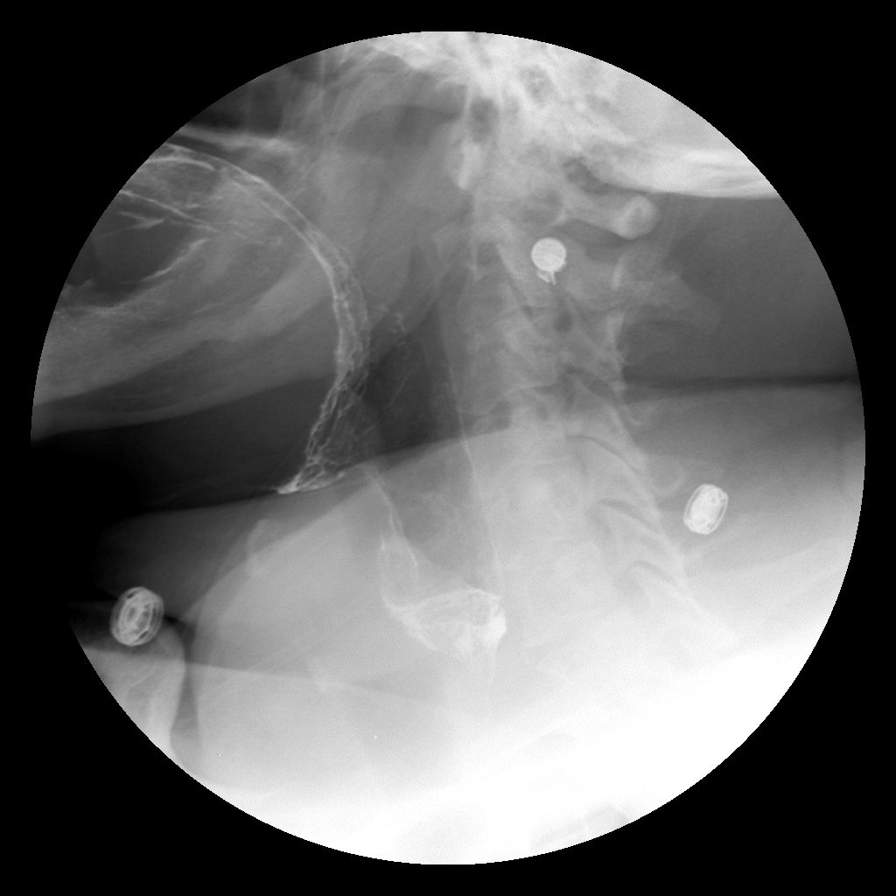

[Series 6: run · 1 of 1 slices shown (5 of 12)]
[im 1/1]
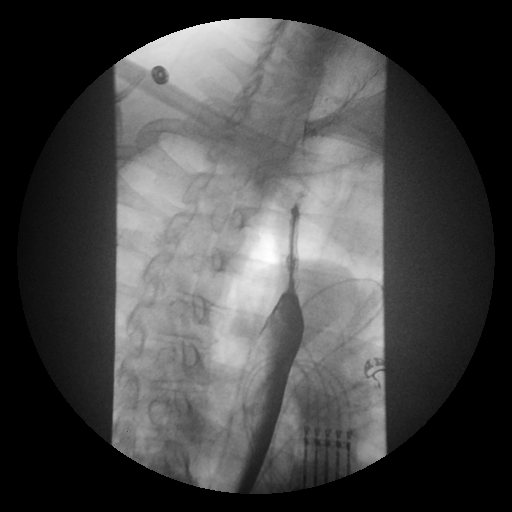

[Series 8: run · 1 of 1 slices shown (6 of 12)]
[im 1/1]
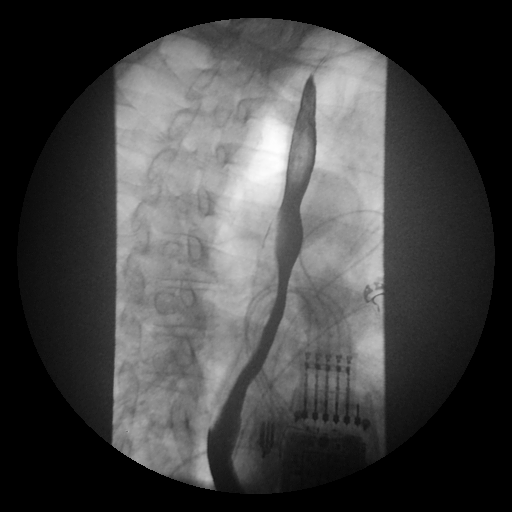

[Series 10: run · 1 of 1 slices shown (7 of 12)]
[im 1/1]
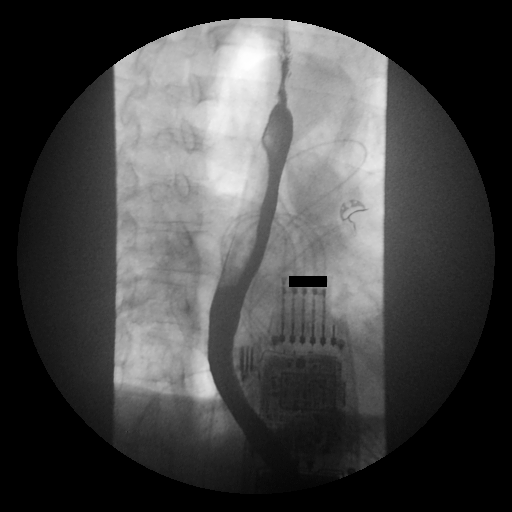

[Series 12: run · 1 of 1 slices shown (8 of 12)]
[im 1/1]
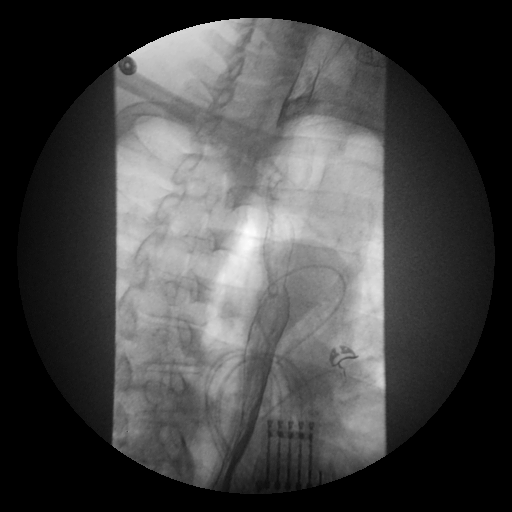

[Series 14: run · 1 of 1 slices shown (9 of 12)]
[im 1/1]
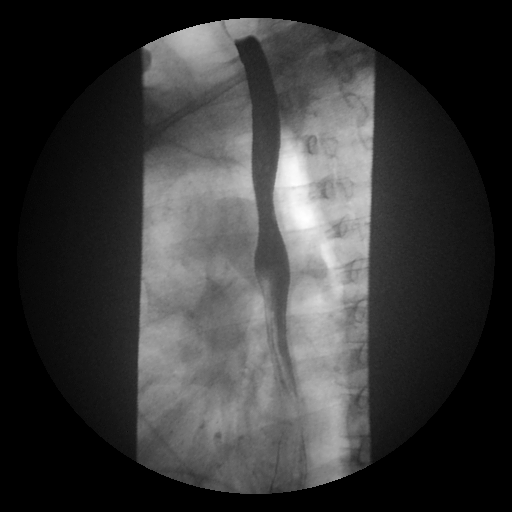

[Series 16: run · 1 of 1 slices shown (10 of 12)]
[im 1/1]
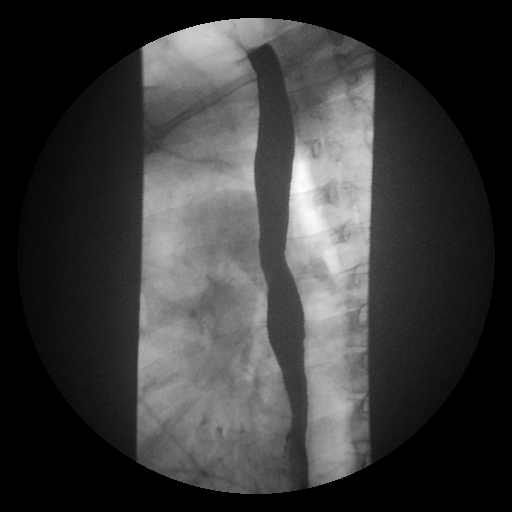

[Series 18: run · 1 of 1 slices shown (11 of 12)]
[im 1/1]
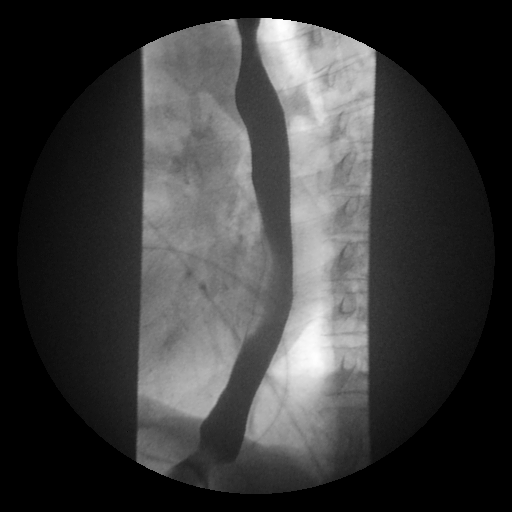

[Series 20: run · 1 of 1 slices shown (12 of 12)]
[im 1/1]
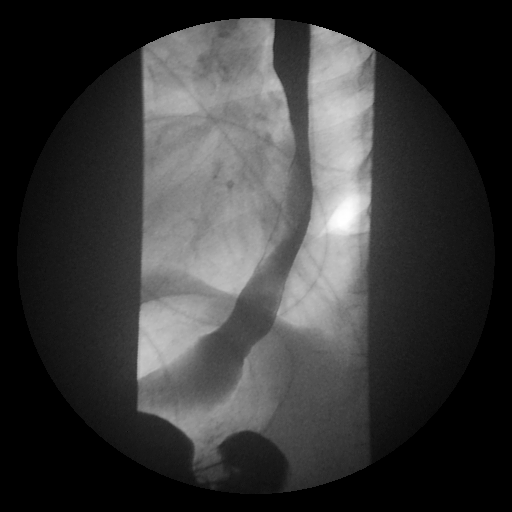

[12 of 24 positions shown; findings below may reference images not displayed]

FINDINGS: Swallowing mechanism is normal. There is poor primary esophageal
peristalsis. No esophageal fold thickening, stricture or
obstruction. A 13 mm barium pill passed into the stomach without
difficulty.
IMPRESSION: Esophageal dysmotility.
